# Patient Record
Sex: Male | Born: 1945 | Race: White | Hispanic: No | Marital: Married | Smoking: Former smoker
Health system: Southern US, Community
[De-identification: ages and names within clinical notes are randomized; demographics above are authoritative.]

## PROBLEM LIST (undated history)

## (undated) DIAGNOSIS — I2699 Other pulmonary embolism without acute cor pulmonale: Secondary | ICD-10-CM

## (undated) DIAGNOSIS — E119 Type 2 diabetes mellitus without complications: Secondary | ICD-10-CM

## (undated) DIAGNOSIS — C159 Malignant neoplasm of esophagus, unspecified: Secondary | ICD-10-CM

## (undated) HISTORY — PX: KNEE ARTHROPLASTY: SHX992

## (undated) HISTORY — DX: Type 2 diabetes mellitus without complications: E11.9

---

## 2011-09-18 ENCOUNTER — Encounter (HOSPITAL_COMMUNITY): Payer: Self-pay | Admitting: *Deleted

## 2011-09-18 ENCOUNTER — Emergency Department (HOSPITAL_COMMUNITY)
Admission: EM | Admit: 2011-09-18 | Discharge: 2011-09-18 | Disposition: A | Discharge status: Home / Self Care | Payer: Medicaid - Out of State | Attending: Emergency Medicine | Admitting: Emergency Medicine

## 2011-09-18 DIAGNOSIS — K047 Periapical abscess without sinus: Secondary | ICD-10-CM

## 2011-09-18 DIAGNOSIS — K044 Acute apical periodontitis of pulpal origin: Secondary | ICD-10-CM | POA: Insufficient documentation

## 2011-09-18 MED ORDER — CLINDAMYCIN HCL 150 MG PO CAPS: 300.0000 mg | ORAL_CAPSULE | Freq: Four times a day (QID) | ORAL | Status: AC

## 2011-09-18 MED ORDER — HYDROCODONE-ACETAMINOPHEN 5-325 MG PO TABS: ORAL_TABLET | ORAL | Status: AC

## 2011-09-18 MED ORDER — HYDROCODONE-ACETAMINOPHEN 5-325 MG PO TABS
1.0000 | ORAL_TABLET | Freq: Once | ORAL | Status: AC
  Administered 2011-09-18: 1 via ORAL
  Filled 2011-09-18: qty 1

## 2011-09-18 NOTE — ED Notes (Signed)
Patient has a missing upper left tooth which he has been told by a dentist in [place] (where he lives) that it is abscess. Pain is up in the gum. Noting some sinus drainage.

## 2011-09-18 NOTE — Discharge Instructions (Signed)
Please read and follow all provided instructions.  Your diagnoses today include:  1. Dental infection     Tests performed today include:  Vital signs. See below for your results today.   Medications prescribed:   Vicodin (hydrocodone/acetaminophen) - narcotic pain medication  You have been prescribed narcotic pain medication such as Vicodin, Percocet, or Ultram: DO NOT drive or perform any activities that require you to be awake and alert because this medicine can make you drowsy. BE VERY CAREFUL not to take multiple medicines containing Tylenol (also called acetaminophen). Doing so can lead to an overdose which can damage your liver and cause liver failure and possibly death.    Naproxen - anti-inflammatory pain medication  Do not exceed 500mg  naproxen every 12 hours  You have been prescribed an anti-inflammatory medication or NSAID. Take with food. Take smallest effective dose for the shortest duration needed for your pain. Stop taking if you experience stomach pain or vomiting.    Clindamycin - antibiotic for dental infection  You have been prescribed an antibiotic medicine: take the entire course of medicine even if you are feeling better. Stopping early can cause the antibiotic not to work.  Take any prescribed medications only as directed.  Home care instructions:  Follow any educational materials contained in this packet.  Follow-up instructions: Please follow-up with your dentist for further evaluation of your symptoms. If you do not have a dentist or primary care doctor -- see below for referral information.   The exam and treatment you received today has been provided on an emergency basis only. This is not a substitute for complete medical or dental care. If your problem worsens or new symptoms (problems) appear, and you are unable to arrange prompt follow-up care with your dentist, return to this location.  Return instructions:   Please return to the Emergency  Department if you experience worsening symptoms.  Please return if you develop a fever, you develop more swelling in your face or neck, you have trouble breathing or swallowing food.  Please return if you have any other emergent concerns.  Additional Information:  Your vital signs today were: BP 125/87  Pulse 98  Temp(Src) 98.8 F (37.1 C) (Oral)  Resp 20  SpO2 97% If your blood pressure (BP) was elevated above 135/85 this visit, please have this repeated by your doctor within one month. -------------- No Primary Care Doctor Call [facility]  [phone] Other agencies that provide inexpensive medical care    [facility]  [phone]    [facility]  [phone]    [facility]  [phone]    [facility]  [phone]    [facility]  [phone]    [facility]  [phone] -------------- RESOURCE GUIDE:  Dental Problems  Patients with Medicaid: [facility] [phone] [address]                                            [phone] [address] Phone:  [phone]  Phone:  [phone]  If unable to pay or uninsured, contact:  [facility] or [facility]. to become qualified for the adult dental clinic.  Chronic Pain Problems Contact [facility]  [phone] Patients need to be referred by their primary care doctor.  Insufficient Money for Medicine Contact [employer]:  call "211" or [facility] [phone].  Psychological Services [facility]  [phone] [facility]  [phone] [facility]   [phone] (emergency services [phone])  Substance Abuse Resources [facility]  [phone] [facility] [phone] [facility] [phone] Floydene Flock [phone] Residential & Outpatient Substance Abuse Program   [phone]  Abuse/Neglect [facility] Child Abuse Hotline 772-706-3854 [facility] Child Abuse Hotline 210-307-6245 (After Hours)  Emergency Shelter [facility] [phone]  Maternity Homes [facility] at the [place] [facility] [phone] [place] [facility] [phone]  [facility] Resources  [facility]     [employer]                          [facility] Dept. [address] [place]                       [address]      [address]  Blondell Reveal Phone:  [phone]                                   Phone:  [phone]                 Phone:  [phone]  [facility] Phone:  [phone]  [facility] Child Abuse Hotline [phone] (After Hours)

## 2011-09-18 NOTE — ED Provider Notes (Signed)
History     CSN: [id number]  Arrival date & time 09/18/11  1007   First MD Initiated Contact with Patient 09/18/11 1041      Chief Complaint  Patient presents with  . Dental Pain    left upper tooth    (Consider location/radiation/quality/duration/timing/severity/associated sxs/prior treatment) HPI Comments: L 2nd incisor pain x 2 days. Pt reports mild facial swelling. Taking ibuprofen without relief.   Patient is a 66 y.o. male presenting with tooth pain. The history is provided by the patient.  Dental PainPrimary symptoms do not include mouth pain, headaches, fever, shortness of breath or sore throat. The symptoms began 2 days ago. The symptoms are unchanged. The symptoms are new.  Additional symptoms include: facial swelling. Additional symptoms do not include: trouble swallowing and ear pain.    History reviewed. No pertinent past medical history.  History reviewed. No pertinent past surgical history.  No family history on file.  History  Substance Use Topics  . Smoking status: Not on file  . Smokeless tobacco: Not on file  . Alcohol Use: Not on file      Review of Systems  Constitutional: Negative for fever.  HENT: Positive for facial swelling and dental problem. Negative for ear pain, sore throat, trouble swallowing and neck pain.   Respiratory: Negative for shortness of breath and stridor.   Skin: Negative for color change.  Neurological: Negative for headaches.    Allergies  Penicillins and Sulfa antibiotics  Home Medications   Current Outpatient Rx  Name Route Sig Dispense Refill  . IBUPROFEN 800 MG PO TABS Oral Take 800 mg by mouth every 8 (eight) hours as needed.      BP 125/87  Pulse 98  Temp(Src) 98.8 F (37.1 C) (Oral)  Resp 20  SpO2 97%  Physical Exam  Nursing note and vitals reviewed. Constitutional: He is oriented to person, place, and time. He appears well-developed and well-nourished.  HENT:  Head: Normocephalic and atraumatic. No  trismus in the jaw.  Right Ear: Tympanic membrane, external ear and ear canal normal.  Left Ear: Tympanic membrane, external ear and ear canal normal.  Nose: Nose normal.  Mouth/Throat: Uvula is midline, oropharynx is clear and moist and mucous membranes are normal. Abnormal dentition. Dental caries present. No dental abscesses or uvula swelling. No tonsillar abscesses.       Patient with L maxillary tooth pain and tenderness to palpation in area of 2nd incisior. Tooth is broken. Mild  swelling or erythema noted of gums on exam. No gross facial swelling.  Eyes: Pupils are equal, round, and reactive to light.  Neck: Normal range of motion. Neck supple.       No neck swelling or Lugwig's angina  Neurological: He is alert and oriented to person, place, and time.  Skin: Skin is warm and dry.  Psychiatric: He has a normal mood and affect.    ED Course  Procedures (including critical care time)  Labs Reviewed - No data to display No results found.   1. Dental infection     11:11 AM Patient seen and examined. Medications ordered.   Vital signs reviewed and are as follows: Filed Vitals:   09/18/11 1012  BP: 125/87  Pulse: 98  Temp: 98.8 F (37.1 C)  Resp: 20   11:26 AM Patient counseled to take prescribed medications as directed, return with worsening facial or neck swelling, and to follow-up with his dentist as soon as possible.   11:26 AM Patient counseled on  use of narcotic pain medications. Counseled not to combine these medications with others containing tylenol. Urged not to drink alcohol, drive, or perform any other activities that requires focus while taking these medications. The patient verbalizes understanding and agrees with the plan.    MDM  Patient with toothache. Likley small abscess.  Exam unconcerning for Ludwig's angina or other deep tissue infection in neck.  Will treat with clinda (pen allergy) and pain medicine.  Urged patient to follow-up with dentist.            Renne Crigler, PA 09/18/11 1126

## 2011-09-18 NOTE — ED Provider Notes (Signed)
Medical screening examination/treatment/procedure(s) were performed by non-physician practitioner and as supervising physician I was immediately available for consultation/collaboration.  Vasilis Luhman, MD 09/18/11 1521 

## 2014-04-10 DIAGNOSIS — Z23 Encounter for immunization: Secondary | ICD-10-CM | POA: Diagnosis not present

## 2015-04-21 DIAGNOSIS — Z23 Encounter for immunization: Secondary | ICD-10-CM | POA: Diagnosis not present

## 2015-06-15 ENCOUNTER — Encounter: Payer: Self-pay | Admitting: Internal Medicine

## 2015-06-15 ENCOUNTER — Ambulatory Visit (INDEPENDENT_AMBULATORY_CARE_PROVIDER_SITE_OTHER): Payer: Medicare Other | Admitting: Internal Medicine

## 2015-06-15 VITALS — BP 137/74 | HR 64 | Temp 98.2°F | Ht 67.0 in | Wt 214.3 lb

## 2015-06-15 DIAGNOSIS — Z87891 Personal history of nicotine dependence: Secondary | ICD-10-CM | POA: Diagnosis not present

## 2015-06-15 DIAGNOSIS — Z7189 Other specified counseling: Secondary | ICD-10-CM

## 2015-06-15 DIAGNOSIS — Z0000 Encounter for general adult medical examination without abnormal findings: Secondary | ICD-10-CM

## 2015-06-15 DIAGNOSIS — L409 Psoriasis, unspecified: Secondary | ICD-10-CM | POA: Diagnosis not present

## 2015-06-15 DIAGNOSIS — Z1212 Encounter for screening for malignant neoplasm of rectum: Secondary | ICD-10-CM

## 2015-06-15 DIAGNOSIS — Z7689 Persons encountering health services in other specified circumstances: Secondary | ICD-10-CM

## 2015-06-15 DIAGNOSIS — Z6833 Body mass index (BMI) 33.0-33.9, adult: Secondary | ICD-10-CM

## 2015-06-15 DIAGNOSIS — Z794 Long term (current) use of insulin: Secondary | ICD-10-CM | POA: Diagnosis not present

## 2015-06-15 DIAGNOSIS — E119 Type 2 diabetes mellitus without complications: Secondary | ICD-10-CM

## 2015-06-15 DIAGNOSIS — E663 Overweight: Secondary | ICD-10-CM

## 2015-06-15 DIAGNOSIS — R7303 Prediabetes: Secondary | ICD-10-CM | POA: Insufficient documentation

## 2015-06-15 DIAGNOSIS — Z1211 Encounter for screening for malignant neoplasm of colon: Secondary | ICD-10-CM

## 2015-06-15 LAB — POCT GLYCOSYLATED HEMOGLOBIN (HGB A1C): HEMOGLOBIN A1C: 6

## 2015-06-15 LAB — GLUCOSE, CAPILLARY: Glucose-Capillary: 122 mg/dL — ABNORMAL HIGH (ref 65–99)

## 2015-06-15 NOTE — Progress Notes (Signed)
Subjective:   Patient ID: Samuel [name] male   DOB: [birth year] 69 y.o.   MRN: [id number]  HPI: Mr.Samuel [name] is a 69 y.o. man presenting to the clinic today to establish care with a new provider. He is currently feeling well today. His past medical history significant for type 2 diabetes mellitus previously on metformin, bilateral total knee arthroplasty, and intermittent plaque psoriasis. As of now he is taking only over-the-counter supplements and occasional ibuprofen as needed. He was previously followed at treating healthcare system in [place], [place], but has moved out here due to family.  See problem based assessment and plan below for additional details.  Past Medical History  Diagnosis Date  . Diabetes mellitus without complication (HCC)    No current outpatient prescriptions on file.   No current facility-administered medications for this visit.   Family History  Problem Relation Age of Onset  . Hypertension Father   . COPD Father   . COPD Sister   . Diabetes Maternal Grandfather    Social History   Social History  . Marital Status: Divorced    Spouse Name: N/A  . Number of Children: N/A  . Years of Education: N/A   Social History Main Topics  . Smoking status: Former Smoker -- 1.00 packs/day for 15 years    Types: Cigarettes    Quit date: 06/14/1985  . Smokeless tobacco: None  . Alcohol Use: 0.6 - 1.2 oz/week    1-2 Cans of beer per week  . Drug Use: No  . Sexual Activity: Not Currently   Other Topics Concern  . None   Social History Narrative   Review of Systems: Review of Systems  Constitutional: Negative for fever, chills and weight loss.  HENT: Negative for sore throat.   Eyes: Negative for blurred vision and double vision.  Respiratory: Negative for cough and shortness of breath.   Cardiovascular: Negative for chest pain and leg swelling.  Gastrointestinal: Negative for diarrhea and blood in stool.  Genitourinary: Negative for dysuria and frequency.   Musculoskeletal: Negative for falls.  Skin: Positive for itching and rash.  Neurological: Negative for dizziness and headaches.  Endo/Heme/Allergies: Negative for environmental allergies.  Psychiatric/Behavioral: Negative for substance abuse.    Objective:  Physical Exam: Filed Vitals:   06/15/15 0838  BP: 137/74  Pulse: 64  Temp: 98.2 F (36.8 C)  TempSrc: Oral  Height: 5\' 7"  (1.702 m)  Weight: 214 lb 4.8 oz (97.206 kg)  SpO2: 97%   GENERAL- alert, co-operative, NAD HEENT- Atraumatic, PERRL, EOMI, oral mucosa appears moist, few teeth missing with multiple crowns, no carotid bruit, no cervical LN enlargement. CARDIAC- RRR, no murmurs, rubs or gallops. RESP- CTAB, no wheezes or crackles. ABDOMEN- Soft, nontender, no guarding or rebound, normoactive bowel sounds present BACK- Normal curvature, no paraspinal tenderness, no CVA tenderness. NEURO- No obvious Cr N abnormality, strength upper and lower extremities- 5/5, Sensation intact globally EXTREMITIES- pulse 2+, symmetric, no pedal edema, no callus or lesion on feet SKIN- Small patch of approximately 4cm scaly plaque on lateral left ankle PSYCH- Normal mood and affect, appropriate thought content and speech.  Assessment & Plan:

## 2015-06-15 NOTE — Assessment & Plan Note (Addendum)
Patient newly establishing care we will check a baseline set of labs today. On review with his personally reported records he is currently up-to-date on all inoculations. He is not a candidate for lung cancer screening due to remoteness of his smoking history. He has not undergone any form of colorectal cancer screening but was interested in this and had previously been referred for colonoscopy before moving from [place]. He is interested in Hemoccult cards at this visit and these are provided.  He also reports a history of mild complaints about short term memory and trouble multitasking, but his symptoms are not severe enough to prevent work and regular activities. We can assess for quantifiable deficit on mental exam at next visit.   He is also overweight and this will be important to address with a history of diabetes, although to date he has not had any known significant complications.

## 2015-06-15 NOTE — Assessment & Plan Note (Signed)
Assessment: Patient has a history of type 2 diabetes mellitus and was previously taking metformin daily. However he has not been taking metformin for greater than 1 year. Does not check blood glucoses at home. Risk factors include family history and overweight. He is not have any characteristic symptoms of polydipsia, urinary frequency, headache, fatigue, vision changes, or peripheral neuropathy. Foot exam was unremarkable with no callus or skin changes and intact sensation throughout.  Plan: Hemoglobin A1c today

## 2015-06-15 NOTE — Assessment & Plan Note (Signed)
He has intermittent plaques psoriasis with current symptoms on his left shin and a small area that is currently improving. He does not have a known history of associated inflammatory arthritis. He does not currently take any treatment for this. He does think his psoriasis has been less symptomatic since starting on all his dietary supplements.

## 2015-06-15 NOTE — Patient Instructions (Signed)
Today we are testing some blood work for your hemoglobin level, your kidney function, and your hemoglobin A1c, which is a surrogate marker for your average blood sugar over the past 3 months. If any of these results are concerning or abnormal we will call you with this information. Otherwise we will plan to follow up in about 1-2 months to make sure you are doing well.  Your hemoccult cards are a sensitive test for blood in your stool. There are many causes of blood in stool but colorectal cancer is the most concerning of these. Follow the enclosed instructions and you can provide these cards back to us at your next visit or sooner if you wish to.  If you have any questions or need to be seen for a new symptoms please call our clinic at [phone].

## 2015-06-16 LAB — BMP8+ANION GAP
ANION GAP: 16 mmol/L (ref 10.0–18.0)
BUN/Creatinine Ratio: 13 (ref 10–22)
BUN: 14 mg/dL (ref 8–27)
CALCIUM: 9.5 mg/dL (ref 8.6–10.2)
CHLORIDE: 102 mmol/L (ref 96–106)
CO2: 23 mmol/L (ref 18–29)
Creatinine, Ser: 1.05 mg/dL (ref 0.76–1.27)
GFR calc non Af Amer: 72 mL/min/{1.73_m2} (ref >59)
GFR, EST AFRICAN AMERICAN: 83 mL/min/{1.73_m2} (ref >59)
GLUCOSE: 117 mg/dL — AB (ref 65–99)
POTASSIUM: 4.6 mmol/L (ref 3.5–5.2)
Sodium: 141 mmol/L (ref 134–144)

## 2015-06-16 LAB — CBC
HEMATOCRIT: 49.3 % (ref 37.5–51.0)
HEMOGLOBIN: 16.5 g/dL (ref 12.6–17.7)
MCH: 30 pg (ref 26.6–33.0)
MCHC: 33.5 g/dL (ref 31.5–35.7)
MCV: 90 fL (ref 79–97)
Platelets: 263 10*3/uL (ref 150–379)
RBC: 5.5 x10E6/uL (ref 4.14–5.80)
RDW: 13.5 % (ref 12.3–15.4)
WBC: 9.2 10*3/uL (ref 3.4–10.8)

## 2015-06-18 NOTE — Progress Notes (Signed)
Internal Medicine Clinic Attending  I saw and evaluated the patient.  I personally confirmed the key portions of the history and exam documented by Dr. Rice and I reviewed pertinent patient test results.  The assessment, diagnosis, and plan were formulated together and I agree with the documentation in the resident's note.  

## 2016-04-06 DIAGNOSIS — Z23 Encounter for immunization: Secondary | ICD-10-CM | POA: Diagnosis not present

## 2017-03-13 DIAGNOSIS — Z23 Encounter for immunization: Secondary | ICD-10-CM | POA: Diagnosis not present

## 2017-03-28 DIAGNOSIS — Z23 Encounter for immunization: Secondary | ICD-10-CM | POA: Diagnosis not present

## 2021-02-12 ENCOUNTER — Encounter (HOSPITAL_COMMUNITY): Payer: Self-pay | Admitting: Emergency Medicine

## 2021-02-12 ENCOUNTER — Other Ambulatory Visit: Payer: Self-pay

## 2021-02-12 ENCOUNTER — Ambulatory Visit (INDEPENDENT_AMBULATORY_CARE_PROVIDER_SITE_OTHER): Payer: Medicare Other

## 2021-02-12 ENCOUNTER — Telehealth (HOSPITAL_COMMUNITY): Payer: Self-pay | Admitting: Emergency Medicine

## 2021-02-12 ENCOUNTER — Ambulatory Visit (HOSPITAL_COMMUNITY)
Admission: EM | Admit: 2021-02-12 | Discharge: 2021-02-12 | Disposition: A | Discharge status: Home / Self Care | Payer: Medicare Other | Attending: Student | Admitting: Student

## 2021-02-12 DIAGNOSIS — S42295A Other nondisplaced fracture of upper end of left humerus, initial encounter for closed fracture: Secondary | ICD-10-CM

## 2021-02-12 DIAGNOSIS — M25512 Pain in left shoulder: Secondary | ICD-10-CM

## 2021-02-12 DIAGNOSIS — W19XXXA Unspecified fall, initial encounter: Secondary | ICD-10-CM

## 2021-02-12 MED ORDER — HYDROCODONE-IBUPROFEN 7.5-200 MG PO TABS: 1.0000 | ORAL_TABLET | Freq: Four times a day (QID) | ORAL | 0 refills | Status: DC | PRN

## 2021-02-12 MED ORDER — TRAMADOL HCL 50 MG PO TABS: 50.0000 mg | ORAL_TABLET | Freq: Four times a day (QID) | ORAL | 0 refills | Status: DC | PRN

## 2021-02-12 NOTE — Discharge Instructions (Addendum)
-  You have a : Acute impacted appearing left humeral neck fracture without humeral head dislocation -Hydrocodone-ibuprofen for pain, up to every 6 hours as needed. This can cause drowsiness so do not drive after taking. Take with food. Avoid other products containing ibuprofen or NSAIDs.  -Leave your sling on at all times, but you can remove this 2-3 times a day to move your elbow and wrist.  Absolutely no heavy lifting or raising the arm. -Please call Dr. William Hamburger office ([facility]) on Monday 8/15 to schedule follow-up with them. They've already reviewed your case and expect your call.

## 2021-02-12 NOTE — ED Triage Notes (Signed)
Pt presents with left shoulder pain after a fall yesterday.

## 2021-02-12 NOTE — ED Provider Notes (Addendum)
[facility]    CSN: [id number] Arrival date & time: 02/12/21  1258      History   Chief Complaint Chief Complaint  Patient presents with   Fall   Shoulder Injury    HPI Samuel [name] is a 75 y.o. male presenting with L shoulder pain following fall. Medical history diabetes. States Samuel [name] tripped and fell down few stairs and landed directly on the L shoulder 1 day ago. Now with pain and unable to move arm due to this. Denies sensation changes. Denies pain or injury elsewhere. Did not hit head, denies LOC, headaches, dizziness. Samuel [name] is not on a bloodthinner. Denies hip pain, change in bowel or bladder function, hematuria. Ibuprofen providing some relief.  HPI  Past Medical History:  Diagnosis Date   Diabetes mellitus without complication [facility])     Patient Active Problem List   Diagnosis Date Noted   Type 2 diabetes mellitus without complication, without long-term current use of insulin (HCC) 06/15/2015   Preventative health care 06/15/2015   Psoriasis 06/15/2015    Past Surgical History:  Procedure Laterality Date   KNEE ARTHROPLASTY Bilateral        Home Medications    Prior to Admission medications   Medication Sig Start Date End Date Taking? Authorizing Provider  HYDROcodone-ibuprofen (VICOPROFEN) 7.5-200 MG tablet Take 1 tablet by mouth every 6 (six) hours as needed for moderate pain. 02/12/21  Yes Rhys Martini, PA-C    Family History Family History  Problem Relation Age of Onset   Hypertension Father    COPD Father    COPD Sister    Diabetes Maternal Grandfather     Social History Social History   Tobacco Use   Smoking status: Former    Packs/day: 1.00    Years: 15.00    Pack years: 15.00    Types: Cigarettes    Quit date: 06/14/1985    Years since quitting: 35.6   Smokeless tobacco: Never  Substance Use Topics   Alcohol use: Yes    Alcohol/week: 1.0 - 2.0 standard drink    Types: 1 - 2 Cans of beer per week   Drug use: No      Allergies   Penicillins, Sulfa antibiotics, and Procaine   Review of Systems Review of Systems  Musculoskeletal:        L shoulder pain  All other systems reviewed and are negative.   Physical Exam Triage Vital Signs ED Triage Vitals  Enc Vitals Group     BP 02/12/21 1515 (!) 145/85     Pulse Rate 02/12/21 1515 98     Resp 02/12/21 1515 17     Temp 02/12/21 1515 98.9 F (37.2 C)     Temp Source 02/12/21 1515 Oral     SpO2 02/12/21 1515 96 %     Weight --      Height --      Head Circumference --      Peak Flow --      Pain Score 02/12/21 1511 10     Pain Loc --      Pain Edu? --      Excl. in GC? --    No data found.  Updated Vital Signs BP (!) 145/85 (BP Location: Right Arm)   Pulse 98   Temp 98.9 F (37.2 C) (Oral)   Resp 17   SpO2 96%   Visual Acuity Right Eye Distance:   Left Eye Distance:   Bilateral Distance:  Right Eye Near:   Left Eye Near:    Bilateral Near:     Physical Exam Vitals reviewed.  Constitutional:      General: Samuel [name] is not in acute distress.    Appearance: Normal appearance. Samuel [name] is not ill-appearing or diaphoretic.  HENT:     Head: Normocephalic and atraumatic.  Cardiovascular:     Rate and Rhythm: Normal rate and regular rhythm.     Heart sounds: Normal heart sounds.  Pulmonary:     Effort: Pulmonary effort is normal.     Breath sounds: Normal breath sounds.  Musculoskeletal:     Comments: L shoulder- exam limited due to pain. Significantly TTP over proximal humerus. Unable to abduct arm. No clavicular or AC joint abnormality. Sensation intact. No shoulder jointline tenderness. No elbow tenderrness, ROM intact and without pain. Minimal distal radius tendernes without deformity or ecchymosis. Grip strength 5/5, sensation intact, radial pulse 2+, cap refill <2 seconds, no snuffbox tenderness.   Absolutely no other injury, deformity, tenderness. No hip or pelvic instabilty.  Skin:    General: Skin is warm.  Neurological:      General: No focal deficit present.     Mental Status: Samuel [name] is alert and oriented to person, place, and time.  Psychiatric:        Mood and Affect: Mood normal.        Behavior: Behavior normal.        Thought Content: Thought content normal.        Judgment: Judgment normal.     UC Treatments / Results  Labs (all labs ordered are listed, but only abnormal results are displayed) Labs Reviewed - No data to display  EKG   Radiology DG Shoulder Left  Result Date: 02/12/2021 CLINICAL DATA:  Pain after fall EXAM: LEFT SHOULDER - 2+ VIEW COMPARISON:  None. FINDINGS: Moderate AC joint degenerative change. Acute impacted appearing humeral neck fracture. No humeral head dislocation. Advanced degenerative change of left shoulder with pedunculated osteophyte. IMPRESSION: Acute impacted appearing left humeral neck fracture without humeral head dislocation Electronically Signed   By: Jasmine Pang M.D.   On: 02/12/2021 15:50    Procedures Procedures (including critical care time)  Medications Ordered in UC Medications - No data to display  Initial Impression / Assessment and Plan / UC Course  I have reviewed the triage vital signs and the nursing notes.  Pertinent labs & imaging results that were available during my care of the patient were reviewed by me and considered in my medical decision making (see chart for details).     This patient is a very pleasant 75 y.o. year old male presenting with L humeral fracture following fall. Neurovascularly intact.   Xray L arm - Acute impacted appearing left humeral neck fracture without humeral head dislocation.  Placed in sling. Pt is allergic to tylenol. Tramadol for pain.  Spoke with PA Dion Saucier at [facility], who is in agreement to treat with sling and f/u with ortho in one week.   ED return precautions discussed. Patient verbalizes understanding and agreement.   Level 4 for acute complicated injury with prescription drug  management.  Final Clinical Impressions(s) / UC Diagnoses   Final diagnoses:  Other closed nondisplaced fracture of proximal end of left humerus, initial encounter     Discharge Instructions      -You have a : Acute impacted appearing left humeral neck fracture without humeral head dislocation -Hydrocodone-ibuprofen for pain, up to every 6 hours as needed.  This can cause drowsiness so do not drive after taking. Take with food. Avoid other products containing ibuprofen or NSAIDs.  -Leave your sling on at all times, but you can remove this 2-3 times a day to move your elbow and wrist.  Absolutely no heavy lifting or raising the arm. -Please call Dr. William Hamburger office ([facility]) on Monday 8/15 to schedule follow-up with them. They've already reviewed your case and expect your call.     ED Prescriptions     Medication Sig Dispense Auth. Provider   HYDROcodone-ibuprofen (VICOPROFEN) 7.5-200 MG tablet Take 1 tablet by mouth every 6 (six) hours as needed for moderate pain. 20 tablet Rhys Martini, PA-C      I have reviewed the PDMP during this encounter.   Rhys Martini, PA-C 02/12/21 1705    Rhys Martini, PA-C 02/12/21 1736

## 2021-02-12 NOTE — Telephone Encounter (Signed)
RX for Hydrocodone has been cancelled with [facility], pt notified Tramadol has been sent to pharmacy.

## 2021-07-13 DIAGNOSIS — S42202S Unspecified fracture of upper end of left humerus, sequela: Secondary | ICD-10-CM | POA: Diagnosis not present

## 2021-08-13 DIAGNOSIS — S42202S Unspecified fracture of upper end of left humerus, sequela: Secondary | ICD-10-CM | POA: Diagnosis not present

## 2021-09-10 DIAGNOSIS — S42202S Unspecified fracture of upper end of left humerus, sequela: Secondary | ICD-10-CM | POA: Diagnosis not present

## 2021-10-02 ENCOUNTER — Encounter (HOSPITAL_COMMUNITY): Payer: Self-pay

## 2021-10-02 ENCOUNTER — Ambulatory Visit (HOSPITAL_COMMUNITY)
Admission: EM | Admit: 2021-10-02 | Discharge: 2021-10-02 | Disposition: A | Discharge status: Home / Self Care | Payer: Medicare Other | Attending: Urgent Care | Admitting: Urgent Care

## 2021-10-02 ENCOUNTER — Ambulatory Visit (INDEPENDENT_AMBULATORY_CARE_PROVIDER_SITE_OTHER): Payer: Medicare Other

## 2021-10-02 DIAGNOSIS — S62655A Nondisplaced fracture of medial phalanx of left ring finger, initial encounter for closed fracture: Secondary | ICD-10-CM

## 2021-10-02 DIAGNOSIS — S62663A Nondisplaced fracture of distal phalanx of left middle finger, initial encounter for closed fracture: Secondary | ICD-10-CM | POA: Diagnosis not present

## 2021-10-02 DIAGNOSIS — S8001XA Contusion of right knee, initial encounter: Secondary | ICD-10-CM

## 2021-10-02 DIAGNOSIS — S62633A Displaced fracture of distal phalanx of left middle finger, initial encounter for closed fracture: Secondary | ICD-10-CM

## 2021-10-02 DIAGNOSIS — M7989 Other specified soft tissue disorders: Secondary | ICD-10-CM | POA: Diagnosis not present

## 2021-10-02 MED ORDER — NAPROXEN 375 MG PO TABS: 375.0000 mg | ORAL_TABLET | Freq: Two times a day (BID) | ORAL | 0 refills | Status: AC

## 2021-10-02 MED ORDER — TRAMADOL HCL 50 MG PO TABS: 50.0000 mg | ORAL_TABLET | Freq: Four times a day (QID) | ORAL | 0 refills | Status: AC | PRN

## 2021-10-02 NOTE — Discharge Instructions (Addendum)
You have 2 fractures, 1 to the tip of your middle finger, and 1 to the middle of your ring finger. ?Please wear the finger splints until you see orthopedics. ?Call them on Monday to schedule follow-up. ?Use naproxen twice daily with food for mild pain. ?You may take tramadol as needed for more severe pain.  Side effects include constipation and drowsiness. ?Ice your knee and monitor any new symptoms ?

## 2021-10-02 NOTE — ED Triage Notes (Addendum)
Pt c/o having a fall today and has pain to right knee (able to ambulate and move extremity), and swelling to left 3rd & 4th fingers (pt unable to move fingers, there is bruising and swelling to the area). Pt denies hitting his head.  ?Pt states he had motrin (400mg ) about 2 hours ago.  ?

## 2021-10-02 NOTE — ED Provider Notes (Signed)
?[facility] ? ? ? ?CSN: [id number] ?Arrival date & time: 10/02/21  1640 ? ? ?  ? ?History   ?Chief Complaint ?Chief Complaint  ?Patient presents with  ? Fall  ? Finger Injury  ? Knee Pain  ? ? ?HPI ?Samuel [name] is a 76 y.o. male.  ? ?Pleasant 76 year old male presents today due to concerns of left hand pain status post a fall.  He states he was "trying to be superman", and was carrying too many things up the stairs.  He states he tripped on the last step and fell forward, landing on the top landing.  He bumped his right knee on the lip of the stair.  He states it hurts mildly, but denies any swelling, bruising.  He has full range of motion of his right knee and is able to fully ambulate.  His main concern is his left hand.  He states there is swelling and bruising to his third and fourth digit.  He reports a 6-8 out of 10 pain, following 2 ibuprofens. ? ? ?Fall ? ?Knee Pain ? ?Past Medical History:  ?Diagnosis Date  ? Diabetes mellitus without complication (HCC)   ? ? ?Patient Active Problem List  ? Diagnosis Date Noted  ? Type 2 diabetes mellitus without complication, without long-term current use of insulin (HCC) 06/15/2015  ? Preventative health care 06/15/2015  ? Psoriasis 06/15/2015  ? ? ?Past Surgical History:  ?Procedure Laterality Date  ? KNEE ARTHROPLASTY Bilateral   ? ? ? ? ? ?Home Medications   ? ?Prior to Admission medications   ?Medication Sig Start Date End Date Taking? Authorizing Provider  ?naproxen (NAPROSYN) 375 MG tablet Take 1 tablet (375 mg total) by mouth 2 (two) times daily with a meal for 7 days. 10/02/21 10/09/21 Yes Ariadne Rissmiller L, PA  ?traMADol (ULTRAM) 50 MG tablet Take 1 tablet (50 mg total) by mouth every 6 (six) hours as needed for up to 3 days for severe pain. 10/02/21 10/05/21  Maretta Beesrain, Anamaria Dusenbury L, PA  ? ? ?Family History ?Family History  ?Problem Relation Age of Onset  ? Hypertension Father   ? COPD Father   ? COPD Sister   ? Diabetes Maternal Grandfather   ? ? ?Social  History ?Social History  ? ?Tobacco Use  ? Smoking status: Former  ?  Packs/day: 1.00  ?  Years: 15.00  ?  Pack years: 15.00  ?  Types: Cigarettes  ?  Quit date: 06/14/1985  ?  Years since quitting: 36.3  ? Smokeless tobacco: Never  ?Substance Use Topics  ? Alcohol use: Yes  ?  Alcohol/week: 1.0 - 2.0 standard drink  ?  Types: 1 - 2 Cans of beer per week  ? Drug use: No  ? ? ? ?Allergies   ?Penicillins, Sulfa antibiotics, and Procaine ? ? ?Review of Systems ?Review of Systems  ?Musculoskeletal:  Positive for arthralgias (3rd and 4th digit on L hand).  ?As per hpi ? ?Physical Exam ?Triage Vital Signs ?ED Triage Vitals  ?Enc Vitals Group  ?   BP 10/02/21 1812 133/75  ?   Pulse Rate 10/02/21 1812 80  ?   Resp 10/02/21 1812 20  ?   Temp 10/02/21 1812 99.1 ?F (37.3 ?C)  ?   Temp Source 10/02/21 1812 Oral  ?   SpO2 10/02/21 1812 95 %  ?   Weight --   ?   Height --   ?   Head Circumference --   ?  Peak Flow --   ?   Pain Score 10/02/21 1810 6  ?   Pain Loc --   ?   Pain Edu? --   ?   Excl. in GC? --   ? ?No data found. ? ?Updated Vital Signs ?BP 133/75 (BP Location: Right Arm)   Pulse 80   Temp 99.1 ?F (37.3 ?C) (Oral)   Resp 20   SpO2 95%  ? ?Visual Acuity ?Right Eye Distance:   ?Left Eye Distance:   ?Bilateral Distance:   ? ?Right Eye Near:   ?Left Eye Near:    ?Bilateral Near:    ? ?Physical Exam ?Vitals and nursing note reviewed.  ?Constitutional:   ?   General: He is not in acute distress. ?   Appearance: Normal appearance. He is normal weight. He is not toxic-appearing.  ?HENT:  ?   Head: Normocephalic and atraumatic.  ?Musculoskeletal:     ?   General: Swelling (to 4th middle phalanx and 3rd distal phalanx L hand), tenderness (to 4th middle phalanx and 3rd distal phalanx L hand) and signs of injury present.  ?   Comments: 3rd distal phalanx on L hand and 4th middle phalanx on L hand ecchymotic and swollen. FROM to all digits. ?Tenderness to 3rd distal phalanx on L hand and 4th middle phalanx on L hand. ? ?FROM  of knee. ?No ecchymosis, abrasion or swelling. ?No pain to palpation of patella. ?Normal gait  ?Skin: ?   General: Skin is warm.  ?   Capillary Refill: Capillary refill takes less than 2 seconds.  ?   Findings: No erythema or rash.  ?Neurological:  ?   General: No focal deficit present.  ?   Mental Status: He is alert and oriented to person, place, and time.  ? ? ? ?UC Treatments / Results  ?Labs ?(all labs ordered are listed, but only abnormal results are displayed) ?Labs Reviewed - No data to display ? ?EKG ? ? ?Radiology ?DG Hand Complete Left ? ?Result Date: 10/02/2021 ?CLINICAL DATA:  Fall. EXAM: LEFT HAND - COMPLETE 3+ VIEW COMPARISON:  None. 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 There is an acute transverse fracture through the proximal aspect of the fourth middle phalanx. This is mildly impacted. There is no dislocation. There is surrounding soft tissue swelling. There is an acute comminuted nondisplaced fracture through the base of the third distal phalanx, nondisplaced. There is no significant surrounding soft tissue abnormality. IMPRESSION: 1. Acute fracture proximal aspect of the fourth middle phalanx. 2. Acute fracture base of the third distal phalanx. Electronically Signed   By: Darliss Cheney M.D.   On: 10/02/2021 18:46   ? ?Procedures ?Procedures (including critical care time) ? ?Medications Ordered in UC ?Medications - No data to display ? ?Initial Impression / Assessment and Plan / UC Course  ?I have reviewed the triage vital signs and the nursing notes. ? ?Pertinent labs & imaging results that were available during my care of the patient were reviewed by me and considered in my medical decision making (see chart for details). ? ?  ? ?3rd distal phalanx fx on L hand - comminuted, non displaced. Pt placed in splint and recommended to f/u with ortho. Nsaids for mild pain, tramadol for severe pain ?4th middle phalanx fx of L hand - as above ?Contusion R knee - no concerning findings for patellar fracture.  Stable knee exam, pt ambulating well. F/U with ortho as discussed ? ?Final Clinical Impressions(s) / UC Diagnoses  ? ?  Final diagnoses:  ?Closed nondisplaced fracture of distal phalanx of left middle finger, initial encounter  ?Nondisplaced fracture of middle phalanx of left ring finger, initial encounter for closed fracture  ?Contusion of right knee, initial encounter  ? ? ? ?Discharge Instructions   ? ?  ?You have 2 fractures, 1 to the tip of your middle finger, and 1 to the middle of your ring finger. ?Please wear the finger splints until you see orthopedics. ?Call them on Monday to schedule follow-up. ?Use naproxen twice daily with food for mild pain. ?You may take tramadol as needed for more severe pain.  Side effects include constipation and drowsiness. ?Ice your knee and monitor any new symptoms ? ? ?ED Prescriptions   ? ? Medication Sig Dispense Auth. Provider  ? traMADol (ULTRAM) 50 MG tablet Take 1 tablet (50 mg total) by mouth every 6 (six) hours as needed for up to 3 days for severe pain. 12 tablet Berdia Lachman L, PA  ? naproxen (NAPROSYN) 375 MG tablet Take 1 tablet (375 mg total) by mouth 2 (two) times daily with a meal for 7 days. 14 tablet Hurricane, Daniela Hernan L, PA  ? ?  ? ?I have reviewed the PDMP during this encounter. ?  Maretta Bees, [place] ?10/02/21 1943 ? ?

## 2021-10-11 DIAGNOSIS — S62625A Displaced fracture of medial phalanx of left ring finger, initial encounter for closed fracture: Secondary | ICD-10-CM | POA: Diagnosis not present

## 2021-10-11 DIAGNOSIS — S62663A Nondisplaced fracture of distal phalanx of left middle finger, initial encounter for closed fracture: Secondary | ICD-10-CM | POA: Diagnosis not present

## 2021-10-11 DIAGNOSIS — M79642 Pain in left hand: Secondary | ICD-10-CM | POA: Diagnosis not present

## 2021-10-11 DIAGNOSIS — S42202S Unspecified fracture of upper end of left humerus, sequela: Secondary | ICD-10-CM | POA: Diagnosis not present

## 2021-10-23 LAB — FECAL OCCULT BLOOD, GUAIAC: Fecal Occult Blood: NEGATIVE

## 2021-11-10 ENCOUNTER — Other Ambulatory Visit: Payer: Self-pay

## 2021-11-10 DIAGNOSIS — S42202S Unspecified fracture of upper end of left humerus, sequela: Secondary | ICD-10-CM | POA: Diagnosis not present

## 2021-12-11 DIAGNOSIS — S42202S Unspecified fracture of upper end of left humerus, sequela: Secondary | ICD-10-CM | POA: Diagnosis not present

## 2022-01-10 DIAGNOSIS — S42202S Unspecified fracture of upper end of left humerus, sequela: Secondary | ICD-10-CM | POA: Diagnosis not present

## 2022-02-01 ENCOUNTER — Encounter: Payer: Self-pay | Admitting: Family Medicine

## 2022-02-01 ENCOUNTER — Ambulatory Visit: Payer: Medicare Other | Attending: Family Medicine | Admitting: Family Medicine

## 2022-02-01 VITALS — BP 131/71 | HR 61 | Temp 98.0°F | Ht 66.0 in | Wt 206.2 lb

## 2022-02-01 DIAGNOSIS — I10 Essential (primary) hypertension: Secondary | ICD-10-CM | POA: Diagnosis not present

## 2022-02-01 DIAGNOSIS — R7303 Prediabetes: Secondary | ICD-10-CM | POA: Diagnosis not present

## 2022-02-01 DIAGNOSIS — Z1159 Encounter for screening for other viral diseases: Secondary | ICD-10-CM | POA: Diagnosis not present

## 2022-02-01 DIAGNOSIS — Z130 Encounter for screening for diseases of the blood and blood-forming organs and certain disorders involving the immune mechanism: Secondary | ICD-10-CM

## 2022-02-01 LAB — POCT GLYCOSYLATED HEMOGLOBIN (HGB A1C): HbA1c, POC (controlled diabetic range): 6.3 % (ref 0.0–7.0)

## 2022-02-01 NOTE — Progress Notes (Signed)
Subjective:  Patient ID: Samuel [name], male    DOB: 08-01-1945  Age: 76 y.o. MRN: [id number]  CC: New Patient (Initial Visit)   HPI Samuel [name] is a 76 y.o. year old male with a history of Prediabetes, bilateral knee arthroscopic surgery, fracture of distal phalanx of the middle finger of the left hand in 10/2021 who presents today to establish care. He has not been to see a Physician in  a while.  Interval History: BP at home was 108/60 and is 131/71 here. His chart reveals a history of Type 2 DM which he declines as he states he has never been diabetic and I do not see an A1c of 6.5 or above in his chart He currently does not take any chronic medications. Endorses exercising regularly. Denies additional concerns.  Past Medical History:  Diagnosis Date   Diabetes mellitus without complication (HCC)     Past Surgical History:  Procedure Laterality Date   KNEE ARTHROPLASTY Bilateral     Family History  Problem Relation Age of Onset   Hypertension Father    COPD Father    COPD Sister    Diabetes Maternal Grandfather     Social History   Socioeconomic History   Marital status: Divorced    Spouse name: Not on file   Number of children: Not on file   Years of education: Not on file   Highest education level: Not on file  Occupational History   Not on file  Tobacco Use   Smoking status: Former    Packs/day: 1.00    Years: 15.00    Total pack years: 15.00    Types: Cigarettes    Quit date: 06/14/1985    Years since quitting: 36.6   Smokeless tobacco: Never  Substance and Sexual Activity   Alcohol use: Yes    Alcohol/week: 1.0 - 2.0 standard drink of alcohol    Types: 1 - 2 Cans of beer per week   Drug use: No   Sexual activity: Not Currently  Other Topics Concern   Not on file  Social History Narrative   Not on file   Social Determinants of Health   Financial Resource Strain: Not on file  Food Insecurity: Not on file  Transportation Needs: Not on file   Physical Activity: Not on file  Stress: Not on file  Social Connections: Not on file    Allergies  Allergen Reactions   Penicillins     Shortness of breath   Sulfa Antibiotics     Blisters    Procaine     No outpatient medications prior to visit.   No facility-administered medications prior to visit.     ROS Review of Systems  Constitutional:  Negative for activity change and appetite change.  HENT:  Negative for sinus pressure and sore throat.   Respiratory:  Negative for chest tightness, shortness of breath and wheezing.   Cardiovascular:  Negative for chest pain and palpitations.  Gastrointestinal:  Negative for abdominal distention, abdominal pain and constipation.  Genitourinary: Negative.   Musculoskeletal: Negative.   Psychiatric/Behavioral:  Negative for behavioral problems and dysphoric mood.     Objective:  BP 131/71   Pulse 61   Temp 98 F (36.7 C)   Ht 5\' 6"  (1.676 m)   Wt 206 lb 3.2 oz (93.5 kg)   SpO2 95%   BMI 33.28 kg/m      02/01/2022    2:36 PM 02/01/2022    1:48 PM 10/02/2021  6:12 PM  BP/Weight  Systolic BP 131 148 133  Diastolic BP 71 64 75  Wt. (Lbs)  206.2   BMI  33.28 kg/m2       Physical Exam Constitutional:      Appearance: He is well-developed.  Cardiovascular:     Rate and Rhythm: Normal rate.     Heart sounds: Normal heart sounds. No murmur heard. Pulmonary:     Effort: Pulmonary effort is normal.     Breath sounds: Normal breath sounds. No wheezing or rales.  Chest:     Chest wall: No tenderness.  Abdominal:     General: Bowel sounds are normal. There is no distension.     Palpations: Abdomen is soft. There is no mass.     Tenderness: There is no abdominal tenderness.  Musculoskeletal:        General: Normal range of motion.     Right lower leg: No edema.     Left lower leg: No edema.  Neurological:     Mental Status: He is alert and oriented to person, place, and time.  Psychiatric:        Mood and Affect:  Mood normal.        Latest Ref Rng & Units 06/15/2015    9:41 AM  CMP  Glucose 65 - 99 mg/dL 161   BUN 8 - 27 mg/dL 14   Creatinine 0.96 - 1.27 mg/dL 0.45   Sodium 409 - 811 mmol/L 141   Potassium 3.5 - 5.2 mmol/L 4.6   Chloride 96 - 106 mmol/L 102   CO2 18 - 29 mmol/L 23   Calcium 8.6 - 10.2 mg/dL 9.5     Lipid Panel  No results found for: "CHOL", "TRIG", "HDL", "CHOLHDL", "VLDL", "LDLCALC", "LDLDIRECT"  CBC    Component Value Date/Time   WBC 9.2 06/15/2015 0941   RBC 5.50 06/15/2015 0941   HGB 16.5 06/15/2015 0941   HCT 49.3 06/15/2015 0941   PLT 263 06/15/2015 0941   MCV 90 06/15/2015 0941   MCH 30.0 06/15/2015 0941   MCHC 33.5 06/15/2015 0941   RDW 13.5 06/15/2015 0941    Lab Results  Component Value Date   HGBA1C 6.3 02/01/2022    Assessment & Plan:  1. Prediabetes Labs reveal prediabetes with an A1c of 6.3.  Working on a low carbohydrate diet, exercise, weight loss is recommended in order to prevent progression to type 2 diabetes mellitus.  - POCT glycosylated hemoglobin (Hb A1C) - CMP14+EGFR  2. Primary hypertension Diet controlled Counseled on blood pressure goal of less than 130/80, low-sodium, DASH diet, medication compliance, 150 minutes of moderate intensity exercise per week. Discussed medication compliance, adverse effects.   3. Need for hepatitis C screening test  - HCV Ab w Reflex to Quant PCR  4. Screening for deficiency anemia  - CBC with Differential/Platelet   Health Care Maintenance: he states he is up to date on Pneumonia, Tdap, Shingrix which he received at [facility].  No orders of the defined types were placed in this encounter.   Follow-up: Return in about 6 months (around 08/04/2022) for Chronic medical conditions.       [name], MD, FAAFP. [facility] [place], [place] [phone]   02/01/2022, 7:51 PM

## 2022-02-01 NOTE — Progress Notes (Signed)
No concerns. 

## 2022-02-01 NOTE — Patient Instructions (Addendum)

## 2022-02-02 LAB — CMP14+EGFR
ALT: 41 IU/L (ref 0–44)
AST: 43 IU/L — ABNORMAL HIGH (ref 0–40)
Albumin/Globulin Ratio: 2 (ref 1.2–2.2)
Albumin: 4.2 g/dL (ref 3.8–4.8)
Alkaline Phosphatase: 58 IU/L (ref 44–121)
BUN/Creatinine Ratio: 17 (ref 10–24)
BUN: 15 mg/dL (ref 8–27)
Bilirubin Total: 0.7 mg/dL (ref 0.0–1.2)
CO2: 24 mmol/L (ref 20–29)
Calcium: 10 mg/dL (ref 8.6–10.2)
Chloride: 102 mmol/L (ref 96–106)
Creatinine, Ser: 0.89 mg/dL (ref 0.76–1.27)
Globulin, Total: 2.1 g/dL (ref 1.5–4.5)
Glucose: 113 mg/dL — ABNORMAL HIGH (ref 70–99)
Potassium: 4.1 mmol/L (ref 3.5–5.2)
Sodium: 141 mmol/L (ref 134–144)
Total Protein: 6.3 g/dL (ref 6.0–8.5)
eGFR: 89 mL/min/{1.73_m2} (ref >59)

## 2022-02-02 LAB — CBC WITH DIFFERENTIAL/PLATELET
Basophils Absolute: 0.1 10*3/uL (ref 0.0–0.2)
Basos: 1 %
EOS (ABSOLUTE): 0.3 10*3/uL (ref 0.0–0.4)
Eos: 3 %
Hematocrit: 46 % (ref 37.5–51.0)
Hemoglobin: 15.5 g/dL (ref 13.0–17.7)
Immature Grans (Abs): 0 10*3/uL (ref 0.0–0.1)
Immature Granulocytes: 0 %
Lymphocytes Absolute: 2.8 10*3/uL (ref 0.7–3.1)
Lymphs: 31 %
MCH: 29.8 pg (ref 26.6–33.0)
MCHC: 33.7 g/dL (ref 31.5–35.7)
MCV: 89 fL (ref 79–97)
Monocytes Absolute: 1 10*3/uL — ABNORMAL HIGH (ref 0.1–0.9)
Monocytes: 11 %
Neutrophils Absolute: 4.8 10*3/uL (ref 1.4–7.0)
Neutrophils: 54 %
Platelets: 250 10*3/uL (ref 150–450)
RBC: 5.2 x10E6/uL (ref 4.14–5.80)
RDW: 13 % (ref 11.6–15.4)
WBC: 8.9 10*3/uL (ref 3.4–10.8)

## 2022-02-02 LAB — HCV AB W REFLEX TO QUANT PCR: HCV Ab: NONREACTIVE

## 2022-02-02 LAB — HCV INTERPRETATION

## 2022-02-10 DIAGNOSIS — S42202S Unspecified fracture of upper end of left humerus, sequela: Secondary | ICD-10-CM | POA: Diagnosis not present

## 2022-03-13 DIAGNOSIS — S42202S Unspecified fracture of upper end of left humerus, sequela: Secondary | ICD-10-CM | POA: Diagnosis not present

## 2022-04-12 DIAGNOSIS — S42202S Unspecified fracture of upper end of left humerus, sequela: Secondary | ICD-10-CM | POA: Diagnosis not present

## 2022-04-23 DIAGNOSIS — S42202S Unspecified fracture of upper end of left humerus, sequela: Secondary | ICD-10-CM | POA: Diagnosis not present

## 2022-05-10 DIAGNOSIS — H6121 Impacted cerumen, right ear: Secondary | ICD-10-CM | POA: Diagnosis not present

## 2022-05-10 DIAGNOSIS — B349 Viral infection, unspecified: Secondary | ICD-10-CM | POA: Diagnosis not present

## 2022-05-10 DIAGNOSIS — Z03818 Encounter for observation for suspected exposure to other biological agents ruled out: Secondary | ICD-10-CM | POA: Diagnosis not present

## 2022-05-13 DIAGNOSIS — S42202S Unspecified fracture of upper end of left humerus, sequela: Secondary | ICD-10-CM | POA: Diagnosis not present

## 2022-08-09 ENCOUNTER — Encounter: Payer: Self-pay | Admitting: Family Medicine

## 2022-08-09 ENCOUNTER — Ambulatory Visit: Payer: 59 | Attending: Family Medicine | Admitting: Family Medicine

## 2022-08-09 VITALS — BP 136/80 | HR 52 | Temp 98.0°F | Ht 66.0 in | Wt 203.4 lb

## 2022-08-09 DIAGNOSIS — K08109 Complete loss of teeth, unspecified cause, unspecified class: Secondary | ICD-10-CM

## 2022-08-09 DIAGNOSIS — R7303 Prediabetes: Secondary | ICD-10-CM

## 2022-08-09 LAB — POCT GLYCOSYLATED HEMOGLOBIN (HGB A1C): HbA1c, POC (prediabetic range): 6.2 % (ref 5.7–6.4)

## 2022-08-09 NOTE — Patient Instructions (Signed)
Preventive Dental Care, Adult Preventive dental care is any dental-related procedure or treatment that can prevent dental or other health problems in the future. Preventive dental care begins at birth and continues for a lifetime. This care includes seeing a dental care provider regularly and practicing good dental care (oral hygiene) at home. These actions can help to prevent cavities, root canal problems, gum disease (gingivitis), tooth loss, and other tooth problems. Regular dental exams may also help your health care provider diagnose other medical problems. Many diseases, including mouth cancers, have early signs that can be found during a preventive dental care visit. Schedule an appointment to see a dental care provider at least one time each year for preventive dental care. What can I expect for my preventive dental care visit? Counseling At your visit, your dental care provider may ask you about: Your brushing and flossing habits. Your overall health and diet. Any new symptoms, such as: Bleeding gums. Mouth, tooth, or jaw pain. Dull headache. Using a mouthguard for sports or because of teeth clenching or grinding. The need or desire to get braces to straighten teeth (orthodontic care). Physical exam Your dental care provider will do an oral (mouth) exam to check for: Jaw or other tooth problems. Gum disease or tooth decay. Signs of teeth grinding. Discolored teeth or enamel erosion. Abnormal jaw movement or pain in the jaw joint. Neck swelling or lumps. Signs of cancer. Other services You may also have: Dental X-rays. Cavities filled. Your teeth cleaned. Follow these instructions at home: Oral health     Make sure you brush your teeth with an appropriate-sized, soft-bristled toothbrush with an approved fluoride toothpaste every morning and night. Toothbrushes should be replaced every 3-4 months and if the bristles become frayed. Ask your dental care provider for toothpaste  recommendations. Floss at least once every day. Check your teeth for white or brown spots after brushing. These may be signs of cavities. Check your gums for swelling or bleeding. These may be signs of gum disease. Take over-the-counter and prescription medicines only as told by your dental care provider. Eating and drinking Eat a diet that includes plenty of fruits, vegetables, milk and dairy products, whole grains, and proteins. Do not eat a lot of starchy foods or foods with added sugar. Talk with your health care provider if you have questions about following a healthy diet. Avoid sodas, sugary snacks, and sticky candies. Choose water or milk instead of fruit juice, sodas, or sports drinks. General instructions Do not use any products that contain nicotine or tobacco. These products include cigarettes, chewing tobacco, and vaping devices, such as e-cigarettes. If you need help quitting, ask your health care provider. Do not get mouth piercings. Always wear a mouthguard when playing contact or collision sports. For more information: American Dental Association: www.mouthhealthy.org Contact your dental care provider if you have: Gum, tooth, or jaw pain. Red, swollen, or bleeding gums. A tooth or teeth that are very sensitive to hot or cold. Very bad breath or a dry mouth. A problem with a filling, crown, implant, or denture. A broken or loose tooth. A growth or sore in your mouth that is not going away. What's next? Preventive dental care can help to prevent cavities, root canal problems, gum disease (gingivitis), tooth loss, and other tooth problems. Schedule an appointment to see a dental care provider at least one time each year for preventive dental care. This information is not intended to replace advice given to you by your health care   provider. Make sure you discuss any questions you have with your health care provider. Document Revised: 09/01/2021 Document Reviewed:  02/21/2021 Elsevier Patient Education  2023 Elsevier Inc.  

## 2022-08-09 NOTE — Progress Notes (Signed)
Dental referral

## 2022-08-09 NOTE — Progress Notes (Signed)
Subjective:  Patient ID: Samuel [name], male    DOB: Apr 17, 1946  Age: 77 y.o. MRN: [id number]  CC: Prediabetes   HPI Samuel [name] is a 77 y.o. year old male with a history of Prediabetes, bilateral knee arthroscopic surgery, fracture of distal phalanx of the middle finger of the left hand in 10/2021, PreDM (A1c 6.2).   Interval History:  He uses CBD oil and this helps his bursitis, hand pain.  He is having diffculty eating as he is losing his front teeth and would like to be referred to a dentist.  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  Endorses exercising regularly and for his prediabetes he is on diet control with improvement in A1c down to 6.2 from 6.3 previously.  Past Medical History:  Diagnosis Date   Diabetes mellitus without complication (HCC)     Past Surgical History:  Procedure Laterality Date   KNEE ARTHROPLASTY Bilateral     Family History  Problem Relation Age of Onset   Hypertension Father    COPD Father    COPD Sister    Diabetes Maternal Grandfather     Social History   Socioeconomic History   Marital status: Divorced    Spouse name: Not on file   Number of children: Not on file   Years of education: Not on file   Highest education level: Not on file  Occupational History   Not on file  Tobacco Use   Smoking status: Former    Packs/day: 1.00    Years: 15.00    Total pack years: 15.00    Types: Cigarettes    Quit date: 06/14/1985    Years since quitting: 37.1   Smokeless tobacco: Never  Substance and Sexual Activity   Alcohol use: Yes    Alcohol/week: 1.0 - 2.0 standard drink of alcohol    Types: 1 - 2 Cans of beer per week   Drug use: No   Sexual activity: Not Currently  Other Topics Concern   Not on file  Social History Narrative   Not on file   Social Determinants of Health   Financial Resource Strain: Not on file  Food Insecurity: Not on file  Transportation Needs: Not on file  Physical Activity: Not on  file  Stress: Not on file  Social Connections: Not on file    Allergies  Allergen Reactions   Penicillins     Shortness of breath   Sulfa Antibiotics     Blisters    Procaine     No outpatient medications prior to visit.   No facility-administered medications prior to visit.     ROS Review of Systems  Constitutional:  Negative for activity change and appetite change.  HENT:  Positive for dental problem. Negative for sinus pressure and sore throat.   Respiratory:  Negative for chest tightness, shortness of breath and wheezing.   Cardiovascular:  Negative for chest pain and palpitations.  Gastrointestinal:  Negative for abdominal distention, abdominal pain and constipation.  Genitourinary: Negative.   Musculoskeletal: Negative.   Psychiatric/Behavioral:  Negative for behavioral problems and dysphoric mood.     Objective:  BP 136/80   Pulse (!) 52   Temp 98 F (36.7 C) (Oral)   Ht 5\' 6"  (1.676 m)   Wt 203 lb 6.4 oz (92.3 kg)   SpO2 96%   BMI 32.83 kg/m      08/09/2022    1:47 PM 02/01/2022    2:36 PM  02/01/2022    1:48 PM  BP/Weight  Systolic BP 136 131 148  Diastolic BP 80 71 64  Wt. (Lbs) 203.4  206.2  BMI 32.83 kg/m2  33.28 kg/m2      Physical Exam Constitutional:      Appearance: He is well-developed.  HENT:     Mouth/Throat:     Comments: Loss of upper front incisor Cardiovascular:     Rate and Rhythm: Normal rate.     Heart sounds: Normal heart sounds. No murmur heard. Pulmonary:     Effort: Pulmonary effort is normal.     Breath sounds: Normal breath sounds. No wheezing or rales.  Chest:     Chest wall: No tenderness.  Abdominal:     General: Bowel sounds are normal. There is no distension.     Palpations: Abdomen is soft. There is no mass.     Tenderness: There is no abdominal tenderness.  Musculoskeletal:        General: Normal range of motion.     Right lower leg: No edema.     Left lower leg: No edema.  Neurological:     Mental  Status: He is alert and oriented to person, place, and time.  Psychiatric:        Mood and Affect: Mood normal.        Latest Ref Rng & Units 02/01/2022    2:42 PM 06/15/2015    9:41 AM  CMP  Glucose 70 - 99 mg/dL 811  914   BUN 8 - 27 mg/dL 15  14   Creatinine 7.82 - 1.27 mg/dL 9.56  2.13   Sodium 086 - 144 mmol/L 141  141   Potassium 3.5 - 5.2 mmol/L 4.1  4.6   Chloride 96 - 106 mmol/L 102  102   CO2 20 - 29 mmol/L 24  23   Calcium 8.6 - 10.2 mg/dL 57.8  9.5   Total Protein 6.0 - 8.5 g/dL 6.3    Total Bilirubin 0.0 - 1.2 mg/dL 0.7    Alkaline Phos 44 - 121 IU/L 58    AST 0 - 40 IU/L 43    ALT 0 - 44 IU/L 41      Lipid Panel  No results found for: "CHOL", "TRIG", "HDL", "CHOLHDL", "VLDL", "LDLCALC", "LDLDIRECT"  CBC    Component Value Date/Time   WBC 8.9 02/01/2022 1442   RBC 5.20 02/01/2022 1442   HGB 15.5 02/01/2022 1442   HCT 46.0 02/01/2022 1442   PLT 250 02/01/2022 1442   MCV 89 02/01/2022 1442   MCH 29.8 02/01/2022 1442   MCHC 33.7 02/01/2022 1442   RDW 13.0 02/01/2022 1442   LYMPHSABS 2.8 02/01/2022 1442   EOSABS 0.3 02/01/2022 1442   BASOSABS 0.1 02/01/2022 1442    Lab Results  Component Value Date   HGBA1C 6.2 08/09/2022    Assessment & Plan:  1. Prediabetes Labs reveal prediabetes with an A1c of 6.2.  Working on a low carbohydrate diet, exercise, weight loss is recommended in order to prevent progression to type 2 diabetes mellitus.  - POCT glycosylated hemoglobin (Hb A1C) - LP+Non-HDL Cholesterol; Future - CMP14+EGFR; Future - CBC with Differential/Platelet; Future - CBC with Differential/Platelet - CMP14+EGFR  2. Loss of teeth - Ambulatory referral to Dentistry     No orders of the defined types were placed in this encounter.   Follow-up: Return in about 1 year (around 08/10/2023) for CPE/ Preventive Health Exam.       [name]  Alvis Lemmings, MD, FAAFP. [facility] [place], [place] [phone]    08/09/2022, 5:17 PM

## 2022-08-10 LAB — CMP14+EGFR
ALT: 33 IU/L (ref 0–44)
AST: 36 IU/L (ref 0–40)
Albumin/Globulin Ratio: 1.8 (ref 1.2–2.2)
Albumin: 4.2 g/dL (ref 3.8–4.8)
Alkaline Phosphatase: 55 IU/L (ref 44–121)
BUN/Creatinine Ratio: 15 (ref 10–24)
BUN: 15 mg/dL (ref 8–27)
Bilirubin Total: 0.7 mg/dL (ref 0.0–1.2)
CO2: 24 mmol/L (ref 20–29)
Calcium: 10.1 mg/dL (ref 8.6–10.2)
Chloride: 106 mmol/L (ref 96–106)
Creatinine, Ser: 1.02 mg/dL (ref 0.76–1.27)
Globulin, Total: 2.3 g/dL (ref 1.5–4.5)
Glucose: 120 mg/dL — ABNORMAL HIGH (ref 70–99)
Potassium: 4.6 mmol/L (ref 3.5–5.2)
Sodium: 144 mmol/L (ref 134–144)
Total Protein: 6.5 g/dL (ref 6.0–8.5)
eGFR: 76 mL/min/{1.73_m2} (ref >59)

## 2022-08-10 LAB — CBC WITH DIFFERENTIAL/PLATELET
Basophils Absolute: 0.1 10*3/uL (ref 0.0–0.2)
Basos: 1 %
EOS (ABSOLUTE): 0.4 10*3/uL (ref 0.0–0.4)
Eos: 4 %
Hematocrit: 46 % (ref 37.5–51.0)
Hemoglobin: 15.4 g/dL (ref 13.0–17.7)
Immature Grans (Abs): 0.1 10*3/uL (ref 0.0–0.1)
Immature Granulocytes: 1 %
Lymphocytes Absolute: 2.8 10*3/uL (ref 0.7–3.1)
Lymphs: 28 %
MCH: 29.2 pg (ref 26.6–33.0)
MCHC: 33.5 g/dL (ref 31.5–35.7)
MCV: 87 fL (ref 79–97)
Monocytes Absolute: 1.1 10*3/uL — ABNORMAL HIGH (ref 0.1–0.9)
Monocytes: 11 %
Neutrophils Absolute: 5.5 10*3/uL (ref 1.4–7.0)
Neutrophils: 55 %
Platelets: 281 10*3/uL (ref 150–450)
RBC: 5.27 x10E6/uL (ref 4.14–5.80)
RDW: 12.8 % (ref 11.6–15.4)
WBC: 9.9 10*3/uL (ref 3.4–10.8)

## 2022-08-19 ENCOUNTER — Ambulatory Visit: Payer: 59 | Attending: Family Medicine

## 2022-08-19 DIAGNOSIS — Z0000 Encounter for general adult medical examination without abnormal findings: Secondary | ICD-10-CM

## 2022-08-19 NOTE — Progress Notes (Signed)
Subjective:   Samuel [name] is a 77 y.o. male who presents for Medicare Annual/Subsequent preventive examination.  Review of Systems    connected with  on  at  by telephone and verified that I am speaking with the correct person using two identifiers. I discussed the limitations, risks, security and privacy concerns of performing an evaluation and management service by telephone and the availability of in person appointments. I also discussed with the patient that there may be a patient responsible charge related to this service. The patient expressed understanding and agreed to proceed.  Patient location:   My Location:  Persons on the telephone call:          Objective:    There were no vitals filed for this visit. There is no height or weight on file to calculate BMI.     08/19/2022   11:36 AM 06/15/2015    9:53 AM  Advanced Directives  Does Patient Have a Medical Advance Directive? No No  Would patient like information on creating a medical advance directive?  No - patient declined information    Current Medications (verified) No outpatient encounter medications on file as of 08/19/2022.   No facility-administered encounter medications on file as of 08/19/2022.    Allergies (verified) Penicillins, Sulfa antibiotics, and Procaine   History: Past Medical History:  Diagnosis Date   Diabetes mellitus without complication (HCC)    Past Surgical History:  Procedure Laterality Date   KNEE ARTHROPLASTY Bilateral    Family History  Problem Relation Age of Onset   Hypertension Father    COPD Father    COPD Sister    Diabetes Maternal Grandfather    Social History   Socioeconomic History   Marital status: Divorced    Spouse name: Not on file   Number of children: Not on file   Years of education: Not on file   Highest education level: Not on file  Occupational History   Not on file  Tobacco Use   Smoking status: Former    Packs/day: 1.00    Years: 15.00     Total pack years: 15.00    Types: Cigarettes    Quit date: 06/14/1985    Years since quitting: 37.2   Smokeless tobacco: Never  Substance and Sexual Activity   Alcohol use: Yes    Alcohol/week: 1.0 - 2.0 standard drink of alcohol    Types: 1 - 2 Cans of beer per week   Drug use: No   Sexual activity: Not Currently  Other Topics Concern   Not on file  Social History Narrative   Not on file   Social Determinants of Health   Financial Resource Strain: Low Risk  (08/19/2022)   Overall Financial Resource Strain (CARDIA)    Difficulty of Paying Living Expenses: Not hard at all  Food Insecurity: No Food Insecurity (08/19/2022)   Hunger Vital Sign    Worried About Running Out of Food in the Last Year: Never true    Ran Out of Food in the Last Year: Never true  Transportation Needs: No Transportation Needs (08/19/2022)   PRAPARE - Hydrologist (Medical): No    Lack of Transportation (Non-Medical): No  Physical Activity: Sufficiently Active (08/19/2022)   Exercise Vital Sign    Days of Exercise per Week: 3 days    Minutes of Exercise per Session: 60 min  Stress: No Stress Concern Present (08/19/2022)   [place]  Stress Questionnaire    Feeling of Stress : Not at all  Social Connections: Moderately Integrated (08/19/2022)   Social Connection and Isolation Panel [NHANES]    Frequency of Communication with Friends and Family: More than three times a week    Frequency of Social Gatherings with Friends and Family: More than three times a week    Attends Religious Services: Never    Marine scientist or Organizations: Yes    Attends Music therapist: More than 4 times per year    Marital Status: Married    Tobacco Counseling Counseling given: Not Answered   Clinical Intake:     Pain : No/denies pain     Diabetes: No     Diabetic?no          Activities of Daily Living     08/19/2022   11:38 AM  In your present state of health, do you have any difficulty performing the following activities:  Hearing? 0  Vision? 0  Difficulty concentrating or making decisions? 0  Walking or climbing stairs? 0  Dressing or bathing? 0  Doing errands, shopping? 0  Preparing Food and eating ? N  Using the Toilet? N  In the past six months, have you accidently leaked urine? Y  Do you have problems with loss of bowel control? Y  Managing your Medications? N  Managing your Finances? N  Housekeeping or managing your Housekeeping? N    Patient Care Team: Charlott Rakes, MD as PCP - General (Family Medicine)  Indicate any recent Medical Services you may have received from other than [facility] providers in the past year (date may be approximate).     Assessment:   This is a routine wellness examination for [name].  Hearing/Vision screen No results found.  Dietary issues and exercise activities discussed:     Goals Addressed   None   Depression Screen    08/09/2022    1:49 PM 02/01/2022    2:01 PM 06/15/2015    8:43 AM  PHQ 2/9 Scores  PHQ - 2 Score 0 0 0  PHQ- 9 Score 0 0     Fall Risk    08/19/2022   11:37 AM 08/09/2022    1:48 PM 02/01/2022    1:52 PM 06/15/2015    8:43 AM  [place] in the past year? 1 0 0 No  Number falls in past yr: 1 0 0   Injury with Fall? 1 0 0   Risk for fall due to : Other (Comment)       FALL RISK PREVENTION PERTAINING TO THE HOME:  Any stairs in or around the home? Yes  If so, are there any without handrails? Yes  Home free of loose throw rugs in walkways, pet beds, electrical cords, etc? No  Adequate lighting in your home to reduce risk of falls? Yes   ASSISTIVE DEVICES UTILIZED TO PREVENT FALLS:  Life alert? No  Use of a cane, walker or w/c? Yes  Grab bars in the bathroom? Yes  Shower chair or bench in shower? Yes  Elevated toilet seat or a handicapped toilet? No   TIMED UP AND GO:  Was the test performed? No .   Length of time to ambulate 10 feet:  sec.   Gait slow and steady with assistive device  Cognitive Function:    08/19/2022   11:40 AM  MMSE - Mini Mental State Exam  Orientation to time 5  Orientation to  Place 5  Registration 3  Attention/ Calculation 5  Recall 3  Language- name 2 objects 2  Language- repeat 1  Language- follow 3 step command 3  Language- read & follow direction 1  Write a sentence 1  Copy design 1  Total score 30        08/19/2022   11:44 AM  6CIT Screen  What Year? 0 points  What month? 0 points  What time? 0 points  Count back from 20 0 points  Months in reverse 0 points  Repeat phrase 0 points  Total Score 0 points    Immunizations Immunization History  Administered Date(s) Administered   Influenza-Unspecified 03/07/2020, 04/06/2021, 02/10/2022   MODERNA COVID-19 SARS-COV-2 PEDS BIVALENT BOOSTER 6Y-11Y 08/14/2019, 09/11/2019, 02/24/2022   Moderna Covid-19 Vaccine Bivalent Booster 15yr & up 01/19/2021   PFIZER(Purple Top)SARS-COV-2 Vaccination 04/19/2021   Pneumococcal Polysaccharide-23 04/06/2016   Td 11/05/2004   Tdap 02/29/2012    TDAP status: Due, Education has been provided regarding the importance of this vaccine. Advised may receive this vaccine at local pharmacy or Health Dept. Aware to provide a copy of the vaccination record if obtained from local pharmacy or Health Dept. Verbalized acceptance and understanding.  Flu Vaccine status: Up to date  Pneumococcal vaccine status: Up to date  Covid-19 vaccine status: Information provided on how to obtain vaccines.   Qualifies for Shingles Vaccine? Yes   Zostavax completed Yes   Shingrix Completed?: Yes  Screening Tests Health Maintenance  Topic Date Due   Zoster Vaccines- Shingrix (1 of 2) Never done   Pneumonia Vaccine 77 Years old (2 of 2 - PCV) 04/06/2017   DTaP/Tdap/Td (3 - Td or Tdap) 02/28/2022   COVID-19 Vaccine (6 - 2023-24 season) 04/21/2022   Diabetic kidney  evaluation - Urine ACR  08/10/2027 (Originally 02/19/1964)   HEMOGLOBIN A1C  02/07/2023   Diabetic kidney evaluation - eGFR measurement  08/10/2023   Medicare Annual Wellness (AWV)  08/20/2023   INFLUENZA VACCINE  Completed   Hepatitis C Screening  Completed   HPV VACCINES  Aged Out   FOOT EXAM  Discontinued   OPHTHALMOLOGY EXAM  Discontinued    Health Maintenance  Health Maintenance Due  Topic Date Due   Zoster Vaccines- Shingrix (1 of 2) Never done   Pneumonia Vaccine 77 Years old (2 of 2 - PCV) 04/06/2017   DTaP/Tdap/Td (3 - Td or Tdap) 02/28/2022   COVID-19 Vaccine (6 - 2023-24 season) 04/21/2022    Colorectal cancer screening: No longer required.   Lung Cancer Screening: (Low Dose CT Chest recommended if Age 77-80years, 30 pack-year currently smoking OR have quit w/in 15years.) does not qualify.   Lung Cancer Screening Referral: n/a   Additional Screening:  Hepatitis C Screening: does qualify; Completed 02/01/22  Vision Screening: Recommended annual ophthalmology exams for early detection of glaucoma and other disorders of the eye. Is the patient up to date with their annual eye exam?  No  Who is the provider or what is the name of the office in which the patient attends annual eye exams? [facility] [place] If pt is not established with a provider, would they like to be referred to a provider to establish care?  N/a  .   Dental Screening: Recommended annual dental exams for proper oral hygiene  Community Resource Referral / Chronic Care Management: CRR required this visit?  No   CCM required this visit?  No      Plan:  I have personally reviewed and noted the following in the patient's chart:   Medical and social history Use of alcohol, tobacco or illicit drugs  Current medications and supplements including opioid prescriptions. Patient is not currently taking opioid prescriptions. Functional ability and status Nutritional status Physical  activity Advanced directives List of other physicians Hospitalizations, surgeries, and ER visits in previous 12 months Vitals Screenings to include cognitive, depression, and falls Referrals and appointments  In addition, I have reviewed and discussed with patient certain preventive protocols, quality metrics, and best practice recommendations. A written personalized care plan for preventive services as well as general preventive health recommendations were provided to patient.     Lillie Columbia, CMA   08/19/2022   Nurse Notes:

## 2022-09-06 ENCOUNTER — Other Ambulatory Visit: Payer: 59

## 2022-12-15 ENCOUNTER — Encounter: Payer: Self-pay | Admitting: Family Medicine

## 2022-12-15 ENCOUNTER — Ambulatory Visit: Payer: 59 | Attending: Family Medicine | Admitting: Family Medicine

## 2022-12-15 VITALS — BP 142/81 | HR 64 | Temp 98.0°F | Ht 66.0 in | Wt 204.0 lb

## 2022-12-15 DIAGNOSIS — R030 Elevated blood-pressure reading, without diagnosis of hypertension: Secondary | ICD-10-CM

## 2022-12-15 DIAGNOSIS — R7303 Prediabetes: Secondary | ICD-10-CM | POA: Diagnosis not present

## 2022-12-15 DIAGNOSIS — Z13228 Encounter for screening for other metabolic disorders: Secondary | ICD-10-CM | POA: Diagnosis not present

## 2022-12-15 NOTE — Progress Notes (Signed)
Subjective:  Patient ID: Samuel [name], male    DOB: [birth year]  Age: 77 y.o. MRN: [id number]  CC: Annual Exam   HPI Samuel [name] is a 77 y.o. year old male with a history of Prediabetes, bilateral knee arthroscopic surgery, fracture of distal phalanx of the middle finger of the left hand in 10/2021, PreDM (A1c 6.2).   Interval History:  He had a visit 4 months ago  but was unable to obtain a lipid panel due to the fact that he was not fasting.  He would like his lipid panel done today. He is mindful of the fact that he has prediabetes and is working on a lifestyle to prevent progression to type 2 diabetes mellitus. I had referred him to a dentist at his last visit but he has not had a visit due to the fact that he has been busy.  He states his teeth are not bothering him now so he has decided to hold off. Denies presence of additional concerns today.  Past Medical History:  Diagnosis Date   Diabetes mellitus without complication (HCC)     Past Surgical History:  Procedure Laterality Date   KNEE ARTHROPLASTY Bilateral     Family History  Problem Relation Age of Onset   Hypertension Father    COPD Father    COPD Sister    Diabetes Maternal Grandfather     Social History   Socioeconomic History   Marital status: Divorced    Spouse name: Not on file   Number of children: Not on file   Years of education: Not on file   Highest education level: Not on file  Occupational History   Not on file  Tobacco Use   Smoking status: Former    Packs/day: 1.00    Years: 15.00    Additional pack years: 0.00    Total pack years: 15.00    Types: Cigarettes    Quit date: 06/14/1985    Years since quitting: 37.5   Smokeless tobacco: Never  Substance and Sexual Activity   Alcohol use: Yes    Alcohol/week: 1.0 - 2.0 standard drink of alcohol    Types: 1 - 2 Cans of beer per week   Drug use: No   Sexual activity: Not Currently  Other Topics Concern   Not on file  Social History  Narrative   Not on file   Social Determinants of Health   Financial Resource Strain: Low Risk  (08/19/2022)   Overall Financial Resource Strain (CARDIA)    Difficulty of Paying Living Expenses: Not hard at all  Food Insecurity: No Food Insecurity (08/19/2022)   Hunger Vital Sign    Worried About Running Out of Food in the Last Year: Never true    Ran Out of Food in the Last Year: Never true  Transportation Needs: No Transportation Needs (08/19/2022)   PRAPARE - Administrator, Civil Service (Medical): No    Lack of Transportation (Non-Medical): No  Physical Activity: Sufficiently Active (08/19/2022)   Exercise Vital Sign    Days of Exercise per Week: 3 days    Minutes of Exercise per Session: 60 min  Stress: No Stress Concern Present (08/19/2022)   [employer] of Occupational Health - Occupational Stress Questionnaire    Feeling of Stress : Not at all  Social Connections: Moderately Integrated (08/19/2022)   Social Connection and Isolation Panel [NHANES]    Frequency of Communication with Friends and Family: More than three times a week  Frequency of Social Gatherings with Friends and Family: More than three times a week    Attends Religious Services: Never    Database administrator or Organizations: Yes    Attends Engineer, structural: More than 4 times per year    Marital Status: Married    Allergies  Allergen Reactions   Penicillins     Shortness of breath   Sulfa Antibiotics     Blisters    Procaine     No outpatient medications prior to visit.   No facility-administered medications prior to visit.     ROS Review of Systems  Constitutional:  Negative for activity change and appetite change.  HENT:  Negative for sinus pressure and sore throat.   Respiratory:  Negative for chest tightness, shortness of breath and wheezing.   Cardiovascular:  Negative for chest pain and palpitations.  Gastrointestinal:  Negative for abdominal distention,  abdominal pain and constipation.  Genitourinary: Negative.   Musculoskeletal: Negative.   Psychiatric/Behavioral:  Negative for behavioral problems and dysphoric mood.     Objective:  BP (!) 142/81   Pulse 64   Temp 98 F (36.7 C) (Oral)   Ht 5\' 6"  (1.676 m)   Wt 204 lb (92.5 kg)   SpO2 96%   BMI 32.93 kg/m      12/15/2022   10:47 AM 12/15/2022   10:13 AM 08/09/2022    1:47 PM  BP/Weight  Systolic BP 142 148 136  Diastolic BP 81 79 80  Wt. (Lbs)  204 203.4  BMI  32.93 kg/m2 32.83 kg/m2      Physical Exam Constitutional:      Appearance: He is well-developed.  Cardiovascular:     Rate and Rhythm: Normal rate.     Heart sounds: Normal heart sounds. No murmur heard. Pulmonary:     Effort: Pulmonary effort is normal.     Breath sounds: Normal breath sounds. No wheezing or rales.  Chest:     Chest wall: No tenderness.  Abdominal:     General: Bowel sounds are normal. There is no distension.     Palpations: Abdomen is soft. There is no mass.     Tenderness: There is no abdominal tenderness.  Musculoskeletal:        General: Normal range of motion.     Right lower leg: No edema.     Left lower leg: No edema.  Neurological:     Mental Status: He is alert and oriented to person, place, and time.  Psychiatric:        Mood and Affect: Mood normal.        Latest Ref Rng & Units 08/09/2022    2:25 PM 02/01/2022    2:42 PM 06/15/2015    9:41 AM  CMP  Glucose 70 - 99 mg/dL 258  527  782   BUN 8 - 27 mg/dL 15  15  14    Creatinine 0.76 - 1.27 mg/dL 4.23  5.36  1.44   Sodium 134 - 144 mmol/L 144  141  141   Potassium 3.5 - 5.2 mmol/L 4.6  4.1  4.6   Chloride 96 - 106 mmol/L 106  102  102   CO2 20 - 29 mmol/L 24  24  23    Calcium 8.6 - 10.2 mg/dL 31.5  40.0  9.5   Total Protein 6.0 - 8.5 g/dL 6.5  6.3    Total Bilirubin 0.0 - 1.2 mg/dL 0.7  0.7    Alkaline  Phos 44 - 121 IU/L 55  58    AST 0 - 40 IU/L 36  43    ALT 0 - 44 IU/L 33  41      Lipid Panel  No results  found for: "CHOL", "TRIG", "HDL", "CHOLHDL", "VLDL", "LDLCALC", "LDLDIRECT"  CBC    Component Value Date/Time   WBC 9.9 08/09/2022 1425   RBC 5.27 08/09/2022 1425   HGB 15.4 08/09/2022 1425   HCT 46.0 08/09/2022 1425   PLT 281 08/09/2022 1425   MCV 87 08/09/2022 1425   MCH 29.2 08/09/2022 1425   MCHC 33.5 08/09/2022 1425   RDW 12.8 08/09/2022 1425   LYMPHSABS 2.8 08/09/2022 1425   EOSABS 0.4 08/09/2022 1425   BASOSABS 0.1 08/09/2022 1425    Lab Results  Component Value Date   HGBA1C 6.2 08/09/2022    Assessment & Plan:  1. Prediabetes Labs reveal prediabetes with an A1c of 6.2.  Working on a low carbohydrate diet, exercise, weight loss is recommended in order to prevent progression to type 2 diabetes mellitus.  - Hemoglobin A1c  2. Screening for metabolic disorder - LP+Non-HDL Cholesterol  3. Elevated blood pressure reading in office without diagnosis of hypertension No previous diagnosis of hypertension He will work on lifestyle modification and I will reassess blood pressure at next visit Counseled on blood pressure goal of less than 130/80, low-sodium, DASH diet, medication compliance, 150 minutes of moderate intensity exercise per week. Discussed medication compliance, adverse effects.    No orders of the defined types were placed in this encounter.   Follow-up: Return in about 6 months (around 06/16/2023) for Blood Pressure follow-up.       [name], MD, FAAFP. [facility] [place], [place] [phone]   12/15/2022, 11:07 AM

## 2022-12-15 NOTE — Patient Instructions (Addendum)
Managing Your Hypertension Hypertension, also called high blood pressure, is when the force of the blood pressing against the walls of the arteries is too strong. Arteries are blood vessels that carry blood from your heart throughout your body. Hypertension forces the heart to work harder to pump blood and may cause the arteries to become narrow or stiff. Understanding blood pressure readings A blood pressure reading includes a higher number over a lower number: The first, or top, number is called the systolic pressure. It is a measure of the pressure in your arteries as your heart beats. The second, or bottom number, is called the diastolic pressure. It is a measure of the pressure in your arteries as the heart relaxes. For most people, a normal blood pressure is below 120/80. Your personal target blood pressure may vary depending on your medical conditions, your age, and other factors. Blood pressure is classified into four stages. Based on your blood pressure reading, your health care provider may use the following stages to determine what type of treatment you need, if any. Systolic pressure and diastolic pressure are measured in a unit called millimeters of mercury (mmHg). Normal Systolic pressure: below 120. Diastolic pressure: below 80. Elevated Systolic pressure: 120-129. Diastolic pressure: below 80. Hypertension stage 1 Systolic pressure: 130-139. Diastolic pressure: 80-89. Hypertension stage 2 Systolic pressure: 140 or above. Diastolic pressure: 90 or above. How can this condition affect me? Managing your hypertension is very important. Over time, hypertension can damage the arteries and decrease blood flow to parts of the body, including the brain, heart, and kidneys. Having untreated or uncontrolled hypertension can lead to: A heart attack. A stroke. A weakened blood vessel (aneurysm). Heart failure. Kidney damage. Eye damage. Memory and concentration problems. Vascular  dementia. What actions can I take to manage this condition? Hypertension can be managed by making lifestyle changes and possibly by taking medicines. Your health care provider will help you make a plan to bring your blood pressure within a normal range. You may be referred for counseling on a healthy diet and physical activity. Nutrition  Eat a diet that is high in fiber and potassium, and low in salt (sodium), added sugar, and fat. An example eating plan is called the DASH diet. DASH stands for Dietary Approaches to Stop Hypertension. To eat this way: Eat plenty of fresh fruits and vegetables. Try to fill one-half of your plate at each meal with fruits and vegetables. Eat whole grains, such as whole-wheat pasta, brown rice, or whole-grain bread. Fill about one-fourth of your plate with whole grains. Eat low-fat dairy products. Avoid fatty cuts of meat, processed or cured meats, and poultry with skin. Fill about one-fourth of your plate with lean proteins such as fish, chicken without skin, beans, eggs, and tofu. Avoid pre-made and processed foods. These tend to be higher in sodium, added sugar, and fat. Reduce your daily sodium intake. Many people with hypertension should eat less than 1,500 mg of sodium a day. Lifestyle  Work with your health care provider to maintain a healthy body weight or to lose weight. Ask what an ideal weight is for you. Get at least 30 minutes of exercise that causes your heart to beat faster (aerobic exercise) most days of the week. Activities may include walking, swimming, or biking. Include exercise to strengthen your muscles (resistance exercise), such as weight lifting, as part of your weekly exercise routine. Try to do these types of exercises for 30 minutes at least 3 days a week. Do   not use any products that contain nicotine or tobacco. These products include cigarettes, chewing tobacco, and vaping devices, such as e-cigarettes. If you need help quitting, ask your  health care provider. Control any long-term (chronic) conditions you have, such as high cholesterol or diabetes. Identify your sources of stress and find ways to manage stress. This may include meditation, deep breathing, or making time for fun activities. Alcohol use Do not drink alcohol if: Your health care provider tells you not to drink. You are pregnant, may be pregnant, or are planning to become pregnant. If you drink alcohol: Limit how much you have to: 0-1 drink a day for women. 0-2 drinks a day for men. Know how much alcohol is in your drink. In the [place] one drink equals one 12 oz bottle of beer (355 mL), one 5 oz glass of wine (148 mL), or one 1 oz glass of hard liquor (44 mL). Medicines Your health care provider may prescribe medicine if lifestyle changes are not enough to get your blood pressure under control and if: Your systolic blood pressure is 130 or higher. Your diastolic blood pressure is 80 or higher. Take medicines only as told by your health care provider. Follow the directions carefully. Blood pressure medicines must be taken as told by your health care provider. The medicine does not work as well when you skip doses. Skipping doses also puts you at risk for problems. Monitoring Before you monitor your blood pressure: Do not smoke, drink caffeinated beverages, or exercise within 30 minutes before taking a measurement. Use the bathroom and empty your bladder (urinate). Sit quietly for at least 5 minutes before taking measurements. Monitor your blood pressure at home as told by your health care provider. To do this: Sit with your back straight and supported. Place your feet flat on the floor. Do not cross your legs. Support your arm on a flat surface, such as a table. Make sure your upper arm is at heart level. Each time you measure, take two or three readings one minute apart and record the results. You may also need to have your blood pressure checked regularly by  your health care provider. General information Talk with your health care provider about your diet, exercise habits, and other lifestyle factors that may be contributing to hypertension. Review all the medicines you take with your health care provider because there may be side effects or interactions. Keep all follow-up visits. Your health care provider can help you create and adjust your plan for managing your high blood pressure. Where to find more information National Heart, Lung, and Blood Institute: www.nhlbi.nih.gov American Heart Association: www.heart.org Contact a health care provider if: You think you are having a reaction to medicines you have taken. You have repeated (recurrent) headaches. You feel dizzy. You have swelling in your ankles. You have trouble with your vision. Get help right away if: You develop a severe headache or confusion. You have unusual weakness or numbness, or you feel faint. You have severe pain in your chest or abdomen. You vomit repeatedly. You have trouble breathing. These symptoms may be an emergency. Get help right away. Call 911. Do not wait to see if the symptoms will go away. Do not drive yourself to the hospital. Summary Hypertension is when the force of blood pumping through your arteries is too strong. If this condition is not controlled, it may put you at risk for serious complications. Your personal target blood pressure may vary depending on your medical conditions,   your age, and other factors. For most people, a normal blood pressure is less than 120/80. Hypertension is managed by lifestyle changes, medicines, or both. Lifestyle changes to help manage hypertension include losing weight, eating a healthy, low-sodium diet, exercising more, stopping smoking, and limiting alcohol. This information is not intended to replace advice given to you by your health care provider. Make sure you discuss any questions you have with your health care  provider. Document Revised: 03/04/2021 Document Reviewed: 03/04/2021 Elsevier Patient Education  2024 [employer]  

## 2022-12-16 LAB — LP+NON-HDL CHOLESTEROL
Cholesterol, Total: 190 mg/dL (ref 100–199)
HDL: 59 mg/dL (ref >39)
LDL Chol Calc (NIH): 111 mg/dL — ABNORMAL HIGH (ref 0–99)
Total Non-HDL-Chol (LDL+VLDL): 131 mg/dL — ABNORMAL HIGH (ref 0–129)
Triglycerides: 113 mg/dL (ref 0–149)
VLDL Cholesterol Cal: 20 mg/dL (ref 5–40)

## 2022-12-16 LAB — HEMOGLOBIN A1C
Est. average glucose Bld gHb Est-mCnc: 154 mg/dL
Hgb A1c MFr Bld: 7 % — ABNORMAL HIGH (ref 4.8–5.6)

## 2022-12-19 ENCOUNTER — Other Ambulatory Visit: Payer: Self-pay | Admitting: Family Medicine

## 2022-12-19 ENCOUNTER — Telehealth: Payer: Self-pay

## 2022-12-19 DIAGNOSIS — E119 Type 2 diabetes mellitus without complications: Secondary | ICD-10-CM | POA: Insufficient documentation

## 2022-12-19 DIAGNOSIS — E1169 Type 2 diabetes mellitus with other specified complication: Secondary | ICD-10-CM

## 2022-12-19 MED ORDER — METFORMIN HCL 500 MG PO TABS: 500.0000 mg | ORAL_TABLET | Freq: Every day | ORAL | 1 refills | Status: DC

## 2022-12-19 MED ORDER — ACCU-CHEK GUIDE W/DEVICE KIT: PACK | 0 refills | Status: DC

## 2022-12-19 MED ORDER — ACCU-CHEK GUIDE VI STRP: ORAL_STRIP | 12 refills | Status: DC

## 2022-12-19 NOTE — Telephone Encounter (Signed)
Pt given lab results per notes of Dr. Alvis Lemmings on 12/19/22. Pt verbalized understanding. Pt has scheduled FU OV for 06/22/23. Will go to pharmacy to pu rxs.   [name], MD 12/19/2022  6:47 AM EDT Back to Top    Cholesterol is normal but A1c is 7 with  a new diagnosis of Diabetes. I have sent a Prescription for Metformin to the Pharmacy along  with  testing  supplies. Please have him follow up with me in 6  months. Thanks

## 2022-12-28 IMAGING — DX DG SHOULDER 2+V*L*
3 series · 3 of 3 positions shown · non-contrast
Comparison: None.

CLINICAL DATA: Pain after fall

EXAM:
LEFT SHOULDER - 2+ VIEW

[shoulder ap]
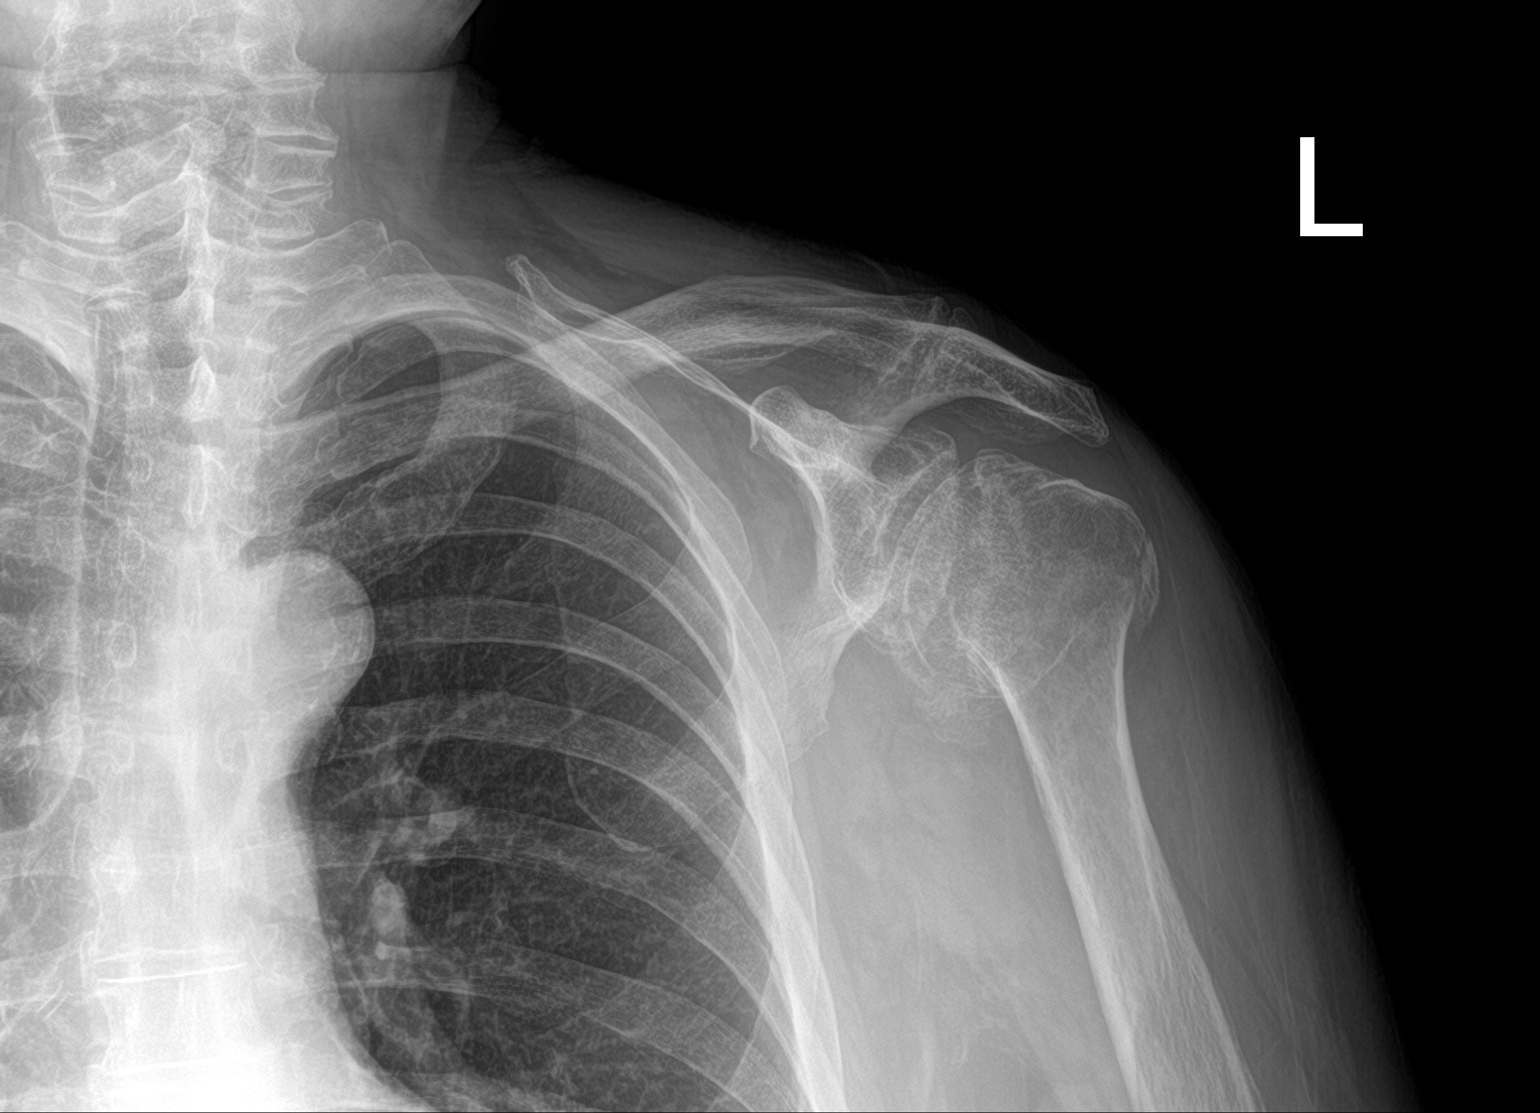

[shoulder grashey]
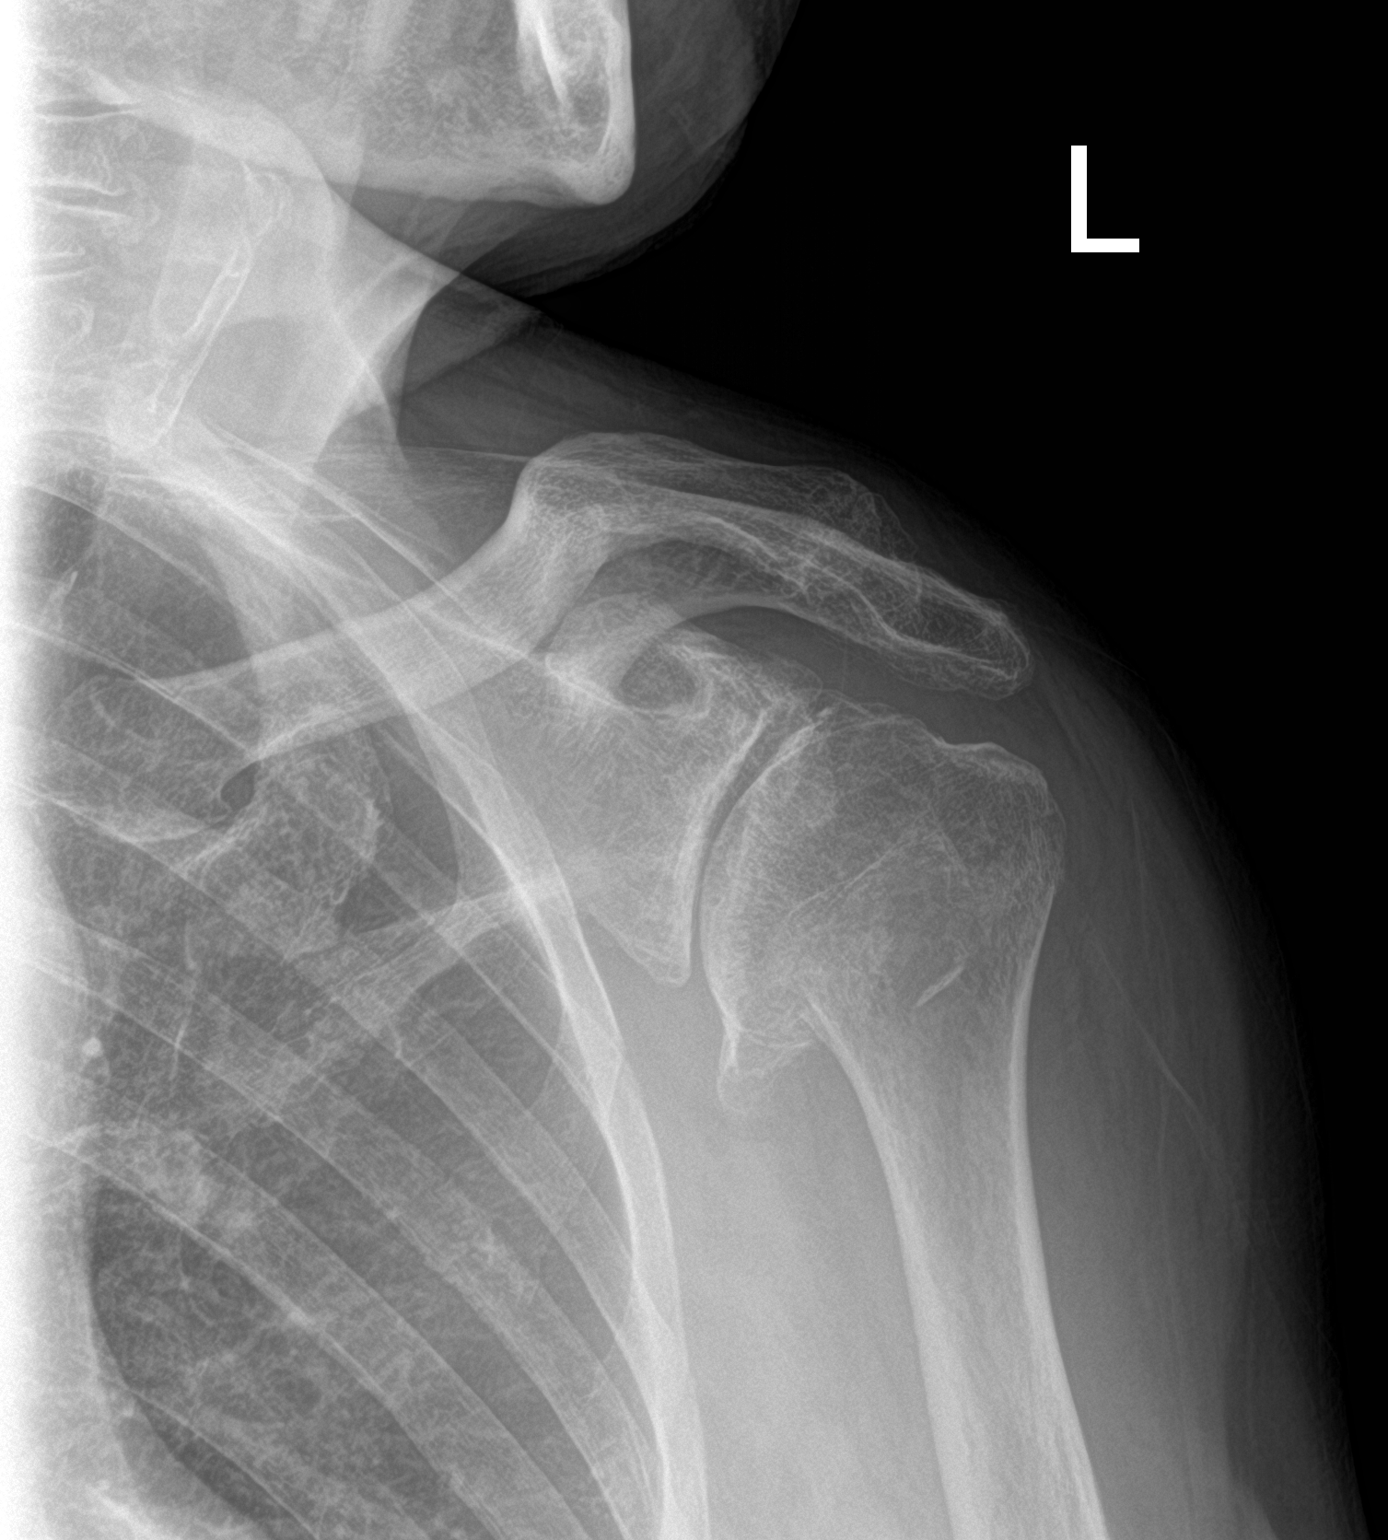

[shoulder y-view]
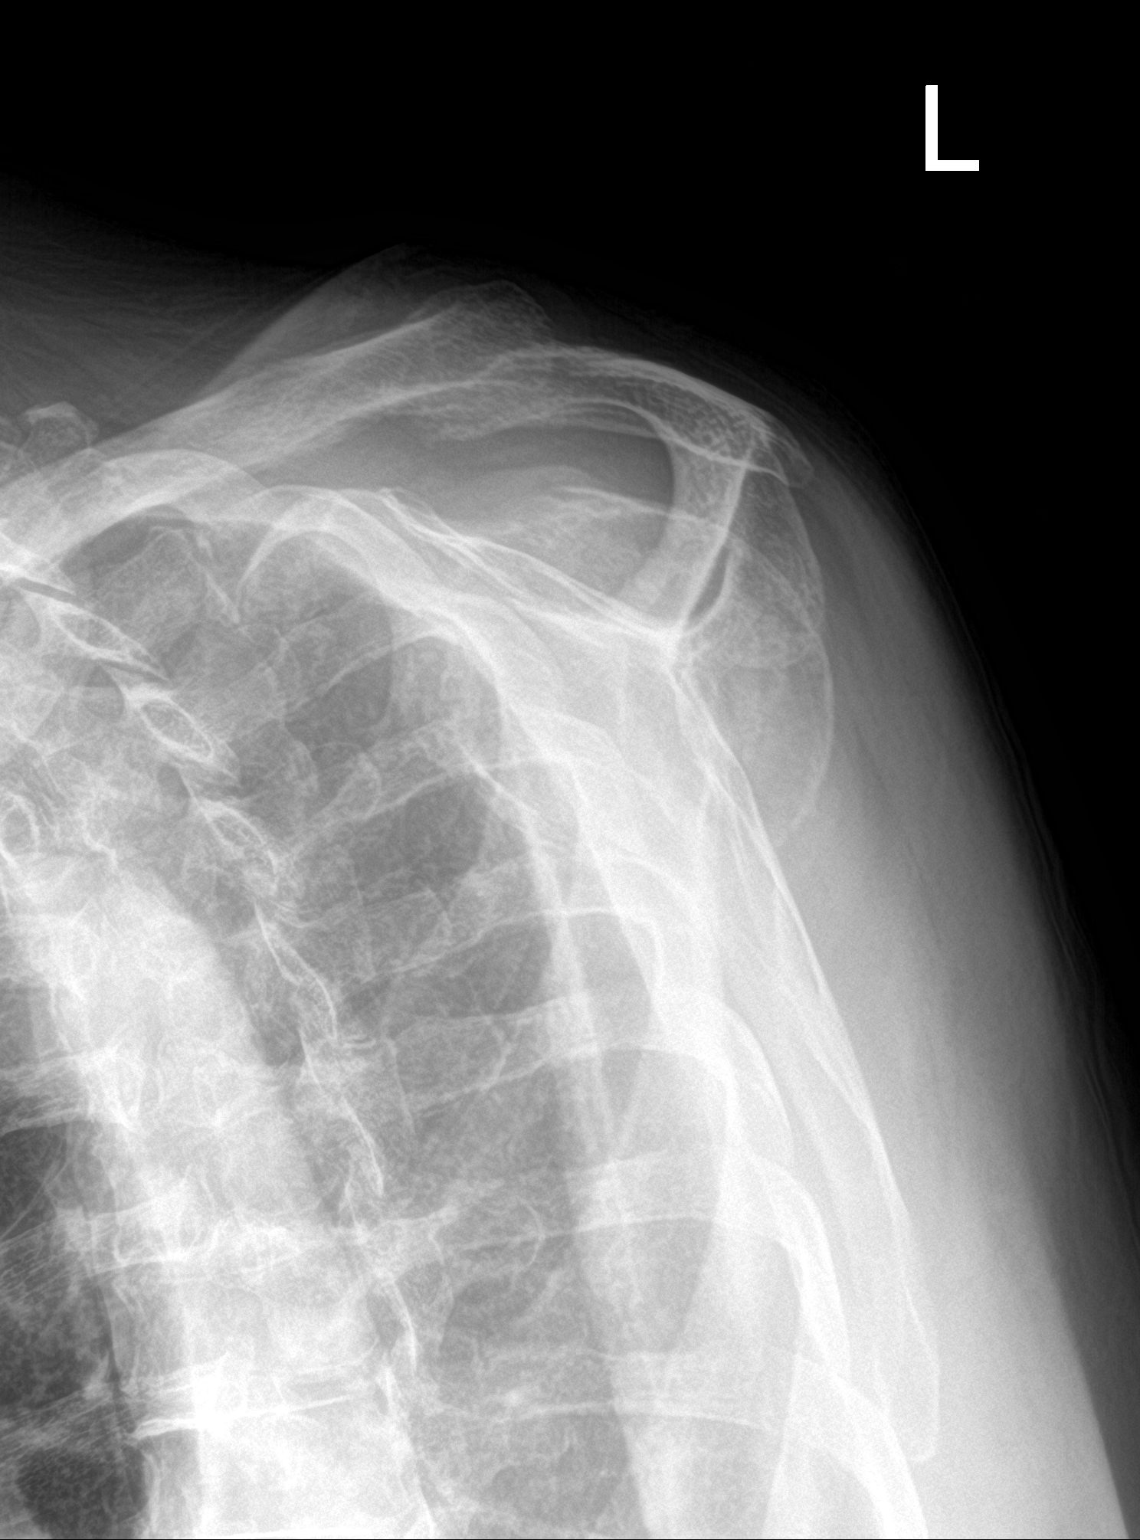

[3 of 3 positions shown; findings below may reference images not displayed]

FINDINGS: Moderate AC joint degenerative change. Acute impacted appearing
humeral neck fracture. No humeral head dislocation. Advanced
degenerative change of left shoulder with pedunculated osteophyte.
IMPRESSION: Acute impacted appearing left humeral neck fracture without humeral
head dislocation

## 2023-01-15 ENCOUNTER — Other Ambulatory Visit: Payer: Self-pay | Admitting: Family Medicine

## 2023-01-16 NOTE — Telephone Encounter (Signed)
Requested medications are due for refill today.  unsure  Requested medications are on the active medications list.  yes  Last refill. 12/19/2022  Future visit scheduled.   yes  Notes to clinic.  Pt was prescribed a new meter 12/19/2022    Requested Prescriptions  Pending Prescriptions Disp Refills   Blood Glucose Monitoring Suppl (ACCU-CHEK GUIDE ME) w/Device KIT [Pharmacy Med Name: ACCU-CHEK GUIDE ME GLUCOSE MTR]      Sig: USE AS DIRECTED 3 TIMES A DAY     Endocrinology: Diabetes - Testing Supplies Passed - 01/15/2023  9:34 AM      Passed - Valid encounter within last 12 months    Recent Outpatient Visits           1 month ago Prediabetes   [facility] [place], [name], MD   5 months ago Prediabetes   [facility] [name], MD   11 months ago Prediabetes   [place] [facility] [name], MD       Future Appointments             In 5 months [name], MD [facility]

## 2023-04-14 ENCOUNTER — Emergency Department (HOSPITAL_COMMUNITY): Payer: 59

## 2023-04-14 ENCOUNTER — Ambulatory Visit: Payer: Self-pay

## 2023-04-14 ENCOUNTER — Other Ambulatory Visit: Payer: Self-pay

## 2023-04-14 ENCOUNTER — Inpatient Hospital Stay (HOSPITAL_COMMUNITY)
Admission: EM | Admit: 2023-04-14 | Discharge: 2023-04-25 | DRG: 175 | Disposition: A | Discharge status: Home / Self Care | Payer: 59 | Attending: Internal Medicine | Admitting: Internal Medicine

## 2023-04-14 ENCOUNTER — Inpatient Hospital Stay (HOSPITAL_COMMUNITY): Payer: 59

## 2023-04-14 DIAGNOSIS — Z7984 Long term (current) use of oral hypoglycemic drugs: Secondary | ICD-10-CM | POA: Diagnosis not present

## 2023-04-14 DIAGNOSIS — N2889 Other specified disorders of kidney and ureter: Secondary | ICD-10-CM | POA: Diagnosis not present

## 2023-04-14 DIAGNOSIS — D751 Secondary polycythemia: Secondary | ICD-10-CM | POA: Diagnosis present

## 2023-04-14 DIAGNOSIS — E0590 Thyrotoxicosis, unspecified without thyrotoxic crisis or storm: Secondary | ICD-10-CM | POA: Diagnosis not present

## 2023-04-14 DIAGNOSIS — R609 Edema, unspecified: Secondary | ICD-10-CM | POA: Diagnosis present

## 2023-04-14 DIAGNOSIS — I672 Cerebral atherosclerosis: Secondary | ICD-10-CM | POA: Diagnosis not present

## 2023-04-14 DIAGNOSIS — E119 Type 2 diabetes mellitus without complications: Secondary | ICD-10-CM | POA: Diagnosis present

## 2023-04-14 DIAGNOSIS — C155 Malignant neoplasm of lower third of esophagus: Secondary | ICD-10-CM | POA: Diagnosis not present

## 2023-04-14 DIAGNOSIS — I2489 Other forms of acute ischemic heart disease: Secondary | ICD-10-CM | POA: Diagnosis present

## 2023-04-14 DIAGNOSIS — R918 Other nonspecific abnormal finding of lung field: Secondary | ICD-10-CM | POA: Diagnosis not present

## 2023-04-14 DIAGNOSIS — R933 Abnormal findings on diagnostic imaging of other parts of digestive tract: Secondary | ICD-10-CM | POA: Diagnosis not present

## 2023-04-14 DIAGNOSIS — D002 Carcinoma in situ of stomach: Secondary | ICD-10-CM | POA: Diagnosis not present

## 2023-04-14 DIAGNOSIS — Z931 Gastrostomy status: Secondary | ICD-10-CM | POA: Diagnosis not present

## 2023-04-14 DIAGNOSIS — E860 Dehydration: Secondary | ICD-10-CM

## 2023-04-14 DIAGNOSIS — K760 Fatty (change of) liver, not elsewhere classified: Secondary | ICD-10-CM | POA: Diagnosis not present

## 2023-04-14 DIAGNOSIS — K5730 Diverticulosis of large intestine without perforation or abscess without bleeding: Secondary | ICD-10-CM | POA: Diagnosis not present

## 2023-04-14 DIAGNOSIS — Z23 Encounter for immunization: Secondary | ICD-10-CM

## 2023-04-14 DIAGNOSIS — Z96653 Presence of artificial knee joint, bilateral: Secondary | ICD-10-CM | POA: Diagnosis present

## 2023-04-14 DIAGNOSIS — C159 Malignant neoplasm of esophagus, unspecified: Secondary | ICD-10-CM

## 2023-04-14 DIAGNOSIS — Z882 Allergy status to sulfonamides status: Secondary | ICD-10-CM

## 2023-04-14 DIAGNOSIS — E876 Hypokalemia: Secondary | ICD-10-CM | POA: Diagnosis present

## 2023-04-14 DIAGNOSIS — Z833 Family history of diabetes mellitus: Secondary | ICD-10-CM

## 2023-04-14 DIAGNOSIS — I10 Essential (primary) hypertension: Secondary | ICD-10-CM | POA: Diagnosis not present

## 2023-04-14 DIAGNOSIS — I4510 Unspecified right bundle-branch block: Secondary | ICD-10-CM | POA: Diagnosis present

## 2023-04-14 DIAGNOSIS — M899 Disorder of bone, unspecified: Secondary | ICD-10-CM | POA: Diagnosis present

## 2023-04-14 DIAGNOSIS — K449 Diaphragmatic hernia without obstruction or gangrene: Secondary | ICD-10-CM | POA: Diagnosis present

## 2023-04-14 DIAGNOSIS — R7989 Other specified abnormal findings of blood chemistry: Secondary | ICD-10-CM

## 2023-04-14 DIAGNOSIS — K2980 Duodenitis without bleeding: Secondary | ICD-10-CM | POA: Diagnosis present

## 2023-04-14 DIAGNOSIS — R001 Bradycardia, unspecified: Secondary | ICD-10-CM

## 2023-04-14 DIAGNOSIS — Z87891 Personal history of nicotine dependence: Secondary | ICD-10-CM

## 2023-04-14 DIAGNOSIS — Z880 Allergy status to penicillin: Secondary | ICD-10-CM

## 2023-04-14 DIAGNOSIS — M7989 Other specified soft tissue disorders: Secondary | ICD-10-CM

## 2023-04-14 DIAGNOSIS — I2699 Other pulmonary embolism without acute cor pulmonale: Principal | ICD-10-CM | POA: Diagnosis present

## 2023-04-14 DIAGNOSIS — D72829 Elevated white blood cell count, unspecified: Secondary | ICD-10-CM | POA: Diagnosis present

## 2023-04-14 DIAGNOSIS — R1319 Other dysphagia: Secondary | ICD-10-CM | POA: Diagnosis not present

## 2023-04-14 DIAGNOSIS — J9811 Atelectasis: Secondary | ICD-10-CM | POA: Diagnosis not present

## 2023-04-14 DIAGNOSIS — I441 Atrioventricular block, second degree: Secondary | ICD-10-CM | POA: Insufficient documentation

## 2023-04-14 DIAGNOSIS — R944 Abnormal results of kidney function studies: Secondary | ICD-10-CM | POA: Diagnosis present

## 2023-04-14 DIAGNOSIS — Z6828 Body mass index (BMI) 28.0-28.9, adult: Secondary | ICD-10-CM

## 2023-04-14 DIAGNOSIS — K8020 Calculus of gallbladder without cholecystitis without obstruction: Secondary | ICD-10-CM | POA: Diagnosis not present

## 2023-04-14 DIAGNOSIS — K829 Disease of gallbladder, unspecified: Secondary | ICD-10-CM | POA: Diagnosis not present

## 2023-04-14 DIAGNOSIS — I82443 Acute embolism and thrombosis of tibial vein, bilateral: Secondary | ICD-10-CM | POA: Diagnosis present

## 2023-04-14 DIAGNOSIS — M1990 Unspecified osteoarthritis, unspecified site: Secondary | ICD-10-CM | POA: Diagnosis present

## 2023-04-14 DIAGNOSIS — Z884 Allergy status to anesthetic agent status: Secondary | ICD-10-CM

## 2023-04-14 DIAGNOSIS — Z8249 Family history of ischemic heart disease and other diseases of the circulatory system: Secondary | ICD-10-CM

## 2023-04-14 DIAGNOSIS — E43 Unspecified severe protein-calorie malnutrition: Secondary | ICD-10-CM | POA: Insufficient documentation

## 2023-04-14 DIAGNOSIS — I700 Atherosclerosis of aorta: Secondary | ICD-10-CM | POA: Diagnosis not present

## 2023-04-14 DIAGNOSIS — N281 Cyst of kidney, acquired: Secondary | ICD-10-CM | POA: Diagnosis present

## 2023-04-14 DIAGNOSIS — K5900 Constipation, unspecified: Secondary | ICD-10-CM | POA: Diagnosis present

## 2023-04-14 DIAGNOSIS — R633 Feeding difficulties, unspecified: Secondary | ICD-10-CM | POA: Diagnosis present

## 2023-04-14 DIAGNOSIS — K838 Other specified diseases of biliary tract: Secondary | ICD-10-CM | POA: Diagnosis not present

## 2023-04-14 DIAGNOSIS — R7401 Elevation of levels of liver transaminase levels: Secondary | ICD-10-CM | POA: Diagnosis not present

## 2023-04-14 DIAGNOSIS — K3189 Other diseases of stomach and duodenum: Secondary | ICD-10-CM | POA: Diagnosis not present

## 2023-04-14 DIAGNOSIS — R531 Weakness: Secondary | ICD-10-CM | POA: Diagnosis not present

## 2023-04-14 DIAGNOSIS — C189 Malignant neoplasm of colon, unspecified: Secondary | ICD-10-CM | POA: Diagnosis not present

## 2023-04-14 DIAGNOSIS — R1310 Dysphagia, unspecified: Secondary | ICD-10-CM | POA: Diagnosis present

## 2023-04-14 DIAGNOSIS — K222 Esophageal obstruction: Secondary | ICD-10-CM | POA: Diagnosis not present

## 2023-04-14 DIAGNOSIS — R7303 Prediabetes: Secondary | ICD-10-CM | POA: Diagnosis not present

## 2023-04-14 DIAGNOSIS — R0789 Other chest pain: Secondary | ICD-10-CM | POA: Diagnosis not present

## 2023-04-14 DIAGNOSIS — R195 Other fecal abnormalities: Secondary | ICD-10-CM | POA: Diagnosis present

## 2023-04-14 LAB — CBG MONITORING, ED: Glucose-Capillary: 88 mg/dL (ref 70–99)

## 2023-04-14 LAB — CBC WITH DIFFERENTIAL/PLATELET
Abs Immature Granulocytes: 0.09 10*3/uL — ABNORMAL HIGH (ref 0.00–0.07)
Basophils Absolute: 0 10*3/uL (ref 0.0–0.1)
Basophils Relative: 0 %
Eosinophils Absolute: 0 10*3/uL (ref 0.0–0.5)
Eosinophils Relative: 0 %
HCT: 53.8 % — ABNORMAL HIGH (ref 39.0–52.0)
Hemoglobin: 18.2 g/dL — ABNORMAL HIGH (ref 13.0–17.0)
Immature Granulocytes: 1 %
Lymphocytes Relative: 18 %
Lymphs Abs: 2.3 10*3/uL (ref 0.7–4.0)
MCH: 29.4 pg (ref 26.0–34.0)
MCHC: 33.8 g/dL (ref 30.0–36.0)
MCV: 86.9 fL (ref 80.0–100.0)
Monocytes Absolute: 1.3 10*3/uL — ABNORMAL HIGH (ref 0.1–1.0)
Monocytes Relative: 10 %
Neutro Abs: 9.4 10*3/uL — ABNORMAL HIGH (ref 1.7–7.7)
Neutrophils Relative %: 71 %
Platelets: 211 10*3/uL (ref 150–400)
RBC: 6.19 MIL/uL — ABNORMAL HIGH (ref 4.22–5.81)
RDW: 13.9 % (ref 11.5–15.5)
WBC: 13.2 10*3/uL — ABNORMAL HIGH (ref 4.0–10.5)
nRBC: 0 % (ref 0.0–0.2)

## 2023-04-14 LAB — COMPREHENSIVE METABOLIC PANEL
ALT: 91 U/L — ABNORMAL HIGH (ref 0–44)
AST: 82 U/L — ABNORMAL HIGH (ref 15–41)
Albumin: 3.7 g/dL (ref 3.5–5.0)
Alkaline Phosphatase: 57 U/L (ref 38–126)
Anion gap: 15 (ref 5–15)
BUN: 26 mg/dL — ABNORMAL HIGH (ref 8–23)
CO2: 21 mmol/L — ABNORMAL LOW (ref 22–32)
Calcium: 9.4 mg/dL (ref 8.9–10.3)
Chloride: 101 mmol/L (ref 98–111)
Creatinine, Ser: 0.93 mg/dL (ref 0.61–1.24)
GFR, Estimated: >60 mL/min (ref >60)
Glucose, Bld: 108 mg/dL — ABNORMAL HIGH (ref 70–99)
Potassium: 3.5 mmol/L (ref 3.5–5.1)
Sodium: 137 mmol/L (ref 135–145)
Total Bilirubin: 3 mg/dL — ABNORMAL HIGH (ref 0.3–1.2)
Total Protein: 6.8 g/dL (ref 6.5–8.1)

## 2023-04-14 LAB — TROPONIN I (HIGH SENSITIVITY)
Troponin I (High Sensitivity): 56 ng/L — ABNORMAL HIGH (ref <18)
Troponin I (High Sensitivity): 67 ng/L — ABNORMAL HIGH (ref <18)

## 2023-04-14 LAB — LIPASE, BLOOD: Lipase: 41 U/L (ref 11–51)

## 2023-04-14 MED ORDER — ACETAMINOPHEN 650 MG RE SUPP: 650.0000 mg | Freq: Four times a day (QID) | RECTAL | Status: DC | PRN

## 2023-04-14 MED ORDER — HEPARIN BOLUS VIA INFUSION
2500.0000 [IU] | Freq: Once | INTRAVENOUS | Status: AC
  Administered 2023-04-14: 2500 [IU] via INTRAVENOUS
  Filled 2023-04-14: qty 2500

## 2023-04-14 MED ORDER — ONDANSETRON HCL 4 MG/2ML IJ SOLN
4.0000 mg | Freq: Once | INTRAMUSCULAR | Status: AC
  Administered 2023-04-14: 4 mg via INTRAVENOUS
  Filled 2023-04-14: qty 2

## 2023-04-14 MED ORDER — HEPARIN (PORCINE) 25000 UT/250ML-% IV SOLN
1350.0000 [IU]/h | INTRAVENOUS | Status: DC
  Administered 2023-04-14: 1350 [IU]/h via INTRAVENOUS
  Filled 2023-04-14: qty 250

## 2023-04-14 MED ORDER — INSULIN ASPART 100 UNIT/ML IJ SOLN
0.0000 [IU] | Freq: Three times a day (TID) | INTRAMUSCULAR | Status: DC
  Administered 2023-04-21: 2 [IU] via SUBCUTANEOUS
  Administered 2023-04-21 (×2): 3 [IU] via SUBCUTANEOUS
  Administered 2023-04-22: 2 [IU] via SUBCUTANEOUS
  Administered 2023-04-22: 3 [IU] via SUBCUTANEOUS
  Administered 2023-04-22: 5 [IU] via SUBCUTANEOUS
  Administered 2023-04-23: 2 [IU] via SUBCUTANEOUS
  Administered 2023-04-23: 3 [IU] via SUBCUTANEOUS
  Administered 2023-04-23 – 2023-04-24 (×3): 2 [IU] via SUBCUTANEOUS
  Administered 2023-04-25: 5 [IU] via SUBCUTANEOUS
  Filled 2023-04-14: qty 0.15

## 2023-04-14 MED ORDER — SODIUM CHLORIDE 0.9 % IV BOLUS
1000.0000 mL | Freq: Once | INTRAVENOUS | Status: AC
  Administered 2023-04-14: 1000 mL via INTRAVENOUS

## 2023-04-14 MED ORDER — POLYETHYLENE GLYCOL 3350 17 G PO PACK
17.0000 g | PACK | Freq: Every day | ORAL | Status: DC | PRN
  Filled 2023-04-14: qty 1

## 2023-04-14 MED ORDER — INSULIN ASPART 100 UNIT/ML IJ SOLN
0.0000 [IU] | Freq: Every day | INTRAMUSCULAR | Status: DC
  Filled 2023-04-14: qty 0.05

## 2023-04-14 MED ORDER — PANTOPRAZOLE SODIUM 40 MG IV SOLR
40.0000 mg | Freq: Every day | INTRAVENOUS | Status: DC
  Administered 2023-04-14 – 2023-04-24 (×11): 40 mg via INTRAVENOUS
  Filled 2023-04-14 (×11): qty 10

## 2023-04-14 MED ORDER — IOHEXOL 300 MG/ML  SOLN
100.0000 mL | Freq: Once | INTRAMUSCULAR | Status: AC | PRN
  Administered 2023-04-14: 100 mL via INTRAVENOUS

## 2023-04-14 MED ORDER — LACTATED RINGERS IV SOLN: INTRAVENOUS | Status: AC

## 2023-04-14 MED ORDER — ACETAMINOPHEN 325 MG PO TABS
650.0000 mg | ORAL_TABLET | Freq: Four times a day (QID) | ORAL | Status: DC | PRN
  Filled 2023-04-14: qty 2

## 2023-04-14 MED ORDER — PANTOPRAZOLE SODIUM 20 MG PO TBEC
20.0000 mg | DELAYED_RELEASE_TABLET | Freq: Every day | ORAL | Status: DC
  Filled 2023-04-14: qty 1

## 2023-04-14 NOTE — ED Provider Notes (Signed)
[place] EMERGENCY DEPARTMENT AT [facility] Provider Note   CSN: [id number] Arrival date & time: 04/14/23  1230     History  No chief complaint on file.   Samuel [name] is a 77 y.o. male with past medical history of diabetes (non-insulin-dependent) presents to emergency department for evaluation of difficulty swallowing food and liquids over the past 2 weeks.  He reports he initially had difficulty swallowing food and is now progressed to difficulty with liquids and having to spit out his spit onto a towel due to difficulty swallowing. He is able to initiate swallowing mechanism. He states that when he tries to force his back down he occasionally vomits or dry heaves it back up.  He reports that he also has anterior right sided chest pain that radiates into the center and right back.  He denies recent radiation, fever, SOB, hemoptysis.  HPI     Home Medications Prior to Admission medications   Medication Sig Start Date End Date Taking? Authorizing Provider  metFORMIN (GLUCOPHAGE) 500 MG tablet Take 1 tablet (500 mg total) by mouth daily with breakfast. 12/19/22  Yes [name], MD  Multiple Vitamins-Minerals (MULTIVITAMIN MEN) TABS Take 1 tablet by mouth daily with breakfast.   Yes [provider]  Blood Glucose Monitoring Suppl (ACCU-CHEK GUIDE ME) w/Device KIT USE AS DIRECTED 3 TIMES A DAY 01/16/23   [name], MD  glucose blood (ACCU-CHEK GUIDE) test strip Use as instructed daily 12/19/22   [name], MD      Allergies    Penicillins, Sulfa antibiotics, and Procaine    Review of Systems   Review of Systems  Constitutional:  Negative for chills, fatigue and fever.  Respiratory:  Negative for cough, chest tightness, shortness of breath and wheezing.   Cardiovascular:  Negative for chest pain and palpitations.  Gastrointestinal:  Negative for abdominal pain, constipation, diarrhea, nausea and vomiting.  Neurological:  Negative for dizziness,  seizures, weakness, light-headedness, numbness and headaches.    Physical Exam Updated Vital Signs BP 113/64   Pulse (!) 55   Temp 98.4 F (36.9 C) (Oral)   Resp 14   Ht 5\' 6"  (1.676 m)   Wt 92.5 kg   SpO2 94%   BMI 32.91 kg/m  Physical Exam Vitals and nursing note reviewed.  Constitutional:      General: He is not in acute distress.    Appearance: Normal appearance.  HENT:     Head: Normocephalic and atraumatic.     Jaw: No trismus, tenderness, swelling or pain on movement.     Mouth/Throat:     Mouth: Mucous membranes are moist.     Pharynx: No oropharyngeal exudate or posterior oropharyngeal erythema.     Comments: No uvula deviation nor swelling noted Hard nodule noted to inferior region of anterior right neck at clavicle No trismus Eyes:     Conjunctiva/sclera: Conjunctivae normal.  Neck:     Thyroid: No thyroid mass.     Comments: No submandibular swelling or tenderness  Cardiovascular:     Rate and Rhythm: Normal rate.  Pulmonary:     Effort: Pulmonary effort is normal. No respiratory distress.     Breath sounds: Normal breath sounds.  Chest:     Chest wall: Tenderness present.  Abdominal:     General: There is no distension.     Palpations: Abdomen is soft.     Tenderness: There is no abdominal tenderness. There is no right CVA tenderness, left CVA tenderness or  guarding.  Musculoskeletal:     Cervical back: Normal range of motion and neck supple. No rigidity.  Lymphadenopathy:     Cervical: No cervical adenopathy.  Skin:    Coloration: Skin is not jaundiced or pale.  Neurological:     Mental Status: He is alert. Mental status is at baseline.     ED Results / Procedures / Treatments   Labs (all labs ordered are listed, but only abnormal results are displayed) Labs Reviewed  CBC WITH DIFFERENTIAL/PLATELET - Abnormal; Notable for the following components:      Result Value   WBC 13.2 (*)    RBC 6.19 (*)    Hemoglobin 18.2 (*)    HCT 53.8 (*)     Neutro Abs 9.4 (*)    Monocytes Absolute 1.3 (*)    Abs Immature Granulocytes 0.09 (*)    All other components within normal limits  COMPREHENSIVE METABOLIC PANEL - Abnormal; Notable for the following components:   CO2 21 (*)    Glucose, Bld 108 (*)    BUN 26 (*)    AST 82 (*)    ALT 91 (*)    Total Bilirubin 3.0 (*)    All other components within normal limits  TROPONIN I (HIGH SENSITIVITY) - Abnormal; Notable for the following components:   Troponin I (High Sensitivity) 56 (*)    All other components within normal limits  TROPONIN I (HIGH SENSITIVITY) - Abnormal; Notable for the following components:   Troponin I (High Sensitivity) 67 (*)    All other components within normal limits  LIPASE, BLOOD  HEPARIN LEVEL (UNFRACTIONATED)  CBC  HEPATIC FUNCTION PANEL  HEPATITIS PANEL, ACUTE  APTT  PROTIME-INR  BASIC METABOLIC PANEL  CBG MONITORING, ED    EKG EKG Interpretation Date/Time:  Friday April 14 2023 12:44:03 EDT Ventricular Rate:  99 PR Interval:  168 QRS Duration:  134 QT Interval:  368 QTC Calculation: 473 R Axis:   -48  Text Interpretation: Sinus tachycardia Ventricular bigeminy IVCD, consider atypical RBBB LVH with IVCD and secondary repol abnrm Confirmed by Vivi Barrack [phone]) on 04/14/2023 3:45:46 PM  Radiology US Abdomen Limited RUQ (LIVER/GB)  Result Date: 04/15/2023 CLINICAL DATA:  Elevated LFTs EXAM: ULTRASOUND ABDOMEN LIMITED RIGHT UPPER QUADRANT COMPARISON:  Chest CT today FINDINGS: Gallbladder: Stones and sludge seen within the gallbladder. No wall thickening. The patient was tender over the gallbladder during the study. Common bile duct: Diameter: Normal caliber, 3 mm Liver: Increased echotexture compatible with fatty infiltration. No focal abnormality or biliary ductal dilatation. Portal vein is patent on color Doppler imaging with normal direction of blood flow towards the liver. Other: None. IMPRESSION: Gallstones and sludge within the gallbladder.  The patient was tender over the gallbladder during the study. Recommend clinical correlation for possible cholecystitis. Fatty liver. Electronically Signed   By: Charlett Nose M.D.   On: 04/15/2023 00:20   CT Soft Tissue Neck W Contrast  Result Date: 04/14/2023 CLINICAL DATA:  Initial evaluation for acute difficulty swallowing over past few days. EXAM: CT NECK WITH CONTRAST TECHNIQUE: Multidetector CT imaging of the neck was performed using the standard protocol following the bolus administration of intravenous contrast. RADIATION DOSE REDUCTION: This exam was performed according to the departmental dose-optimization program which includes automated exposure control, adjustment of the mA and/or kV according to patient size and/or use of iterative reconstruction technique. CONTRAST:  OMNIPAQUE IOHEXOL 300 MG/ML  SOLN COMPARISON:  None Available. FINDINGS: Pharynx and larynx: Oral cavity within normal  limits. Oropharynx and nasopharynx within normal limits. No retropharyngeal collection or swelling. Epiglottis within normal limits. Hypopharynx and supraglottic larynx normal. Glottis symmetric and normal. Subglottic airway patent clear. Salivary glands: Salivary glands including the parotid and submandibular glands are within normal limits. Thyroid: Normal. Lymph nodes: No enlarged or pathologic adenopathy. Vascular: Normal intravascular enhancement seen within the neck. Mild atheromatous change about the carotid bifurcations. Limited intracranial: Unremarkable. Visualized orbits: Unremarkable. Mastoids and visualized paranasal sinuses: Clear/normal. Skeleton: 6 mm sclerotic lesion within the T2 vertebral body noted, nonspecific. No other worrisome osseous lesions. 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 Upper chest: No acute finding. Other: None. IMPRESSION: 1. Negative CT of the neck. No acute inflammatory changes or other abnormality identified.  2. 6 mm sclerotic lesion within the T2 vertebral body, favored benign, but incompletely assessed on this exam. Further evaluation with dedicated MRI, with and without contrast, recommended for complete evaluation. Electronically Signed   By: Rise Mu M.D.   On: 04/14/2023 17:50   CT Chest W Contrast  Result Date: 04/14/2023 CLINICAL DATA:  Dysphagia, difficulty swallowing even liquids over the last few days, nausea, vomiting EXAM: CT CHEST WITH CONTRAST TECHNIQUE: Multidetector CT imaging of the chest was performed during intravenous contrast administration. RADIATION DOSE REDUCTION: This exam was performed according to the departmental dose-optimization program which includes automated exposure control, adjustment of the mA and/or kV according to patient size and/or use of iterative reconstruction technique. CONTRAST:  OMNIPAQUE IOHEXOL 300 MG/ML  SOLN COMPARISON:  Chest radiograph earlier today FINDINGS: Cardiovascular: Acute pulmonary emboli at the bifurcation of the right mainstem bronchus extending into the right upper lobe and right lower lobe pulmonary arteries. No evidence of right heart strain. No pericardial effusion. Aortic atherosclerotic calcification. Mediastinum/Nodes: Wall thickening of the lower esophagus with layering debris in the esophagus upstream from the area of thickening. Trace fluid about the lower esophagus. Trachea is unremarkable. No lymphadenopathy. Lungs/Pleura: No focal consolidation, pleural effusion, or pneumothorax. 4 mm ground-glass nodule in the left lower lobe, likely infectious/inflammatory. Per Fleischner Society guidelines, no routine follow-up imaging is recommended. These guidelines do not apply to immunocompromised patients and patients with cancer. Follow up in patients with significant comorbidities as clinically warranted. For lung cancer screening, adhere to Lung-RADS guidelines. Reference: Radiology. 2017; 284(1):228-43. Upper Abdomen: No acute  abnormality. Musculoskeletal: No acute fracture. IMPRESSION: 1. Acute pulmonary embolism in the right main pulmonary artery extending into the upper and lower lobes. No evidence of right heart strain. 2. Wall thickening of the lower esophagus with layering debris upstream from the area of thickening. Trace fluid about the lower esophagus. This may be due to esophagitis however correlation with endoscopy is recommended to exclude malignancy. Aortic Atherosclerosis (ICD10-I70.0). Critical Value/emergent results were called by telephone at the time of interpretation on 04/14/2023 at 5:32 pm to provider Sabra Heck , who verbally acknowledged these results. Electronically Signed   By: Minerva Fester M.D.   On: 04/14/2023 17:32   DG Chest Port 1 View  Result Date: 04/14/2023 CLINICAL DATA:  Chest discomfort EXAM: PORTABLE CHEST 1 VIEW COMPARISON:  None Available. FINDINGS: The heart size and mediastinal contours are within normal limits. Bibasilar scarring/atelectasis. The lungs are otherwise clear. Advanced arthritis left shoulder. Aortic atherosclerotic calcification. IMPRESSION: No active disease. Electronically Signed   By: Minerva Fester M.D.   On: 04/14/2023 15:57    Procedures Procedures    Medications Ordered in ED Medications  heparin ADULT  infusion 100 units/mL (25000 units/298mL) (1,350 Units/hr Intravenous New Bag/Given 04/14/23 1848)  lactated ringers infusion ( Intravenous New Bag/Given 04/14/23 2238)  insulin aspart (novoLOG) injection 0-15 Units (has no administration in time range)  insulin aspart (novoLOG) injection 0-5 Units ( Subcutaneous Not Given 04/14/23 2234)  acetaminophen (TYLENOL) tablet 650 mg (has no administration in time range)    Or  acetaminophen (TYLENOL) suppository 650 mg (has no administration in time range)  polyethylene glycol (MIRALAX / GLYCOLAX) packet 17 g (has no administration in time range)  pantoprazole (PROTONIX) injection 40 mg (40 mg Intravenous  Given 04/14/23 2251)  sodium chloride 0.9 % bolus 1,000 mL (0 mLs Intravenous Stopped 04/14/23 1853)  ondansetron (ZOFRAN) injection 4 mg (4 mg Intravenous Given 04/14/23 1850)  iohexol (OMNIPAQUE) 300 MG/ML solution 100 mL (100 mLs Intravenous Contrast Given 04/14/23 1612)  heparin bolus via infusion 2,500 Units (2,500 Units Intravenous Bolus from Bag 04/14/23 1852)    ED Course/ Medical Decision Making/ A&P                                 Medical Decision Making Amount and/or Complexity of Data Reviewed Radiology: ordered.  Risk Prescription drug management. Decision regarding hospitalization.    Patient presents to the ED for concern of worsening difficulty swallowing and right sided chest pain that radiates into right back for the past two weeks, this involves an extensive number of treatment options, and is a complaint that carries with it a high risk of complications and morbidity.  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  The pertinent results include: Elevated troponin 56 with no prior to compare nor known history of heart conditions. Leukocytosis 13.2. Mild transaminitis elevated from baseline 08/09/2022 that was within normal limits   Imaging Studies ordered:  I ordered imaging studies including CT neck and chest with contrast due to difficulty swallowing and chest pain I independently visualized and interpreted imaging which showed acute PE in right main pulmonary artery extending to the upper and lower lobes with no evidence of right heart strain Wall thickening of lower esophagus which may be related to esophagitis vs  malignancy I agree with the radiologist interpretation   Cardiac Monitoring:  The patient was maintained on a cardiac monitor.  I personally viewed and interpreted the cardiac monitored which showed an underlying rhythm of: wide irregular rhythm with occasional premature beats. No previous EKG to compare   Medicines ordered and prescription drug management:  I ordered medication including zofran for nausea  Reevaluation of the patient after these medicines showed that the patient improved I have reviewed the patients home medicines and have made adjustments as needed    Consultations Obtained:  I requested consultation with the hospitalist,  and discussed lab and imaging findings as well as pertinent plan - they recommend: admission for IV heparin and PE management   Problem List / ED Course:  Pulmonary embolism Difficulty swallowing   Reevaluation:  After the interventions noted above, I reevaluated the patient and found that they have :stayed the same   Dispostion:  Patient is resting comfortably in bed with no  physical signs of acute distress.  He is speaking full and complete sentences.  Vital signs WNL.  See HPI.  He complains of right-sided chest pain that radiates into the right back.  He has mild tenderness to right chest wall.  He also complains of difficulty with swallowing food that is progressed to difficulty swallowing liquid/saliva.  Upon evaluation, no uvula deviation or swelling.  No posterior erythema or exudates.  Patient reports that his voice has been weak for but is not hoarse upon exam.  He is able to hold conversation while maintaining secretions.  He is not drooling. Airway patent. He occasionally will wipe spit with towel.  Lung sounds CTAB.  No abdominal tenderness.  Rest of PE exam benign (see PE)  Lab work is significant for leukocytosis (13.2) and mild elevation in troponin of 56 with second troponin 67.  EKG significant for wide irregular rhythm  with occasional premature beats ranging from 60 to 80 bpm.  No ST or ischemic changes noted.  12:41 AM Radiology called to inform patient has a PE in R main PA. Will initiate heparin in ED, obtain heparin level.  After consideration of the diagnostic results and the patients response to treatment, I feel that the patent would benefit from admission continued heparin administration. Consulted Nolberto Hanlon MD who accepts patient for admission.  Dr. Jearld Fenton independently assessed patient and agrees with treatment plan.         Final Clinical Impression(s) / ED Diagnoses Final diagnoses:  Acute pulmonary embolism, unspecified pulmonary embolism type, unspecified whether acute cor pulmonale present (HCC)  Difficulty swallowing liquids    Rx / DC Orders ED Discharge Orders     None         Judithann Sheen, PA 04/15/23 0041    Loetta Rough, MD 04/19/23 [phone]

## 2023-04-14 NOTE — ED Triage Notes (Signed)
Pt arrived reporting he has been unable to swallow for almost two weeks. Endorses "chest soreness" due to it. States no vomiting. Denies shob , able to speak in full sentences. NAD in triage. No other symptoms reported

## 2023-04-14 NOTE — Assessment & Plan Note (Signed)
I think the slightly elevated BUN of this patient as well as his erythrocytosis represent dehydration in the setting of patient having difficulty eating and drinking for the last several days due to his dysphagia, patient received a liter of normal saline bolus I will give him 100 cc/h of lactated Ringer's overnight.

## 2023-04-14 NOTE — H&P (Signed)
History and Physical    Patient: Samuel [name] PIR:[id number] DOB: 1946-04-16 DOA: 04/14/2023 DOS: the patient was seen and examined on 04/14/2023 PCP: [name], MD  Patient coming from: Home  Chief Complaint: No chief complaint on file.  HPI: Samuel [name] is a 77 y.o. male with medical history significant of diabetes mellitus.  Patient otherwise reports that he is pretty active.  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  And he actually lives with his clients.  Patient was in his usual state of health till about 2 weeks ago, since that time patient describes a number of slow insidious onset symptoms.  One of the first things he noticed was that food was getting stuck when he would swallow solids.  Since then it has progressed to liquids and patient claims that he has not been able to eat or drink pretty much anything for the last 24 hours and his urine has gotten darker.  Further patient reports that he was having a sensation of food getting stuck in the mid part of his anterior sternum.  With pain even after he would stop eating that would last throughout the day.  Further patient reports "diaphragm area "pain that was radiating laterally on the right side towards the back, worse with deep inspiration, prompting the patient to take shallow breaths to maintain "oxygenation ".  Patient does not personally report any sensation of shortness of breath fever or presyncope or palpitation or loss of consciousness.  Patient does not report any leg swelling.  Patient finally came to the ER because of his symptoms as above.  Workup in the ER is positive for CAT scan showing pulmonary embolism, medical evaluation is sought Review of Systems: As mentioned in the history of present illness. All other systems reviewed and are negative. Past Medical History:  Diagnosis Date   Diabetes mellitus without complication (HCC)    Past  Surgical History:  Procedure Laterality Date   KNEE ARTHROPLASTY Bilateral    Social History:  reports that he quit smoking about 37 years ago. His smoking use included cigarettes. He started smoking about 52 years ago. He has a 15 pack-year smoking history. He has never used smokeless tobacco. He reports current alcohol use of about 1.0 - 2.0 standard drink of alcohol per week. He reports that he does not use drugs.  Allergies  Allergen Reactions   Penicillins     Shortness of breath   Sulfa Antibiotics     Blisters    Procaine     Family History  Problem Relation Age of Onset   Hypertension Father    COPD Father    COPD Sister    Diabetes Maternal Grandfather     Prior to Admission medications   Medication Sig Start Date End Date Taking? Authorizing Provider  Blood Glucose Monitoring Suppl (ACCU-CHEK GUIDE ME) w/Device KIT USE AS DIRECTED 3 TIMES A DAY 01/16/23   [name], MD  glucose blood (ACCU-CHEK GUIDE) test strip Use as instructed daily 12/19/22   [name], MD  metFORMIN (GLUCOPHAGE) 500 MG tablet Take 1 tablet (500 mg total) by mouth daily with breakfast. 12/19/22   [name], MD    Physical Exam: Vitals:   04/14/23 1241 04/14/23 1247 04/14/23 1557 04/14/23 1930  BP: (!) 130/93  137/70 123/70  Pulse: 76  75 72  Resp: 20  19 15   Temp: 98.2 F (36.8 C)  98.1 F (36.7  C)   TempSrc: Oral  Oral   SpO2: 97%  96% 94%  Weight:  92.5 kg    Height:  5\' 6"  (1.676 m)     General: Patient is alert awake, giving a coherent account of her symptoms does not appear to be distressed.  Has already been started on a heparin infusion. Respiratory exam: Bilateral intravesicular Cardiovascular exam S1-S2 normal Abdomen all quadrants soft nontender Extremities warm without edema no focal motor deficit. Data Reviewed:  Labs on Admission:  Results for orders placed or performed during the hospital encounter of 04/14/23 (from the past 24 hour(s))  CBC with  Differential     Status: Abnormal   Collection Time: 04/14/23  2:16 PM  Result Value Ref Range   WBC 13.2 (H) 4.0 - 10.5 K/uL   RBC 6.19 (H) 4.22 - 5.81 MIL/uL   Hemoglobin 18.2 (H) 13.0 - 17.0 g/dL   HCT 95.6 (H) 21.3 - 08.6 %   MCV 86.9 80.0 - 100.0 fL   MCH 29.4 26.0 - 34.0 pg   MCHC 33.8 30.0 - 36.0 g/dL   RDW 57.8 46.9 - 62.9 %   Platelets 211 150 - 400 K/uL   nRBC 0.0 0.0 - 0.2 %   Neutrophils Relative % 71 %   Neutro Abs 9.4 (H) 1.7 - 7.7 K/uL   Lymphocytes Relative 18 %   Lymphs Abs 2.3 0.7 - 4.0 K/uL   Monocytes Relative 10 %   Monocytes Absolute 1.3 (H) 0.1 - 1.0 K/uL   Eosinophils Relative 0 %   Eosinophils Absolute 0.0 0.0 - 0.5 K/uL   Basophils Relative 0 %   Basophils Absolute 0.0 0.0 - 0.1 K/uL   Immature Granulocytes 1 %   Abs Immature Granulocytes 0.09 (H) 0.00 - 0.07 K/uL  Comprehensive metabolic panel     Status: Abnormal   Collection Time: 04/14/23  2:16 PM  Result Value Ref Range   Sodium 137 135 - 145 mmol/L   Potassium 3.5 3.5 - 5.1 mmol/L   Chloride 101 98 - 111 mmol/L   CO2 21 (L) 22 - 32 mmol/L   Glucose, Bld 108 (H) 70 - 99 mg/dL   BUN 26 (H) 8 - 23 mg/dL   Creatinine, Ser 5.28 0.61 - 1.24 mg/dL   Calcium 9.4 8.9 - 41.3 mg/dL   Total Protein 6.8 6.5 - 8.1 g/dL   Albumin 3.7 3.5 - 5.0 g/dL   AST 82 (H) 15 - 41 U/L   ALT 91 (H) 0 - 44 U/L   Alkaline Phosphatase 57 38 - 126 U/L   Total Bilirubin 3.0 (H) 0.3 - 1.2 mg/dL   GFR, Estimated >24 >40 mL/min   Anion gap 15 5 - 15  Troponin I (High Sensitivity)     Status: Abnormal   Collection Time: 04/14/23  2:16 PM  Result Value Ref Range   Troponin I (High Sensitivity) 56 (H) <18 ng/L  Lipase, blood     Status: None   Collection Time: 04/14/23  2:16 PM  Result Value Ref Range   Lipase 41 11 - 51 U/L  Troponin I (High Sensitivity)     Status: Abnormal   Collection Time: 04/14/23  5:52 PM  Result Value Ref Range   Troponin I (High Sensitivity) 67 (H) <18 ng/L   Basic Metabolic Panel: Recent  Labs  Lab 04/14/23 1416  NA 137  K 3.5  CL 101  CO2 21*  GLUCOSE 108*  BUN 26*  CREATININE 0.93  CALCIUM 9.4   Liver Function Tests: Recent Labs  Lab 04/14/23 1416  AST 82*  ALT 91*  ALKPHOS 57  BILITOT 3.0*  PROT 6.8  ALBUMIN 3.7   Recent Labs  Lab 04/14/23 1416  LIPASE 41   No results for input(s): "AMMONIA" in the last 168 hours. CBC: Recent Labs  Lab 04/14/23 1416  WBC 13.2*  NEUTROABS 9.4*  HGB 18.2*  HCT 53.8*  MCV 86.9  PLT 211   Cardiac Enzymes: Recent Labs  Lab 04/14/23 1416 04/14/23 1752  TROPONINIHS 56* 67*    BNP (last 3 results) No results for input(s): "PROBNP" in the last 8760 hours. CBG: No results for input(s): "GLUCAP" in the last 168 hours.  Radiological Exams on Admission:  CT Soft Tissue Neck W Contrast  Result Date: 04/14/2023 CLINICAL DATA:  Initial evaluation for acute difficulty swallowing over past few days. EXAM: CT NECK WITH CONTRAST TECHNIQUE: Multidetector CT imaging of the neck was performed using the standard protocol following the bolus administration of intravenous contrast. RADIATION DOSE REDUCTION: This exam was performed according to the departmental dose-optimization program which includes automated exposure control, adjustment of the mA and/or kV according to patient size and/or use of iterative reconstruction technique. CONTRAST:  OMNIPAQUE IOHEXOL 300 MG/ML  SOLN COMPARISON:  None Available. FINDINGS: Pharynx and larynx: Oral cavity within normal limits. Oropharynx and nasopharynx within normal limits. No retropharyngeal collection or swelling. Epiglottis within normal limits. Hypopharynx and supraglottic larynx normal. Glottis symmetric and normal. Subglottic airway patent clear. Salivary glands: Salivary glands including the parotid and submandibular glands are within normal limits. Thyroid: Normal. Lymph nodes: No enlarged or pathologic adenopathy. Vascular: Normal intravascular enhancement seen within the  neck. Mild atheromatous change about the carotid bifurcations. Limited intracranial: Unremarkable. Visualized orbits: Unremarkable. Mastoids and visualized paranasal sinuses: Clear/normal. Skeleton: 6 mm sclerotic lesion within the T2 vertebral body noted, nonspecific. No other worrisome osseous lesions. 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 Upper chest: No acute finding. Other: None. IMPRESSION: 1. Negative CT of the neck. No acute inflammatory changes or other abnormality identified. 2. 6 mm sclerotic lesion within the T2 vertebral body, favored benign, but incompletely assessed on this exam. Further evaluation with dedicated MRI, with and without contrast, recommended for complete evaluation. Electronically Signed   By: Rise Mu M.D.   On: 04/14/2023 17:50   CT Chest W Contrast  Result Date: 04/14/2023 CLINICAL DATA:  Dysphagia, difficulty swallowing even liquids over the last few days, nausea, vomiting EXAM: CT CHEST WITH CONTRAST TECHNIQUE: Multidetector CT imaging of the chest was performed during intravenous contrast administration. RADIATION DOSE REDUCTION: This exam was performed according to the departmental dose-optimization program which includes automated exposure control, adjustment of the mA and/or kV according to patient size and/or use of iterative reconstruction technique. CONTRAST:  OMNIPAQUE IOHEXOL 300 MG/ML  SOLN COMPARISON:  Chest radiograph earlier today FINDINGS: Cardiovascular: Acute pulmonary emboli at the bifurcation of the right mainstem bronchus extending into the right upper lobe and right lower lobe pulmonary arteries. No evidence of right heart strain. No pericardial effusion. Aortic atherosclerotic calcification. Mediastinum/Nodes: Wall thickening of the lower esophagus with layering debris in the esophagus upstream from the area of thickening. Trace fluid about the lower esophagus. Trachea is  unremarkable. No lymphadenopathy. Lungs/Pleura: No focal consolidation, pleural effusion, or pneumothorax. 4 mm ground-glass nodule in the left lower lobe, likely infectious/inflammatory. Per Fleischner Society guidelines, no routine follow-up imaging is recommended.  These guidelines do not apply to immunocompromised patients and patients with cancer. Follow up in patients with significant comorbidities as clinically warranted. For lung cancer screening, adhere to Lung-RADS guidelines. Reference: Radiology. 2017; 284(1):228-43. Upper Abdomen: No acute abnormality. Musculoskeletal: No acute fracture. IMPRESSION: 1. Acute pulmonary embolism in the right main pulmonary artery extending into the upper and lower lobes. No evidence of right heart strain. 2. Wall thickening of the lower esophagus with layering debris upstream from the area of thickening. Trace fluid about the lower esophagus. This may be due to esophagitis however correlation with endoscopy is recommended to exclude malignancy. Aortic Atherosclerosis (ICD10-I70.0). Critical Value/emergent results were called by telephone at the time of interpretation on 04/14/2023 at 5:32 pm to provider Sabra Heck , who verbally acknowledged these results. Electronically Signed   By: Minerva Fester M.D.   On: 04/14/2023 17:32   DG Chest Port 1 View  Result Date: 04/14/2023 CLINICAL DATA:  Chest discomfort EXAM: PORTABLE CHEST 1 VIEW COMPARISON:  None Available. FINDINGS: The heart size and mediastinal contours are within normal limits. Bibasilar scarring/atelectasis. The lungs are otherwise clear. Advanced arthritis left shoulder. Aortic atherosclerotic calcification. IMPRESSION: No active disease. Electronically Signed   By: Minerva Fester M.D.   On: 04/14/2023 15:57    EKG: Independently reviewed. NSR with frequent PVC   Assessment and Plan: * Acute pulmonary embolism (HCC) Affecting the right main pulmonary artery extending into the upper and lower  lobes, associated with minimal troponin elevation, fortunately no vital instability at this time.  Patient has already been started on heparin at this time, which shall be continued.  Check lower extremity Dopplers to establish baseline  Dysphagia This has been ongoing for the last 2 weeks, progressive, patient currently reports that he is having trouble swallowing even any amount of liquids, therefore at this time I will send a referral to our gastroenterologist to see if they can perform and endoscopy for this patient.  Certainly the workup would be complicated by the fact that the patient is on heparin infusion for an acute PE. I have sent a message to Dr. Ewing Schlein. Please follow up with him in AM  Troponin I above reference range Flat. Due to PE. But frequent PVC and IVCD in EKG. Wil get echo.  Swelling of clavicular region Patient was complaining of some swelling and tenderness of the right sternoclavicular joint area that he just noticed today., I did appreciate a smooth swelling out 1/2 cm x 1/2 cm overlying the joint, which seemed somewhat related if not exactly fixed to the underlying bone/clavicle.  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  Again as a mention it is smooth, minimally tender to the patient.  I do not think this necessarily reflects any malignant process.  no lymphadenopathy was seen.  I think this should be continued to be clinically monitored and in case changes in size or characteristic, can be biopsied at that time.  LFT elevation Minimal in the setting of VTE. Will hceck acute hepatitis panel, US liver.  Dehydration I think the slightly elevated BUN of this patient as well as his erythrocytosis represent dehydration in the setting of patient having difficulty eating and drinking for the last several days due to his dysphagia, patient received a liter of normal saline bolus I will give him 100 cc/h  of lactated Ringer's overnight.      Advance Care Planning:   Code Status:  Not on file full code.  Consults: I have sent a message dr. Ewing Schlein for GI eval.   Family Communication: per patient.  Severity of Illness: The appropriate patient status for this patient is INPATIENT. Inpatient status is judged to be reasonable and necessary in order to provide the required intensity of service to ensure the patient's safety. The patient's presenting symptoms, physical exam findings, and initial radiographic and laboratory data in the context of their chronic comorbidities is felt to place them at high risk for further clinical deterioration. Furthermore, it is not anticipated that the patient will be medically stable for discharge from the hospital within 2 midnights of admission.   * I certify that at the point of admission it is my clinical judgment that the patient will require inpatient hospital care spanning beyond 2 midnights from the point of admission due to high intensity of service, high risk for further deterioration and high frequency of surveillance required.*  Author: Nolberto Hanlon, MD 04/14/2023 7:50 PM  For on call review [email or web address].

## 2023-04-14 NOTE — Assessment & Plan Note (Signed)
Minimal in the setting of VTE. Will hceck acute hepatitis panel, US liver.

## 2023-04-14 NOTE — Assessment & Plan Note (Addendum)
This has been ongoing for the last 2 weeks, progressive, patient currently reports that he is having trouble swallowing even any amount of liquids, therefore at this time I will send a referral to our gastroenterologist to see if they can perform and endoscopy for this patient.  Certainly the workup would be complicated by the fact that the patient is on heparin infusion for an acute PE. I have sent a message to Dr. Ewing Schlein. Please follow up with him in AM

## 2023-04-14 NOTE — Telephone Encounter (Signed)
Chief Complaint: Chest pain upper chest  Symptoms: 7/10 pain, feels like something stuck in throat, unable to swallow liquids, vomiting, nausea, lightheaded Frequency: Onset last night Pertinent Negatives: Patient denies other symptoms Disposition: [x] ED /[] Urgent Care (no appt availability in office) / [] Appointment(In office/virtual)/ []  [facility]/ [] Home Care/ [] Refused Recommended Disposition /[] [facility]/ []  Follow-up with PCP Additional Notes: Patient's neighbor Lanora Manis called and 3-way called patient to get permission to speak to her. He says he is not able to swallow and it feels like something is stuck in the throat, unable to swallow liquids, unable to take medications. Lanora Manis says this swallowing issue has been ongoing for a while and she feels he may be dehydrated due to not able to swallow liquids. I advised patient to go to the ED now. 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 Lanora Manis says his friend Maralyn Sago is going to come take him to the ED as soon as she rearranges her appointment. I advised if CP worsens to call 911, don't wait, he verbalized understanding.   Reason for Disposition  SEVERE chest pain  Answer Assessment - Initial Assessment Questions 1. LOCATION: "Where does it hurt?"       Up high in the chest 2. ONSET: "When did the chest pain begin?" (Minutes, hours or days)      Last night 3. PATTERN: "Does the pain come and go, or has it been constant since it started?"  "Does it get worse with exertion?"      Constant  4. SEVERITY: "How bad is the pain?"  (e.g., Scale 1-10; mild, moderate, or severe)    - MILD (1-3): doesn't interfere with normal activities     - MODERATE (4-7): interferes with normal activities or awakens from sleep    - SEVERE (8-10): excruciating pain, unable to do any normal activities       7 5. OTHER SYMPTOMS: "Do you have any other  symptoms?" (e.g., dizziness, nausea, vomiting, sweating, fever, difficulty breathing, cough)       Vomiting, nausea, lightheaded  Protocols used: Chest Pain-A-AH

## 2023-04-14 NOTE — Assessment & Plan Note (Signed)
Flat. Due to PE. But frequent PVC and IVCD in EKG. Wil get echo.

## 2023-04-14 NOTE — Assessment & Plan Note (Addendum)
Affecting the right main pulmonary artery extending into the upper and lower lobes, associated with minimal troponin elevation, fortunately no vital instability at this time.  Patient has already been started on heparin at this time, which shall be continued.  Check lower extremity Dopplers to establish baseline

## 2023-04-14 NOTE — Assessment & Plan Note (Addendum)
Patient was complaining of some swelling and tenderness of the right sternoclavicular joint area that he just noticed today., I did appreciate a smooth swelling out 1/2 cm x 1/2 cm overlying the joint, which seemed somewhat related if not exactly fixed to the underlying bone/clavicle.  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  Again as a mention it is smooth, minimally tender to the patient.  I do not think this necessarily reflects any malignant process.  no lymphadenopathy was seen.  I think this should be continued to be clinically monitored and in case changes in size or characteristic, can be biopsied at that time.

## 2023-04-14 NOTE — ED Provider Triage Note (Signed)
Emergency Medicine Provider Triage Evaluation Note  Ott Zimmerle , a 77 y.o. male  was evaluated in triage.  Pt complains of chest discomfort and not being able to swallow food, water or medicine   Review of Systems  Positive: Chest and abdominal discomfort  Negative: fever  Physical Exam  BP (!) 130/93 (BP Location: Right Arm)   Pulse 76   Temp 98.2 F (36.8 C) (Oral)   Resp 20   Ht 5\' 6"  (1.676 m)   Wt 92.5 kg   SpO2 97%   BMI 32.91 kg/m  Gen:   Awake, no distress   Resp:  Normal effort  MSK:   Moves extremities without difficulty  Other:    Medical Decision Making  Medically screening exam initiated at 1:06 PM.  Appropriate orders placed.  Rusell Meneely was informed that the remainder of the evaluation will be completed by another provider, this initial triage assessment does not replace that evaluation, and the importance of remaining in the ED until their evaluation is complete.     Elson Areas, [place] 04/14/23 1308

## 2023-04-14 NOTE — Telephone Encounter (Signed)
Noted. Agreeable with disposition

## 2023-04-14 NOTE — Progress Notes (Signed)
PHARMACY - ANTICOAGULATION CONSULT NOTE  Pharmacy Consult for heparin Indication: acute pulmonary embolus  Allergies  Allergen Reactions   Penicillins     Shortness of breath   Sulfa Antibiotics     Blisters    Procaine     Patient Measurements: Height: 5\' 6"  (167.6 cm) Weight: 92.5 kg (203 lb 14.8 oz) IBW/kg (Calculated) : 63.8 Heparin Dosing Weight: 84 kg  Vital Signs: Temp: 98.1 F (36.7 C) (10/11 1557) Temp Source: Oral (10/11 1557) BP: 137/70 (10/11 1557) Pulse Rate: 75 (10/11 1557)  Labs: Recent Labs    04/14/23 1416  HGB 18.2*  HCT 53.8*  PLT 211  CREATININE 0.93  TROPONINIHS 56*    Estimated Creatinine Clearance: 70.8 mL/min (by C-G formula based on SCr of 0.93 mg/dL).   Medical History: Past Medical History:  Diagnosis Date   Diabetes mellitus without complication [facility])      Assessment: Patient is a 77 y.o M who presented to the ED on 04/14/23 with c/o CP and difficulty swallowing. Chest CT showed acute PE in the right main pulmonary artery extending into the upper and lower lobes with no evidence of RHS. Pharmacy has been consulted to dose heparin for VTE treatment.  Today, 04/14/2023: - cbc ok - scr <1  Goal of Therapy:  Heparin level 0.3-0.7 units/ml Monitor platelets by anticoagulation protocol: Yes   Plan:  - heparin 2500 units IV x1 bolus, then drip at 1350 units/hr - check 8 hr heparin level - monitor for s/sx bleeding  Dorna Leitz P 04/14/2023,6:08 PM

## 2023-04-15 ENCOUNTER — Inpatient Hospital Stay (HOSPITAL_COMMUNITY): Payer: 59

## 2023-04-15 ENCOUNTER — Encounter (HOSPITAL_COMMUNITY): Payer: Self-pay | Admitting: Internal Medicine

## 2023-04-15 DIAGNOSIS — R001 Bradycardia, unspecified: Secondary | ICD-10-CM | POA: Diagnosis not present

## 2023-04-15 DIAGNOSIS — I2699 Other pulmonary embolism without acute cor pulmonale: Secondary | ICD-10-CM | POA: Diagnosis not present

## 2023-04-15 DIAGNOSIS — I441 Atrioventricular block, second degree: Secondary | ICD-10-CM | POA: Diagnosis not present

## 2023-04-15 LAB — BASIC METABOLIC PANEL
Anion gap: 9 (ref 5–15)
BUN: 20 mg/dL (ref 8–23)
CO2: 23 mmol/L (ref 22–32)
Calcium: 8.1 mg/dL — ABNORMAL LOW (ref 8.9–10.3)
Chloride: 106 mmol/L (ref 98–111)
Creatinine, Ser: 0.77 mg/dL (ref 0.61–1.24)
GFR, Estimated: >60 mL/min (ref >60)
Glucose, Bld: 82 mg/dL (ref 70–99)
Potassium: 3.5 mmol/L (ref 3.5–5.1)
Sodium: 138 mmol/L (ref 135–145)

## 2023-04-15 LAB — GLUCOSE, CAPILLARY
Glucose-Capillary: 80 mg/dL (ref 70–99)
Glucose-Capillary: 84 mg/dL (ref 70–99)

## 2023-04-15 LAB — CBC
HCT: 45.5 % (ref 39.0–52.0)
Hemoglobin: 15.2 g/dL (ref 13.0–17.0)
MCH: 29.7 pg (ref 26.0–34.0)
MCHC: 33.4 g/dL (ref 30.0–36.0)
MCV: 88.9 fL (ref 80.0–100.0)
Platelets: 164 10*3/uL (ref 150–400)
RBC: 5.12 MIL/uL (ref 4.22–5.81)
RDW: 13.8 % (ref 11.5–15.5)
WBC: 11 10*3/uL — ABNORMAL HIGH (ref 4.0–10.5)
nRBC: 0 % (ref 0.0–0.2)

## 2023-04-15 LAB — HEPARIN LEVEL (UNFRACTIONATED)
Heparin Unfractionated: 1.04 [IU]/mL — ABNORMAL HIGH (ref 0.30–0.70)
Heparin Unfractionated: 0.34 [IU]/mL (ref 0.30–0.70)
Heparin Unfractionated: 0.82 [IU]/mL — ABNORMAL HIGH (ref 0.30–0.70)

## 2023-04-15 LAB — T4, FREE: Free T4: 1.28 ng/dL — ABNORMAL HIGH (ref 0.61–1.12)

## 2023-04-15 LAB — ECHOCARDIOGRAM COMPLETE
Area-P 1/2: 1.93 cm2
Calc EF: 53.9 %
Est EF: 55
Height: 66 in
P 1/2 time: 842 ms
S' Lateral: 4.1 cm
Single Plane A2C EF: 49.9 %
Single Plane A4C EF: 56.4 %
Weight: 3262.81 [oz_av]

## 2023-04-15 LAB — PROTIME-INR
INR: 1.5 — ABNORMAL HIGH (ref 0.8–1.2)
Prothrombin Time: 18.3 s — ABNORMAL HIGH (ref 11.4–15.2)

## 2023-04-15 LAB — HEPATIC FUNCTION PANEL
ALT: 66 U/L — ABNORMAL HIGH (ref 0–44)
AST: 55 U/L — ABNORMAL HIGH (ref 15–41)
Albumin: 2.9 g/dL — ABNORMAL LOW (ref 3.5–5.0)
Alkaline Phosphatase: 48 U/L (ref 38–126)
Bilirubin, Direct: 0.6 mg/dL — ABNORMAL HIGH (ref 0.0–0.2)
Indirect Bilirubin: 1.3 mg/dL — ABNORMAL HIGH (ref 0.3–0.9)
Total Bilirubin: 1.9 mg/dL — ABNORMAL HIGH (ref 0.3–1.2)
Total Protein: 5.2 g/dL — ABNORMAL LOW (ref 6.5–8.1)

## 2023-04-15 LAB — HEPATITIS PANEL, ACUTE
HCV Ab: NONREACTIVE
Hep A IgM: NONREACTIVE
Hep B C IgM: NONREACTIVE
Hepatitis B Surface Ag: NONREACTIVE

## 2023-04-15 LAB — TSH: TSH: 0.242 u[IU]/mL — ABNORMAL LOW (ref 0.350–4.500)

## 2023-04-15 LAB — CBG MONITORING, ED
Glucose-Capillary: 81 mg/dL (ref 70–99)
Glucose-Capillary: 71 mg/dL (ref 70–99)

## 2023-04-15 LAB — MAGNESIUM: Magnesium: 2.4 mg/dL (ref 1.7–2.4)

## 2023-04-15 LAB — APTT: aPTT: 200 s (ref 24–36)

## 2023-04-15 MED ORDER — ATROPINE SULFATE 1 MG/ML IV SOLN
1.0000 mg | Freq: Once | INTRAVENOUS | Status: DC
  Filled 2023-04-15: qty 1

## 2023-04-15 MED ORDER — HEPARIN (PORCINE) 25000 UT/250ML-% IV SOLN
750.0000 [IU]/h | INTRAVENOUS | Status: DC
  Administered 2023-04-15 (×2): 1050 [IU]/h via INTRAVENOUS
  Administered 2023-04-16: 750 [IU]/h via INTRAVENOUS
  Filled 2023-04-15 (×2): qty 250

## 2023-04-15 MED ORDER — INFLUENZA VAC A&B SURF ANT ADJ 0.5 ML IM SUSY
0.5000 mL | PREFILLED_SYRINGE | INTRAMUSCULAR | Status: AC
  Administered 2023-04-21: 0.5 mL via INTRAMUSCULAR
  Filled 2023-04-15: qty 0.5

## 2023-04-15 MED ORDER — SODIUM CHLORIDE 0.45 % IV SOLN: INTRAVENOUS | Status: AC

## 2023-04-15 MED ORDER — ATROPINE SULFATE 1 MG/10ML IJ SOSY
1.0000 mg | PREFILLED_SYRINGE | Freq: Once | INTRAMUSCULAR | Status: AC
  Administered 2023-04-15: 1 mg via INTRAVENOUS
  Filled 2023-04-15: qty 10

## 2023-04-15 NOTE — ED Notes (Signed)
This nurse called pharmacist regarding critical PTT/ was told to hold infusion until 0600  Dr. Kirke Corin paged and made aware of low HR and critical PTT/ MD recommended to continue to monitor and follow pharmacy instructions/ MD assessed EKG and is aware that pt remains asymptomatic at this time.

## 2023-04-15 NOTE — Consult Note (Addendum)
0.70 cm      LV E/e' medial:  7.1 LV IVS:        0.70 cm      LV e' lateral:   7.40 cm/s LVOT diam:     2.50 cm      LV E/e' lateral: 6.8 LV SV:         127 LV SV Index:   63 LVOT Area:     4.91 cm  LV Volumes (MOD) LV vol d, MOD A2C: 98.7 ml LV vol d, MOD A4C: 100.0 ml LV vol s, MOD A2C: 49.4 ml LV vol s, MOD A4C: 43.6 ml LV SV MOD A2C:     49.3 ml LV SV MOD A4C:     100.0 ml LV SV MOD BP:      54.7 ml RIGHT VENTRICLE             IVC RV Basal diam:  2.70 cm     IVC diam: 1.40 cm RV S prime:     20.00 cm/s TAPSE (M-mode): 2.2 cm LEFT ATRIUM             Index        RIGHT ATRIUM          Index LA diam:        2.90 cm 1.44 cm/m   RA Area:     7.43 cm LA Vol (A2C):   34.7 ml 17.21 ml/m  RA Volume:   9.46 ml  4.69 ml/m LA Vol (A4C):   31.4 ml 15.57 ml/m LA Biplane Vol: 30.9 ml 15.32 ml/m   AORTIC VALVE LVOT Vmax:   130.00 cm/s LVOT Vmean:  87.200 cm/s LVOT VTI:    0.259 m AI PHT:      842 msec  AORTA Ao Root diam: 3.60 cm Ao Asc diam:  3.80 cm MITRAL VALVE               TRICUSPID VALVE MV Area (PHT): 1.93 cm    TR Peak grad:   7.2 mmHg MV Decel Time: 394 msec    TR Vmax:        134.00 cm/s MV E velocity: 50.10 cm/s MV A velocity: 93.80 cm/s  SHUNTS MV E/A ratio:  0.53        Systemic VTI:  0.26 m                            Systemic Diam: 2.50 cm [name] MD Electronically signed by [name] MD Signature Date/Time: 04/15/2023/3:27:38 PM    Final    VAS [place] LOWER EXTREMITY VENOUS (DVT)  Result Date: 04/15/2023  Lower Venous DVT Study Patient Name:  Samuel [name]  Date of Exam:   04/15/2023 Medical Rec #: [id number]    Accession #:    [id number] Date of Birth: 27-May-1946    Patient Gender: M Patient Age:   77 years Exam Location:  [facility] Procedure:      VAS [place] LOWER EXTREMITY VENOUS (DVT) Referring Phys: [facility] GOEL --------------------------------------------------------------------------------  Indications: Pulmonary embolism.  Comparison Study: No previous exams Performing Technologist: Jody Hill RVT, RDMS  Examination Guidelines: A complete evaluation includes B-mode imaging, spectral Doppler, color Doppler, and power Doppler as needed of all accessible portions of each vessel. Bilateral testing is considered an integral part of a complete examination. Limited examinations for reoccurring indications may be performed as noted. The reflux portion of the exam is performed with  0.70 cm      LV E/e' medial:  7.1 LV IVS:        0.70 cm      LV e' lateral:   7.40 cm/s LVOT diam:     2.50 cm      LV E/e' lateral: 6.8 LV SV:         127 LV SV Index:   63 LVOT Area:     4.91 cm  LV Volumes (MOD) LV vol d, MOD A2C: 98.7 ml LV vol d, MOD A4C: 100.0 ml LV vol s, MOD A2C: 49.4 ml LV vol s, MOD A4C: 43.6 ml LV SV MOD A2C:     49.3 ml LV SV MOD A4C:     100.0 ml LV SV MOD BP:      54.7 ml RIGHT VENTRICLE             IVC RV Basal diam:  2.70 cm     IVC diam: 1.40 cm RV S prime:     20.00 cm/s TAPSE (M-mode): 2.2 cm LEFT ATRIUM             Index        RIGHT ATRIUM          Index LA diam:        2.90 cm 1.44 cm/m   RA Area:     7.43 cm LA Vol (A2C):   34.7 ml 17.21 ml/m  RA Volume:   9.46 ml  4.69 ml/m LA Vol (A4C):   31.4 ml 15.57 ml/m LA Biplane Vol: 30.9 ml 15.32 ml/m   AORTIC VALVE LVOT Vmax:   130.00 cm/s LVOT Vmean:  87.200 cm/s LVOT VTI:    0.259 m AI PHT:      842 msec  AORTA Ao Root diam: 3.60 cm Ao Asc diam:  3.80 cm MITRAL VALVE               TRICUSPID VALVE MV Area (PHT): 1.93 cm    TR Peak grad:   7.2 mmHg MV Decel Time: 394 msec    TR Vmax:        134.00 cm/s MV E velocity: 50.10 cm/s MV A velocity: 93.80 cm/s  SHUNTS MV E/A ratio:  0.53        Systemic VTI:  0.26 m                            Systemic Diam: 2.50 cm [name] MD Electronically signed by [name] MD Signature Date/Time: 04/15/2023/3:27:38 PM    Final    VAS [place] LOWER EXTREMITY VENOUS (DVT)  Result Date: 04/15/2023  Lower Venous DVT Study Patient Name:  Samuel [name]  Date of Exam:   04/15/2023 Medical Rec #: [id number]    Accession #:    [id number] Date of Birth: 27-May-1946    Patient Gender: M Patient Age:   77 years Exam Location:  [facility] Procedure:      VAS [place] LOWER EXTREMITY VENOUS (DVT) Referring Phys: [facility] GOEL --------------------------------------------------------------------------------  Indications: Pulmonary embolism.  Comparison Study: No previous exams Performing Technologist: Jody Hill RVT, RDMS  Examination Guidelines: A complete evaluation includes B-mode imaging, spectral Doppler, color Doppler, and power Doppler as needed of all accessible portions of each vessel. Bilateral testing is considered an integral part of a complete examination. Limited examinations for reoccurring indications may be performed as noted. The reflux portion of the exam is performed with  kg     Body mass index is 32.91 kg/m.  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 Left ventricular ejection fraction, by estimation, is 55%. The left  ventricle has normal function. The left ventricle has no regional wall  motion abnormalities. Left ventricular diastolic parameters are consistent  with Grade I diastolic dysfunction  (impaired relaxation).   2. Right ventricular systolic function is normal. The right ventricular  size is normal. Tricuspid regurgitation signal is inadequate for assessing  PA pressure.   3. The mitral valve is normal in structure. Trivial mitral valve  regurgitation. No evidence of mitral stenosis.   4. The aortic valve is grossly normal. Aortic valve regurgitation is mild  to moderate. Aortic valve sclerosis is present, with no evidence of aortic  valve  stenosis.   5. The inferior vena cava is normal in size with greater than 50%  respiratory variability, suggesting right atrial pressure of 3 mmHg.   Laboratory Data:  High Sensitivity Troponin:   Recent Labs  Lab 04/14/23 1416 04/14/23 1752  TROPONINIHS 56* 67*     Chemistry Recent Labs  Lab 04/14/23 1416 04/15/23 0302 04/15/23 1028  NA 137 138  --   K 3.5 3.5  --   CL 101 106  --   CO2 21* 23  --   GLUCOSE 108* 82  --   BUN 26* 20  --   CREATININE 0.93 0.77  --   CALCIUM 9.4 8.1*  --   MG  --   --  2.4  GFRNONAA >60 >60  --   ANIONGAP 15 9  --     Recent Labs  Lab 04/14/23 1416 04/15/23 0302  PROT 6.8 5.2*  ALBUMIN 3.7 2.9*  AST 82* 55*  ALT 91* 66*  ALKPHOS 57 48  BILITOT 3.0* 1.9*   Lipids No results for input(s): "CHOL", "TRIG", "HDL", "LABVLDL", "LDLCALC", "CHOLHDL" in the last 168 hours.  Hematology Recent Labs  Lab 04/14/23 1416 04/15/23 0302  WBC 13.2* 11.0*  RBC 6.19* 5.12  HGB 18.2* 15.2  HCT 53.8* 45.5  MCV 86.9 88.9  MCH 29.4 29.7  MCHC 33.8 33.4  RDW 13.9 13.8  PLT 211 164   Thyroid No results for input(s): "TSH", "FREET4" in the last 168 hours.  BNPNo results for input(s): "BNP", "PROBNP" in the last 168 hours.  DDimer No results for input(s): "DDIMER" in the last 168 hours.   Radiology/Studies:  ECHOCARDIOGRAM COMPLETE  Result Date: 04/15/2023    ECHOCARDIOGRAM REPORT   Patient Name:   Samuel [name] Date of Exam: 04/15/2023 Medical Rec #:  [id number]   Height:       66.0 in Accession #:    [id number]  Weight:       203.9 lb Date of Birth:  1946/03/09   BSA:  kg     Body mass index is 32.91 kg/m.  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 Left ventricular ejection fraction, by estimation, is 55%. The left  ventricle has normal function. The left ventricle has no regional wall  motion abnormalities. Left ventricular diastolic parameters are consistent  with Grade I diastolic dysfunction  (impaired relaxation).   2. Right ventricular systolic function is normal. The right ventricular  size is normal. Tricuspid regurgitation signal is inadequate for assessing  PA pressure.   3. The mitral valve is normal in structure. Trivial mitral valve  regurgitation. No evidence of mitral stenosis.   4. The aortic valve is grossly normal. Aortic valve regurgitation is mild  to moderate. Aortic valve sclerosis is present, with no evidence of aortic  valve  stenosis.   5. The inferior vena cava is normal in size with greater than 50%  respiratory variability, suggesting right atrial pressure of 3 mmHg.   Laboratory Data:  High Sensitivity Troponin:   Recent Labs  Lab 04/14/23 1416 04/14/23 1752  TROPONINIHS 56* 67*     Chemistry Recent Labs  Lab 04/14/23 1416 04/15/23 0302 04/15/23 1028  NA 137 138  --   K 3.5 3.5  --   CL 101 106  --   CO2 21* 23  --   GLUCOSE 108* 82  --   BUN 26* 20  --   CREATININE 0.93 0.77  --   CALCIUM 9.4 8.1*  --   MG  --   --  2.4  GFRNONAA >60 >60  --   ANIONGAP 15 9  --     Recent Labs  Lab 04/14/23 1416 04/15/23 0302  PROT 6.8 5.2*  ALBUMIN 3.7 2.9*  AST 82* 55*  ALT 91* 66*  ALKPHOS 57 48  BILITOT 3.0* 1.9*   Lipids No results for input(s): "CHOL", "TRIG", "HDL", "LABVLDL", "LDLCALC", "CHOLHDL" in the last 168 hours.  Hematology Recent Labs  Lab 04/14/23 1416 04/15/23 0302  WBC 13.2* 11.0*  RBC 6.19* 5.12  HGB 18.2* 15.2  HCT 53.8* 45.5  MCV 86.9 88.9  MCH 29.4 29.7  MCHC 33.8 33.4  RDW 13.9 13.8  PLT 211 164   Thyroid No results for input(s): "TSH", "FREET4" in the last 168 hours.  BNPNo results for input(s): "BNP", "PROBNP" in the last 168 hours.  DDimer No results for input(s): "DDIMER" in the last 168 hours.   Radiology/Studies:  ECHOCARDIOGRAM COMPLETE  Result Date: 04/15/2023    ECHOCARDIOGRAM REPORT   Patient Name:   Samuel [name] Date of Exam: 04/15/2023 Medical Rec #:  [id number]   Height:       66.0 in Accession #:    [id number]  Weight:       203.9 lb Date of Birth:  1946/03/09   BSA:  Cardiology Consultation   Patient ID: Samuel [name] MRN: [id number]; DOB: 06-28-1946  Admit date: 04/14/2023 Date of Consult: 04/15/2023  PCP:  [name], MD   [facility] Providers Cardiologist:  Tessa Lerner, DO   Patient Profile:   Samuel [name] is a 77 y.o. male with a hx of DM and no prior cardiac history who is being seen 04/15/2023 for the evaluation of bradycardia at the request of Dr. Caleb Popp.  History of Present Illness:   Mr. 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  He has no prior cardiac history.  He presented to the ER 04/14/2023 for a sensation of food getting stuck in his throat causing pain in his diaphragm area radiating to his right chest that was worse with deep inspiration.  Workup was significant for acute pulmonary embolism in the right main pulmonary artery extending into the upper and lower lobes.  He was started on heparin and was noted to have mild troponin elevation felt related to PE.  Echo performed and showed LVEF 55% with grade 1 DD, no significant valvular disease.  There was no right heart strain on echo.  Initial EKG showed sinus rhythm with nonconducted p wave, IVCD difficult to determine.  He was admitted for IV heparin for PE and workup for dysphagia.  Overnight telemetry showed asymptomatic bradycardia with evidence of second-degree heart block type II.  Cardiology was consulted. EKG today appears second degree type 2 AB block HR 30, RBBB. No AV nodal agents at baseline.   He states he has been having dysphagia for 3 weeks, unable to swallow medication yesterday. He reports dizziness x 3 days, no syncope. He also reports shallow breathing and CP with deep inspiration x 3 days.    Past Medical History:  Diagnosis Date   Diabetes mellitus without complication (HCC)     Past Surgical History:  Procedure Laterality Date   KNEE  ARTHROPLASTY Bilateral      Home Medications:  Prior to Admission medications   Medication Sig Start Date End Date Taking? Authorizing Provider  metFORMIN (GLUCOPHAGE) 500 MG tablet Take 1 tablet (500 mg total) by mouth daily with breakfast. 12/19/22  Yes [name], MD  Multiple Vitamins-Minerals (MULTIVITAMIN MEN) TABS Take 1 tablet by mouth daily with breakfast.   Yes [provider]  Blood Glucose Monitoring Suppl (ACCU-CHEK GUIDE ME) w/Device KIT USE AS DIRECTED 3 TIMES A DAY 01/16/23   [name], MD  glucose blood (ACCU-CHEK GUIDE) test strip Use as instructed daily 12/19/22   [name], MD    Inpatient Medications: Scheduled Meds:  [START ON 04/16/2023] influenza vaccine adjuvanted  0.5 mL Intramuscular Tomorrow-1000   insulin aspart  0-15 Units Subcutaneous TID WC   pantoprazole (PROTONIX) IV  40 mg Intravenous QHS   Continuous Infusions:  sodium chloride 40 mL/hr at 04/15/23 1034   heparin 1,050 Units/hr (04/15/23 1527)   PRN Meds: acetaminophen **OR** acetaminophen, polyethylene glycol  Allergies:    Allergies  Allergen Reactions   Penicillins     Shortness of breath   Sulfa Antibiotics     Blisters    Procaine Other (See Comments)    Caused fatigue    Social History:   Social History   Socioeconomic History   Marital status: Married    Spouse name: Not on file   Number of children: Not on file   Years of  kg     Body mass index is 32.91 kg/m.  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 Left ventricular ejection fraction, by estimation, is 55%. The left  ventricle has normal function. The left ventricle has no regional wall  motion abnormalities. Left ventricular diastolic parameters are consistent  with Grade I diastolic dysfunction  (impaired relaxation).   2. Right ventricular systolic function is normal. The right ventricular  size is normal. Tricuspid regurgitation signal is inadequate for assessing  PA pressure.   3. The mitral valve is normal in structure. Trivial mitral valve  regurgitation. No evidence of mitral stenosis.   4. The aortic valve is grossly normal. Aortic valve regurgitation is mild  to moderate. Aortic valve sclerosis is present, with no evidence of aortic  valve  stenosis.   5. The inferior vena cava is normal in size with greater than 50%  respiratory variability, suggesting right atrial pressure of 3 mmHg.   Laboratory Data:  High Sensitivity Troponin:   Recent Labs  Lab 04/14/23 1416 04/14/23 1752  TROPONINIHS 56* 67*     Chemistry Recent Labs  Lab 04/14/23 1416 04/15/23 0302 04/15/23 1028  NA 137 138  --   K 3.5 3.5  --   CL 101 106  --   CO2 21* 23  --   GLUCOSE 108* 82  --   BUN 26* 20  --   CREATININE 0.93 0.77  --   CALCIUM 9.4 8.1*  --   MG  --   --  2.4  GFRNONAA >60 >60  --   ANIONGAP 15 9  --     Recent Labs  Lab 04/14/23 1416 04/15/23 0302  PROT 6.8 5.2*  ALBUMIN 3.7 2.9*  AST 82* 55*  ALT 91* 66*  ALKPHOS 57 48  BILITOT 3.0* 1.9*   Lipids No results for input(s): "CHOL", "TRIG", "HDL", "LABVLDL", "LDLCALC", "CHOLHDL" in the last 168 hours.  Hematology Recent Labs  Lab 04/14/23 1416 04/15/23 0302  WBC 13.2* 11.0*  RBC 6.19* 5.12  HGB 18.2* 15.2  HCT 53.8* 45.5  MCV 86.9 88.9  MCH 29.4 29.7  MCHC 33.8 33.4  RDW 13.9 13.8  PLT 211 164   Thyroid No results for input(s): "TSH", "FREET4" in the last 168 hours.  BNPNo results for input(s): "BNP", "PROBNP" in the last 168 hours.  DDimer No results for input(s): "DDIMER" in the last 168 hours.   Radiology/Studies:  ECHOCARDIOGRAM COMPLETE  Result Date: 04/15/2023    ECHOCARDIOGRAM REPORT   Patient Name:   Samuel [name] Date of Exam: 04/15/2023 Medical Rec #:  [id number]   Height:       66.0 in Accession #:    [id number]  Weight:       203.9 lb Date of Birth:  1946/03/09   BSA:  kg     Body mass index is 32.91 kg/m.  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 Left ventricular ejection fraction, by estimation, is 55%. The left  ventricle has normal function. The left ventricle has no regional wall  motion abnormalities. Left ventricular diastolic parameters are consistent  with Grade I diastolic dysfunction  (impaired relaxation).   2. Right ventricular systolic function is normal. The right ventricular  size is normal. Tricuspid regurgitation signal is inadequate for assessing  PA pressure.   3. The mitral valve is normal in structure. Trivial mitral valve  regurgitation. No evidence of mitral stenosis.   4. The aortic valve is grossly normal. Aortic valve regurgitation is mild  to moderate. Aortic valve sclerosis is present, with no evidence of aortic  valve  stenosis.   5. The inferior vena cava is normal in size with greater than 50%  respiratory variability, suggesting right atrial pressure of 3 mmHg.   Laboratory Data:  High Sensitivity Troponin:   Recent Labs  Lab 04/14/23 1416 04/14/23 1752  TROPONINIHS 56* 67*     Chemistry Recent Labs  Lab 04/14/23 1416 04/15/23 0302 04/15/23 1028  NA 137 138  --   K 3.5 3.5  --   CL 101 106  --   CO2 21* 23  --   GLUCOSE 108* 82  --   BUN 26* 20  --   CREATININE 0.93 0.77  --   CALCIUM 9.4 8.1*  --   MG  --   --  2.4  GFRNONAA >60 >60  --   ANIONGAP 15 9  --     Recent Labs  Lab 04/14/23 1416 04/15/23 0302  PROT 6.8 5.2*  ALBUMIN 3.7 2.9*  AST 82* 55*  ALT 91* 66*  ALKPHOS 57 48  BILITOT 3.0* 1.9*   Lipids No results for input(s): "CHOL", "TRIG", "HDL", "LABVLDL", "LDLCALC", "CHOLHDL" in the last 168 hours.  Hematology Recent Labs  Lab 04/14/23 1416 04/15/23 0302  WBC 13.2* 11.0*  RBC 6.19* 5.12  HGB 18.2* 15.2  HCT 53.8* 45.5  MCV 86.9 88.9  MCH 29.4 29.7  MCHC 33.8 33.4  RDW 13.9 13.8  PLT 211 164   Thyroid No results for input(s): "TSH", "FREET4" in the last 168 hours.  BNPNo results for input(s): "BNP", "PROBNP" in the last 168 hours.  DDimer No results for input(s): "DDIMER" in the last 168 hours.   Radiology/Studies:  ECHOCARDIOGRAM COMPLETE  Result Date: 04/15/2023    ECHOCARDIOGRAM REPORT   Patient Name:   Samuel [name] Date of Exam: 04/15/2023 Medical Rec #:  [id number]   Height:       66.0 in Accession #:    [id number]  Weight:       203.9 lb Date of Birth:  1946/03/09   BSA:  0.70 cm      LV E/e' medial:  7.1 LV IVS:        0.70 cm      LV e' lateral:   7.40 cm/s LVOT diam:     2.50 cm      LV E/e' lateral: 6.8 LV SV:         127 LV SV Index:   63 LVOT Area:     4.91 cm  LV Volumes (MOD) LV vol d, MOD A2C: 98.7 ml LV vol d, MOD A4C: 100.0 ml LV vol s, MOD A2C: 49.4 ml LV vol s, MOD A4C: 43.6 ml LV SV MOD A2C:     49.3 ml LV SV MOD A4C:     100.0 ml LV SV MOD BP:      54.7 ml RIGHT VENTRICLE             IVC RV Basal diam:  2.70 cm     IVC diam: 1.40 cm RV S prime:     20.00 cm/s TAPSE (M-mode): 2.2 cm LEFT ATRIUM             Index        RIGHT ATRIUM          Index LA diam:        2.90 cm 1.44 cm/m   RA Area:     7.43 cm LA Vol (A2C):   34.7 ml 17.21 ml/m  RA Volume:   9.46 ml  4.69 ml/m LA Vol (A4C):   31.4 ml 15.57 ml/m LA Biplane Vol: 30.9 ml 15.32 ml/m   AORTIC VALVE LVOT Vmax:   130.00 cm/s LVOT Vmean:  87.200 cm/s LVOT VTI:    0.259 m AI PHT:      842 msec  AORTA Ao Root diam: 3.60 cm Ao Asc diam:  3.80 cm MITRAL VALVE               TRICUSPID VALVE MV Area (PHT): 1.93 cm    TR Peak grad:   7.2 mmHg MV Decel Time: 394 msec    TR Vmax:        134.00 cm/s MV E velocity: 50.10 cm/s MV A velocity: 93.80 cm/s  SHUNTS MV E/A ratio:  0.53        Systemic VTI:  0.26 m                            Systemic Diam: 2.50 cm [name] MD Electronically signed by [name] MD Signature Date/Time: 04/15/2023/3:27:38 PM    Final    VAS [place] LOWER EXTREMITY VENOUS (DVT)  Result Date: 04/15/2023  Lower Venous DVT Study Patient Name:  Samuel [name]  Date of Exam:   04/15/2023 Medical Rec #: [id number]    Accession #:    [id number] Date of Birth: 27-May-1946    Patient Gender: M Patient Age:   77 years Exam Location:  [facility] Procedure:      VAS [place] LOWER EXTREMITY VENOUS (DVT) Referring Phys: [facility] GOEL --------------------------------------------------------------------------------  Indications: Pulmonary embolism.  Comparison Study: No previous exams Performing Technologist: Jody Hill RVT, RDMS  Examination Guidelines: A complete evaluation includes B-mode imaging, spectral Doppler, color Doppler, and power Doppler as needed of all accessible portions of each vessel. Bilateral testing is considered an integral part of a complete examination. Limited examinations for reoccurring indications may be performed as noted. The reflux portion of the exam is performed with  kg     Body mass index is 32.91 kg/m.  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 Left ventricular ejection fraction, by estimation, is 55%. The left  ventricle has normal function. The left ventricle has no regional wall  motion abnormalities. Left ventricular diastolic parameters are consistent  with Grade I diastolic dysfunction  (impaired relaxation).   2. Right ventricular systolic function is normal. The right ventricular  size is normal. Tricuspid regurgitation signal is inadequate for assessing  PA pressure.   3. The mitral valve is normal in structure. Trivial mitral valve  regurgitation. No evidence of mitral stenosis.   4. The aortic valve is grossly normal. Aortic valve regurgitation is mild  to moderate. Aortic valve sclerosis is present, with no evidence of aortic  valve  stenosis.   5. The inferior vena cava is normal in size with greater than 50%  respiratory variability, suggesting right atrial pressure of 3 mmHg.   Laboratory Data:  High Sensitivity Troponin:   Recent Labs  Lab 04/14/23 1416 04/14/23 1752  TROPONINIHS 56* 67*     Chemistry Recent Labs  Lab 04/14/23 1416 04/15/23 0302 04/15/23 1028  NA 137 138  --   K 3.5 3.5  --   CL 101 106  --   CO2 21* 23  --   GLUCOSE 108* 82  --   BUN 26* 20  --   CREATININE 0.93 0.77  --   CALCIUM 9.4 8.1*  --   MG  --   --  2.4  GFRNONAA >60 >60  --   ANIONGAP 15 9  --     Recent Labs  Lab 04/14/23 1416 04/15/23 0302  PROT 6.8 5.2*  ALBUMIN 3.7 2.9*  AST 82* 55*  ALT 91* 66*  ALKPHOS 57 48  BILITOT 3.0* 1.9*   Lipids No results for input(s): "CHOL", "TRIG", "HDL", "LABVLDL", "LDLCALC", "CHOLHDL" in the last 168 hours.  Hematology Recent Labs  Lab 04/14/23 1416 04/15/23 0302  WBC 13.2* 11.0*  RBC 6.19* 5.12  HGB 18.2* 15.2  HCT 53.8* 45.5  MCV 86.9 88.9  MCH 29.4 29.7  MCHC 33.8 33.4  RDW 13.9 13.8  PLT 211 164   Thyroid No results for input(s): "TSH", "FREET4" in the last 168 hours.  BNPNo results for input(s): "BNP", "PROBNP" in the last 168 hours.  DDimer No results for input(s): "DDIMER" in the last 168 hours.   Radiology/Studies:  ECHOCARDIOGRAM COMPLETE  Result Date: 04/15/2023    ECHOCARDIOGRAM REPORT   Patient Name:   Samuel [name] Date of Exam: 04/15/2023 Medical Rec #:  [id number]   Height:       66.0 in Accession #:    [id number]  Weight:       203.9 lb Date of Birth:  1946/03/09   BSA:

## 2023-04-15 NOTE — Progress Notes (Addendum)
PROGRESS NOTE    Samuel [name]  BMW:[id number] DOB: 12-24-1945 DOA: 04/14/2023 PCP: [name], MD   Brief Narrative: Samuel [name] is a 77 y.o. male with a history of diabetes mellitus.  Patient presented secondary to dysphagia and pleuritic chest/upper abdominal pain. On presentation, patient was found to have a right sided PE extending to upper and lower lobes. Heparin IV started for treatment. GI was consulted for dysphagia. During admission, patient was found to have recurrent severe asymptomatic bradycardia with EKG evidence of second degree heart block type 2. Cardiology consulted and patient will transfer to [facility].   Assessment/Plan:  Acute pulmonary embolism Involving the right main artery with extension into the upper and lower lobes. With CT findings of the esophagus, concern is this may be related to underlying malignancy. No CT evidence of RV strain. Patient started on Heparin IV. Transthoracic Echocardiogram ordered and pending -Continue Heparin IV -Follow-up Transthoracic Echocardiogram -Follow-up LE venous duplex study  Second degree heart block type 2 Noted on EKG from this morning. Bradycardia with heart rates down to 30 bpm. Patient at this time is asymptomatic with no associated hypotension. Discussed with cardiology with recommendations for atropine 1 mg, pacer pads and transfer to [facility]. -Pacer pads -Continue telemetry -Transfer to progressive unit -Check magnesium  Dysphagia CT imaging was significant for wall thickening of the lower esophagus concerning for esophagitis vs malignancy. [facility] was consulted and are following. -GI recommendations: barium swallow prior to endoscopic evaluation  Demand ischemia Mildly elevated troponin in setting of acute PE. No chest pain. -Follow-up Transthoracic Echocardiogram  Swelling of  clavicular region Not noted on CT imaging. Uncertain etiology. Will need outpatient follow-up  Elevated  AST/ALT Hyperbilirubinemia Concern for possible cholecystitis. Abdominal imaging significant for cholelithiasis and sludge. Patient without RUQ tenderness. -CMP in AM  Dehydration -Continue IV fluids -Continue clear liquid diet  Diabetes mellitus type 2 Controlled with hemoglobin A1C of 7.0 from June 2024. Patient is on metformin as an outpatient. -Continue SSI; remove bedtime coverage  Fatty liver Noted on abdominal ultrasound imaging.   DVT prophylaxis: Heparin IV Code Status:   Code Status: Full Code Family Communication: None at bedside Disposition Plan: Transfer to [facility].   Consultants:  [facility] Gastroenterology Cardiology  Procedures:  None  Antimicrobials: None    Subjective: Patient reports no dyspnea or chest pain currently at rest. Worried about his inability to swallow. He is concerned about his esophagus and gallbladder. No RUQ abdominal tenderness at this time.  Objective: BP (!) 153/96   Pulse 72   Temp 98.6 F (37 C) (Oral)   Resp 18   Ht 5\' 6"  (1.676 m)   Wt 92.5 kg   SpO2 96%   BMI 32.91 kg/m   Examination:  General exam: Appears calm and comfortable Respiratory system: Clear to auscultation. Respiratory effort normal. Cardiovascular system: S1 & S2 heard, slow heart rate with regular rhythm. Gastrointestinal system: Abdomen is nondistended, soft and nontender.  Normal bowel sounds heard. Central nervous system: Alert and oriented. No focal neurological deficits. Musculoskeletal: No edema. No calf tenderness Psychiatry: Judgement and insight appear normal. Mood & affect appropriate.    Data Reviewed: I have personally reviewed following labs and imaging studies   Last CBC Lab Results  Component Value Date   WBC 11.0 (H) 04/15/2023   HGB 15.2 04/15/2023   HCT 45.5 04/15/2023   MCV 88.9 04/15/2023   MCH 29.7 04/15/2023   RDW 13.8 04/15/2023  PLT 164 04/15/2023     Last metabolic panel Lab Results  Component Value  Date   GLUCOSE 82 04/15/2023   NA 138 04/15/2023   K 3.5 04/15/2023   CL 106 04/15/2023   CO2 23 04/15/2023   BUN 20 04/15/2023   CREATININE 0.77 04/15/2023   GFRNONAA >60 04/15/2023   CALCIUM 8.1 (L) 04/15/2023   PROT 5.2 (L) 04/15/2023   ALBUMIN 2.9 (L) 04/15/2023   LABGLOB 2.3 08/09/2022   AGRATIO 1.8 08/09/2022   BILITOT 1.9 (H) 04/15/2023   ALKPHOS 48 04/15/2023   AST 55 (H) 04/15/2023   ALT 66 (H) 04/15/2023   ANIONGAP 9 04/15/2023     Creatinine Clearance: Estimated Creatinine Clearance: 82.4 mL/min (by C-G formula based on SCr of 0.77 mg/dL).  No results found for this or any previous visit (from the past 240 hour(s)).    Radiology Studies: US Abdomen Limited RUQ (LIVER/GB)  Result Date: 04/15/2023 CLINICAL DATA:  Elevated LFTs EXAM: ULTRASOUND ABDOMEN LIMITED RIGHT UPPER QUADRANT COMPARISON:  Chest CT today FINDINGS: Gallbladder: Stones and sludge seen within the gallbladder. No wall thickening. The patient was tender over the gallbladder during the study. Common bile duct: Diameter: Normal caliber, 3 mm Liver: Increased echotexture compatible with fatty infiltration. No focal abnormality or biliary ductal dilatation. Portal vein is patent on color Doppler imaging with normal direction of blood flow towards the liver. Other: None. IMPRESSION: Gallstones and sludge within the gallbladder. The patient was tender over the gallbladder during the study. Recommend clinical correlation for possible cholecystitis. Fatty liver. Electronically Signed   By: Charlett Nose M.D.   On: 04/15/2023 00:20   CT Soft Tissue Neck W Contrast  Result Date: 04/14/2023 CLINICAL DATA:  Initial evaluation for acute difficulty swallowing over past few days. EXAM: CT NECK WITH CONTRAST TECHNIQUE: Multidetector CT imaging of the neck was performed using the standard protocol following the bolus administration of intravenous contrast. RADIATION DOSE REDUCTION: This exam was performed according to the  departmental dose-optimization program which includes automated exposure control, adjustment of the mA and/or kV according to patient size and/or use of iterative reconstruction technique. CONTRAST:  OMNIPAQUE IOHEXOL 300 MG/ML  SOLN COMPARISON:  None Available. FINDINGS: Pharynx and larynx: Oral cavity within normal limits. Oropharynx and nasopharynx within normal limits. No retropharyngeal collection or swelling. Epiglottis within normal limits. Hypopharynx and supraglottic larynx normal. Glottis symmetric and normal. Subglottic airway patent clear. Salivary glands: Salivary glands including the parotid and submandibular glands are within normal limits. Thyroid: Normal. Lymph nodes: No enlarged or pathologic adenopathy. Vascular: Normal intravascular enhancement seen within the neck. Mild atheromatous change about the carotid bifurcations. Limited intracranial: Unremarkable. Visualized orbits: Unremarkable. Mastoids and visualized paranasal sinuses: Clear/normal. Skeleton: 6 mm sclerotic lesion within the T2 vertebral body noted, nonspecific. No other worrisome osseous lesions. 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 Upper chest: No acute finding. Other: None. IMPRESSION: 1. Negative CT of the neck. No acute inflammatory changes or other abnormality identified. 2. 6 mm sclerotic lesion within the T2 vertebral body, favored benign, but incompletely assessed on this exam. Further evaluation with dedicated MRI, with and without contrast, recommended for complete evaluation. Electronically Signed   By: Rise Mu M.D.   On: 04/14/2023 17:50   CT Chest W Contrast  Result Date: 04/14/2023 CLINICAL DATA:  Dysphagia, difficulty swallowing even liquids over the last few days, nausea, vomiting EXAM: CT CHEST WITH CONTRAST TECHNIQUE: Multidetector CT imaging of  the chest was performed during intravenous contrast administration. RADIATION DOSE  REDUCTION: This exam was performed according to the departmental dose-optimization program which includes automated exposure control, adjustment of the mA and/or kV according to patient size and/or use of iterative reconstruction technique. CONTRAST:  OMNIPAQUE IOHEXOL 300 MG/ML  SOLN COMPARISON:  Chest radiograph earlier today FINDINGS: Cardiovascular: Acute pulmonary emboli at the bifurcation of the right mainstem bronchus extending into the right upper lobe and right lower lobe pulmonary arteries. No evidence of right heart strain. No pericardial effusion. Aortic atherosclerotic calcification. Mediastinum/Nodes: Wall thickening of the lower esophagus with layering debris in the esophagus upstream from the area of thickening. Trace fluid about the lower esophagus. Trachea is unremarkable. No lymphadenopathy. Lungs/Pleura: No focal consolidation, pleural effusion, or pneumothorax. 4 mm ground-glass nodule in the left lower lobe, likely infectious/inflammatory. Per Fleischner Society guidelines, no routine follow-up imaging is recommended. These guidelines do not apply to immunocompromised patients and patients with cancer. Follow up in patients with significant comorbidities as clinically warranted. For lung cancer screening, adhere to Lung-RADS guidelines. Reference: Radiology. 2017; 284(1):228-43. Upper Abdomen: No acute abnormality. Musculoskeletal: No acute fracture. IMPRESSION: 1. Acute pulmonary embolism in the right main pulmonary artery extending into the upper and lower lobes. No evidence of right heart strain. 2. Wall thickening of the lower esophagus with layering debris upstream from the area of thickening. Trace fluid about the lower esophagus. This may be due to esophagitis however correlation with endoscopy is recommended to exclude malignancy. Aortic Atherosclerosis (ICD10-I70.0). Critical Value/emergent results were called by telephone at the time of interpretation on 04/14/2023 at 5:32 pm to  provider Sabra Heck , who verbally acknowledged these results. Electronically Signed   By: Minerva Fester M.D.   On: 04/14/2023 17:32   DG Chest Port 1 View  Result Date: 04/14/2023 CLINICAL DATA:  Chest discomfort EXAM: PORTABLE CHEST 1 VIEW COMPARISON:  None Available. FINDINGS: The heart size and mediastinal contours are within normal limits. Bibasilar scarring/atelectasis. The lungs are otherwise clear. Advanced arthritis left shoulder. Aortic atherosclerotic calcification. IMPRESSION: No active disease. Electronically Signed   By: Minerva Fester M.D.   On: 04/14/2023 15:57      LOS: 1 day    Jacquelin Hawking, MD [facility] 04/15/2023, 9:47 AM   If 7PM-7AM, please contact night-coverage [email or web address]

## 2023-04-15 NOTE — ED Notes (Signed)
Paged correct on-call provider for this pt. Waiting for orders.

## 2023-04-15 NOTE — Progress Notes (Signed)
PHARMACY - ANTICOAGULATION CONSULT NOTE  Pharmacy Consult for heparin Indication: acute pulmonary embolus  Allergies  Allergen Reactions   Penicillins     Shortness of breath   Sulfa Antibiotics     Blisters    Procaine Other (See Comments)    Caused fatigue    Patient Measurements: Height: 5\' 6"  (167.6 cm) Weight: 92.5 kg (203 lb 14.8 oz) IBW/kg (Calculated) : 63.8 Heparin Dosing Weight: 84 kg  Vital Signs: Temp: 98.3 F (36.8 C) (10/12 0315) Temp Source: Oral (10/12 0315) BP: 117/54 (10/12 0245) Pulse Rate: 57 (10/12 0245)  Labs: Recent Labs    04/14/23 1416 04/14/23 1752 04/15/23 0300 04/15/23 0302  HGB 18.2*  --   --  15.2  HCT 53.8*  --   --  45.5  PLT 211  --   --  164  APTT  --   --   --  200*  LABPROT  --   --   --  18.3*  INR  --   --   --  1.5*  HEPARINUNFRC  --   --  1.04*  --   CREATININE 0.93  --   --  0.77  TROPONINIHS 56* 67*  --   --     Estimated Creatinine Clearance: 82.4 mL/min (by C-G formula based on SCr of 0.77 mg/dL).   Medical History: Past Medical History:  Diagnosis Date   Diabetes mellitus without complication [facility])      Assessment: Patient is a 77 y.o M who presented to the ED on 04/14/23 with c/o CP and difficulty swallowing. Chest CT showed acute PE in the right main pulmonary artery extending into the upper and lower lobes with no evidence of RHS. Pharmacy has been consulted to dose heparin for VTE treatment. He is not on anticoagulation PTA.  Baseline labs WNL.  Today, 04/15/2023: Heparin level 1.04- elevated on IV heparin 13.5 ml/hr.  Verified with RN that lab was drawn correctly from opposite arm as heparin infusing.  CBC WNL No bleeding or infusion related concerns reported by RN  Goal of Therapy:  Heparin level 0.3-0.7 units/ml Monitor platelets by anticoagulation protocol: Yes   Plan:  Hold heparin x1 hour then resume heparin infusion at 1050 units/hr Check heparin level 8h after infusion resumed Monitor for  s/sx bleeding Daily heparin level & CBC while on heparin F/U plans for long-term anticoagulation  Junita Push PharmD 04/15/2023,4:43 AM

## 2023-04-15 NOTE — ED Notes (Signed)
CPR Pads were placed as attending doctor has asked

## 2023-04-15 NOTE — Progress Notes (Signed)
PHARMACY - ANTICOAGULATION CONSULT NOTE  Pharmacy Consult for heparin Indication: pulmonary embolus  Labs: Recent Labs    04/14/23 1416 04/14/23 1752 04/15/23 0300 04/15/23 0302 04/15/23 1247 04/15/23 2224  HGB 18.2*  --   --  15.2  --   --   HCT 53.8*  --   --  45.5  --   --   PLT 211  --   --  164  --   --   APTT  --   --   --  200*  --   --   LABPROT  --   --   --  18.3*  --   --   INR  --   --   --  1.5*  --   --   HEPARINUNFRC  --   --  1.04*  --  0.34 0.82*  CREATININE 0.93  --   --  0.77  --   --   TROPONINIHS 56* 67*  --   --   --   --     Assessment: 77yo male supratherapeutic on heparin after one level at lower end of goal; no infusion issues or signs of bleeding per RN.  Goal of Therapy:  Heparin level 0.3-0.7 units/ml   Plan:  Decrease heparin infusion by 1 unit/kg/hr to 950 units/hr. Check level in 6-8 hours.   Vernard Gambles, PharmD, BCPS 04/15/2023 10:57 PM

## 2023-04-15 NOTE — ED Notes (Signed)
This nurse Sharmon Leyden, MD regarding pt HR x2 over the shift/ no new orders placed/ pt is asymptomatic/ no complaints at this time

## 2023-04-15 NOTE — Progress Notes (Signed)
  Echocardiogram 2D Echocardiogram has been performed.  Milda Smart 04/15/2023, 2:38 PM

## 2023-04-15 NOTE — ED Notes (Signed)
Report given to [facility]   they stated room isnt clean yet   I will call [facility] once room is ready for transport

## 2023-04-15 NOTE — Consult Note (Signed)
Reason for Consult: Dysphagia abnormal CT Referring Physician: Hospital team  Samuel [name] is an 77 y.o. male.  HPI: Patient seen and examined and his hospital computer chart reviewed and his case discussed with the hospital team and he would only have reflux if he ate spicy food too late at night but over the last month he has had increasing dysphagia first to solids and lately to liquids and he has never seen a gastroenterologist or had any GI tests although he did do what sounds like a Cologuard test a year ago which was okay and he did say metformin caused some diarrhea but it resolved when he stopped it and he has lost some weight but he is not sure and he presents to the hospital with a pulmonary embolus and he has no other specific complaints and his family history is negative from a GI standpoint  Past Medical History:  Diagnosis Date   Diabetes mellitus without complication (HCC)     Past Surgical History:  Procedure Laterality Date   KNEE ARTHROPLASTY Bilateral     Family History  Problem Relation Age of Onset   Hypertension Father    COPD Father    COPD Sister    Diabetes Maternal Grandfather     Social History:  reports that he quit smoking about 37 years ago. His smoking use included cigarettes. He started smoking about 52 years ago. He has a 15 pack-year smoking history. He has never used smokeless tobacco. He reports current alcohol use of about 1.0 - 2.0 standard drink of alcohol per week. He reports that he does not use drugs.  Allergies:  Allergies  Allergen Reactions   Penicillins     Shortness of breath   Sulfa Antibiotics     Blisters    Procaine Other (See Comments)    Caused fatigue    Medications: I have reviewed the patient's current medications.  Results for orders placed or performed during the hospital encounter of 04/14/23 (from the past 48 hour(s))  CBC with Differential     Status: Abnormal   Collection Time: 04/14/23  2:16 PM  Result Value Ref  Range   WBC 13.2 (H) 4.0 - 10.5 K/uL   RBC 6.19 (H) 4.22 - 5.81 MIL/uL   Hemoglobin 18.2 (H) 13.0 - 17.0 g/dL   HCT 16.1 (H) 09.6 - 04.5 %   MCV 86.9 80.0 - 100.0 fL   MCH 29.4 26.0 - 34.0 pg   MCHC 33.8 30.0 - 36.0 g/dL   RDW 40.9 81.1 - 91.4 %   Platelets 211 150 - 400 K/uL   nRBC 0.0 0.0 - 0.2 %   Neutrophils Relative % 71 %   Neutro Abs 9.4 (H) 1.7 - 7.7 K/uL   Lymphocytes Relative 18 %   Lymphs Abs 2.3 0.7 - 4.0 K/uL   Monocytes Relative 10 %   Monocytes Absolute 1.3 (H) 0.1 - 1.0 K/uL   Eosinophils Relative 0 %   Eosinophils Absolute 0.0 0.0 - 0.5 K/uL   Basophils Relative 0 %   Basophils Absolute 0.0 0.0 - 0.1 K/uL   Immature Granulocytes 1 %   Abs Immature Granulocytes 0.09 (H) 0.00 - 0.07 K/uL    Comment: Performed at [facility], [address], [place], [place] [address]  Comprehensive metabolic panel     Status: Abnormal   Collection Time: 04/14/23  2:16 PM  Result Value Ref Range   Sodium 137 135 - 145 mmol/L   Potassium 3.5  3.5 - 5.1 mmol/L   Chloride 101 98 - 111 mmol/L   CO2 21 (L) 22 - 32 mmol/L   Glucose, Bld 108 (H) 70 - 99 mg/dL    Comment: Glucose reference range applies only to samples taken after fasting for at least 8 hours.   BUN 26 (H) 8 - 23 mg/dL   Creatinine, Ser 4.09 0.61 - 1.24 mg/dL   Calcium 9.4 8.9 - 81.1 mg/dL   Total Protein 6.8 6.5 - 8.1 g/dL   Albumin 3.7 3.5 - 5.0 g/dL   AST 82 (H) 15 - 41 U/L   ALT 91 (H) 0 - 44 U/L   Alkaline Phosphatase 57 38 - 126 U/L   Total Bilirubin 3.0 (H) 0.3 - 1.2 mg/dL   GFR, Estimated >91 >47 mL/min    Comment: (NOTE) Calculated using the CKD-EPI Creatinine Equation (2021)    Anion gap 15 5 - 15    Comment: Performed at [facility], [address] [place], [place] [address]  Troponin I (High Sensitivity)     Status: Abnormal   Collection Time: 04/14/23  2:16 PM  Result Value Ref Range   Troponin I (High Sensitivity) 56 (H) <18 ng/L    Comment:  (NOTE) Elevated high sensitivity troponin I (hsTnI) values and significant  changes across serial measurements may suggest ACS but many other  chronic and acute conditions are known to elevate hsTnI results.  Refer to the "Links" section for chest pain algorithms and additional  guidance. Performed at [facility], [address] [place], [place] [address]   Lipase, blood     Status: None   Collection Time: 04/14/23  2:16 PM  Result Value Ref Range   Lipase 41 11 - 51 U/L    Comment: Performed at [facility], [address] [place], [place] [address]  Troponin I (High Sensitivity)     Status: Abnormal   Collection Time: 04/14/23  5:52 PM  Result Value Ref Range   Troponin I (High Sensitivity) 67 (H) <18 ng/L    Comment: (NOTE) Elevated high sensitivity troponin I (hsTnI) values and significant  changes across serial measurements may suggest ACS but many other  chronic and acute conditions are known to elevate hsTnI results.  Refer to the "Links" section for chest pain algorithms and additional  guidance. Performed at [facility], [address], [place], [place] [address]   CBG monitoring, ED     Status: None   Collection Time: 04/14/23 10:27 PM  Result Value Ref Range   Glucose-Capillary 88 70 - 99 mg/dL    Comment: Glucose reference range applies only to samples taken after fasting for at least 8 hours.  Heparin level (unfractionated)     Status: Abnormal   Collection Time: 04/15/23  3:00 AM  Result Value Ref Range   Heparin Unfractionated 1.04 (H) 0.30 - 0.70 IU/mL    Comment: (NOTE) The clinical reportable range upper limit is being lowered to >1.10 to align with the FDA approved guidance for the current laboratory assay.  If heparin results are below expected values, and patient dosage has  been confirmed, suggest follow up testing of antithrombin III levels. Performed at [facility], [address] [place], [place] [address]   CBC     Status: Abnormal   Collection Time: 04/15/23  3:02 AM  Result Value Ref Range   WBC 11.0 (H) 4.0 - 10.5 K/uL   RBC  5.12 4.22 - 5.81 MIL/uL   Hemoglobin 15.2 13.0 - 17.0 g/dL   HCT 14.7 82.9 - 56.2 %   MCV 88.9 80.0 - 100.0 fL   MCH 29.7 26.0 - 34.0 pg   MCHC 33.4 30.0 - 36.0 g/dL   RDW 13.0 86.5 - 78.4 %   Platelets 164 150 - 400 K/uL   nRBC 0.0 0.0 - 0.2 %    Comment: Performed at [facility], [address] [place], [place] [address]  Hepatic function panel     Status: Abnormal   Collection Time: 04/15/23  3:02 AM  Result Value Ref Range   Total Protein 5.2 (L) 6.5 - 8.1 g/dL   Albumin 2.9 (L) 3.5 - 5.0 g/dL   AST 55 (H) 15 - 41 U/L   ALT 66 (H) 0 - 44 U/L   Alkaline Phosphatase 48 38 - 126 U/L   Total Bilirubin 1.9 (H) 0.3 - 1.2 mg/dL   Bilirubin, Direct 0.6 (H) 0.0 - 0.2 mg/dL   Indirect Bilirubin 1.3 (H) 0.3 - 0.9 mg/dL    Comment: Performed at [facility], [address] [place], [place] [address]  APTT     Status: Abnormal   Collection Time: 04/15/23  3:02 AM  Result Value Ref Range   aPTT 200 (HH) 24 - 36 seconds    Comment:        IF BASELINE aPTT IS ELEVATED, SUGGEST PATIENT RISK ASSESSMENT BE USED TO DETERMINE APPROPRIATE ANTICOAGULANT THERAPY. CRITICAL RESULT CALLED TO, READ BACK BY AND VERIFIED WITH: Winferd Humphrey RN @ 0430 04/15/23. GILBERTL Performed at [facility], [address] [place], [place] [address]   Protime-INR     Status: Abnormal   Collection Time: 04/15/23  3:02 AM  Result Value Ref Range   Prothrombin Time 18.3 (H) 11.4 - 15.2 seconds   INR 1.5 (H) 0.8 - 1.2    Comment: (NOTE) INR goal varies based on device and disease states. Performed at [facility], [address] [place], [place] [address]   Basic metabolic panel     Status: Abnormal   Collection Time: 04/15/23  3:02 AM  Result Value Ref Range   Sodium  138 135 - 145 mmol/L   Potassium 3.5 3.5 - 5.1 mmol/L   Chloride 106 98 - 111 mmol/L   CO2 23 22 - 32 mmol/L   Glucose, Bld 82 70 - 99 mg/dL    Comment: Glucose reference range applies only to samples taken after fasting for at least 8 hours.   BUN 20 8 - 23 mg/dL   Creatinine, Ser 5.36 0.61 - 1.24 mg/dL   Calcium 8.1 (L) 8.9 - 10.3 mg/dL   GFR, Estimated >64 >40 mL/min    Comment: (NOTE) Calculated using the CKD-EPI Creatinine Equation (2021)    Anion gap 9 5 - 15    Comment: Performed at [facility], [address], [place], [place] [address]  CBG monitoring, ED     Status: None   Collection Time: 04/15/23  7:47 AM  Result Value Ref Range   Glucose-Capillary 81 70 - 99 mg/dL    Comment: Glucose reference range applies only to samples taken after fasting for at least 8 hours.    US Abdomen Limited RUQ (LIVER/GB)  Result Date: 04/15/2023 CLINICAL DATA:  Elevated LFTs EXAM: ULTRASOUND ABDOMEN LIMITED RIGHT UPPER QUADRANT COMPARISON:  Chest CT today FINDINGS: Gallbladder: Stones and sludge seen within the gallbladder.  No wall thickening. The patient was tender over the gallbladder during the study. Common bile duct: Diameter: Normal caliber, 3 mm Liver: Increased echotexture compatible with fatty infiltration. No focal abnormality or biliary ductal dilatation. Portal vein is patent on color Doppler imaging with normal direction of blood flow towards the liver. Other: None. IMPRESSION: Gallstones and sludge within the gallbladder. The patient was tender over the gallbladder during the study. Recommend clinical correlation for possible cholecystitis. Fatty liver. Electronically Signed   By: Charlett Nose M.D.   On: 04/15/2023 00:20   CT Soft Tissue Neck W Contrast  Result Date: 04/14/2023 CLINICAL DATA:  Initial evaluation for acute difficulty swallowing over past few days. EXAM: CT NECK WITH CONTRAST TECHNIQUE: Multidetector CT imaging of the neck was performed using the  standard protocol following the bolus administration of intravenous contrast. RADIATION DOSE REDUCTION: This exam was performed according to the departmental dose-optimization program which includes automated exposure control, adjustment of the mA and/or kV according to patient size and/or use of iterative reconstruction technique. CONTRAST:  OMNIPAQUE IOHEXOL 300 MG/ML  SOLN COMPARISON:  None Available. FINDINGS: Pharynx and larynx: Oral cavity within normal limits. Oropharynx and nasopharynx within normal limits. No retropharyngeal collection or swelling. Epiglottis within normal limits. Hypopharynx and supraglottic larynx normal. Glottis symmetric and normal. Subglottic airway patent clear. Salivary glands: Salivary glands including the parotid and submandibular glands are within normal limits. Thyroid: Normal. Lymph nodes: No enlarged or pathologic adenopathy. Vascular: Normal intravascular enhancement seen within the neck. Mild atheromatous change about the carotid bifurcations. Limited intracranial: Unremarkable. Visualized orbits: Unremarkable. Mastoids and visualized paranasal sinuses: Clear/normal. Skeleton: 6 mm sclerotic lesion within the T2 vertebral body noted, nonspecific. No other worrisome osseous lesions. 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 Upper chest: No acute finding. Other: None. IMPRESSION: 1. Negative CT of the neck. No acute inflammatory changes or other abnormality identified. 2. 6 mm sclerotic lesion within the T2 vertebral body, favored benign, but incompletely assessed on this exam. Further evaluation with dedicated MRI, with and without contrast, recommended for complete evaluation. Electronically Signed   By: Rise Mu M.D.   On: 04/14/2023 17:50   CT Chest W Contrast  Result Date: 04/14/2023 CLINICAL DATA:  Dysphagia, difficulty swallowing even liquids over the last few days, nausea, vomiting EXAM:  CT CHEST WITH CONTRAST TECHNIQUE: Multidetector CT imaging of the chest was performed during intravenous contrast administration. RADIATION DOSE REDUCTION: This exam was performed according to the departmental dose-optimization program which includes automated exposure control, adjustment of the mA and/or kV according to patient size and/or use of iterative reconstruction technique. CONTRAST:  OMNIPAQUE IOHEXOL 300 MG/ML  SOLN COMPARISON:  Chest radiograph earlier today FINDINGS: Cardiovascular: Acute pulmonary emboli at the bifurcation of the right mainstem bronchus extending into the right upper lobe and right lower lobe pulmonary arteries. No evidence of right heart strain. No pericardial effusion. Aortic atherosclerotic calcification. Mediastinum/Nodes: Wall thickening of the lower esophagus with layering debris in the esophagus upstream from the area of thickening. Trace fluid about the lower esophagus. Trachea is unremarkable. No lymphadenopathy. Lungs/Pleura: No focal consolidation, pleural effusion, or pneumothorax. 4 mm ground-glass nodule in the left lower lobe, likely infectious/inflammatory. Per Fleischner Society guidelines, no routine follow-up imaging is recommended. These guidelines do not apply to immunocompromised patients and patients with cancer. Follow up in patients with significant comorbidities as clinically warranted. For lung cancer screening, adhere to Lung-RADS guidelines. Reference: Radiology. 2017; 284(1):228-43.  Upper Abdomen: No acute abnormality. Musculoskeletal: No acute fracture. IMPRESSION: 1. Acute pulmonary embolism in the right main pulmonary artery extending into the upper and lower lobes. No evidence of right heart strain. 2. Wall thickening of the lower esophagus with layering debris upstream from the area of thickening. Trace fluid about the lower esophagus. This may be due to esophagitis however correlation with endoscopy is recommended to exclude malignancy. Aortic  Atherosclerosis (ICD10-I70.0). Critical Value/emergent results were called by telephone at the time of interpretation on 04/14/2023 at 5:32 pm to provider Sabra Heck , who verbally acknowledged these results. Electronically Signed   By: Minerva Fester M.D.   On: 04/14/2023 17:32   DG Chest Port 1 View  Result Date: 04/14/2023 CLINICAL DATA:  Chest discomfort EXAM: PORTABLE CHEST 1 VIEW COMPARISON:  None Available. FINDINGS: The heart size and mediastinal contours are within normal limits. Bibasilar scarring/atelectasis. The lungs are otherwise clear. Advanced arthritis left shoulder. Aortic atherosclerotic calcification. IMPRESSION: No active disease. Electronically Signed   By: Minerva Fester M.D.   On: 04/14/2023 15:57    Review of Systems negative except above Blood pressure (!) 153/96, pulse 72, temperature 98.6 F (37 C), temperature source Oral, resp. rate 18, height 5\' 6"  (1.676 m), weight 92.5 kg, SpO2 96%. 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

## 2023-04-15 NOTE — ED Notes (Signed)
Patient was given orange juice    for BGS 71

## 2023-04-15 NOTE — Progress Notes (Signed)
PHARMACY - ANTICOAGULATION CONSULT NOTE  Pharmacy Consult for heparin Indication: acute pulmonary embolus  Allergies  Allergen Reactions   Penicillins     Shortness of breath   Sulfa Antibiotics     Blisters    Procaine Other (See Comments)    Caused fatigue    Patient Measurements: Height: 5\' 6"  (167.6 cm) Weight: 92.5 kg (203 lb 14.8 oz) IBW/kg (Calculated) : 63.8 Heparin Dosing Weight: 84 kg  Vital Signs: Temp: 98.6 F (37 C) (10/12 1116) Temp Source: Oral (10/12 1116) BP: 128/89 (10/12 1200) Pulse Rate: 84 (10/12 1200)  Labs: Recent Labs    04/14/23 1416 04/14/23 1752 04/15/23 0300 04/15/23 0302 04/15/23 1247  HGB 18.2*  --   --  15.2  --   HCT 53.8*  --   --  45.5  --   PLT 211  --   --  164  --   APTT  --   --   --  200*  --   LABPROT  --   --   --  18.3*  --   INR  --   --   --  1.5*  --   HEPARINUNFRC  --   --  1.04*  --  0.34  CREATININE 0.93  --   --  0.77  --   TROPONINIHS 56* 67*  --   --   --     Estimated Creatinine Clearance: 82.4 mL/min (by C-G formula based on SCr of 0.77 mg/dL).   Medical History: Past Medical History:  Diagnosis Date   Diabetes mellitus without complication [facility])     Assessment: Patient is a 77 y.o M who presented to the ED on 04/14/23 with c/o CP and difficulty swallowing. Chest CT showed acute PE in the right main pulmonary artery extending into the upper and lower lobes with no evidence of RHS. Pharmacy has been consulted to dose heparin for VTE treatment. He is not on anticoagulation PTA.  Baseline labs WNL.  Today, 04/15/2023: Heparin level is now therapeutic at 0.34 CBC WNL No bleeding or infusion related concerns reported by RN  Goal of Therapy:  Heparin level 0.3-0.7 units/ml Monitor platelets by anticoagulation protocol: Yes   Plan:  Continue heparin at 1050 units/hr Check confirmatory heparin level in 8 hrs Monitor for s/sx bleeding Daily heparin level & CBC while on heparin F/U plans for long-term  anticoagulation   Cherylin Mylar, PharmD Clinical Pharmacist  10/12/20241:54 PM

## 2023-04-15 NOTE — Progress Notes (Signed)
BLE venous duplex has been completed.   Results can be found under chart review under CV PROC. 04/15/2023 10:24 AM Braylei Totino RVT, RDMS

## 2023-04-15 NOTE — ED Notes (Signed)
Pt placed on 1L O2 for comfort/ per pt request

## 2023-04-16 DIAGNOSIS — I2699 Other pulmonary embolism without acute cor pulmonale: Secondary | ICD-10-CM | POA: Diagnosis not present

## 2023-04-16 DIAGNOSIS — R001 Bradycardia, unspecified: Secondary | ICD-10-CM | POA: Diagnosis not present

## 2023-04-16 DIAGNOSIS — I441 Atrioventricular block, second degree: Secondary | ICD-10-CM | POA: Diagnosis not present

## 2023-04-16 LAB — BASIC METABOLIC PANEL
Anion gap: 10 (ref 5–15)
BUN: 13 mg/dL (ref 8–23)
CO2: 23 mmol/L (ref 22–32)
Calcium: 8.5 mg/dL — ABNORMAL LOW (ref 8.9–10.3)
Chloride: 107 mmol/L (ref 98–111)
Creatinine, Ser: 0.83 mg/dL (ref 0.61–1.24)
GFR, Estimated: >60 mL/min (ref >60)
Glucose, Bld: 82 mg/dL (ref 70–99)
Potassium: 3.6 mmol/L (ref 3.5–5.1)
Sodium: 140 mmol/L (ref 135–145)

## 2023-04-16 LAB — COMPREHENSIVE METABOLIC PANEL
ALT: 58 U/L — ABNORMAL HIGH (ref 0–44)
AST: 50 U/L — ABNORMAL HIGH (ref 15–41)
Albumin: 2.5 g/dL — ABNORMAL LOW (ref 3.5–5.0)
Alkaline Phosphatase: 42 U/L (ref 38–126)
Anion gap: 10 (ref 5–15)
BUN: 13 mg/dL (ref 8–23)
CO2: 23 mmol/L (ref 22–32)
Calcium: 8.3 mg/dL — ABNORMAL LOW (ref 8.9–10.3)
Chloride: 106 mmol/L (ref 98–111)
Creatinine, Ser: 0.89 mg/dL (ref 0.61–1.24)
GFR, Estimated: >60 mL/min (ref >60)
Glucose, Bld: 78 mg/dL (ref 70–99)
Potassium: 3.6 mmol/L (ref 3.5–5.1)
Sodium: 139 mmol/L (ref 135–145)
Total Bilirubin: 1.5 mg/dL — ABNORMAL HIGH (ref 0.3–1.2)
Total Protein: 4.7 g/dL — ABNORMAL LOW (ref 6.5–8.1)

## 2023-04-16 LAB — CBC
HCT: 45.6 % (ref 39.0–52.0)
Hemoglobin: 15.3 g/dL (ref 13.0–17.0)
MCH: 29.4 pg (ref 26.0–34.0)
MCHC: 33.6 g/dL (ref 30.0–36.0)
MCV: 87.5 fL (ref 80.0–100.0)
Platelets: 153 10*3/uL (ref 150–400)
RBC: 5.21 MIL/uL (ref 4.22–5.81)
RDW: 13.8 % (ref 11.5–15.5)
WBC: 9.9 10*3/uL (ref 4.0–10.5)
nRBC: 0 % (ref 0.0–0.2)

## 2023-04-16 LAB — HEPARIN LEVEL (UNFRACTIONATED)
Heparin Unfractionated: 0.87 [IU]/mL — ABNORMAL HIGH (ref 0.30–0.70)
Heparin Unfractionated: 0.71 [IU]/mL — ABNORMAL HIGH (ref 0.30–0.70)
Heparin Unfractionated: 0.65 [IU]/mL (ref 0.30–0.70)

## 2023-04-16 LAB — MAGNESIUM: Magnesium: 2.2 mg/dL (ref 1.7–2.4)

## 2023-04-16 LAB — GLUCOSE, CAPILLARY
Glucose-Capillary: 79 mg/dL (ref 70–99)
Glucose-Capillary: 72 mg/dL (ref 70–99)
Glucose-Capillary: 75 mg/dL (ref 70–99)
Glucose-Capillary: 79 mg/dL (ref 70–99)

## 2023-04-16 LAB — TROPONIN I (HIGH SENSITIVITY): Troponin I (High Sensitivity): 67 ng/L — ABNORMAL HIGH (ref <18)

## 2023-04-16 MED ORDER — PIPERACILLIN-TAZOBACTAM 3.375 G IVPB: 3.3750 g | Freq: Three times a day (TID) | INTRAVENOUS | Status: DC

## 2023-04-16 MED ORDER — ATROPINE SULFATE 1 MG/10ML IJ SOSY: 1.0000 mg | PREFILLED_SYRINGE | INTRAMUSCULAR | Status: DC | PRN

## 2023-04-16 MED ORDER — ATROPINE SULFATE 1 MG/10ML IJ SOSY
1.0000 mg | PREFILLED_SYRINGE | INTRAMUSCULAR | Status: AC | PRN
  Administered 2023-04-16: 1 mg via INTRAVENOUS
  Administered 2023-04-18 (×2): .2 mg via INTRAVENOUS
  Filled 2023-04-16 (×2): qty 10

## 2023-04-16 MED ORDER — ATROPINE SULFATE 1 MG/10ML IJ SOSY: 1.0000 mg | PREFILLED_SYRINGE | Freq: Once | INTRAMUSCULAR | Status: DC

## 2023-04-16 MED ORDER — SODIUM CHLORIDE 0.9 % IV SOLN: 2.0000 g | INTRAVENOUS | Status: DC

## 2023-04-16 MED ORDER — ATROPINE SULFATE 1 MG/10ML IJ SOSY
1.0000 mg | PREFILLED_SYRINGE | INTRAMUSCULAR | Status: AC
  Administered 2023-04-16: 1 mg via INTRAVENOUS
  Filled 2023-04-16: qty 10

## 2023-04-16 MED ORDER — SODIUM CHLORIDE 0.45 % IV SOLN: INTRAVENOUS | Status: AC

## 2023-04-16 MED ORDER — PIPERACILLIN-TAZOBACTAM 3.375 G IVPB
3.3750 g | Freq: Three times a day (TID) | INTRAVENOUS | Status: DC
  Administered 2023-04-16 – 2023-04-18 (×6): 3.375 g via INTRAVENOUS
  Filled 2023-04-16 (×6): qty 50

## 2023-04-16 NOTE — Progress Notes (Addendum)
HOSPITALIST ROUNDING NOTE Horton Finer XBM:[id number]  DOB: Nov 30, 1945  DOA: 04/14/2023  PCP: [name], MD  04/16/2023,7:50 AM   LOS: 2 days      Code Status: Full From: Home  current Dispo: Unclear    77 year old white male Prediabetes 6.2 Bilateral knee arthroscopies  Insidious onset dysphagia solids >liquids X 2 weeks  Associated with chest pain worse with deep inspiration at the time with anorexia CT scan right-sided PE started on heparin gtt. 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  Positive Murphy sign--CT neck = 6 mm sclerotic T2 vertebral body lesion recommending MRI 10/12 GI was consulted-We are planning for barium swallow  Echocardiogram LVEF 55% grade 1 DD right heart strain on echo   EKG = second-degree AV block heart rate 30s RBBB-patient transferred to [facility] for EP evaluation?  Pacemaker placement  10/13 early a.m. patient found to be bradycardic into the 30s given atropine X1 troponins flat in the 60 range Continues to await further cardiology plan    Plan  Symptomatic second-degree AV block given atropine several times overnight 10/13 for heart rates in the 30s At bedside heart rate is about 50 but dips into the 30s with pauses about 1 and half to 2 seconds He has pacer pads on does not need atropine at this time but does feel occasionally dizzy has not passed out has no chest pain that seems cardiogenic (see below) Keep on saline 0.4 540 cc/H for now Hopeful to avoid emergent pacing-appreciate cardiology input in advance-they are aware of patient  Acute PE with right heart strain First onset-heparin GTT for now-do not convert until all procedures are considered/completed  Dysphagia additionally possible esophageal CA? Barium swallow has been ordered by GI There is some  concern of malignancy-scope dependent on barium swallow-appreciate GI input  Hyperthyroidism suspected TSH 0.2 T41.2 thyroid does not appear enlarged-CT argues against any mass would not further workup hyperthyroidism at this time instead once he is out of physiological disequilibrium will get TSH and T4 again in the outpatient setting and can follow-up with PCP for this  Prediabetes A1c 6.2 Metformin allegedly causes diarrhea-outpatient follow-up with PCP-sugars are well below 100 so I am not even sure if he needs [place] going forward  \Transaminitis slight hyperbilirubinemia- ?  Gallbladder stones with positive Murphy sign-leukocytosis on admission He is nontender in the abdomen although he has positive Eulah Pont sign He may need lap chole and if he is symptomatic with pain will probably need cholecystotomy under IR-this is not emergent I will start empiric Rocephin at this time which can be narrowed in the next several days    DVT prophylaxis: Heparin GTT  Status is: Inpatient Remains inpatient appropriate because:   Requires further workup   Subjective: Awake coherent some discomfort in his throat No pain running from his neck into his arm or cardiac leg pain Not swallowing well-feels dizzy occasionally still-at bedside he looks comfortable and coherent  Objective + exam Vitals:   04/15/23 1130 04/15/23 1200 04/15/23 1935 04/15/23 2334  BP: 131/72 128/89 (!) 153/62 (!) 111/49  Pulse: 72 84 69 (!) 51  Resp: 20 14 15 20   Temp:   98.2 F (36.8 C) 98.2 F (36.8 C)  TempSrc:   Oral Oral  SpO2: 96% 98% 97% 98%  Weight:      Height:  Filed Weights   04/14/23 1247  Weight: 92.5 kg    Examination:  Well-appearing white male younger than stated age no icterus no pallor S1-S2 sinus bradycardia-pauses at times-pacer pads are on Abdomen is soft right upper quadrant nontender No lower extremity edema Neuro is intact moving 4 limbs equally   Data Reviewed: reviewed    CBC    Component Value Date/Time   WBC 9.9 04/16/2023 0605   RBC 5.21 04/16/2023 0605   HGB 15.3 04/16/2023 0605   HGB 15.4 08/09/2022 1425   HCT 45.6 04/16/2023 0605   HCT 46.0 08/09/2022 1425   PLT 153 04/16/2023 0605   PLT 281 08/09/2022 1425   MCV 87.5 04/16/2023 0605   MCV 87 08/09/2022 1425   MCH 29.4 04/16/2023 0605   MCHC 33.6 04/16/2023 0605   RDW 13.8 04/16/2023 0605   RDW 12.8 08/09/2022 1425   LYMPHSABS 2.3 04/14/2023 1416   LYMPHSABS 2.8 08/09/2022 1425   MONOABS 1.3 (H) 04/14/2023 1416   EOSABS 0.0 04/14/2023 1416   EOSABS 0.4 08/09/2022 1425   BASOSABS 0.0 04/14/2023 1416   BASOSABS 0.1 08/09/2022 1425      Latest Ref Rng & Units 04/16/2023    6:05 AM 04/16/2023    1:01 AM 04/15/2023    3:02 AM  CMP  Glucose 70 - 99 mg/dL 78  82  82   BUN 8 - 23 mg/dL 13  13  20    Creatinine 0.61 - 1.24 mg/dL 7.56  4.33  2.95   Sodium 135 - 145 mmol/L 139  140  138   Potassium 3.5 - 5.1 mmol/L 3.6  3.6  3.5   Chloride 98 - 111 mmol/L 106  107  106   CO2 22 - 32 mmol/L 23  23  23    Calcium 8.9 - 10.3 mg/dL 8.3  8.5  8.1   Total Protein 6.5 - 8.1 g/dL 4.7   5.2   Total Bilirubin 0.3 - 1.2 mg/dL 1.5   1.9   Alkaline Phos 38 - 126 U/L 42   48   AST 15 - 41 U/L 50   55   ALT 0 - 44 U/L 58   66      Scheduled Meds:  influenza vaccine adjuvanted  0.5 mL Intramuscular Tomorrow-1000   insulin aspart  0-15 Units Subcutaneous TID WC   pantoprazole (PROTONIX) IV  40 mg Intravenous QHS   Continuous Infusions:  sodium chloride 40 mL/hr at 04/15/23 1034   heparin 800 Units/hr (04/16/23 0658)    Time 70 minutes  Rhetta Mura, MD  [facility]

## 2023-04-16 NOTE — Progress Notes (Signed)
PHARMACY - ANTICOAGULATION CONSULT NOTE  Pharmacy Consult for heparin Indication: pulmonary embolus  Labs: Recent Labs    04/14/23 1416 04/14/23 1752 04/15/23 0300 04/15/23 0302 04/15/23 1247 04/15/23 2224 04/16/23 0101 04/16/23 0605  HGB 18.2*  --   --  15.2  --   --   --  15.3  HCT 53.8*  --   --  45.5  --   --   --  45.6  PLT 211  --   --  164  --   --   --  153  APTT  --   --   --  200*  --   --   --   --   LABPROT  --   --   --  18.3*  --   --   --   --   INR  --   --   --  1.5*  --   --   --   --   HEPARINUNFRC  --   --    < >  --  0.34 0.82*  --  0.87*  CREATININE 0.93  --   --  0.77  --   --  0.83 0.89  TROPONINIHS 56* 67*  --   --   --   --  67*  --    < > = values in this interval not displayed.    Assessment: 77yo male remains supratherapeutic on heparin after rate change; no infusion issues or signs of bleeding per RN.  Goal of Therapy:  Heparin level 0.3-0.7 units/ml   Plan:  Decrease heparin infusion by 1-2 unit/kg/hr to 800 units/hr. Check level in 6-8 hours.   Vernard Gambles, PharmD, BCPS 04/16/2023 6:57 AM

## 2023-04-16 NOTE — Progress Notes (Signed)
PHARMACY - ANTICOAGULATION CONSULT NOTE  Pharmacy Consult for heparin Indication: pulmonary embolus  Labs: Recent Labs    04/14/23 1416 04/14/23 1752 04/15/23 0300 04/15/23 0302 04/15/23 1247 04/16/23 0101 04/16/23 0605 04/16/23 1314 04/16/23 2306  HGB 18.2*  --   --  15.2  --   --  15.3  --   --   HCT 53.8*  --   --  45.5  --   --  45.6  --   --   PLT 211  --   --  164  --   --  153  --   --   APTT  --   --   --  200*  --   --   --   --   --   LABPROT  --   --   --  18.3*  --   --   --   --   --   INR  --   --   --  1.5*  --   --   --   --   --   HEPARINUNFRC  --   --    < >  --    < >  --  0.87* 0.71* 0.65  CREATININE 0.93  --   --  0.77  --  0.83 0.89  --   --   TROPONINIHS 56* 67*  --   --   --  67*  --   --   --    < > = values in this interval not displayed.    Assessment/Plan:  77yo male therapeutic on heparin after rate change. Will continue infusion at current rate of 750 units/hr and confirm stable with am labs.  Vernard Gambles, PharmD, BCPS 04/16/2023 11:50 PM

## 2023-04-16 NOTE — Progress Notes (Signed)
Patient's HR drop to 29 while sleeping, when awake patient's HR was in the 30s.MD notified and new orders received.. On Call MD is at the bedside.Marland Kitchen

## 2023-04-16 NOTE — Progress Notes (Signed)
PHARMACY - ANTICOAGULATION CONSULT NOTE  Pharmacy Consult for heparin Indication: acute pulmonary embolus  Allergies  Allergen Reactions   Penicillins     Shortness of breath   Sulfa Antibiotics     Blisters    Procaine Other (See Comments)    Caused fatigue    Patient Measurements: Height: 5\' 6"  (167.6 cm) Weight: 92.5 kg (203 lb 14.8 oz) IBW/kg (Calculated) : 63.8 Heparin Dosing Weight: 84 kg  Vital Signs: Temp: 98 F (36.7 C) (10/13 1119) Temp Source: Oral (10/13 1119)  Labs: Recent Labs    04/14/23 1416 04/14/23 1752 04/15/23 0300 04/15/23 0302 04/15/23 1247 04/15/23 2224 04/16/23 0101 04/16/23 0605 04/16/23 1314  HGB 18.2*  --   --  15.2  --   --   --  15.3  --   HCT 53.8*  --   --  45.5  --   --   --  45.6  --   PLT 211  --   --  164  --   --   --  153  --   APTT  --   --   --  200*  --   --   --   --   --   LABPROT  --   --   --  18.3*  --   --   --   --   --   INR  --   --   --  1.5*  --   --   --   --   --   HEPARINUNFRC  --   --    < >  --    < > 0.82*  --  0.87* 0.71*  CREATININE 0.93  --   --  0.77  --   --  0.83 0.89  --   TROPONINIHS 56* 67*  --   --   --   --  67*  --   --    < > = values in this interval not displayed.    Estimated Creatinine Clearance: 74 mL/min (by C-G formula based on SCr of 0.89 mg/dL).   Medical History: Past Medical History:  Diagnosis Date   Diabetes mellitus without complication [facility])     Assessment: Patient is a 77 y.o M who presented to the ED on 04/14/23 with c/o CP and difficulty swallowing. Chest CT showed acute PE in the right main pulmonary artery extending into the upper and lower lobes with no evidence of RHS. Pharmacy has been consulted to dose heparin for VTE treatment. He is not on anticoagulation PTA.  Baseline labs WNL.  Today, 04/16/2023: Heparin level is 0.71, still slightly supratherapeutic with heparin infusion at 800 units/hour CBC WNL No bleeding or infusion related concerns reported by  RN  Goal of Therapy:  Heparin level 0.3-0.7 units/ml Monitor platelets by anticoagulation protocol: Yes   Plan:  Decrease heparin infusion to 750 units/hr Check heparin level in 8 hours Monitor for signs/symptoms of bleeding Daily heparin level & CBC while on heparin F/U plans for long-term anticoagulation   Stephenie Acres, PharmD PGY1 Pharmacy Resident 04/16/2023 1:58 PM

## 2023-04-16 NOTE — Progress Notes (Signed)
60  ANIONGAP 15 9  --  10 10    Lipids No results for input(s): "CHOL", "TRIG", "HDL", "LABVLDL", "LDLCALC", "CHOLHDL" in the last 168 hours.  Hematology Recent Labs  Lab 04/14/23 1416 04/15/23 0302 04/16/23 0605  WBC 13.2* 11.0* 9.9  RBC 6.19* 5.12 5.21  HGB 18.2* 15.2 15.3  HCT 53.8* 45.5 45.6  MCV 86.9 88.9 87.5  MCH 29.4 29.7 29.4  MCHC 33.8 33.4 33.6  RDW 13.9 13.8 13.8  PLT 211 164 153   Thyroid  Recent Labs  Lab 04/15/23 1750  TSH 0.242*  FREET4 1.28*    BNPNo results for input(s): "BNP", "PROBNP" in the last 168 hours.  DDimer No results for input(s): "DDIMER" in the last 168 hours.   Radiology    ECHOCARDIOGRAM COMPLETE  Result Date: 04/15/2023    ECHOCARDIOGRAM REPORT   Patient Name:   Samuel [name] Date of Exam: 04/15/2023 Medical Rec #:  [id number]   Height:       66.0 in Accession #:    [id number]  Weight:       203.9 lb Date of Birth:  [birth year]   BSA:          2.016 m Patient Age:    77 years    BP:           127/67 mmHg Patient Gender: M           HR:           61 bpm. Exam Location:  Inpatient Procedure: 2D Echo, Cardiac Doppler and Color Doppler Indications:    Pulmonary embolus  History:        Patient has no prior history of Echocardiogram examinations.                  Arrythmias:Bradycardia; Risk Factors:Diabetes and Former Smoker.  Sonographer:    Milda Smart Referring Phys: [id number] [facility] GOEL  Sonographer Comments: Image acquisition challenging due to respiratory motion. IMPRESSIONS  1. Left ventricular ejection fraction, by estimation, is 55%. The left ventricle has normal function. The left ventricle has no regional wall motion abnormalities. Left ventricular diastolic parameters are consistent with Grade I diastolic dysfunction (impaired relaxation).  2. Right ventricular systolic function is normal. The right ventricular size is normal. Tricuspid regurgitation signal is inadequate for assessing PA pressure.  3. The mitral valve is normal in structure. Trivial mitral valve regurgitation. No evidence of mitral stenosis.  4. The aortic valve is grossly normal. Aortic valve regurgitation is mild to moderate. Aortic valve sclerosis is present, with no evidence of aortic valve stenosis.  5. The inferior vena cava is normal in size with greater than 50% respiratory variability, suggesting right atrial pressure of 3 mmHg. FINDINGS  Left Ventricle: Left ventricular ejection fraction, by estimation, is 55%. The left ventricle has normal function. The left ventricle has no regional wall motion abnormalities. The left ventricular internal cavity size was normal in size. There is no left ventricular hypertrophy. Left ventricular diastolic parameters are consistent with Grade I diastolic dysfunction (impaired relaxation). Right Ventricle: The right ventricular size is normal. No increase in right ventricular wall thickness. Right ventricular systolic function is normal. Tricuspid regurgitation signal is inadequate for assessing PA pressure. The tricuspid regurgitant velocity is 1.34 m/s, and with an assumed right atrial pressure of 3 mmHg, the estimated right ventricular systolic pressure is 10.2 mmHg. Left Atrium: Left atrial size was normal in size. Right Atrium: Right atrial  size was normal in size. Pericardium: There  60  ANIONGAP 15 9  --  10 10    Lipids No results for input(s): "CHOL", "TRIG", "HDL", "LABVLDL", "LDLCALC", "CHOLHDL" in the last 168 hours.  Hematology Recent Labs  Lab 04/14/23 1416 04/15/23 0302 04/16/23 0605  WBC 13.2* 11.0* 9.9  RBC 6.19* 5.12 5.21  HGB 18.2* 15.2 15.3  HCT 53.8* 45.5 45.6  MCV 86.9 88.9 87.5  MCH 29.4 29.7 29.4  MCHC 33.8 33.4 33.6  RDW 13.9 13.8 13.8  PLT 211 164 153   Thyroid  Recent Labs  Lab 04/15/23 1750  TSH 0.242*  FREET4 1.28*    BNPNo results for input(s): "BNP", "PROBNP" in the last 168 hours.  DDimer No results for input(s): "DDIMER" in the last 168 hours.   Radiology    ECHOCARDIOGRAM COMPLETE  Result Date: 04/15/2023    ECHOCARDIOGRAM REPORT   Patient Name:   Samuel [name] Date of Exam: 04/15/2023 Medical Rec #:  [id number]   Height:       66.0 in Accession #:    [id number]  Weight:       203.9 lb Date of Birth:  [birth year]   BSA:          2.016 m Patient Age:    77 years    BP:           127/67 mmHg Patient Gender: M           HR:           61 bpm. Exam Location:  Inpatient Procedure: 2D Echo, Cardiac Doppler and Color Doppler Indications:    Pulmonary embolus  History:        Patient has no prior history of Echocardiogram examinations.                  Arrythmias:Bradycardia; Risk Factors:Diabetes and Former Smoker.  Sonographer:    Milda Smart Referring Phys: [id number] [facility] GOEL  Sonographer Comments: Image acquisition challenging due to respiratory motion. IMPRESSIONS  1. Left ventricular ejection fraction, by estimation, is 55%. The left ventricle has normal function. The left ventricle has no regional wall motion abnormalities. Left ventricular diastolic parameters are consistent with Grade I diastolic dysfunction (impaired relaxation).  2. Right ventricular systolic function is normal. The right ventricular size is normal. Tricuspid regurgitation signal is inadequate for assessing PA pressure.  3. The mitral valve is normal in structure. Trivial mitral valve regurgitation. No evidence of mitral stenosis.  4. The aortic valve is grossly normal. Aortic valve regurgitation is mild to moderate. Aortic valve sclerosis is present, with no evidence of aortic valve stenosis.  5. The inferior vena cava is normal in size with greater than 50% respiratory variability, suggesting right atrial pressure of 3 mmHg. FINDINGS  Left Ventricle: Left ventricular ejection fraction, by estimation, is 55%. The left ventricle has normal function. The left ventricle has no regional wall motion abnormalities. The left ventricular internal cavity size was normal in size. There is no left ventricular hypertrophy. Left ventricular diastolic parameters are consistent with Grade I diastolic dysfunction (impaired relaxation). Right Ventricle: The right ventricular size is normal. No increase in right ventricular wall thickness. Right ventricular systolic function is normal. Tricuspid regurgitation signal is inadequate for assessing PA pressure. The tricuspid regurgitant velocity is 1.34 m/s, and with an assumed right atrial pressure of 3 mmHg, the estimated right ventricular systolic pressure is 10.2 mmHg. Left Atrium: Left atrial size was normal in size. Right Atrium: Right atrial  size was normal in size. Pericardium: There  60  ANIONGAP 15 9  --  10 10    Lipids No results for input(s): "CHOL", "TRIG", "HDL", "LABVLDL", "LDLCALC", "CHOLHDL" in the last 168 hours.  Hematology Recent Labs  Lab 04/14/23 1416 04/15/23 0302 04/16/23 0605  WBC 13.2* 11.0* 9.9  RBC 6.19* 5.12 5.21  HGB 18.2* 15.2 15.3  HCT 53.8* 45.5 45.6  MCV 86.9 88.9 87.5  MCH 29.4 29.7 29.4  MCHC 33.8 33.4 33.6  RDW 13.9 13.8 13.8  PLT 211 164 153   Thyroid  Recent Labs  Lab 04/15/23 1750  TSH 0.242*  FREET4 1.28*    BNPNo results for input(s): "BNP", "PROBNP" in the last 168 hours.  DDimer No results for input(s): "DDIMER" in the last 168 hours.   Radiology    ECHOCARDIOGRAM COMPLETE  Result Date: 04/15/2023    ECHOCARDIOGRAM REPORT   Patient Name:   Samuel [name] Date of Exam: 04/15/2023 Medical Rec #:  [id number]   Height:       66.0 in Accession #:    [id number]  Weight:       203.9 lb Date of Birth:  [birth year]   BSA:          2.016 m Patient Age:    77 years    BP:           127/67 mmHg Patient Gender: M           HR:           61 bpm. Exam Location:  Inpatient Procedure: 2D Echo, Cardiac Doppler and Color Doppler Indications:    Pulmonary embolus  History:        Patient has no prior history of Echocardiogram examinations.                  Arrythmias:Bradycardia; Risk Factors:Diabetes and Former Smoker.  Sonographer:    Milda Smart Referring Phys: [id number] [facility] GOEL  Sonographer Comments: Image acquisition challenging due to respiratory motion. IMPRESSIONS  1. Left ventricular ejection fraction, by estimation, is 55%. The left ventricle has normal function. The left ventricle has no regional wall motion abnormalities. Left ventricular diastolic parameters are consistent with Grade I diastolic dysfunction (impaired relaxation).  2. Right ventricular systolic function is normal. The right ventricular size is normal. Tricuspid regurgitation signal is inadequate for assessing PA pressure.  3. The mitral valve is normal in structure. Trivial mitral valve regurgitation. No evidence of mitral stenosis.  4. The aortic valve is grossly normal. Aortic valve regurgitation is mild to moderate. Aortic valve sclerosis is present, with no evidence of aortic valve stenosis.  5. The inferior vena cava is normal in size with greater than 50% respiratory variability, suggesting right atrial pressure of 3 mmHg. FINDINGS  Left Ventricle: Left ventricular ejection fraction, by estimation, is 55%. The left ventricle has normal function. The left ventricle has no regional wall motion abnormalities. The left ventricular internal cavity size was normal in size. There is no left ventricular hypertrophy. Left ventricular diastolic parameters are consistent with Grade I diastolic dysfunction (impaired relaxation). Right Ventricle: The right ventricular size is normal. No increase in right ventricular wall thickness. Right ventricular systolic function is normal. Tricuspid regurgitation signal is inadequate for assessing PA pressure. The tricuspid regurgitant velocity is 1.34 m/s, and with an assumed right atrial pressure of 3 mmHg, the estimated right ventricular systolic pressure is 10.2 mmHg. Left Atrium: Left atrial size was normal in size. Right Atrium: Right atrial  size was normal in size. Pericardium: There  60  ANIONGAP 15 9  --  10 10    Lipids No results for input(s): "CHOL", "TRIG", "HDL", "LABVLDL", "LDLCALC", "CHOLHDL" in the last 168 hours.  Hematology Recent Labs  Lab 04/14/23 1416 04/15/23 0302 04/16/23 0605  WBC 13.2* 11.0* 9.9  RBC 6.19* 5.12 5.21  HGB 18.2* 15.2 15.3  HCT 53.8* 45.5 45.6  MCV 86.9 88.9 87.5  MCH 29.4 29.7 29.4  MCHC 33.8 33.4 33.6  RDW 13.9 13.8 13.8  PLT 211 164 153   Thyroid  Recent Labs  Lab 04/15/23 1750  TSH 0.242*  FREET4 1.28*    BNPNo results for input(s): "BNP", "PROBNP" in the last 168 hours.  DDimer No results for input(s): "DDIMER" in the last 168 hours.   Radiology    ECHOCARDIOGRAM COMPLETE  Result Date: 04/15/2023    ECHOCARDIOGRAM REPORT   Patient Name:   Samuel [name] Date of Exam: 04/15/2023 Medical Rec #:  [id number]   Height:       66.0 in Accession #:    [id number]  Weight:       203.9 lb Date of Birth:  [birth year]   BSA:          2.016 m Patient Age:    77 years    BP:           127/67 mmHg Patient Gender: M           HR:           61 bpm. Exam Location:  Inpatient Procedure: 2D Echo, Cardiac Doppler and Color Doppler Indications:    Pulmonary embolus  History:        Patient has no prior history of Echocardiogram examinations.                  Arrythmias:Bradycardia; Risk Factors:Diabetes and Former Smoker.  Sonographer:    Milda Smart Referring Phys: [id number] [facility] GOEL  Sonographer Comments: Image acquisition challenging due to respiratory motion. IMPRESSIONS  1. Left ventricular ejection fraction, by estimation, is 55%. The left ventricle has normal function. The left ventricle has no regional wall motion abnormalities. Left ventricular diastolic parameters are consistent with Grade I diastolic dysfunction (impaired relaxation).  2. Right ventricular systolic function is normal. The right ventricular size is normal. Tricuspid regurgitation signal is inadequate for assessing PA pressure.  3. The mitral valve is normal in structure. Trivial mitral valve regurgitation. No evidence of mitral stenosis.  4. The aortic valve is grossly normal. Aortic valve regurgitation is mild to moderate. Aortic valve sclerosis is present, with no evidence of aortic valve stenosis.  5. The inferior vena cava is normal in size with greater than 50% respiratory variability, suggesting right atrial pressure of 3 mmHg. FINDINGS  Left Ventricle: Left ventricular ejection fraction, by estimation, is 55%. The left ventricle has normal function. The left ventricle has no regional wall motion abnormalities. The left ventricular internal cavity size was normal in size. There is no left ventricular hypertrophy. Left ventricular diastolic parameters are consistent with Grade I diastolic dysfunction (impaired relaxation). Right Ventricle: The right ventricular size is normal. No increase in right ventricular wall thickness. Right ventricular systolic function is normal. Tricuspid regurgitation signal is inadequate for assessing PA pressure. The tricuspid regurgitant velocity is 1.34 m/s, and with an assumed right atrial pressure of 3 mmHg, the estimated right ventricular systolic pressure is 10.2 mmHg. Left Atrium: Left atrial size was normal in size. Right Atrium: Right atrial  size was normal in size. Pericardium: There  Systemic VTI:  0.26 m                            Systemic Diam: 2.50 cm [name] MD Electronically signed by [name] MD Signature Date/Time: 04/15/2023/3:27:38 PM    Final    VAS [place] LOWER EXTREMITY VENOUS (DVT)  Result Date: 04/15/2023  Lower Venous DVT Study Patient Name:  ADARIUS TIGGES  Date of Exam:   04/15/2023 Medical Rec #: [id number]    Accession #:    [id number] Date of Birth: 06-25-1946    Patient Gender: M Patient Age:   66 years Exam Location:  [facility] Procedure:      VAS [place] LOWER EXTREMITY VENOUS (DVT) Referring Phys: [facility] GOEL --------------------------------------------------------------------------------  Indications: Pulmonary embolism.  Comparison Study: No previous exams Performing Technologist: Jody Hill RVT, RDMS  Examination Guidelines: A complete evaluation includes B-mode imaging, spectral Doppler, color Doppler, and power Doppler as needed of all accessible portions of each vessel. Bilateral testing is considered an integral part of a complete examination. Limited examinations for reoccurring indications may be performed as noted. The reflux portion of the exam is performed with the patient in reverse Trendelenburg.  +---------+---------------+---------+-----------+----------+--------------+ RIGHT    CompressibilityPhasicitySpontaneityPropertiesThrombus Aging +---------+---------------+---------+-----------+----------+--------------+ CFV      Full           Yes      Yes                                  +---------+---------------+---------+-----------+----------+--------------+ SFJ      Full                                                        +---------+---------------+---------+-----------+----------+--------------+ FV Prox  Full           Yes      Yes                                 +---------+---------------+---------+-----------+----------+--------------+ FV Mid   Full           Yes      Yes                                 +---------+---------------+---------+-----------+----------+--------------+ FV DistalFull           Yes      Yes                                 +---------+---------------+---------+-----------+----------+--------------+ PFV      Full                                                        +---------+---------------+---------+-----------+----------+--------------+ POP      Full           Yes      Yes                                 +---------+---------------+---------+-----------+----------+--------------+  60  ANIONGAP 15 9  --  10 10    Lipids No results for input(s): "CHOL", "TRIG", "HDL", "LABVLDL", "LDLCALC", "CHOLHDL" in the last 168 hours.  Hematology Recent Labs  Lab 04/14/23 1416 04/15/23 0302 04/16/23 0605  WBC 13.2* 11.0* 9.9  RBC 6.19* 5.12 5.21  HGB 18.2* 15.2 15.3  HCT 53.8* 45.5 45.6  MCV 86.9 88.9 87.5  MCH 29.4 29.7 29.4  MCHC 33.8 33.4 33.6  RDW 13.9 13.8 13.8  PLT 211 164 153   Thyroid  Recent Labs  Lab 04/15/23 1750  TSH 0.242*  FREET4 1.28*    BNPNo results for input(s): "BNP", "PROBNP" in the last 168 hours.  DDimer No results for input(s): "DDIMER" in the last 168 hours.   Radiology    ECHOCARDIOGRAM COMPLETE  Result Date: 04/15/2023    ECHOCARDIOGRAM REPORT   Patient Name:   Samuel [name] Date of Exam: 04/15/2023 Medical Rec #:  [id number]   Height:       66.0 in Accession #:    [id number]  Weight:       203.9 lb Date of Birth:  [birth year]   BSA:          2.016 m Patient Age:    77 years    BP:           127/67 mmHg Patient Gender: M           HR:           61 bpm. Exam Location:  Inpatient Procedure: 2D Echo, Cardiac Doppler and Color Doppler Indications:    Pulmonary embolus  History:        Patient has no prior history of Echocardiogram examinations.                  Arrythmias:Bradycardia; Risk Factors:Diabetes and Former Smoker.  Sonographer:    Milda Smart Referring Phys: [id number] [facility] GOEL  Sonographer Comments: Image acquisition challenging due to respiratory motion. IMPRESSIONS  1. Left ventricular ejection fraction, by estimation, is 55%. The left ventricle has normal function. The left ventricle has no regional wall motion abnormalities. Left ventricular diastolic parameters are consistent with Grade I diastolic dysfunction (impaired relaxation).  2. Right ventricular systolic function is normal. The right ventricular size is normal. Tricuspid regurgitation signal is inadequate for assessing PA pressure.  3. The mitral valve is normal in structure. Trivial mitral valve regurgitation. No evidence of mitral stenosis.  4. The aortic valve is grossly normal. Aortic valve regurgitation is mild to moderate. Aortic valve sclerosis is present, with no evidence of aortic valve stenosis.  5. The inferior vena cava is normal in size with greater than 50% respiratory variability, suggesting right atrial pressure of 3 mmHg. FINDINGS  Left Ventricle: Left ventricular ejection fraction, by estimation, is 55%. The left ventricle has normal function. The left ventricle has no regional wall motion abnormalities. The left ventricular internal cavity size was normal in size. There is no left ventricular hypertrophy. Left ventricular diastolic parameters are consistent with Grade I diastolic dysfunction (impaired relaxation). Right Ventricle: The right ventricular size is normal. No increase in right ventricular wall thickness. Right ventricular systolic function is normal. Tricuspid regurgitation signal is inadequate for assessing PA pressure. The tricuspid regurgitant velocity is 1.34 m/s, and with an assumed right atrial pressure of 3 mmHg, the estimated right ventricular systolic pressure is 10.2 mmHg. Left Atrium: Left atrial size was normal in size. Right Atrium: Right atrial  size was normal in size. Pericardium: There  Systemic VTI:  0.26 m                            Systemic Diam: 2.50 cm [name] MD Electronically signed by [name] MD Signature Date/Time: 04/15/2023/3:27:38 PM    Final    VAS [place] LOWER EXTREMITY VENOUS (DVT)  Result Date: 04/15/2023  Lower Venous DVT Study Patient Name:  ADARIUS TIGGES  Date of Exam:   04/15/2023 Medical Rec #: [id number]    Accession #:    [id number] Date of Birth: 06-25-1946    Patient Gender: M Patient Age:   66 years Exam Location:  [facility] Procedure:      VAS [place] LOWER EXTREMITY VENOUS (DVT) Referring Phys: [facility] GOEL --------------------------------------------------------------------------------  Indications: Pulmonary embolism.  Comparison Study: No previous exams Performing Technologist: Jody Hill RVT, RDMS  Examination Guidelines: A complete evaluation includes B-mode imaging, spectral Doppler, color Doppler, and power Doppler as needed of all accessible portions of each vessel. Bilateral testing is considered an integral part of a complete examination. Limited examinations for reoccurring indications may be performed as noted. The reflux portion of the exam is performed with the patient in reverse Trendelenburg.  +---------+---------------+---------+-----------+----------+--------------+ RIGHT    CompressibilityPhasicitySpontaneityPropertiesThrombus Aging +---------+---------------+---------+-----------+----------+--------------+ CFV      Full           Yes      Yes                                  +---------+---------------+---------+-----------+----------+--------------+ SFJ      Full                                                        +---------+---------------+---------+-----------+----------+--------------+ FV Prox  Full           Yes      Yes                                 +---------+---------------+---------+-----------+----------+--------------+ FV Mid   Full           Yes      Yes                                 +---------+---------------+---------+-----------+----------+--------------+ FV DistalFull           Yes      Yes                                 +---------+---------------+---------+-----------+----------+--------------+ PFV      Full                                                        +---------+---------------+---------+-----------+----------+--------------+ POP      Full           Yes      Yes                                 +---------+---------------+---------+-----------+----------+--------------+  Systemic VTI:  0.26 m                            Systemic Diam: 2.50 cm [name] MD Electronically signed by [name] MD Signature Date/Time: 04/15/2023/3:27:38 PM    Final    VAS [place] LOWER EXTREMITY VENOUS (DVT)  Result Date: 04/15/2023  Lower Venous DVT Study Patient Name:  ADARIUS TIGGES  Date of Exam:   04/15/2023 Medical Rec #: [id number]    Accession #:    [id number] Date of Birth: 06-25-1946    Patient Gender: M Patient Age:   66 years Exam Location:  [facility] Procedure:      VAS [place] LOWER EXTREMITY VENOUS (DVT) Referring Phys: [facility] GOEL --------------------------------------------------------------------------------  Indications: Pulmonary embolism.  Comparison Study: No previous exams Performing Technologist: Jody Hill RVT, RDMS  Examination Guidelines: A complete evaluation includes B-mode imaging, spectral Doppler, color Doppler, and power Doppler as needed of all accessible portions of each vessel. Bilateral testing is considered an integral part of a complete examination. Limited examinations for reoccurring indications may be performed as noted. The reflux portion of the exam is performed with the patient in reverse Trendelenburg.  +---------+---------------+---------+-----------+----------+--------------+ RIGHT    CompressibilityPhasicitySpontaneityPropertiesThrombus Aging +---------+---------------+---------+-----------+----------+--------------+ CFV      Full           Yes      Yes                                  +---------+---------------+---------+-----------+----------+--------------+ SFJ      Full                                                        +---------+---------------+---------+-----------+----------+--------------+ FV Prox  Full           Yes      Yes                                 +---------+---------------+---------+-----------+----------+--------------+ FV Mid   Full           Yes      Yes                                 +---------+---------------+---------+-----------+----------+--------------+ FV DistalFull           Yes      Yes                                 +---------+---------------+---------+-----------+----------+--------------+ PFV      Full                                                        +---------+---------------+---------+-----------+----------+--------------+ POP      Full           Yes      Yes                                 +---------+---------------+---------+-----------+----------+--------------+

## 2023-04-16 NOTE — Progress Notes (Signed)
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

## 2023-04-16 NOTE — Significant Event (Addendum)
  Bradycardia Second-degree type II AV block - Patient's bedside nurse reporting that while patient was sleeping heart rate dropping to 29 to 30.  When wake patient up heart rate improved to 58 again heart rate dropped to 28 immediately when patient went back to sleep. - Patient reporting chest pain for last 2 weeks.  Denies any dizziness, chest pressure and shortness of breath. -Patient received atropine 1 mg around 11 AM 10/12. - Per chart review patient has been seen by cardiology plan for evaluation by EP tomorrow and possible PPM placement. - Obtaining stat EKG -Checking stat troponin level. - Checking BMP and mag level - Giving atropine 1 mg 1 dose tonight.  Ordered atropine 1 mg 2 more doses as needed for symptomatic bradycardia (bradycardia with dizziness, chest pain, chest pressure, shortness of breath, confusion and altered mentation). -Continue cardiac monitoring  Addendum: -Obtain EKG after giving 1 mg of atropine.  EKG showing normal sinus rhythm heart rate 85.  There is no ST anterior abnormality.  Right bundle branch block and left axis deviation. -Electrolytes within normal range. - High sensitive troponin 67 which is about the same as compared to 2 days ago.  EKG unremarkable for any ST and T wave abnormality.  Chest chest pressure patient is complaining about likely noncardiac origin from underlying PE.  ACS ruled out.  CRITICAL CARE Performed by: Tereasa Coop, MD     Total critical care time: 45 minutes   Critical care time was exclusive of separately billable procedures and treating other patients.   Critical care was necessary to treat or prevent imminent or life-threatening deterioration.   Critical care was time spent personally by me on the following activities: development of treatment plan with patient and/or surrogate as well as nursing, discussions with consultants, evaluation of patient's response to treatment, examination of patient, obtaining history from  patient or surrogate, ordering and performing treatments and interventions, ordering and review of laboratory studies, ordering and review of radiographic studies, pulse oximetry and re-evaluation of patient's condition.    Tereasa Coop, MD [facility] 04/16/2023, 12:40 AM

## 2023-04-16 NOTE — Progress Notes (Signed)
discuss the case this evening with Cardiology as a patient continues to have low sustained heart rates at times into the high 30s and is a little diaphoretic-they feel that this may be related to excessive vagal tone. I have discussed with on-call coverage tonight for hospitalist service after seeing the patient. The blood pressure seems stable. He is not an extra miss and his heart rate has rebounded after several doses of atropine. Patient will probably need IV Domine GTT if heart rate sustain low for prolonged period of time if atropine does not work and may need more emergent cardiology work up/pacing if becomes unresponsive patient has pace of pads on and on-call cardiology coverage has also been alerted by me late this evening.

## 2023-04-17 ENCOUNTER — Inpatient Hospital Stay (HOSPITAL_COMMUNITY): Payer: 59

## 2023-04-17 DIAGNOSIS — R001 Bradycardia, unspecified: Secondary | ICD-10-CM | POA: Diagnosis not present

## 2023-04-17 DIAGNOSIS — I2699 Other pulmonary embolism without acute cor pulmonale: Secondary | ICD-10-CM | POA: Diagnosis not present

## 2023-04-17 LAB — HEPARIN LEVEL (UNFRACTIONATED): Heparin Unfractionated: 0.48 [IU]/mL (ref 0.30–0.70)

## 2023-04-17 LAB — CBC
HCT: 46 % (ref 39.0–52.0)
Hemoglobin: 15.4 g/dL (ref 13.0–17.0)
MCH: 28.9 pg (ref 26.0–34.0)
MCHC: 33.5 g/dL (ref 30.0–36.0)
MCV: 86.3 fL (ref 80.0–100.0)
Platelets: 149 10*3/uL — ABNORMAL LOW (ref 150–400)
RBC: 5.33 MIL/uL (ref 4.22–5.81)
RDW: 13.6 % (ref 11.5–15.5)
WBC: 10.4 10*3/uL (ref 4.0–10.5)
nRBC: 0 % (ref 0.0–0.2)

## 2023-04-17 LAB — GLUCOSE, CAPILLARY
Glucose-Capillary: 79 mg/dL (ref 70–99)
Glucose-Capillary: 74 mg/dL (ref 70–99)
Glucose-Capillary: 88 mg/dL (ref 70–99)
Glucose-Capillary: 69 mg/dL — ABNORMAL LOW (ref 70–99)

## 2023-04-17 LAB — T3, FREE: T3, Free: 1.9 pg/mL — ABNORMAL LOW (ref 2.0–4.4)

## 2023-04-17 MED ORDER — DEXTROSE 50 % IV SOLN
INTRAVENOUS | Status: AC
  Administered 2023-04-17: 25 mL
  Filled 2023-04-17: qty 50

## 2023-04-17 MED ORDER — GUAIFENESIN ER 600 MG PO TB12
600.0000 mg | ORAL_TABLET | Freq: Two times a day (BID) | ORAL | Status: DC
  Filled 2023-04-17: qty 1

## 2023-04-17 NOTE — Consult Note (Signed)
FREET4 1.28*    BNPNo results for input(s): "BNP", "PROBNP" in the last 168 hours.  DDimer No results for input(s): "DDIMER" in the last 168 hours.   Radiology/Studies:  VAS [place] LOWER EXTREMITY VENOUS (DVT) Result Date: 04/15/2023  Lower Venous DVT Study Patient Name:  Samuel [name]  Date of Exam:   04/15/2023 Medical Rec #: [id number]    Accession #:    [id number] Date of Birth:  01-12-1946    Patient Gender: M Patient Age:   77 years Exam Location:  [facility] Procedure:      VAS [place] LOWER EXTREMITY VENOUS (DVT) Referring Phys: [facility] GOEL --------------------------------------------------------------------------------  Indications: Pulmonary embolism.  Comparison Study: No previous exams Performing Technologist: Jody Hill RVT, RDMS  Examination Guidelines: A complete evaluation includes B-mode imaging, spectral Doppler, color Doppler, and power Doppler as needed of all accessible portions of each vessel. Bilateral testing is considered an integral part of a complete examination. Limited examinations for reoccurring indications may be performed as noted. The reflux portion of the exam is performed with the patient in reverse Trendelenburg.            Summary: BILATERAL: -No evidence of popliteal cyst, bilaterally. RIGHT: - Findings consistent with acute deep vein thrombosis involving the right posterior tibial veins.   LEFT: - Findings consistent with acute deep vein thrombosis involving the left posterior tibial veins.   *See table(s) above for measurements and observations. Electronically signed by Carolynn Sayers on 04/15/2023 at 1:25:45 PM.    Final    US Abdomen Limited RUQ (LIVER/GB) Result Date: 04/15/2023 CLINICAL DATA:  Elevated LFTs EXAM: ULTRASOUND ABDOMEN LIMITED RIGHT UPPER QUADRANT COMPARISON:  Chest CT today FINDINGS: Gallbladder: Stones and sludge seen within the gallbladder. No wall thickening. The patient was tender over the gallbladder during the study. Common bile duct: Diameter: Normal caliber, 3 mm Liver: Increased echotexture compatible with fatty infiltration. No focal abnormality or biliary ductal dilatation. Portal vein is patent on color Doppler imaging with normal direction of blood flow towards the liver. Other: None. IMPRESSION: Gallstones and sludge within the gallbladder. The patient was tender over the gallbladder during the study. Recommend  clinical correlation for possible cholecystitis. Fatty liver. Electronically Signed   By: Charlett Nose M.D.   On: 04/15/2023 00:20   CT Soft Tissue Neck W Contrast Result Date: 04/14/2023 CLINICAL DATA:  Initial evaluation for acute difficulty swallowing over past few days. EXAM: CT NECK WITH CONTRAST TECHNIQUE: Multidetector CT imaging of the neck was performed using the standard protocol following the bolus administration of intravenous contrast. RADIATION DOSE REDUCTION: This exam was performed according to the departmental dose-optimization program which includes automated exposure control, adjustment of the mA and/or kV according to patient size and/or use of iterative reconstruction technique. CONTRAST:  OMNIPAQUE IOHEXOL 300 MG/ML  SOLN COMPARISON:  None Available. FINDINGS: Pharynx and larynx: Oral cavity within normal limits. Oropharynx and nasopharynx within normal limits. No retropharyngeal collection or swelling. Epiglottis within normal limits. Hypopharynx and supraglottic larynx normal. Glottis symmetric and normal. Subglottic airway patent clear. Salivary glands: Salivary glands including the parotid and submandibular glands are within normal limits. Thyroid: Normal. Lymph nodes: No enlarged or pathologic adenopathy. Vascular: Normal intravascular enhancement seen within the neck. Mild atheromatous change about the carotid bifurcations. Limited intracranial: Unremarkable. Visualized orbits: Unremarkable. Mastoids and visualized paranasal sinuses: Clear/normal. Skeleton: 6 mm sclerotic lesion within the T2 vertebral body noted, nonspecific. No other worrisome osseous lesions. 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  FREET4 1.28*    BNPNo results for input(s): "BNP", "PROBNP" in the last 168 hours.  DDimer No results for input(s): "DDIMER" in the last 168 hours.   Radiology/Studies:  VAS [place] LOWER EXTREMITY VENOUS (DVT) Result Date: 04/15/2023  Lower Venous DVT Study Patient Name:  Samuel [name]  Date of Exam:   04/15/2023 Medical Rec #: [id number]    Accession #:    [id number] Date of Birth:  01-12-1946    Patient Gender: M Patient Age:   77 years Exam Location:  [facility] Procedure:      VAS [place] LOWER EXTREMITY VENOUS (DVT) Referring Phys: [facility] GOEL --------------------------------------------------------------------------------  Indications: Pulmonary embolism.  Comparison Study: No previous exams Performing Technologist: Jody Hill RVT, RDMS  Examination Guidelines: A complete evaluation includes B-mode imaging, spectral Doppler, color Doppler, and power Doppler as needed of all accessible portions of each vessel. Bilateral testing is considered an integral part of a complete examination. Limited examinations for reoccurring indications may be performed as noted. The reflux portion of the exam is performed with the patient in reverse Trendelenburg.            Summary: BILATERAL: -No evidence of popliteal cyst, bilaterally. RIGHT: - Findings consistent with acute deep vein thrombosis involving the right posterior tibial veins.   LEFT: - Findings consistent with acute deep vein thrombosis involving the left posterior tibial veins.   *See table(s) above for measurements and observations. Electronically signed by Carolynn Sayers on 04/15/2023 at 1:25:45 PM.    Final    US Abdomen Limited RUQ (LIVER/GB) Result Date: 04/15/2023 CLINICAL DATA:  Elevated LFTs EXAM: ULTRASOUND ABDOMEN LIMITED RIGHT UPPER QUADRANT COMPARISON:  Chest CT today FINDINGS: Gallbladder: Stones and sludge seen within the gallbladder. No wall thickening. The patient was tender over the gallbladder during the study. Common bile duct: Diameter: Normal caliber, 3 mm Liver: Increased echotexture compatible with fatty infiltration. No focal abnormality or biliary ductal dilatation. Portal vein is patent on color Doppler imaging with normal direction of blood flow towards the liver. Other: None. IMPRESSION: Gallstones and sludge within the gallbladder. The patient was tender over the gallbladder during the study. Recommend  clinical correlation for possible cholecystitis. Fatty liver. Electronically Signed   By: Charlett Nose M.D.   On: 04/15/2023 00:20   CT Soft Tissue Neck W Contrast Result Date: 04/14/2023 CLINICAL DATA:  Initial evaluation for acute difficulty swallowing over past few days. EXAM: CT NECK WITH CONTRAST TECHNIQUE: Multidetector CT imaging of the neck was performed using the standard protocol following the bolus administration of intravenous contrast. RADIATION DOSE REDUCTION: This exam was performed according to the departmental dose-optimization program which includes automated exposure control, adjustment of the mA and/or kV according to patient size and/or use of iterative reconstruction technique. CONTRAST:  OMNIPAQUE IOHEXOL 300 MG/ML  SOLN COMPARISON:  None Available. FINDINGS: Pharynx and larynx: Oral cavity within normal limits. Oropharynx and nasopharynx within normal limits. No retropharyngeal collection or swelling. Epiglottis within normal limits. Hypopharynx and supraglottic larynx normal. Glottis symmetric and normal. Subglottic airway patent clear. Salivary glands: Salivary glands including the parotid and submandibular glands are within normal limits. Thyroid: Normal. Lymph nodes: No enlarged or pathologic adenopathy. Vascular: Normal intravascular enhancement seen within the neck. Mild atheromatous change about the carotid bifurcations. Limited intracranial: Unremarkable. Visualized orbits: Unremarkable. Mastoids and visualized paranasal sinuses: Clear/normal. Skeleton: 6 mm sclerotic lesion within the T2 vertebral body noted, nonspecific. No other worrisome osseous lesions. 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  FREET4 1.28*    BNPNo results for input(s): "BNP", "PROBNP" in the last 168 hours.  DDimer No results for input(s): "DDIMER" in the last 168 hours.   Radiology/Studies:  VAS [place] LOWER EXTREMITY VENOUS (DVT) Result Date: 04/15/2023  Lower Venous DVT Study Patient Name:  Samuel [name]  Date of Exam:   04/15/2023 Medical Rec #: [id number]    Accession #:    [id number] Date of Birth:  01-12-1946    Patient Gender: M Patient Age:   77 years Exam Location:  [facility] Procedure:      VAS [place] LOWER EXTREMITY VENOUS (DVT) Referring Phys: [facility] GOEL --------------------------------------------------------------------------------  Indications: Pulmonary embolism.  Comparison Study: No previous exams Performing Technologist: Jody Hill RVT, RDMS  Examination Guidelines: A complete evaluation includes B-mode imaging, spectral Doppler, color Doppler, and power Doppler as needed of all accessible portions of each vessel. Bilateral testing is considered an integral part of a complete examination. Limited examinations for reoccurring indications may be performed as noted. The reflux portion of the exam is performed with the patient in reverse Trendelenburg.            Summary: BILATERAL: -No evidence of popliteal cyst, bilaterally. RIGHT: - Findings consistent with acute deep vein thrombosis involving the right posterior tibial veins.   LEFT: - Findings consistent with acute deep vein thrombosis involving the left posterior tibial veins.   *See table(s) above for measurements and observations. Electronically signed by Carolynn Sayers on 04/15/2023 at 1:25:45 PM.    Final    US Abdomen Limited RUQ (LIVER/GB) Result Date: 04/15/2023 CLINICAL DATA:  Elevated LFTs EXAM: ULTRASOUND ABDOMEN LIMITED RIGHT UPPER QUADRANT COMPARISON:  Chest CT today FINDINGS: Gallbladder: Stones and sludge seen within the gallbladder. No wall thickening. The patient was tender over the gallbladder during the study. Common bile duct: Diameter: Normal caliber, 3 mm Liver: Increased echotexture compatible with fatty infiltration. No focal abnormality or biliary ductal dilatation. Portal vein is patent on color Doppler imaging with normal direction of blood flow towards the liver. Other: None. IMPRESSION: Gallstones and sludge within the gallbladder. The patient was tender over the gallbladder during the study. Recommend  clinical correlation for possible cholecystitis. Fatty liver. Electronically Signed   By: Charlett Nose M.D.   On: 04/15/2023 00:20   CT Soft Tissue Neck W Contrast Result Date: 04/14/2023 CLINICAL DATA:  Initial evaluation for acute difficulty swallowing over past few days. EXAM: CT NECK WITH CONTRAST TECHNIQUE: Multidetector CT imaging of the neck was performed using the standard protocol following the bolus administration of intravenous contrast. RADIATION DOSE REDUCTION: This exam was performed according to the departmental dose-optimization program which includes automated exposure control, adjustment of the mA and/or kV according to patient size and/or use of iterative reconstruction technique. CONTRAST:  OMNIPAQUE IOHEXOL 300 MG/ML  SOLN COMPARISON:  None Available. FINDINGS: Pharynx and larynx: Oral cavity within normal limits. Oropharynx and nasopharynx within normal limits. No retropharyngeal collection or swelling. Epiglottis within normal limits. Hypopharynx and supraglottic larynx normal. Glottis symmetric and normal. Subglottic airway patent clear. Salivary glands: Salivary glands including the parotid and submandibular glands are within normal limits. Thyroid: Normal. Lymph nodes: No enlarged or pathologic adenopathy. Vascular: Normal intravascular enhancement seen within the neck. Mild atheromatous change about the carotid bifurcations. Limited intracranial: Unremarkable. Visualized orbits: Unremarkable. Mastoids and visualized paranasal sinuses: Clear/normal. Skeleton: 6 mm sclerotic lesion within the T2 vertebral body noted, nonspecific. No other worrisome osseous lesions. 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  Cardiology Consultation   Patient ID: Samuel [name] MRN: [id number]; DOB: 01-Oct-1945  Admit date: 04/14/2023 Date of Consult: 04/17/2023  PCP:  [name], MD   [facility] Providers Cardiologist:  [name], MD        Patient Profile:   Samuel [name] is a 77 y.o. male with a hx of DM who is being seen 04/17/2023 for the evaluation of heart block/bradycardia at the request of Dr. Wyline Mood.  History of Present Illness:   Mr. Woodrick with weeks of progressive dysphagia, to the point of no oral intake in the 24 hours preceding his coming in, some associated abdominal pain as well, he also mentioned pain with deep inspiration, no SOB  He has been found with acute PE: right main pulmonary artery extending into the upper and lower lobes no R hear strain Acute b/l LE DVTs: b/l posterior tibial veins GI w/u in progress  Intermittent 2:1 AVBLock  Cardiology called for observation of intermittent heart blocc EP asked to the case today  LABS K+ 3.5 >> 3.6 Mag 2.4 BUN/Creat 26/0.93 >> 0.89 Abn LFTs >>>last  AST 50 and ALT 58 T.bili: 1.5 HS Trop 56, 67 WBC 13.2 >> 11.0 > 10.4 H/H 18/53 >>> 15/46 Plts 211 >> 149 TSH 0.242 Free T3 1.9 T4 1/28  Heparin gtt  Home meds reviewed with no nodal blocking agents or here  The patient denies syncope, he reports for some time feeling unsteady on his feet, uses a cane, some days wishes he had a walker. No syncope or near syncope. Perhaps lightheaded No near syncope or syncope here He has made observations of his heart rate slow here, though not clearly with any symptoms.  AFter we left, interestingly, his RN spoke to [place] that previously when asked about any home symptoms, he denied any, reported he felt well, strong, cares for a set of twins who are in some way disabled. Apparently a much different story then today's    Past Medical History:  Diagnosis Date   Diabetes mellitus without complication (HCC)     Past  Surgical History:  Procedure Laterality Date   KNEE ARTHROPLASTY Bilateral      Home Medications:  Prior to Admission medications   Medication Sig Start Date End Date Taking? Authorizing Provider  metFORMIN (GLUCOPHAGE) 500 MG tablet Take 1 tablet (500 mg total) by mouth daily with breakfast. 12/19/22  Yes [name], MD  Multiple Vitamins-Minerals (MULTIVITAMIN MEN) TABS Take 1 tablet by mouth daily with breakfast.   Yes [provider]  Blood Glucose Monitoring Suppl (ACCU-CHEK GUIDE ME) w/Device KIT USE AS DIRECTED 3 TIMES A DAY 01/16/23   [name], MD  glucose blood (ACCU-CHEK GUIDE) test strip Use as instructed daily 12/19/22   [name], MD    Inpatient Medications: Scheduled Meds:  influenza vaccine adjuvanted  0.5 mL Intramuscular Tomorrow-1000   insulin aspart  0-15 Units Subcutaneous TID WC   pantoprazole (PROTONIX) IV  40 mg Intravenous QHS   Continuous Infusions:  sodium chloride 40 mL/hr at 04/16/23 1817   heparin 750 Units/hr (04/17/23 0700)   piperacillin-tazobactam (ZOSYN)  IV 12.5 mL/hr at 04/17/23 0700   PRN Meds: acetaminophen **OR** acetaminophen, atropine, polyethylene glycol  Allergies:    Allergies  Allergen Reactions   Penicillins     Shortness of breath   Sulfa Antibiotics     Blisters    Procaine Other (See Comments)    Caused fatigue    Social History:  Cardiology Consultation   Patient ID: Samuel [name] MRN: [id number]; DOB: 01-Oct-1945  Admit date: 04/14/2023 Date of Consult: 04/17/2023  PCP:  [name], MD   [facility] Providers Cardiologist:  [name], MD        Patient Profile:   Samuel [name] is a 77 y.o. male with a hx of DM who is being seen 04/17/2023 for the evaluation of heart block/bradycardia at the request of Dr. Wyline Mood.  History of Present Illness:   Mr. Woodrick with weeks of progressive dysphagia, to the point of no oral intake in the 24 hours preceding his coming in, some associated abdominal pain as well, he also mentioned pain with deep inspiration, no SOB  He has been found with acute PE: right main pulmonary artery extending into the upper and lower lobes no R hear strain Acute b/l LE DVTs: b/l posterior tibial veins GI w/u in progress  Intermittent 2:1 AVBLock  Cardiology called for observation of intermittent heart blocc EP asked to the case today  LABS K+ 3.5 >> 3.6 Mag 2.4 BUN/Creat 26/0.93 >> 0.89 Abn LFTs >>>last  AST 50 and ALT 58 T.bili: 1.5 HS Trop 56, 67 WBC 13.2 >> 11.0 > 10.4 H/H 18/53 >>> 15/46 Plts 211 >> 149 TSH 0.242 Free T3 1.9 T4 1/28  Heparin gtt  Home meds reviewed with no nodal blocking agents or here  The patient denies syncope, he reports for some time feeling unsteady on his feet, uses a cane, some days wishes he had a walker. No syncope or near syncope. Perhaps lightheaded No near syncope or syncope here He has made observations of his heart rate slow here, though not clearly with any symptoms.  AFter we left, interestingly, his RN spoke to [place] that previously when asked about any home symptoms, he denied any, reported he felt well, strong, cares for a set of twins who are in some way disabled. Apparently a much different story then today's    Past Medical History:  Diagnosis Date   Diabetes mellitus without complication (HCC)     Past  Surgical History:  Procedure Laterality Date   KNEE ARTHROPLASTY Bilateral      Home Medications:  Prior to Admission medications   Medication Sig Start Date End Date Taking? Authorizing Provider  metFORMIN (GLUCOPHAGE) 500 MG tablet Take 1 tablet (500 mg total) by mouth daily with breakfast. 12/19/22  Yes [name], MD  Multiple Vitamins-Minerals (MULTIVITAMIN MEN) TABS Take 1 tablet by mouth daily with breakfast.   Yes [provider]  Blood Glucose Monitoring Suppl (ACCU-CHEK GUIDE ME) w/Device KIT USE AS DIRECTED 3 TIMES A DAY 01/16/23   [name], MD  glucose blood (ACCU-CHEK GUIDE) test strip Use as instructed daily 12/19/22   [name], MD    Inpatient Medications: Scheduled Meds:  influenza vaccine adjuvanted  0.5 mL Intramuscular Tomorrow-1000   insulin aspart  0-15 Units Subcutaneous TID WC   pantoprazole (PROTONIX) IV  40 mg Intravenous QHS   Continuous Infusions:  sodium chloride 40 mL/hr at 04/16/23 1817   heparin 750 Units/hr (04/17/23 0700)   piperacillin-tazobactam (ZOSYN)  IV 12.5 mL/hr at 04/17/23 0700   PRN Meds: acetaminophen **OR** acetaminophen, atropine, polyethylene glycol  Allergies:    Allergies  Allergen Reactions   Penicillins     Shortness of breath   Sulfa Antibiotics     Blisters    Procaine Other (See Comments)    Caused fatigue    Social History:  Cardiology Consultation   Patient ID: Samuel [name] MRN: [id number]; DOB: 01-Oct-1945  Admit date: 04/14/2023 Date of Consult: 04/17/2023  PCP:  [name], MD   [facility] Providers Cardiologist:  [name], MD        Patient Profile:   Samuel [name] is a 77 y.o. male with a hx of DM who is being seen 04/17/2023 for the evaluation of heart block/bradycardia at the request of Dr. Wyline Mood.  History of Present Illness:   Mr. Woodrick with weeks of progressive dysphagia, to the point of no oral intake in the 24 hours preceding his coming in, some associated abdominal pain as well, he also mentioned pain with deep inspiration, no SOB  He has been found with acute PE: right main pulmonary artery extending into the upper and lower lobes no R hear strain Acute b/l LE DVTs: b/l posterior tibial veins GI w/u in progress  Intermittent 2:1 AVBLock  Cardiology called for observation of intermittent heart blocc EP asked to the case today  LABS K+ 3.5 >> 3.6 Mag 2.4 BUN/Creat 26/0.93 >> 0.89 Abn LFTs >>>last  AST 50 and ALT 58 T.bili: 1.5 HS Trop 56, 67 WBC 13.2 >> 11.0 > 10.4 H/H 18/53 >>> 15/46 Plts 211 >> 149 TSH 0.242 Free T3 1.9 T4 1/28  Heparin gtt  Home meds reviewed with no nodal blocking agents or here  The patient denies syncope, he reports for some time feeling unsteady on his feet, uses a cane, some days wishes he had a walker. No syncope or near syncope. Perhaps lightheaded No near syncope or syncope here He has made observations of his heart rate slow here, though not clearly with any symptoms.  AFter we left, interestingly, his RN spoke to [place] that previously when asked about any home symptoms, he denied any, reported he felt well, strong, cares for a set of twins who are in some way disabled. Apparently a much different story then today's    Past Medical History:  Diagnosis Date   Diabetes mellitus without complication (HCC)     Past  Surgical History:  Procedure Laterality Date   KNEE ARTHROPLASTY Bilateral      Home Medications:  Prior to Admission medications   Medication Sig Start Date End Date Taking? Authorizing Provider  metFORMIN (GLUCOPHAGE) 500 MG tablet Take 1 tablet (500 mg total) by mouth daily with breakfast. 12/19/22  Yes [name], MD  Multiple Vitamins-Minerals (MULTIVITAMIN MEN) TABS Take 1 tablet by mouth daily with breakfast.   Yes [provider]  Blood Glucose Monitoring Suppl (ACCU-CHEK GUIDE ME) w/Device KIT USE AS DIRECTED 3 TIMES A DAY 01/16/23   [name], MD  glucose blood (ACCU-CHEK GUIDE) test strip Use as instructed daily 12/19/22   [name], MD    Inpatient Medications: Scheduled Meds:  influenza vaccine adjuvanted  0.5 mL Intramuscular Tomorrow-1000   insulin aspart  0-15 Units Subcutaneous TID WC   pantoprazole (PROTONIX) IV  40 mg Intravenous QHS   Continuous Infusions:  sodium chloride 40 mL/hr at 04/16/23 1817   heparin 750 Units/hr (04/17/23 0700)   piperacillin-tazobactam (ZOSYN)  IV 12.5 mL/hr at 04/17/23 0700   PRN Meds: acetaminophen **OR** acetaminophen, atropine, polyethylene glycol  Allergies:    Allergies  Allergen Reactions   Penicillins     Shortness of breath   Sulfa Antibiotics     Blisters    Procaine Other (See Comments)    Caused fatigue    Social History:

## 2023-04-17 NOTE — Progress Notes (Addendum)
HOSPITALIST ROUNDING NOTE Samuel [name] FAO:[id number]  DOB: 28-Aug-1945  DOA: 04/14/2023  PCP: [name], MD  04/17/2023,10:30 AM   LOS: 3 days      Code Status: Full From: Home  current Dispo: Unclear    77 year old white male Prediabetes 6.2 Bilateral knee arthroscopies  Insidious onset dysphagia solids >liquids X 2 weeks  Associated with chest pain worse with deep inspiration at the time with anorexia CT scan right-sided PE started on heparin gtt. Found to have second-degree AVB with heart rates into the 30s and was transferred to [facility] after being given atropine and pacer PLEDs were placed--- patient initiated on heparin US abdomen = gallstones/gallbladder?  Positive Murphy sign--CT neck = 6 mm sclerotic T2 vertebral body lesion recommending MRI 10/12 GI was consulted-We are planning for barium swallow  Echocardiogram LVEF 55% grade 1 DD right heart strain on echo   EKG = second-degree AV block heart rate 30s RBBB-patient transferred to [facility] for EP evaluation?  Pacemaker placement  10/13 early a.m. 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 Barium swallow as above-needs stability from cardiac arrhythmia prior to endoscopy-GI to come by and determine timing  Prediabetes A1c 6.2 Metformin allegedly causes diarrhea-outpatient  follow-up with PCP-sugars are well below 100 so I am not even sure if he needs [place] going forward  Hyperthyroidism suspected TSH 0.2 T41.2 thyroid does not appear enlarged-CT argues against thyroid swelling-outpatient TSH  Transaminitis slight hyperbilirubinemia- ?  Gallbladder stones with positive Murphy sign-leukocytosis on admission Nonemergent follow-up with general surgery with regards to stones-LFTs are minimally elevated alk phos is low --not cholecystitis can order HIDA scan not emergently in the next 24 hours once we know a little bit more about pacemaker placement    DVT prophylaxis: Heparin GTT  Status is: Inpatient Remains inpatient appropriate because:   Requires further workup   Subjective:  Vomiting several times so we are keeping n.p.o. Just back from barium swallow which shows long segment stenosis-he is quite uncomfortable with the coughing and nausea No overt abdominal pain No diarrhea  Objective + exam Vitals:   04/16/23 1957 04/16/23 2343 04/17/23 0313 04/17/23 0747  BP: (!) 127/54 118/69 137/67 (!) 145/60  Pulse: (!) 55 74 (!) 50 (!) 36  Resp: 18 20 14 12   Temp: 98.4 F (36.9 C) 98 F (36.7 C) 98.1 F (36.7 C) 98.1 F (36.7 C)  TempSrc: Oral Oral Oral Oral  SpO2: 97% 97% 94% 98%  Weight:      Height:       Filed Weights   04/14/23 1247  Weight: 92.5 kg    Examination:  Well-appearing looks younger than stated age no icterus no pallor Chest clear no wheeze Sinus bradycardia noted heart rates in the 30s at times but improved No lower extremity edema Abdomen is nontender no rebound no guarding   Data Reviewed: reviewed   CBC  Component Value Date/Time   WBC 10.4 04/17/2023 0404   RBC 5.33 04/17/2023 0404   HGB 15.4 04/17/2023 0404   HGB 15.4 08/09/2022 1425   HCT 46.0 04/17/2023 0404   HCT 46.0 08/09/2022 1425   PLT 149 (L) 04/17/2023 0404   PLT 281 08/09/2022 1425   MCV 86.3 04/17/2023 0404   MCV 87 08/09/2022 1425   MCH 28.9  04/17/2023 0404   MCHC 33.5 04/17/2023 0404   RDW 13.6 04/17/2023 0404   RDW 12.8 08/09/2022 1425   LYMPHSABS 2.3 04/14/2023 1416   LYMPHSABS 2.8 08/09/2022 1425   MONOABS 1.3 (H) 04/14/2023 1416   EOSABS 0.0 04/14/2023 1416   EOSABS 0.4 08/09/2022 1425   BASOSABS 0.0 04/14/2023 1416   BASOSABS 0.1 08/09/2022 1425      Latest Ref Rng & Units 04/16/2023    6:05 AM 04/16/2023    1:01 AM 04/15/2023    3:02 AM  CMP  Glucose 70 - 99 mg/dL 78  82  82   BUN 8 - 23 mg/dL 13  13  20    Creatinine 0.61 - 1.24 mg/dL 1.60  1.09  3.23   Sodium 135 - 145 mmol/L 139  140  138   Potassium 3.5 - 5.1 mmol/L 3.6  3.6  3.5   Chloride 98 - 111 mmol/L 106  107  106   CO2 22 - 32 mmol/L 23  23  23    Calcium 8.9 - 10.3 mg/dL 8.3  8.5  8.1   Total Protein 6.5 - 8.1 g/dL 4.7   5.2   Total Bilirubin 0.3 - 1.2 mg/dL 1.5   1.9   Alkaline Phos 38 - 126 U/L 42   48   AST 15 - 41 U/L 50   55   ALT 0 - 44 U/L 58   66      Scheduled Meds:  influenza vaccine adjuvanted  0.5 mL Intramuscular Tomorrow-1000   insulin aspart  0-15 Units Subcutaneous TID WC   pantoprazole (PROTONIX) IV  40 mg Intravenous QHS   Continuous Infusions:  sodium chloride 40 mL/hr at 04/16/23 1817   heparin 750 Units/hr (04/17/23 0700)   piperacillin-tazobactam (ZOSYN)  IV 12.5 mL/hr at 04/17/23 0700    Time 70 minutes  Rhetta Mura, MD  [facility]

## 2023-04-17 NOTE — Progress Notes (Addendum)
4540 patient alert x4 able to make all needs known on 2L [place] and heparin gtt taken to have esophagus by bed 0950 patient back on unit from test

## 2023-04-17 NOTE — Progress Notes (Addendum)
Rounding Note    Patient Name: Samuel [name] Date of Encounter: 04/17/2023  [facility] Cardiologist: [name], MD   Subjective   Admitted with acute PE and noted to have asymptomatic 2:1 AV block and also having dysphagia  Denies any CP or SOB  Inpatient Medications    Scheduled Meds:  influenza vaccine adjuvanted  0.5 mL Intramuscular Tomorrow-1000   insulin aspart  0-15 Units Subcutaneous TID WC   pantoprazole (PROTONIX) IV  40 mg Intravenous QHS   Continuous Infusions:  sodium chloride 40 mL/hr at 04/16/23 1817   heparin 750 Units/hr (04/17/23 0700)   piperacillin-tazobactam (ZOSYN)  IV 12.5 mL/hr at 04/17/23 0700   PRN Meds: acetaminophen **OR** acetaminophen, atropine, polyethylene glycol   Vital Signs    Vitals:   04/16/23 1957 04/16/23 2343 04/17/23 0313 04/17/23 0747  BP: (!) 127/54 118/69 137/67 (!) 145/60  Pulse: (!) 55 74 (!) 50 (!) 36  Resp: 18 20 14 12   Temp: 98.4 F (36.9 C) 98 F (36.7 C) 98.1 F (36.7 C) 98.1 F (36.7 C)  TempSrc: Oral Oral Oral Oral  SpO2: 97% 97% 94% 98%  Weight:      Height:        Intake/Output Summary (Last 24 hours) at 04/17/2023 1031 Last data filed at 04/17/2023 0700 Gross per 24 hour  Intake 510.64 ml  Output 450 ml  Net 60.64 ml      04/14/2023   12:47 PM 12/15/2022   10:13 AM 08/09/2022    1:47 PM  Last 3 Weights  Weight (lbs) 203 lb 14.8 oz 204 lb 203 lb 6.4 oz  Weight (kg) 92.5 kg 92.534 kg 92.262 kg      Telemetry    Sinus bradycardia with 2:1 second degree AVB with HR in the 30's- Personally Reviewed  ECG    No new EKG to review - Personally Reviewed  Physical Exam   Vitals:   04/17/23 0313 04/17/23 0747  BP: 137/67 (!) 145/60  Pulse: (!) 50 (!) 36  Resp: 14 12  Temp: 98.1 F (36.7 C) 98.1 F (36.7 C)  SpO2: 94% 98%    GEN: Well nourished, well developed in no acute distress HEENT: Normal NECK: No JVD; No carotid bruits LYMPHATICS: No lymphadenopathy CARDIAC:RRR,  no murmurs, rubs, gallops RESPIRATORY:  Clear to auscultation without rales, wheezing or rhonchi  ABDOMEN: Soft, non-tender, non-distended MUSCULOSKELETAL:  No edema; No deformity  SKIN: Warm and dry NEUROLOGIC:  Alert and oriented x 3 PSYCHIATRIC:  Normal affect  Labs    High Sensitivity Troponin:   Recent Labs  Lab 04/14/23 1416 04/14/23 1752 04/16/23 0101  TROPONINIHS 56* 67* 67*     Chemistry Recent Labs  Lab 04/14/23 1416 04/15/23 0302 04/15/23 1028 04/16/23 0101 04/16/23 0605  NA 137 138  --  140 139  K 3.5 3.5  --  3.6 3.6  CL 101 106  --  107 106  CO2 21* 23  --  23 23  GLUCOSE 108* 82  --  82 78  BUN 26* 20  --  13 13  CREATININE 0.93 0.77  --  0.83 0.89  CALCIUM 9.4 8.1*  --  8.5* 8.3*  MG  --   --  2.4 2.2  --   PROT 6.8 5.2*  --   --  4.7*  ALBUMIN 3.7 2.9*  --   --  2.5*  AST 82* 55*  --   --  50*  ALT 91* 66*  --   --  58*  ALKPHOS 57 48  --   --  42  BILITOT 3.0* 1.9*  --   --  1.5*  GFRNONAA >60 >60  --  >60 >60  ANIONGAP 15 9  --  10 10    Lipids No results for input(s): "CHOL", "TRIG", "HDL", "LABVLDL", "LDLCALC", "CHOLHDL" in the last 168 hours.  Hematology Recent Labs  Lab 04/15/23 0302 04/16/23 0605 04/17/23 0404  WBC 11.0* 9.9 10.4  RBC 5.12 5.21 5.33  HGB 15.2 15.3 15.4  HCT 45.5 45.6 46.0  MCV 88.9 87.5 86.3  MCH 29.7 29.4 28.9  MCHC 33.4 33.6 33.5  RDW 13.8 13.8 13.6  PLT 164 153 149*   Thyroid  Recent Labs  Lab 04/15/23 1750  TSH 0.242*  FREET4 1.28*    BNPNo results for input(s): "BNP", "PROBNP" in the last 168 hours.  DDimer No results for input(s): "DDIMER" in the last 168 hours.   Radiology    DG ESOPHAGUS W SINGLE CM (SOL OR THIN BA)  Result Date: 04/17/2023 CLINICAL DATA:  77 year old male with trouble swallowing both liquid and solid food. EXAM: ESOPHAGUS/BARIUM SWALLOW/TABLET STUDY TECHNIQUE: Single contrast examination was performed using thin liquid barium. This exam was performed by Loyce Dys PA-C,  and was supervised and interpreted by Roanna Banning, MD. FLUOROSCOPY: Radiation Exposure Index (as provided by the fluoroscopic device): 13 mGy Kerma COMPARISON:  CT CHEST 04/14/2023. FINDINGS: Swallowing: Appears normal. No vestibular penetration or aspiration seen. Pharynx:  No masses, lesions or ulcers observed. Esophagus: Abrupt tapering in the thoracic esophagus with only a trickle of barium observed to intermittently pass into the stomach. Unable to assess for motility, hiatal hernia, and gastroesophageal reflux. 13mm barium tablet was not given. 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 Correlate with direct endoscopic visualization. Electronically Signed   By: Roanna Banning M.D.   On: 04/17/2023 10:24   ECHOCARDIOGRAM COMPLETE  Result Date: 04/15/2023    ECHOCARDIOGRAM REPORT   Patient Name:   Samuel [name] Date of Exam: 04/15/2023 Medical Rec #:  [id number]   Height:       66.0 in Accession #:    [id number]  Weight:       203.9 lb Date of Birth:  [birth year]   BSA:          2.016 m Patient Age:    77 years    BP:           127/67 mmHg Patient Gender: M           HR:           61 bpm. Exam Location:  Inpatient Procedure: 2D Echo, Cardiac Doppler and Color Doppler Indications:    Pulmonary embolus  History:        Patient has no prior history of Echocardiogram examinations.                 Arrythmias:Bradycardia; Risk Factors:Diabetes and Former Smoker.  Sonographer:    Milda Smart Referring Phys: [id number] [facility] GOEL  Sonographer Comments: Image acquisition challenging due to respiratory motion. IMPRESSIONS  1. Left ventricular ejection fraction, by estimation, is 55%. The left ventricle has normal function. The left ventricle has no regional wall motion abnormalities. Left ventricular diastolic parameters are consistent with Grade I diastolic dysfunction (impaired relaxation).  2. Right ventricular systolic function is  normal. The right ventricular size is normal. Tricuspid regurgitation signal is inadequate for assessing PA pressure.  3. The mitral valve is normal in structure. Trivial mitral valve regurgitation. No evidence of mitral stenosis.  4. The aortic valve is grossly normal. Aortic valve regurgitation is mild to moderate. Aortic valve sclerosis is present, with no evidence of aortic valve stenosis.  5. The inferior vena cava is normal in size with greater than 50% respiratory variability, suggesting right atrial pressure of 3 mmHg. FINDINGS  Left Ventricle: Left ventricular ejection fraction, by estimation, is 55%. The left ventricle has normal function. The left ventricle has no regional wall motion abnormalities. The left ventricular internal cavity size was normal in size. There is no left ventricular hypertrophy. Left ventricular diastolic parameters are consistent with Grade I diastolic dysfunction (impaired relaxation). Right Ventricle: The right ventricular size is normal. No increase in right ventricular wall thickness. Right ventricular systolic function is normal. Tricuspid regurgitation signal is inadequate for assessing PA pressure. The tricuspid regurgitant velocity is 1.34 m/s, and with an assumed right atrial pressure of 3 mmHg, the estimated right ventricular systolic pressure is 10.2 mmHg. Left Atrium: Left atrial size was normal in size. Right Atrium: Right atrial size was normal in size. Pericardium: There is no evidence of pericardial effusion. Mitral Valve: The mitral valve is normal in structure. Trivial mitral valve regurgitation. No evidence of mitral valve stenosis. Tricuspid Valve: The tricuspid valve is normal in structure. Tricuspid valve regurgitation is trivial. No evidence of tricuspid stenosis. Aortic Valve: The aortic valve is grossly normal. Aortic valve regurgitation is mild to moderate. Aortic regurgitation PHT measures 842 msec. Aortic valve sclerosis is present, with no evidence of  aortic valve stenosis. Pulmonic Valve: The pulmonic valve was not well visualized. Pulmonic valve regurgitation is not visualized. No evidence of pulmonic stenosis. Aorta: The aortic root is normal in size and structure. Ascending aorta measurements are within normal limits for age when indexed to body surface area. Venous: The inferior vena cava is normal in size with greater than 50% respiratory variability, suggesting right atrial pressure of 3 mmHg. IAS/Shunts: No atrial level shunt detected by color flow Doppler.  LEFT VENTRICLE PLAX 2D LVIDd:         5.40 cm      Diastology LVIDs:         4.10 cm      LV e' medial:    7.07 cm/s LV PW:         0.70 cm      LV E/e' medial:  7.1 LV IVS:        0.70 cm      LV e' lateral:   7.40 cm/s LVOT diam:     2.50 cm      LV E/e' lateral: 6.8 LV SV:         127 LV SV Index:   63 LVOT Area:     4.91 cm  LV Volumes (MOD) LV vol d, MOD A2C: 98.7 ml LV vol d, MOD A4C: 100.0 ml LV vol s, MOD A2C: 49.4 ml LV vol s, MOD A4C: 43.6 ml LV SV MOD A2C:     49.3 ml LV SV MOD A4C:     100.0 ml LV SV MOD BP:      54.7 ml RIGHT VENTRICLE             IVC RV Basal diam:  2.70 cm     IVC diam: 1.40 cm RV S prime:     20.00 cm/s TAPSE (M-mode): 2.2 cm LEFT ATRIUM  Index        RIGHT ATRIUM          Index LA diam:        2.90 cm 1.44 cm/m   RA Area:     7.43 cm LA Vol (A2C):   34.7 ml 17.21 ml/m  RA Volume:   9.46 ml  4.69 ml/m LA Vol (A4C):   31.4 ml 15.57 ml/m LA Biplane Vol: 30.9 ml 15.32 ml/m  AORTIC VALVE LVOT Vmax:   130.00 cm/s LVOT Vmean:  87.200 cm/s LVOT VTI:    0.259 m AI PHT:      842 msec  AORTA Ao Root diam: 3.60 cm Ao Asc diam:  3.80 cm MITRAL VALVE               TRICUSPID VALVE MV Area (PHT): 1.93 cm    TR Peak grad:   7.2 mmHg MV Decel Time: 394 msec    TR Vmax:        134.00 cm/s MV E velocity: 50.10 cm/s MV A velocity: 93.80 cm/s  SHUNTS MV E/A ratio:  0.53        Systemic VTI:  0.26 m                            Systemic Diam: 2.50 cm [name] MD  Electronically signed by [name] MD Signature Date/Time: 04/15/2023/3:27:38 PM    Final     Cardiac Studies   TTE 04/15/2023  1. Left ventricular ejection fraction, by estimation, is 55%. The left  ventricle has normal function. The left ventricle has no regional wall  motion abnormalities. Left ventricular diastolic parameters are consistent  with Grade I diastolic dysfunction  (impaired relaxation).   2. Right ventricular systolic function is normal. The right ventricular  size is normal. Tricuspid regurgitation signal is inadequate for assessing  PA pressure.   3. The mitral valve is normal in structure. Trivial mitral valve  regurgitation. No evidence of mitral stenosis.   4. The aortic valve is grossly normal. Aortic valve regurgitation is mild  to moderate. Aortic valve sclerosis is present, with no evidence of aortic  valve stenosis.   5. The inferior vena cava is normal in size with greater than 50%  respiratory variability, suggesting right atrial pressure of 3 mmHg.    Patient Profile     77 y.o. male DM and no prior cardiac history who is being seen 04/15/2023 for the evaluation of bradycardia at the request of Dr. Caleb Popp.  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  Has mild to moderate AI.  Otherwise is unremarkable -No significant electrolyte abnormalities -This rhythm is stable, there is no indication for temp pacer at this time -he was not on any AVN blocking agents at home -Mag 2.2, K+ 3.6, TSH low at .242 with mildly elevated T4 -EP consult pending  Acute PE -on heparin gtt/ AC per primary team -He has no signs of RV strain on echo -transition to DOAC once all procedures are completed  Dysphagia -Workup per GI -Discussed with [facility] and has an  obstruction from a Barium swallow and needs endoscopy -His rhythm is stable, however understand the concerns of anesthesia with an EGD with intermittent 2  1 AV block and sinus bradycardia     For questions or updates, please contact [facility] Please consult [email or web address] for contact info under        Signed, Armanda Magic, MD  04/17/2023, 10:31 AM

## 2023-04-17 NOTE — H&P (View-Only) (Signed)
[facility] Progress Note  SUBJECTIVE:   Interval history: Samuel [name] was seen and evaluated today at bedside. Friend at bedside. Noted that he has been experiencing new onset, progressive dysphagia to solids then liquids x 6 weeks. He denied abdominal pain. Has some shortness of breath and chest discomfort. Has constipation and dark stools.   Past Medical History:  Diagnosis Date   Diabetes mellitus without complication (HCC)    Past Surgical History:  Procedure Laterality Date   KNEE ARTHROPLASTY Bilateral    Current Facility-Administered Medications  Medication Dose Route Frequency Provider Last Rate Last Admin   0.45 % sodium chloride infusion   Intravenous Continuous Rhetta Mura, MD 40 mL/hr at 04/16/23 1817 New Bag at 04/16/23 1817   acetaminophen (TYLENOL) tablet 650 mg  650 mg Oral Q6H PRN Nolberto Hanlon, MD       Or   acetaminophen (TYLENOL) suppository 650 mg  650 mg Rectal Q6H PRN Nolberto Hanlon, MD       atropine 1 MG/10ML injection 1 mg  1 mg Intravenous PRN Rhetta Mura, MD   1 mg at 04/16/23 0929   heparin ADULT infusion 100 units/mL (25000 units/245mL)  750 Units/hr Intravenous Continuous Jacklynn Barnacle, RPH 7.5 mL/hr at 04/17/23 0700 750 Units/hr at 04/17/23 0700   influenza vaccine adjuvanted (FLUAD) injection 0.5 mL  0.5 mL Intramuscular Tomorrow-1000 Narda Bonds, MD       insulin aspart (novoLOG) injection 0-15 Units  0-15 Units Subcutaneous TID WC Nolberto Hanlon, MD       pantoprazole (PROTONIX) injection 40 mg  40 mg Intravenous QHS Nolberto Hanlon, MD   40 mg at 04/16/23 2216   piperacillin-tazobactam (ZOSYN) IVPB 3.375 g  3.375 g Intravenous Q8H Samtani, Jai-Gurmukh, MD 12.5 mL/hr at 04/17/23 0700 Infusion Verify at 04/17/23 0700   polyethylene glycol (MIRALAX / GLYCOLAX) packet 17 g  17 g Oral Daily PRN Nolberto Hanlon, MD       Allergies as of 04/14/2023 - Review Complete 04/14/2023  Allergen Reaction Noted   Penicillins  09/18/2011   Sulfa  antibiotics  09/18/2011   Procaine Other (See Comments) 06/15/2015   Review of Systems:  Review of Systems  Respiratory:  Positive for shortness of breath.   Cardiovascular:  Positive for chest pain.  Gastrointestinal:  Positive for constipation. Negative for abdominal pain, nausea and vomiting.       Dysphagia. Regurgitation.    OBJECTIVE:   Temp:  [98 F (36.7 C)-98.4 F (36.9 C)] 98.2 F (36.8 C) (10/14 1131) Pulse Rate:  [36-74] 54 (10/14 1131) Resp:  [12-20] 19 (10/14 1131) BP: (118-145)/(54-69) 126/54 (10/14 1131) SpO2:  [94 %-98 %] 95 % (10/14 1131) Last BM Date : 04/11/23 Physical Exam Constitutional:      General: He is not in acute distress.    Appearance: He is not ill-appearing, toxic-appearing or diaphoretic.  Cardiovascular:     Rate and Rhythm: Regular rhythm. Bradycardia present.  Pulmonary:     Effort: No respiratory distress.     Breath sounds: Normal breath sounds.  Abdominal:     General: Bowel sounds are normal. There is no distension.     Palpations: Abdomen is soft.     Tenderness: There is no abdominal tenderness. There is no guarding.  Musculoskeletal:     Right lower leg: No edema.     Left lower leg: No edema.  Skin:    General: Skin is warm and dry.  Neurological:     Mental Status: He  is alert.     Labs: Recent Labs    04/15/23 0302 04/16/23 0605 04/17/23 0404  WBC 11.0* 9.9 10.4  HGB 15.2 15.3 15.4  HCT 45.5 45.6 46.0  PLT 164 153 149*   BMET Recent Labs    04/15/23 0302 04/16/23 0101 04/16/23 0605  NA 138 140 139  K 3.5 3.6 3.6  CL 106 107 106  CO2 23 23 23   GLUCOSE 82 82 78  BUN 20 13 13   CREATININE 0.77 0.83 0.89  CALCIUM 8.1* 8.5* 8.3*   LFT Recent Labs    04/15/23 0302 04/16/23 0605  PROT 5.2* 4.7*  ALBUMIN 2.9* 2.5*  AST 55* 50*  ALT 66* 58*  ALKPHOS 48 42  BILITOT 1.9* 1.5*  BILIDIR 0.6*  --   IBILI 1.3*  --    PT/INR Recent Labs    04/15/23 0302  LABPROT 18.3*  INR 1.5*   Diagnostic  imaging: DG ESOPHAGUS W SINGLE CM (SOL OR THIN BA)  Result Date: 04/17/2023 CLINICAL DATA:  77 year old male with trouble swallowing both liquid and solid food. EXAM: ESOPHAGUS/BARIUM SWALLOW/TABLET STUDY TECHNIQUE: Single contrast examination was performed using thin liquid barium. This exam was performed by Loyce Dys PA-C, and was supervised and interpreted by Roanna Banning, MD. FLUOROSCOPY: Radiation Exposure Index (as provided by the fluoroscopic device): 13 mGy Kerma COMPARISON:  CT CHEST 04/14/2023. FINDINGS: Swallowing: Appears normal. No vestibular penetration or aspiration seen. Pharynx:  No masses, lesions or ulcers observed. Esophagus: Abrupt tapering in the thoracic esophagus with only a trickle of barium observed to intermittently pass into the stomach. Unable to assess for motility, hiatal hernia, and gastroesophageal reflux. 13mm barium tablet was not given. 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 Correlate with direct endoscopic visualization. Electronically Signed   By: Roanna Banning M.D.   On: 04/17/2023 10:24   ECHOCARDIOGRAM COMPLETE  Result Date: 04/15/2023    ECHOCARDIOGRAM REPORT   Patient Name:   Samuel [name] Date of Exam: 04/15/2023 Medical Rec #:  [id number]   Height:       66.0 in Accession #:    [id number]  Weight:       203.9 lb Date of Birth:  Feb 21, 1946   BSA:          2.016 m Patient Age:    77 years    BP:           127/67 mmHg Patient Gender: M           HR:           61 bpm. Exam Location:  Inpatient Procedure: 2D Echo, Cardiac Doppler and Color Doppler Indications:    Pulmonary embolus  History:        Patient has no prior history of Echocardiogram examinations.                 Arrythmias:Bradycardia; Risk Factors:Diabetes and Former Smoker.  Sonographer:    Milda Smart Referring Phys: [id number] [facility] GOEL  Sonographer Comments: Image acquisition challenging due to respiratory  motion. IMPRESSIONS  1. Left ventricular ejection fraction, by estimation, is 55%. The left ventricle has normal function. The left ventricle has no regional wall motion abnormalities. Left ventricular diastolic parameters are consistent with Grade I diastolic dysfunction (impaired relaxation).  2. Right ventricular systolic function is normal. The right ventricular size is normal. Tricuspid regurgitation signal is inadequate for assessing PA pressure.  3. The mitral valve is  normal in structure. Trivial mitral valve regurgitation. No evidence of mitral stenosis.  4. The aortic valve is grossly normal. Aortic valve regurgitation is mild to moderate. Aortic valve sclerosis is present, with no evidence of aortic valve stenosis.  5. The inferior vena cava is normal in size with greater than 50% respiratory variability, suggesting right atrial pressure of 3 mmHg. FINDINGS  Left Ventricle: Left ventricular ejection fraction, by estimation, is 55%. The left ventricle has normal function. The left ventricle has no regional wall motion abnormalities. The left ventricular internal cavity size was normal in size. There is no left ventricular hypertrophy. Left ventricular diastolic parameters are consistent with Grade I diastolic dysfunction (impaired relaxation). Right Ventricle: The right ventricular size is normal. No increase in right ventricular wall thickness. Right ventricular systolic function is normal. Tricuspid regurgitation signal is inadequate for assessing PA pressure. The tricuspid regurgitant velocity is 1.34 m/s, and with an assumed right atrial pressure of 3 mmHg, the estimated right ventricular systolic pressure is 10.2 mmHg. Left Atrium: Left atrial size was normal in size. Right Atrium: Right atrial size was normal in size. Pericardium: There is no evidence of pericardial effusion. Mitral Valve: The mitral valve is normal in structure. Trivial mitral valve regurgitation. No evidence of mitral valve  stenosis. Tricuspid Valve: The tricuspid valve is normal in structure. Tricuspid valve regurgitation is trivial. No evidence of tricuspid stenosis. Aortic Valve: The aortic valve is grossly normal. Aortic valve regurgitation is mild to moderate. Aortic regurgitation PHT measures 842 msec. Aortic valve sclerosis is present, with no evidence of aortic valve stenosis. Pulmonic Valve: The pulmonic valve was not well visualized. Pulmonic valve regurgitation is not visualized. No evidence of pulmonic stenosis. Aorta: The aortic root is normal in size and structure. Ascending aorta measurements are within normal limits for age when indexed to body surface area. Venous: The inferior vena cava is normal in size with greater than 50% respiratory variability, suggesting right atrial pressure of 3 mmHg. IAS/Shunts: No atrial level shunt detected by color flow Doppler.  LEFT VENTRICLE PLAX 2D LVIDd:         5.40 cm      Diastology LVIDs:         4.10 cm      LV e' medial:    7.07 cm/s LV PW:         0.70 cm      LV E/e' medial:  7.1 LV IVS:        0.70 cm      LV e' lateral:   7.40 cm/s LVOT diam:     2.50 cm      LV E/e' lateral: 6.8 LV SV:         127 LV SV Index:   63 LVOT Area:     4.91 cm  LV Volumes (MOD) LV vol d, MOD A2C: 98.7 ml LV vol d, MOD A4C: 100.0 ml LV vol s, MOD A2C: 49.4 ml LV vol s, MOD A4C: 43.6 ml LV SV MOD A2C:     49.3 ml LV SV MOD A4C:     100.0 ml LV SV MOD BP:      54.7 ml RIGHT VENTRICLE             IVC RV Basal diam:  2.70 cm     IVC diam: 1.40 cm RV S prime:     20.00 cm/s TAPSE (M-mode): 2.2 cm LEFT ATRIUM  Index        RIGHT ATRIUM          Index LA diam:        2.90 cm 1.44 cm/m   RA Area:     7.43 cm LA Vol (A2C):   34.7 ml 17.21 ml/m  RA Volume:   9.46 ml  4.69 ml/m LA Vol (A4C):   31.4 ml 15.57 ml/m LA Biplane Vol: 30.9 ml 15.32 ml/m  AORTIC VALVE LVOT Vmax:   130.00 cm/s LVOT Vmean:  87.200 cm/s LVOT VTI:    0.259 m AI PHT:      842 msec  AORTA Ao Root diam: 3.60 cm Ao Asc  diam:  3.80 cm MITRAL VALVE               TRICUSPID VALVE MV Area (PHT): 1.93 cm    TR Peak grad:   7.2 mmHg MV Decel Time: 394 msec    TR Vmax:        134.00 cm/s MV E velocity: 50.10 cm/s MV A velocity: 93.80 cm/s  SHUNTS MV E/A ratio:  0.53        Systemic VTI:  0.26 m                            Systemic Diam: 2.50 cm [name] MD Electronically signed by [name] MD Signature Date/Time: 04/15/2023/3:27:38 PM    Final     IMPRESSION: Abnormal barium swallow exam, long-segment distal esophagus stenosis Progressive dysphagia, no previous EGD or colonoscopy Pulmonary emboli, on heparin  Symptomatic bradycardia Diabetes mellitus  PLAN: -Recommend EGD to further evaluate dysphagia and abnormal barium swallow, discussed procedure with patient including benefits, alternatives and risks of bleeding/infection/perforation/anesthesia risk with bradycardia, he verbalized understanding and elected to proceed -Cardiology evaluation appreciated, recommendations to complete endoscopic workup before DOAC and pacemaker placement (if warranted) -Maintain NPO for now -Hold heparin 6 hours prior to EGD, will place order once endoscopy timing known -Further recommendations to follow pending procedure   LOS: 3 days   Liliane Shi, DO [facility]

## 2023-04-17 NOTE — Evaluation (Signed)
Physical Therapy Evaluation Patient Details Name: Samuel [name] MRN: [id number] DOB: 1945-10-06 Today's Date: 04/17/2023  History of Present Illness  Patient is a 77 y/o male admitted 04/14/23 with SOB and dysphagia.  Found to have PE, asymptomatic bradycardia/second degree heart block considering PPM, and esophageal stenosis with plan for EGD.  PMH positive for DM, B knee arthroplasty.  Clinical Impression  Patient presents with decreased mobility due to generalized weakness, decreased balance and decreased activity tolerance.  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  He was able to ambulate in the hallway with cane and CGA to S and negotiated 3 steps with rails and CGA.  Noted HR maintained 60-80's with a-fib showing at times, SpO2 on RA 91% or greater and BP stable with initial light headed symptoms resolving during ambulation.  Feel he will benefit from skilled PT in the acute setting and anticipate return home without follow up PT.          If plan is discharge home, recommend the following: A little help with walking and/or transfers;Help with stairs or ramp for entrance;Assistance with cooking/housework;Assist for transportation   Can travel by private vehicle        Equipment Recommendations None recommended by PT  Recommendations for Other Services       Functional Status Assessment Patient has had a recent decline in their functional status and demonstrates the ability to make significant improvements in function in a reasonable and predictable amount of time.     Precautions / Restrictions Precautions Precautions: Fall      Mobility  Bed Mobility Overal bed mobility: Needs Assistance Bed Mobility: Supine to Sit, Sit to Supine     Supine to sit: Supervision Sit to supine: Contact guard assist   General bed mobility comments: assist for lifting trunk with pt pulling up with rail; also for  lines and to supine assist for positioning/safety with cues    Transfers Overall transfer level: Needs assistance Equipment used: Straight cane Transfers: Sit to/from Stand Sit to Stand: Contact guard assist, Min assist           General transfer comment: patient asking for HHA to stand and utilized cane    Ambulation/Gait Ambulation/Gait assistance: Contact guard assist, Supervision Gait Distance (Feet): 200 Feet Assistive device: Straight cane Gait Pattern/deviations: Step-through pattern, Decreased stride length, Trunk flexed       General Gait Details: mildly flexed and using cane throughout; assist for IV pole and telemetry; reported having to look down initially to avoid feeling lightheaded, then able to look up more  Stairs Stairs: Yes Stairs assistance: Contact guard assist, Supervision Stair Management: Two rails, Step to pattern, Forwards Number of Stairs: 3 General stair comments: assist for safety when turning around on steps  Wheelchair Mobility     Tilt Bed    Modified Rankin (Stroke Patients Only)       Balance Overall balance assessment: Needs assistance   Sitting balance-Leahy Scale: Good       Standing balance-Leahy Scale: Fair Standing balance comment: can stand static no UE support; using cane for dynamic mobility                             Pertinent Vitals/Pain Pain Assessment Pain Assessment: 0-10 Pain Score: 9  Pain Location: throat cramp Pain Intervention(s): Monitored during session    Home Living Family/patient expects to be discharged  to:: Private residence Living Arrangements: Non-relatives/Friends Available Help at Discharge: Family Type of Home: House Home Access: Stairs to enter Entrance Stairs-Rails: Doctor, general practice of Steps: 5 Alternate Level Stairs-Number of Steps: flight Home Layout: Two level Home Equipment: Tub bench;Grab bars - tub/shower;Hand held shower head;Cane - single  point;Rolling Walker (2 wheels)      Prior Function Prior Level of Function : Independent/Modified Independent             Mobility Comments: 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  He helps with their daily ADL's and chores, though they have other help from [facility] and go to programs most days.  Patient's wife here from [facility] as well though they sleep separate.  Patient was sleeping upstairs, but plans for his wife to move up there and he can stay on the main level       Extremity/Trunk Assessment   Upper Extremity Assessment Upper Extremity Assessment: Generalized weakness    Lower Extremity Assessment Lower Extremity Assessment: Generalized weakness       Communication   Communication Communication: No apparent difficulties  Cognition Arousal: Alert Behavior During Therapy: WFL for tasks assessed/performed Overall Cognitive Status: Within Functional Limits for tasks assessed                                          General Comments General comments (skin integrity, edema, etc.): HR ranging 50-80's throughout ambulation with SpO2 91% or greater on RA and BP stable after ambulation; patient reported dizziness initially though less with ambulation and stair negotiation    Exercises     Assessment/Plan    PT Assessment Patient needs continued PT services  PT Problem List Decreased strength;Decreased balance;Decreased mobility;Decreased activity tolerance;Decreased knowledge of use of DME       PT Treatment Interventions DME instruction;Stair training;Gait training;Patient/family education;Functional mobility training;Therapeutic activities    PT Goals (Current goals can be found in the Care Plan section)  Acute Rehab PT Goals Patient Stated Goal: Return home and back to work PT Goal Formulation: With patient Time For Goal Achievement: 05/01/23 Potential to  Achieve Goals: Good    Frequency Min 1X/week     Co-evaluation               AM-PAC PT "6 Clicks" Mobility  Outcome Measure Help needed turning from your back to your side while in a flat bed without using bedrails?: A Little Help needed moving from lying on your back to sitting on the side of a flat bed without using bedrails?: A Little Help needed moving to and from a bed to a chair (including a wheelchair)?: A Little Help needed standing up from a chair using your arms (e.g., wheelchair or bedside chair)?: A Little Help needed to walk in hospital room?: A Little Help needed climbing 3-5 steps with a railing? : A Little 6 Click Score: 18    End of Session Equipment Utilized During Treatment: Gait belt Activity Tolerance: Patient tolerated treatment well Patient left: in bed;with call bell/phone within reach;with family/visitor present;with bed alarm set   PT Visit Diagnosis: Other abnormalities of gait and mobility (R26.89);Muscle weakness (generalized) (M62.81)    Time: 4696-2952 PT Time Calculation (min) (ACUTE ONLY): 32 min   Charges:   PT Evaluation $PT Eval Moderate Complexity: 1 Mod PT  Treatments $Gait Training: 8-22 mins PT General Charges $$ ACUTE PT VISIT: 1 Visit         Sheran Lawless, PT Acute Rehabilitation Services Office:[phone] 04/17/2023   Elray Mcgregor 04/17/2023, 5:29 PM

## 2023-04-17 NOTE — Plan of Care (Signed)
Poc is progressing.

## 2023-04-17 NOTE — Progress Notes (Signed)
PHARMACY - ANTICOAGULATION CONSULT NOTE  Pharmacy Consult for heparin Indication: acute pulmonary embolus  Allergies  Allergen Reactions   Penicillins     Shortness of breath   Sulfa Antibiotics     Blisters    Procaine Other (See Comments)    Caused fatigue    Patient Measurements: Height: 5\' 6"  (167.6 cm) Weight: 92.5 kg (203 lb 14.8 oz) IBW/kg (Calculated) : 63.8 Heparin Dosing Weight: 84 kg  Vital Signs: Temp: 98.1 F (36.7 C) (10/14 0313) Temp Source: Oral (10/14 0313) BP: 137/67 (10/14 0313) Pulse Rate: 50 (10/14 0313)  Labs: Recent Labs    04/14/23 1416 04/14/23 1752 04/15/23 0300 04/15/23 0302 04/15/23 1247 04/16/23 0101 04/16/23 0605 04/16/23 1314 04/16/23 2306 04/17/23 0404  HGB 18.2*  --   --  15.2  --   --  15.3  --   --  15.4  HCT 53.8*  --   --  45.5  --   --  45.6  --   --  46.0  PLT 211  --   --  164  --   --  153  --   --  149*  APTT  --   --   --  200*  --   --   --   --   --   --   LABPROT  --   --   --  18.3*  --   --   --   --   --   --   INR  --   --   --  1.5*  --   --   --   --   --   --   HEPARINUNFRC  --   --    < >  --    < >  --  0.87* 0.71* 0.65 0.48  CREATININE 0.93  --   --  0.77  --  0.83 0.89  --   --   --   TROPONINIHS 56* 67*  --   --   --  67*  --   --   --   --    < > = values in this interval not displayed.    Estimated Creatinine Clearance: 74 mL/min (by C-G formula based on SCr of 0.89 mg/dL).   Medical History: Past Medical History:  Diagnosis Date   Diabetes mellitus without complication [facility])     Assessment: Patient is a 77 y.o M who presented to the ED on 04/14/23 with c/o CP and difficulty swallowing. Chest CT showed acute PE in the right main pulmonary artery extending into the upper and lower lobes with no evidence of RHS. Pharmacy has been consulted to dose heparin for VTE treatment. He is not on anticoagulation PTA.  Baseline labs WNL.  Heparin level remains therapeutic with AM labs on 750  units/hr  Goal of Therapy:  Heparin level 0.3-0.7 units/ml Monitor platelets by anticoagulation protocol: Yes   Plan:  Continue heparin gtt at 750 units/hr Daily heparin level, CBC, s/s bleeding F/u long term [facility] plan  Daylene Posey, PharmD, [facility] Clinical Pharmacist ED Pharmacist Phone # [phone] 04/17/2023 7:13 AM

## 2023-04-17 NOTE — Progress Notes (Signed)
[facility] Progress Note  SUBJECTIVE:   Interval history: Samuel [name] was seen and evaluated today at bedside. Friend at bedside. Noted that he has been experiencing new onset, progressive dysphagia to solids then liquids x 6 weeks. He denied abdominal pain. Has some shortness of breath and chest discomfort. Has constipation and dark stools.   Past Medical History:  Diagnosis Date   Diabetes mellitus without complication (HCC)    Past Surgical History:  Procedure Laterality Date   KNEE ARTHROPLASTY Bilateral    Current Facility-Administered Medications  Medication Dose Route Frequency Provider Last Rate Last Admin   0.45 % sodium chloride infusion   Intravenous Continuous Rhetta Mura, MD 40 mL/hr at 04/16/23 1817 New Bag at 04/16/23 1817   acetaminophen (TYLENOL) tablet 650 mg  650 mg Oral Q6H PRN Nolberto Hanlon, MD       Or   acetaminophen (TYLENOL) suppository 650 mg  650 mg Rectal Q6H PRN Nolberto Hanlon, MD       atropine 1 MG/10ML injection 1 mg  1 mg Intravenous PRN Rhetta Mura, MD   1 mg at 04/16/23 0929   heparin ADULT infusion 100 units/mL (25000 units/245mL)  750 Units/hr Intravenous Continuous Jacklynn Barnacle, RPH 7.5 mL/hr at 04/17/23 0700 750 Units/hr at 04/17/23 0700   influenza vaccine adjuvanted (FLUAD) injection 0.5 mL  0.5 mL Intramuscular Tomorrow-1000 Narda Bonds, MD       insulin aspart (novoLOG) injection 0-15 Units  0-15 Units Subcutaneous TID WC Nolberto Hanlon, MD       pantoprazole (PROTONIX) injection 40 mg  40 mg Intravenous QHS Nolberto Hanlon, MD   40 mg at 04/16/23 2216   piperacillin-tazobactam (ZOSYN) IVPB 3.375 g  3.375 g Intravenous Q8H Samtani, Jai-Gurmukh, MD 12.5 mL/hr at 04/17/23 0700 Infusion Verify at 04/17/23 0700   polyethylene glycol (MIRALAX / GLYCOLAX) packet 17 g  17 g Oral Daily PRN Nolberto Hanlon, MD       Allergies as of 04/14/2023 - Review Complete 04/14/2023  Allergen Reaction Noted   Penicillins  09/18/2011   Sulfa  antibiotics  09/18/2011   Procaine Other (See Comments) 06/15/2015   Review of Systems:  Review of Systems  Respiratory:  Positive for shortness of breath.   Cardiovascular:  Positive for chest pain.  Gastrointestinal:  Positive for constipation. Negative for abdominal pain, nausea and vomiting.       Dysphagia. Regurgitation.    OBJECTIVE:   Temp:  [98 F (36.7 C)-98.4 F (36.9 C)] 98.2 F (36.8 C) (10/14 1131) Pulse Rate:  [36-74] 54 (10/14 1131) Resp:  [12-20] 19 (10/14 1131) BP: (118-145)/(54-69) 126/54 (10/14 1131) SpO2:  [94 %-98 %] 95 % (10/14 1131) Last BM Date : 04/11/23 Physical Exam Constitutional:      General: He is not in acute distress.    Appearance: He is not ill-appearing, toxic-appearing or diaphoretic.  Cardiovascular:     Rate and Rhythm: Regular rhythm. Bradycardia present.  Pulmonary:     Effort: No respiratory distress.     Breath sounds: Normal breath sounds.  Abdominal:     General: Bowel sounds are normal. There is no distension.     Palpations: Abdomen is soft.     Tenderness: There is no abdominal tenderness. There is no guarding.  Musculoskeletal:     Right lower leg: No edema.     Left lower leg: No edema.  Skin:    General: Skin is warm and dry.  Neurological:     Mental Status: He  is alert.     Labs: Recent Labs    04/15/23 0302 04/16/23 0605 04/17/23 0404  WBC 11.0* 9.9 10.4  HGB 15.2 15.3 15.4  HCT 45.5 45.6 46.0  PLT 164 153 149*   BMET Recent Labs    04/15/23 0302 04/16/23 0101 04/16/23 0605  NA 138 140 139  K 3.5 3.6 3.6  CL 106 107 106  CO2 23 23 23   GLUCOSE 82 82 78  BUN 20 13 13   CREATININE 0.77 0.83 0.89  CALCIUM 8.1* 8.5* 8.3*   LFT Recent Labs    04/15/23 0302 04/16/23 0605  PROT 5.2* 4.7*  ALBUMIN 2.9* 2.5*  AST 55* 50*  ALT 66* 58*  ALKPHOS 48 42  BILITOT 1.9* 1.5*  BILIDIR 0.6*  --   IBILI 1.3*  --    PT/INR Recent Labs    04/15/23 0302  LABPROT 18.3*  INR 1.5*   Diagnostic  imaging: DG ESOPHAGUS W SINGLE CM (SOL OR THIN BA)  Result Date: 04/17/2023 CLINICAL DATA:  77 year old male with trouble swallowing both liquid and solid food. EXAM: ESOPHAGUS/BARIUM SWALLOW/TABLET STUDY TECHNIQUE: Single contrast examination was performed using thin liquid barium. This exam was performed by Loyce Dys PA-C, and was supervised and interpreted by Roanna Banning, MD. FLUOROSCOPY: Radiation Exposure Index (as provided by the fluoroscopic device): 13 mGy Kerma COMPARISON:  CT CHEST 04/14/2023. FINDINGS: Swallowing: Appears normal. No vestibular penetration or aspiration seen. Pharynx:  No masses, lesions or ulcers observed. Esophagus: Abrupt tapering in the thoracic esophagus with only a trickle of barium observed to intermittently pass into the stomach. Unable to assess for motility, hiatal hernia, and gastroesophageal reflux. 13mm barium tablet was not given. 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 Correlate with direct endoscopic visualization. Electronically Signed   By: Roanna Banning M.D.   On: 04/17/2023 10:24   ECHOCARDIOGRAM COMPLETE  Result Date: 04/15/2023    ECHOCARDIOGRAM REPORT   Patient Name:   Samuel [name] Date of Exam: 04/15/2023 Medical Rec #:  [id number]   Height:       66.0 in Accession #:    [id number]  Weight:       203.9 lb Date of Birth:  Feb 21, 1946   BSA:          2.016 m Patient Age:    77 years    BP:           127/67 mmHg Patient Gender: M           HR:           61 bpm. Exam Location:  Inpatient Procedure: 2D Echo, Cardiac Doppler and Color Doppler Indications:    Pulmonary embolus  History:        Patient has no prior history of Echocardiogram examinations.                 Arrythmias:Bradycardia; Risk Factors:Diabetes and Former Smoker.  Sonographer:    Milda Smart Referring Phys: [id number] [facility] GOEL  Sonographer Comments: Image acquisition challenging due to respiratory  motion. IMPRESSIONS  1. Left ventricular ejection fraction, by estimation, is 55%. The left ventricle has normal function. The left ventricle has no regional wall motion abnormalities. Left ventricular diastolic parameters are consistent with Grade I diastolic dysfunction (impaired relaxation).  2. Right ventricular systolic function is normal. The right ventricular size is normal. Tricuspid regurgitation signal is inadequate for assessing PA pressure.  3. The mitral valve is  normal in structure. Trivial mitral valve regurgitation. No evidence of mitral stenosis.  4. The aortic valve is grossly normal. Aortic valve regurgitation is mild to moderate. Aortic valve sclerosis is present, with no evidence of aortic valve stenosis.  5. The inferior vena cava is normal in size with greater than 50% respiratory variability, suggesting right atrial pressure of 3 mmHg. FINDINGS  Left Ventricle: Left ventricular ejection fraction, by estimation, is 55%. The left ventricle has normal function. The left ventricle has no regional wall motion abnormalities. The left ventricular internal cavity size was normal in size. There is no left ventricular hypertrophy. Left ventricular diastolic parameters are consistent with Grade I diastolic dysfunction (impaired relaxation). Right Ventricle: The right ventricular size is normal. No increase in right ventricular wall thickness. Right ventricular systolic function is normal. Tricuspid regurgitation signal is inadequate for assessing PA pressure. The tricuspid regurgitant velocity is 1.34 m/s, and with an assumed right atrial pressure of 3 mmHg, the estimated right ventricular systolic pressure is 10.2 mmHg. Left Atrium: Left atrial size was normal in size. Right Atrium: Right atrial size was normal in size. Pericardium: There is no evidence of pericardial effusion. Mitral Valve: The mitral valve is normal in structure. Trivial mitral valve regurgitation. No evidence of mitral valve  stenosis. Tricuspid Valve: The tricuspid valve is normal in structure. Tricuspid valve regurgitation is trivial. No evidence of tricuspid stenosis. Aortic Valve: The aortic valve is grossly normal. Aortic valve regurgitation is mild to moderate. Aortic regurgitation PHT measures 842 msec. Aortic valve sclerosis is present, with no evidence of aortic valve stenosis. Pulmonic Valve: The pulmonic valve was not well visualized. Pulmonic valve regurgitation is not visualized. No evidence of pulmonic stenosis. Aorta: The aortic root is normal in size and structure. Ascending aorta measurements are within normal limits for age when indexed to body surface area. Venous: The inferior vena cava is normal in size with greater than 50% respiratory variability, suggesting right atrial pressure of 3 mmHg. IAS/Shunts: No atrial level shunt detected by color flow Doppler.  LEFT VENTRICLE PLAX 2D LVIDd:         5.40 cm      Diastology LVIDs:         4.10 cm      LV e' medial:    7.07 cm/s LV PW:         0.70 cm      LV E/e' medial:  7.1 LV IVS:        0.70 cm      LV e' lateral:   7.40 cm/s LVOT diam:     2.50 cm      LV E/e' lateral: 6.8 LV SV:         127 LV SV Index:   63 LVOT Area:     4.91 cm  LV Volumes (MOD) LV vol d, MOD A2C: 98.7 ml LV vol d, MOD A4C: 100.0 ml LV vol s, MOD A2C: 49.4 ml LV vol s, MOD A4C: 43.6 ml LV SV MOD A2C:     49.3 ml LV SV MOD A4C:     100.0 ml LV SV MOD BP:      54.7 ml RIGHT VENTRICLE             IVC RV Basal diam:  2.70 cm     IVC diam: 1.40 cm RV S prime:     20.00 cm/s TAPSE (M-mode): 2.2 cm LEFT ATRIUM  Index        RIGHT ATRIUM          Index LA diam:        2.90 cm 1.44 cm/m   RA Area:     7.43 cm LA Vol (A2C):   34.7 ml 17.21 ml/m  RA Volume:   9.46 ml  4.69 ml/m LA Vol (A4C):   31.4 ml 15.57 ml/m LA Biplane Vol: 30.9 ml 15.32 ml/m  AORTIC VALVE LVOT Vmax:   130.00 cm/s LVOT Vmean:  87.200 cm/s LVOT VTI:    0.259 m AI PHT:      842 msec  AORTA Ao Root diam: 3.60 cm Ao Asc  diam:  3.80 cm MITRAL VALVE               TRICUSPID VALVE MV Area (PHT): 1.93 cm    TR Peak grad:   7.2 mmHg MV Decel Time: 394 msec    TR Vmax:        134.00 cm/s MV E velocity: 50.10 cm/s MV A velocity: 93.80 cm/s  SHUNTS MV E/A ratio:  0.53        Systemic VTI:  0.26 m                            Systemic Diam: 2.50 cm [name] MD Electronically signed by [name] MD Signature Date/Time: 04/15/2023/3:27:38 PM    Final     IMPRESSION: Abnormal barium swallow exam, long-segment distal esophagus stenosis Progressive dysphagia, no previous EGD or colonoscopy Pulmonary emboli, on heparin  Symptomatic bradycardia Diabetes mellitus  PLAN: -Recommend EGD to further evaluate dysphagia and abnormal barium swallow, discussed procedure with patient including benefits, alternatives and risks of bleeding/infection/perforation/anesthesia risk with bradycardia, he verbalized understanding and elected to proceed -Cardiology evaluation appreciated, recommendations to complete endoscopic workup before DOAC and pacemaker placement (if warranted) -Maintain NPO for now -Hold heparin 6 hours prior to EGD, will place order once endoscopy timing known -Further recommendations to follow pending procedure   LOS: 3 days   Liliane Shi, DO [facility]

## 2023-04-17 NOTE — Plan of Care (Signed)
  Problem: Education: Goal: Ability to describe self-care measures that may prevent or decrease complications (Diabetes Survival Skills Education) will improve Outcome: Progressing Goal: Individualized Educational Video(s) Outcome: Progressing   Problem: Coping: Goal: Ability to adjust to condition or change in health will improve Outcome: Not Progressing   Problem: Fluid Volume: Goal: Ability to maintain a balanced intake and output will improve Outcome: Not Progressing   Problem: Health Behavior/Discharge Planning: Goal: Ability to identify and utilize available resources and services will improve Outcome: Progressing Goal: Ability to manage health-related needs will improve Outcome: Progressing   Problem: Metabolic: Goal: Ability to maintain appropriate glucose levels will improve Outcome: Progressing   Problem: Nutritional: Goal: Maintenance of adequate nutrition will improve Outcome: Not Progressing Goal: Progress toward achieving an optimal weight will improve Outcome: Progressing   Problem: Skin Integrity: Goal: Risk for impaired skin integrity will decrease Outcome: Progressing   Problem: Tissue Perfusion: Goal: Adequacy of tissue perfusion will improve Outcome: Progressing   Problem: Education: Goal: Knowledge of General Education information will improve Description: Including pain rating scale, medication(s)/side effects and non-pharmacologic comfort measures Outcome: Progressing   Problem: Health Behavior/Discharge Planning: Goal: Ability to manage health-related needs will improve Outcome: Progressing   Problem: Clinical Measurements: Goal: Ability to maintain clinical measurements within normal limits will improve Outcome: Progressing Goal: Will remain free from infection Outcome: Progressing Goal: Diagnostic test results will improve Outcome: Progressing Goal: Respiratory complications will improve Outcome: Progressing Goal: Cardiovascular  complication will be avoided Outcome: Progressing   Problem: Activity: Goal: Risk for activity intolerance will decrease Outcome: Progressing   Problem: Nutrition: Goal: Adequate nutrition will be maintained Outcome: Not Progressing   Problem: Coping: Goal: Level of anxiety will decrease Outcome: Progressing   Problem: Elimination: Goal: Will not experience complications related to bowel motility Outcome: Progressing Goal: Will not experience complications related to urinary retention Outcome: Progressing   Problem: Pain Managment: Goal: General experience of comfort will improve Outcome: Not Progressing   Problem: Safety: Goal: Ability to remain free from injury will improve Outcome: Progressing   Problem: Skin Integrity: Goal: Risk for impaired skin integrity will decrease Outcome: Progressing

## 2023-04-18 ENCOUNTER — Encounter (HOSPITAL_COMMUNITY)
Admission: EM | Disposition: A | Discharge status: Home / Self Care | Payer: Self-pay | Source: Home / Self Care | Attending: Internal Medicine

## 2023-04-18 ENCOUNTER — Other Ambulatory Visit (HOSPITAL_COMMUNITY): Payer: Self-pay

## 2023-04-18 ENCOUNTER — Encounter (HOSPITAL_COMMUNITY): Payer: Self-pay | Admitting: Internal Medicine

## 2023-04-18 ENCOUNTER — Other Ambulatory Visit: Payer: Self-pay

## 2023-04-18 ENCOUNTER — Inpatient Hospital Stay (HOSPITAL_COMMUNITY): Payer: 59 | Admitting: Anesthesiology

## 2023-04-18 DIAGNOSIS — C159 Malignant neoplasm of esophagus, unspecified: Secondary | ICD-10-CM | POA: Diagnosis not present

## 2023-04-18 DIAGNOSIS — E43 Unspecified severe protein-calorie malnutrition: Secondary | ICD-10-CM | POA: Diagnosis not present

## 2023-04-18 DIAGNOSIS — I2699 Other pulmonary embolism without acute cor pulmonale: Secondary | ICD-10-CM | POA: Diagnosis not present

## 2023-04-18 DIAGNOSIS — R001 Bradycardia, unspecified: Secondary | ICD-10-CM

## 2023-04-18 DIAGNOSIS — R1310 Dysphagia, unspecified: Secondary | ICD-10-CM

## 2023-04-18 DIAGNOSIS — I10 Essential (primary) hypertension: Secondary | ICD-10-CM

## 2023-04-18 DIAGNOSIS — R7303 Prediabetes: Secondary | ICD-10-CM

## 2023-04-18 DIAGNOSIS — R7401 Elevation of levels of liver transaminase levels: Secondary | ICD-10-CM

## 2023-04-18 DIAGNOSIS — K222 Esophageal obstruction: Secondary | ICD-10-CM

## 2023-04-18 HISTORY — PX: BIOPSY: SHX5522

## 2023-04-18 HISTORY — PX: ESOPHAGOGASTRODUODENOSCOPY: SHX5428

## 2023-04-18 LAB — COMPREHENSIVE METABOLIC PANEL
ALT: 53 U/L — ABNORMAL HIGH (ref 0–44)
AST: 46 U/L — ABNORMAL HIGH (ref 15–41)
Albumin: 2.5 g/dL — ABNORMAL LOW (ref 3.5–5.0)
Alkaline Phosphatase: 48 U/L (ref 38–126)
Anion gap: 11 (ref 5–15)
BUN: 13 mg/dL (ref 8–23)
CO2: 22 mmol/L (ref 22–32)
Calcium: 8.4 mg/dL — ABNORMAL LOW (ref 8.9–10.3)
Chloride: 108 mmol/L (ref 98–111)
Creatinine, Ser: 1.09 mg/dL (ref 0.61–1.24)
GFR, Estimated: >60 mL/min (ref >60)
Glucose, Bld: 83 mg/dL (ref 70–99)
Potassium: 3.2 mmol/L — ABNORMAL LOW (ref 3.5–5.1)
Sodium: 141 mmol/L (ref 135–145)
Total Bilirubin: 1.6 mg/dL — ABNORMAL HIGH (ref 0.3–1.2)
Total Protein: 5 g/dL — ABNORMAL LOW (ref 6.5–8.1)

## 2023-04-18 LAB — CBC
HCT: 49.4 % (ref 39.0–52.0)
Hemoglobin: 16.4 g/dL (ref 13.0–17.0)
MCH: 29.2 pg (ref 26.0–34.0)
MCHC: 33.2 g/dL (ref 30.0–36.0)
MCV: 87.9 fL (ref 80.0–100.0)
Platelets: 162 10*3/uL (ref 150–400)
RBC: 5.62 MIL/uL (ref 4.22–5.81)
RDW: 13.9 % (ref 11.5–15.5)
WBC: 9.3 10*3/uL (ref 4.0–10.5)
nRBC: 0 % (ref 0.0–0.2)

## 2023-04-18 LAB — GLUCOSE, CAPILLARY
Glucose-Capillary: 74 mg/dL (ref 70–99)
Glucose-Capillary: 87 mg/dL (ref 70–99)
Glucose-Capillary: 94 mg/dL (ref 70–99)
Glucose-Capillary: 106 mg/dL — ABNORMAL HIGH (ref 70–99)

## 2023-04-18 LAB — MAGNESIUM: Magnesium: 2.3 mg/dL (ref 1.7–2.4)

## 2023-04-18 LAB — HEPARIN LEVEL (UNFRACTIONATED): Heparin Unfractionated: 0.33 [IU]/mL (ref 0.30–0.70)

## 2023-04-18 SURGERY — EGD (ESOPHAGOGASTRODUODENOSCOPY)
Anesthesia: Monitor Anesthesia Care

## 2023-04-18 MED ORDER — GLYCOPYRROLATE 0.2 MG/ML IJ SOLN
INTRAMUSCULAR | Status: DC | PRN
Start: 2023-04-18 — End: 2023-04-18
  Administered 2023-04-18: .2 mg via INTRAVENOUS

## 2023-04-18 MED ORDER — ENOXAPARIN SODIUM 100 MG/ML IJ SOSY
90.0000 mg | PREFILLED_SYRINGE | Freq: Once | INTRAMUSCULAR | Status: AC
  Administered 2023-04-18: 90 mg via SUBCUTANEOUS
  Filled 2023-04-18: qty 1

## 2023-04-18 MED ORDER — PROPOFOL 10 MG/ML IV BOLUS
INTRAVENOUS | Status: DC | PRN
  Administered 2023-04-18 (×2): 30 mg via INTRAVENOUS
  Administered 2023-04-18: 50 mg via INTRAVENOUS
  Administered 2023-04-18 (×6): 30 mg via INTRAVENOUS
  Administered 2023-04-18: 10 mg via INTRAVENOUS

## 2023-04-18 MED ORDER — POTASSIUM CHLORIDE 10 MEQ/100ML IV SOLN
10.0000 meq | INTRAVENOUS | Status: DC
  Administered 2023-04-18 (×2): 10 meq via INTRAVENOUS
  Filled 2023-04-18 (×2): qty 100

## 2023-04-18 MED ORDER — PIPERACILLIN-TAZOBACTAM 3.375 G IVPB
3.3750 g | Freq: Three times a day (TID) | INTRAVENOUS | Status: AC
  Administered 2023-04-18 (×2): 3.375 g via INTRAVENOUS
  Filled 2023-04-18 (×2): qty 50

## 2023-04-18 MED ORDER — ENOXAPARIN SODIUM 100 MG/ML IJ SOSY
90.0000 mg | PREFILLED_SYRINGE | Freq: Two times a day (BID) | INTRAMUSCULAR | Status: DC
  Administered 2023-04-19 (×2): 90 mg via SUBCUTANEOUS
  Filled 2023-04-18 (×3): qty 1

## 2023-04-18 MED ORDER — SODIUM CHLORIDE 0.9 % IV SOLN: INTRAVENOUS | Status: DC

## 2023-04-18 MED ORDER — POTASSIUM CHLORIDE CRYS ER 20 MEQ PO TBCR: 40.0000 meq | EXTENDED_RELEASE_TABLET | Freq: Two times a day (BID) | ORAL | Status: DC

## 2023-04-18 MED ORDER — HEPARIN (PORCINE) 25000 UT/250ML-% IV SOLN: 800.0000 [IU]/h | INTRAVENOUS | Status: DC

## 2023-04-18 MED ORDER — KCL IN DEXTROSE-NACL 20-5-0.9 MEQ/L-%-% IV SOLN
INTRAVENOUS | Status: AC
  Filled 2023-04-18 (×2): qty 1000

## 2023-04-18 NOTE — Anesthesia Postprocedure Evaluation (Signed)
Anesthesia Post Note  Patient: Samuel [name]  Procedure(s) Performed: ESOPHAGOGASTRODUODENOSCOPY (EGD) BIOPSY     Patient location during evaluation: PACU Anesthesia Type: MAC Level of consciousness: awake and alert Pain management: pain level controlled Vital Signs Assessment: post-procedure vital signs reviewed and stable Respiratory status: spontaneous breathing, nonlabored ventilation and respiratory function stable Cardiovascular status: blood pressure returned to baseline and stable Postop Assessment: no apparent nausea or vomiting Anesthetic complications: no   There were no known notable events for this encounter.  Last Vitals:  Vitals:   04/18/23 1120 04/18/23 1155  BP: 130/78 137/82  Pulse: (!) 54 85  Resp: 12 14  Temp:  (!) 36.3 C  SpO2: 95% 94%    Last Pain:  Vitals:   04/18/23 1155  TempSrc: Oral  PainSc: 0-No pain                 Lowella Curb

## 2023-04-18 NOTE — Progress Notes (Signed)
HOSPITALIST ROUNDING NOTE Samuel [name] WUJ:[id number]  DOB: 03-Oct-1945  DOA: 04/14/2023  PCP: [name], MD  04/18/2023,1:02 PM   LOS: 4 days      Code Status: Full From: Home  current Dispo: Unclear    77 year old white male Prediabetes 6.2 Bilateral knee arthroscopies  Insidious onset dysphagia solids >liquids X 2 weeks  Associated with chest pain worse with deep inspiration at the time with anorexia CT scan right-sided PE started on heparin gtt. Found to have second-degree AVB with heart rates into the 30s and was transferred to [facility] after being given atropine and pacer PLEDs were placed--- patient initiated on heparin US abdomen = gallstones/gallbladder?  Positive Murphy sign--CT neck = 6 mm sclerotic T2 vertebral body lesion recommending MRI 10/12 GI was consulted-We are planning for barium swallow  Echocardiogram LVEF 55% grade 1 DD right heart strain on echo   EKG = second-degree AV block heart rate 30s RBBB-patient transferred to [facility] for EP evaluation?  Pacemaker placement  10/13 early a.m. patient found to be bradycardic into the 30s given atropine X1 troponins flat in the 60 range Continues to await further cardiology plan 10/14 barium swallow-log segment distal stenosis 5 cm with prestenotic esophageal dilatation 10/15      Malignant-appearing esophageal stenosis. Biopsied.Z-line regular, 37 cm from the incisors.3 cm hiatal hernia.                         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 causing Barium swallow as above, EGD as above D/w GI do not think esophageal stent is a good idea --need PEG tube placed-converting to Lovenox today to minimize  lab draws Will obtain CT abdomen pelvis to further workup  Mild hypokalemia Magnesium is okay-IV runs are causing pain-placed on D5 with 20 of K for 16 hours and then discontinue based on labs  Prediabetes A1c 6.2 Metformin allegedly causes diarrhea-had some low blood sugars overnight so placing on D5 until PEG can be placed  Hyperthyroidism suspected TSH 0.2 T4 1.2 thyroid does not appear enlarged-CT argues against thyroid swelling-outpatient TSH  Transaminitis slight hyperbilirubinemia- ?  Gallbladder stones with positive Murphy sign-leukocytosis on admission Discussed with general surgery-he is not having right upper quadrant pain now so would hold workup beyond CT scan--narrow off antibiotics in 24 hours which would complete 5 days  D/w friend Jeanett Schlein  DVT prophylaxis: Heparin GTT  Status is: Inpatient Remains inpatient appropriate because:   Requires further workup   Subjective:  Awake coherent but just back from Endo so little groggy No chest pain Looks comfortable but the hand was burning for the potassium so we changed his fluids around   Objective + exam Vitals:   04/18/23 1103 04/18/23 1110 04/18/23 1120 04/18/23 1155  BP: 112/67 117/82 130/78 137/82  Pulse: 64 68 (!) 54 85  Resp: (!) 22 (!) 23 12 14   Temp: (!) 96.8 F (36 C)   (!) 97.4 F (36.3 C)  TempSrc: Temporal   Oral  SpO2: 95% 92% 95% 94%  Weight:      Height:       Filed Weights   04/14/23 1247 04/18/23 0940  Weight: 92.5 kg 92.5 kg  Examination:  EOMI NCAT no focal deficit no icterus no pallor Chest is clear Abdomen slightly tender on the left side not the right No rebound no guarding No lower extremity edema  Data Reviewed: reviewed   CBC    Component Value Date/Time   WBC 9.3 04/18/2023 0449   RBC 5.62 04/18/2023 0449   HGB 16.4 04/18/2023 0449   HGB 15.4 08/09/2022 1425   HCT 49.4 04/18/2023 0449   HCT 46.0 08/09/2022 1425   PLT 162 04/18/2023 0449   PLT 281  08/09/2022 1425   MCV 87.9 04/18/2023 0449   MCV 87 08/09/2022 1425   MCH 29.2 04/18/2023 0449   MCHC 33.2 04/18/2023 0449   RDW 13.9 04/18/2023 0449   RDW 12.8 08/09/2022 1425   LYMPHSABS 2.3 04/14/2023 1416   LYMPHSABS 2.8 08/09/2022 1425   MONOABS 1.3 (H) 04/14/2023 1416   EOSABS 0.0 04/14/2023 1416   EOSABS 0.4 08/09/2022 1425   BASOSABS 0.0 04/14/2023 1416   BASOSABS 0.1 08/09/2022 1425      Latest Ref Rng & Units 04/18/2023    4:49 AM 04/16/2023    6:05 AM 04/16/2023    1:01 AM  CMP  Glucose 70 - 99 mg/dL 83  78  82   BUN 8 - 23 mg/dL 13  13  13    Creatinine 0.61 - 1.24 mg/dL 1.61  0.96  0.45   Sodium 135 - 145 mmol/L 141  139  140   Potassium 3.5 - 5.1 mmol/L 3.2  3.6  3.6   Chloride 98 - 111 mmol/L 108  106  107   CO2 22 - 32 mmol/L 22  23  23    Calcium 8.9 - 10.3 mg/dL 8.4  8.3  8.5   Total Protein 6.5 - 8.1 g/dL 5.0  4.7    Total Bilirubin 0.3 - 1.2 mg/dL 1.6  1.5    Alkaline Phos 38 - 126 U/L 48  42    AST 15 - 41 U/L 46  50    ALT 0 - 44 U/L 53  58       Scheduled Meds:  guaiFENesin  600 mg Oral BID   influenza vaccine adjuvanted  0.5 mL Intramuscular Tomorrow-1000   insulin aspart  0-15 Units Subcutaneous TID WC   pantoprazole (PROTONIX) IV  40 mg Intravenous QHS   Continuous Infusions:  piperacillin-tazobactam (ZOSYN)  IV 3.375 g (04/18/23 0548)   potassium chloride 10 mEq (04/18/23 1258)    Time 35 minutes  Rhetta Mura, MD  [facility]

## 2023-04-18 NOTE — Progress Notes (Addendum)
[facility]  Telephone:(336) [phone]   HEMATOLOGY ONCOLOGY INPATIENT CONSULTATION   Samuel [name]  DOB: 17-Jul-1945  MR#: [id number]  CSN#: [id number]    Requesting Physician: [facility] Dr. Mahala Menghini   Patient Care Team: [name], MD as PCP - General (Family Medicine) Wyline Mood Alben Spittle, MD as PCP - Cardiology (Cardiology)  Reason for consult: Gastric malignancy  History of present illness:   A 77 year old male presents with a chief complaint of progressive dysphagia over the past few weeks. Initially, the patient was able to force down food despite the difficulty, but the condition has worsened to the point where he is unable to swallow even liquids. The patient reports significant weight loss, although the exact amount is unknown. The patient has a history of smoking for about ten years, approximately 30 years ago, and was previously on metformin for type 2 diabetes, which he stopped taking due to severe diarrhea. He denies any other known medical conditions. 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  He presented to the emergency room on April 14, 2023 for worsening dysphagia and was admitted for further workup.  CT chest showed acute pulmonary embolism in the right main pulmonary artery extending into the upper and lower lobes.  Was thickening of the lower esophagus.  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  The stenosis measured 4 cm in length, the stenosis was traversed after downsizing scope.  A biopsy was obtained, the pathology result is still pending.  MEDICAL HISTORY:  Past Medical History:  Diagnosis Date   Diabetes mellitus without complication (HCC)     SURGICAL HISTORY: Past Surgical History:  Procedure Laterality Date   KNEE ARTHROPLASTY Bilateral     SOCIAL HISTORY: Social  History   Socioeconomic History   Marital status: Married    Spouse name: Not on file   Number of children: Not on file   Years of education: Not on file   Highest education level: Not on file  Occupational History   Not on file  Tobacco Use   Smoking status: Former    Current packs/day: 0.00    Average packs/day: 1 pack/day for 15.0 years (15.0 ttl pk-yrs)    Types: Cigarettes    Start date: 06/14/1970    Quit date: 06/14/1985    Years since quitting: 37.8   Smokeless tobacco: Never  Substance and Sexual Activity   Alcohol use: Yes    Alcohol/week: 1.0 - 2.0 standard drink of alcohol    Types: 1 - 2 Cans of beer per week   Drug use: No   Sexual activity: Not Currently  Other Topics Concern   Not on file  Social History Narrative   Not on file   Social Determinants of Health   Financial Resource Strain: Low Risk  (08/19/2022)   Overall Financial Resource Strain (CARDIA)    Difficulty of Paying Living Expenses: Not hard at all  Food Insecurity: No Food Insecurity (04/15/2023)   Hunger Vital Sign    Worried About Running Out of Food in the Last Year: Never true    Ran Out of Food in the Last Year: Never true  Transportation Needs: No Transportation Needs (04/15/2023)   PRAPARE - Administrator, Civil Service (Medical): No    Lack of Transportation (Non-Medical): No  Physical Activity: Sufficiently  BP: 130/78 137/82  Pulse: (!) 54 85  Resp: 12 14  Temp:  (!) 97.4 F (36.3 C)  SpO2: 95% 94%   Filed Weights   04/14/23 1247 04/18/23 0940  Weight: 203 lb 14.8 oz (92.5 kg) 203 lb 14.8 oz (92.5 kg)    GENERAL:alert, no distress and comfortable SKIN: skin color, texture, turgor are normal, no rashes or significant lesions EYES: normal, conjunctiva are pink and non-injected, sclera clear OROPHARYNX:no exudate, no erythema and lips, buccal mucosa, and tongue normal  NECK: supple, thyroid normal size, non-tender, without nodularity LYMPH:  no palpable lymphadenopathy in the cervical, axillary or inguinal LUNGS: clear to auscultation and percussion with normal breathing effort HEART: regular rate & rhythm and no murmurs and no lower extremity edema ABDOMEN:abdomen soft, non-tender and normal bowel sounds Musculoskeletal:no cyanosis of digits and no clubbing  PSYCH: alert & oriented x 3 with fluent speech NEURO: no focal motor/sensory deficits  LABORATORY DATA:  I have reviewed the data as listed Lab Results  Component Value Date   WBC 9.3 04/18/2023   HGB 16.4 04/18/2023   HCT 49.4 04/18/2023   MCV 87.9 04/18/2023   PLT 162 04/18/2023   Recent Labs    04/15/23 0302  04/16/23 0101 04/16/23 0605 04/18/23 0449  NA 138 140 139 141  K 3.5 3.6 3.6 3.2*  CL 106 107 106 108  CO2 23 23 23 22   GLUCOSE 82 82 78 83  BUN 20 13 13 13   CREATININE 0.77 0.83 0.89 1.09  CALCIUM 8.1* 8.5* 8.3* 8.4*  GFRNONAA >60 >60 >60 >60  PROT 5.2*  --  4.7* 5.0*  ALBUMIN 2.9*  --  2.5* 2.5*  AST 55*  --  50* 46*  ALT 66*  --  58* 53*  ALKPHOS 48  --  42 48  BILITOT 1.9*  --  1.5* 1.6*  BILIDIR 0.6*  --   --   --   IBILI 1.3*  --   --   --     RADIOGRAPHIC STUDIES: I have personally reviewed the radiological images as listed and agreed with the findings in the report. DG ESOPHAGUS W SINGLE CM (SOL OR THIN BA)  Result Date: 04/17/2023 CLINICAL DATA:  77 year old male with trouble swallowing both liquid and solid food. EXAM: ESOPHAGUS/BARIUM SWALLOW/TABLET STUDY TECHNIQUE: Single contrast examination was performed using thin liquid barium. This exam was performed by Loyce Dys PA-C, and was supervised and interpreted by Roanna Banning, MD. FLUOROSCOPY: Radiation Exposure Index (as provided by the fluoroscopic device): 13 mGy Kerma COMPARISON:  CT CHEST 04/14/2023. FINDINGS: Swallowing: Appears normal. No vestibular penetration or aspiration seen. Pharynx:  No masses, lesions or ulcers observed. Esophagus: Abrupt tapering in the thoracic esophagus with only a trickle of barium observed to intermittently pass into the stomach. Unable to assess for motility, hiatal hernia, and gastroesophageal reflux. 13mm barium tablet was not given. 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 Correlate with direct endoscopic visualization. Electronically Signed   By: Roanna Banning M.D.   On: 04/17/2023 10:24   ECHOCARDIOGRAM COMPLETE  Result Date: 04/15/2023    ECHOCARDIOGRAM REPORT   Patient Name:   DENNIS HEGEMAN Date of Exam: 04/15/2023 Medical Rec #:  [id number]   Height:       66.0 in Accession #:    [id number]   Weight:       203.9 lb Date of Birth:  1946/02/07   BSA:  [facility]  Telephone:(336) [phone]   HEMATOLOGY ONCOLOGY INPATIENT CONSULTATION   Samuel [name]  DOB: 17-Jul-1945  MR#: [id number]  CSN#: [id number]    Requesting Physician: [facility] Dr. Mahala Menghini   Patient Care Team: [name], MD as PCP - General (Family Medicine) Wyline Mood Alben Spittle, MD as PCP - Cardiology (Cardiology)  Reason for consult: Gastric malignancy  History of present illness:   A 77 year old male presents with a chief complaint of progressive dysphagia over the past few weeks. Initially, the patient was able to force down food despite the difficulty, but the condition has worsened to the point where he is unable to swallow even liquids. The patient reports significant weight loss, although the exact amount is unknown. The patient has a history of smoking for about ten years, approximately 30 years ago, and was previously on metformin for type 2 diabetes, which he stopped taking due to severe diarrhea. He denies any other known medical conditions. 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  He presented to the emergency room on April 14, 2023 for worsening dysphagia and was admitted for further workup.  CT chest showed acute pulmonary embolism in the right main pulmonary artery extending into the upper and lower lobes.  Was thickening of the lower esophagus.  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  The stenosis measured 4 cm in length, the stenosis was traversed after downsizing scope.  A biopsy was obtained, the pathology result is still pending.  MEDICAL HISTORY:  Past Medical History:  Diagnosis Date   Diabetes mellitus without complication (HCC)     SURGICAL HISTORY: Past Surgical History:  Procedure Laterality Date   KNEE ARTHROPLASTY Bilateral     SOCIAL HISTORY: Social  History   Socioeconomic History   Marital status: Married    Spouse name: Not on file   Number of children: Not on file   Years of education: Not on file   Highest education level: Not on file  Occupational History   Not on file  Tobacco Use   Smoking status: Former    Current packs/day: 0.00    Average packs/day: 1 pack/day for 15.0 years (15.0 ttl pk-yrs)    Types: Cigarettes    Start date: 06/14/1970    Quit date: 06/14/1985    Years since quitting: 37.8   Smokeless tobacco: Never  Substance and Sexual Activity   Alcohol use: Yes    Alcohol/week: 1.0 - 2.0 standard drink of alcohol    Types: 1 - 2 Cans of beer per week   Drug use: No   Sexual activity: Not Currently  Other Topics Concern   Not on file  Social History Narrative   Not on file   Social Determinants of Health   Financial Resource Strain: Low Risk  (08/19/2022)   Overall Financial Resource Strain (CARDIA)    Difficulty of Paying Living Expenses: Not hard at all  Food Insecurity: No Food Insecurity (04/15/2023)   Hunger Vital Sign    Worried About Running Out of Food in the Last Year: Never true    Ran Out of Food in the Last Year: Never true  Transportation Needs: No Transportation Needs (04/15/2023)   PRAPARE - Administrator, Civil Service (Medical): No    Lack of Transportation (Non-Medical): No  Physical Activity: Sufficiently  0.70 cm      LV E/e' medial:  7.1 LV IVS:        0.70 cm      LV e' lateral:   7.40 cm/s LVOT diam:     2.50 cm      LV E/e' lateral: 6.8 LV SV:         127 LV SV Index:   63 LVOT Area:     4.91 cm  LV Volumes (MOD) LV vol d, MOD A2C: 98.7 ml LV vol d, MOD A4C: 100.0 ml LV vol s, MOD A2C: 49.4 ml LV vol s, MOD A4C: 43.6 ml LV SV MOD A2C:     49.3 ml LV  SV MOD A4C:     100.0 ml LV SV MOD BP:      54.7 ml RIGHT VENTRICLE             IVC RV Basal diam:  2.70 cm     IVC diam: 1.40 cm RV S prime:     20.00 cm/s TAPSE (M-mode): 2.2 cm LEFT ATRIUM             Index        RIGHT ATRIUM          Index LA diam:        2.90 cm 1.44 cm/m   RA Area:     7.43 cm LA Vol (A2C):   34.7 ml 17.21 ml/m  RA Volume:   9.46 ml  4.69 ml/m LA Vol (A4C):   31.4 ml 15.57 ml/m LA Biplane Vol: 30.9 ml 15.32 ml/m  AORTIC VALVE LVOT Vmax:   130.00 cm/s LVOT Vmean:  87.200 cm/s LVOT VTI:    0.259 m AI PHT:      842 msec  AORTA Ao Root diam: 3.60 cm Ao Asc diam:  3.80 cm MITRAL VALVE               TRICUSPID VALVE MV Area (PHT): 1.93 cm    TR Peak grad:   7.2 mmHg MV Decel Time: 394 msec    TR Vmax:        134.00 cm/s MV E velocity: 50.10 cm/s MV A velocity: 93.80 cm/s  SHUNTS MV E/A ratio:  0.53        Systemic VTI:  0.26 m                            Systemic Diam: 2.50 cm [name] MD Electronically signed by [name] MD Signature Date/Time: 04/15/2023/3:27:38 PM    Final    VAS [place] LOWER EXTREMITY VENOUS (DVT)  Result Date: 04/15/2023  Lower Venous DVT Study Patient Name:  Samuel [name]  Date of Exam:   04/15/2023 Medical Rec #: [id number]    Accession #:    [id number] Date of Birth: Oct 17, 1945    Patient Gender: M Patient Age:   49 years Exam Location:  [facility] Procedure:      VAS [place] LOWER EXTREMITY VENOUS (DVT) Referring Phys: [facility] GOEL --------------------------------------------------------------------------------  Indications: Pulmonary embolism.  Comparison Study: No previous exams Performing Technologist: Jody Hill RVT, RDMS  Examination Guidelines: A complete evaluation includes B-mode imaging, spectral Doppler, color Doppler, and power Doppler as needed of all accessible portions of each vessel. Bilateral testing is considered an integral part of a complete examination. Limited examinations for reoccurring indications may be performed as noted. The  reflux portion of the exam is performed  [facility]  Telephone:(336) [phone]   HEMATOLOGY ONCOLOGY INPATIENT CONSULTATION   Samuel [name]  DOB: 17-Jul-1945  MR#: [id number]  CSN#: [id number]    Requesting Physician: [facility] Dr. Mahala Menghini   Patient Care Team: [name], MD as PCP - General (Family Medicine) Wyline Mood Alben Spittle, MD as PCP - Cardiology (Cardiology)  Reason for consult: Gastric malignancy  History of present illness:   A 77 year old male presents with a chief complaint of progressive dysphagia over the past few weeks. Initially, the patient was able to force down food despite the difficulty, but the condition has worsened to the point where he is unable to swallow even liquids. The patient reports significant weight loss, although the exact amount is unknown. The patient has a history of smoking for about ten years, approximately 30 years ago, and was previously on metformin for type 2 diabetes, which he stopped taking due to severe diarrhea. He denies any other known medical conditions. 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  He presented to the emergency room on April 14, 2023 for worsening dysphagia and was admitted for further workup.  CT chest showed acute pulmonary embolism in the right main pulmonary artery extending into the upper and lower lobes.  Was thickening of the lower esophagus.  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  The stenosis measured 4 cm in length, the stenosis was traversed after downsizing scope.  A biopsy was obtained, the pathology result is still pending.  MEDICAL HISTORY:  Past Medical History:  Diagnosis Date   Diabetes mellitus without complication (HCC)     SURGICAL HISTORY: Past Surgical History:  Procedure Laterality Date   KNEE ARTHROPLASTY Bilateral     SOCIAL HISTORY: Social  History   Socioeconomic History   Marital status: Married    Spouse name: Not on file   Number of children: Not on file   Years of education: Not on file   Highest education level: Not on file  Occupational History   Not on file  Tobacco Use   Smoking status: Former    Current packs/day: 0.00    Average packs/day: 1 pack/day for 15.0 years (15.0 ttl pk-yrs)    Types: Cigarettes    Start date: 06/14/1970    Quit date: 06/14/1985    Years since quitting: 37.8   Smokeless tobacco: Never  Substance and Sexual Activity   Alcohol use: Yes    Alcohol/week: 1.0 - 2.0 standard drink of alcohol    Types: 1 - 2 Cans of beer per week   Drug use: No   Sexual activity: Not Currently  Other Topics Concern   Not on file  Social History Narrative   Not on file   Social Determinants of Health   Financial Resource Strain: Low Risk  (08/19/2022)   Overall Financial Resource Strain (CARDIA)    Difficulty of Paying Living Expenses: Not hard at all  Food Insecurity: No Food Insecurity (04/15/2023)   Hunger Vital Sign    Worried About Running Out of Food in the Last Year: Never true    Ran Out of Food in the Last Year: Never true  Transportation Needs: No Transportation Needs (04/15/2023)   PRAPARE - Administrator, Civil Service (Medical): No    Lack of Transportation (Non-Medical): No  Physical Activity: Sufficiently  [facility]  Telephone:(336) [phone]   HEMATOLOGY ONCOLOGY INPATIENT CONSULTATION   Samuel [name]  DOB: 17-Jul-1945  MR#: [id number]  CSN#: [id number]    Requesting Physician: [facility] Dr. Mahala Menghini   Patient Care Team: [name], MD as PCP - General (Family Medicine) Wyline Mood Alben Spittle, MD as PCP - Cardiology (Cardiology)  Reason for consult: Gastric malignancy  History of present illness:   A 77 year old male presents with a chief complaint of progressive dysphagia over the past few weeks. Initially, the patient was able to force down food despite the difficulty, but the condition has worsened to the point where he is unable to swallow even liquids. The patient reports significant weight loss, although the exact amount is unknown. The patient has a history of smoking for about ten years, approximately 30 years ago, and was previously on metformin for type 2 diabetes, which he stopped taking due to severe diarrhea. He denies any other known medical conditions. 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  He presented to the emergency room on April 14, 2023 for worsening dysphagia and was admitted for further workup.  CT chest showed acute pulmonary embolism in the right main pulmonary artery extending into the upper and lower lobes.  Was thickening of the lower esophagus.  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  The stenosis measured 4 cm in length, the stenosis was traversed after downsizing scope.  A biopsy was obtained, the pathology result is still pending.  MEDICAL HISTORY:  Past Medical History:  Diagnosis Date   Diabetes mellitus without complication (HCC)     SURGICAL HISTORY: Past Surgical History:  Procedure Laterality Date   KNEE ARTHROPLASTY Bilateral     SOCIAL HISTORY: Social  History   Socioeconomic History   Marital status: Married    Spouse name: Not on file   Number of children: Not on file   Years of education: Not on file   Highest education level: Not on file  Occupational History   Not on file  Tobacco Use   Smoking status: Former    Current packs/day: 0.00    Average packs/day: 1 pack/day for 15.0 years (15.0 ttl pk-yrs)    Types: Cigarettes    Start date: 06/14/1970    Quit date: 06/14/1985    Years since quitting: 37.8   Smokeless tobacco: Never  Substance and Sexual Activity   Alcohol use: Yes    Alcohol/week: 1.0 - 2.0 standard drink of alcohol    Types: 1 - 2 Cans of beer per week   Drug use: No   Sexual activity: Not Currently  Other Topics Concern   Not on file  Social History Narrative   Not on file   Social Determinants of Health   Financial Resource Strain: Low Risk  (08/19/2022)   Overall Financial Resource Strain (CARDIA)    Difficulty of Paying Living Expenses: Not hard at all  Food Insecurity: No Food Insecurity (04/15/2023)   Hunger Vital Sign    Worried About Running Out of Food in the Last Year: Never true    Ran Out of Food in the Last Year: Never true  Transportation Needs: No Transportation Needs (04/15/2023)   PRAPARE - Administrator, Civil Service (Medical): No    Lack of Transportation (Non-Medical): No  Physical Activity: Sufficiently  BP: 130/78 137/82  Pulse: (!) 54 85  Resp: 12 14  Temp:  (!) 97.4 F (36.3 C)  SpO2: 95% 94%   Filed Weights   04/14/23 1247 04/18/23 0940  Weight: 203 lb 14.8 oz (92.5 kg) 203 lb 14.8 oz (92.5 kg)    GENERAL:alert, no distress and comfortable SKIN: skin color, texture, turgor are normal, no rashes or significant lesions EYES: normal, conjunctiva are pink and non-injected, sclera clear OROPHARYNX:no exudate, no erythema and lips, buccal mucosa, and tongue normal  NECK: supple, thyroid normal size, non-tender, without nodularity LYMPH:  no palpable lymphadenopathy in the cervical, axillary or inguinal LUNGS: clear to auscultation and percussion with normal breathing effort HEART: regular rate & rhythm and no murmurs and no lower extremity edema ABDOMEN:abdomen soft, non-tender and normal bowel sounds Musculoskeletal:no cyanosis of digits and no clubbing  PSYCH: alert & oriented x 3 with fluent speech NEURO: no focal motor/sensory deficits  LABORATORY DATA:  I have reviewed the data as listed Lab Results  Component Value Date   WBC 9.3 04/18/2023   HGB 16.4 04/18/2023   HCT 49.4 04/18/2023   MCV 87.9 04/18/2023   PLT 162 04/18/2023   Recent Labs    04/15/23 0302  04/16/23 0101 04/16/23 0605 04/18/23 0449  NA 138 140 139 141  K 3.5 3.6 3.6 3.2*  CL 106 107 106 108  CO2 23 23 23 22   GLUCOSE 82 82 78 83  BUN 20 13 13 13   CREATININE 0.77 0.83 0.89 1.09  CALCIUM 8.1* 8.5* 8.3* 8.4*  GFRNONAA >60 >60 >60 >60  PROT 5.2*  --  4.7* 5.0*  ALBUMIN 2.9*  --  2.5* 2.5*  AST 55*  --  50* 46*  ALT 66*  --  58* 53*  ALKPHOS 48  --  42 48  BILITOT 1.9*  --  1.5* 1.6*  BILIDIR 0.6*  --   --   --   IBILI 1.3*  --   --   --     RADIOGRAPHIC STUDIES: I have personally reviewed the radiological images as listed and agreed with the findings in the report. DG ESOPHAGUS W SINGLE CM (SOL OR THIN BA)  Result Date: 04/17/2023 CLINICAL DATA:  77 year old male with trouble swallowing both liquid and solid food. EXAM: ESOPHAGUS/BARIUM SWALLOW/TABLET STUDY TECHNIQUE: Single contrast examination was performed using thin liquid barium. This exam was performed by Loyce Dys PA-C, and was supervised and interpreted by Roanna Banning, MD. FLUOROSCOPY: Radiation Exposure Index (as provided by the fluoroscopic device): 13 mGy Kerma COMPARISON:  CT CHEST 04/14/2023. FINDINGS: Swallowing: Appears normal. No vestibular penetration or aspiration seen. Pharynx:  No masses, lesions or ulcers observed. Esophagus: Abrupt tapering in the thoracic esophagus with only a trickle of barium observed to intermittently pass into the stomach. Unable to assess for motility, hiatal hernia, and gastroesophageal reflux. 13mm barium tablet was not given. 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 Correlate with direct endoscopic visualization. Electronically Signed   By: Roanna Banning M.D.   On: 04/17/2023 10:24   ECHOCARDIOGRAM COMPLETE  Result Date: 04/15/2023    ECHOCARDIOGRAM REPORT   Patient Name:   DENNIS HEGEMAN Date of Exam: 04/15/2023 Medical Rec #:  [id number]   Height:       66.0 in Accession #:    [id number]   Weight:       203.9 lb Date of Birth:  1946/02/07   BSA:  0.70 cm      LV E/e' medial:  7.1 LV IVS:        0.70 cm      LV e' lateral:   7.40 cm/s LVOT diam:     2.50 cm      LV E/e' lateral: 6.8 LV SV:         127 LV SV Index:   63 LVOT Area:     4.91 cm  LV Volumes (MOD) LV vol d, MOD A2C: 98.7 ml LV vol d, MOD A4C: 100.0 ml LV vol s, MOD A2C: 49.4 ml LV vol s, MOD A4C: 43.6 ml LV SV MOD A2C:     49.3 ml LV  SV MOD A4C:     100.0 ml LV SV MOD BP:      54.7 ml RIGHT VENTRICLE             IVC RV Basal diam:  2.70 cm     IVC diam: 1.40 cm RV S prime:     20.00 cm/s TAPSE (M-mode): 2.2 cm LEFT ATRIUM             Index        RIGHT ATRIUM          Index LA diam:        2.90 cm 1.44 cm/m   RA Area:     7.43 cm LA Vol (A2C):   34.7 ml 17.21 ml/m  RA Volume:   9.46 ml  4.69 ml/m LA Vol (A4C):   31.4 ml 15.57 ml/m LA Biplane Vol: 30.9 ml 15.32 ml/m  AORTIC VALVE LVOT Vmax:   130.00 cm/s LVOT Vmean:  87.200 cm/s LVOT VTI:    0.259 m AI PHT:      842 msec  AORTA Ao Root diam: 3.60 cm Ao Asc diam:  3.80 cm MITRAL VALVE               TRICUSPID VALVE MV Area (PHT): 1.93 cm    TR Peak grad:   7.2 mmHg MV Decel Time: 394 msec    TR Vmax:        134.00 cm/s MV E velocity: 50.10 cm/s MV A velocity: 93.80 cm/s  SHUNTS MV E/A ratio:  0.53        Systemic VTI:  0.26 m                            Systemic Diam: 2.50 cm [name] MD Electronically signed by [name] MD Signature Date/Time: 04/15/2023/3:27:38 PM    Final    VAS [place] LOWER EXTREMITY VENOUS (DVT)  Result Date: 04/15/2023  Lower Venous DVT Study Patient Name:  Samuel [name]  Date of Exam:   04/15/2023 Medical Rec #: [id number]    Accession #:    [id number] Date of Birth: Oct 17, 1945    Patient Gender: M Patient Age:   49 years Exam Location:  [facility] Procedure:      VAS [place] LOWER EXTREMITY VENOUS (DVT) Referring Phys: [facility] GOEL --------------------------------------------------------------------------------  Indications: Pulmonary embolism.  Comparison Study: No previous exams Performing Technologist: Jody Hill RVT, RDMS  Examination Guidelines: A complete evaluation includes B-mode imaging, spectral Doppler, color Doppler, and power Doppler as needed of all accessible portions of each vessel. Bilateral testing is considered an integral part of a complete examination. Limited examinations for reoccurring indications may be performed as noted. The  reflux portion of the exam is performed  [facility]  Telephone:([phone]) [phone]   HEMATOLOGY ONCOLOGY INPATIENT CONSULTATION   Samuel [name]  DOB: 17-Jul-1945  MR#: [id number]  CSN#: [id number]    Requesting Physician: [facility] Dr. Mahala Menghini   Patient Care Team: [name], MD as PCP - General (Family Medicine) Wyline Mood Alben Spittle, MD as PCP - Cardiology (Cardiology)  Reason for consult: Gastric malignancy  History of present illness:   A 77 year old male presents with a chief complaint of progressive dysphagia over the past few weeks. Initially, the patient was able to force down food despite the difficulty, but the condition has worsened to the point where he is unable to swallow even liquids. The patient reports significant weight loss, although the exact amount is unknown. The patient has a history of smoking for about ten years, approximately 30 years ago, and was previously on metformin for type 2 diabetes, which he stopped taking due to severe diarrhea. He denies any other known medical conditions. 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  He presented to the emergency room on April 14, 2023 for worsening dysphagia and was admitted for further workup.  CT chest showed acute pulmonary embolism in the right main pulmonary artery extending into the upper and lower lobes.  Was thickening of the lower esophagus.  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  The stenosis measured 4 cm in length, the stenosis was traversed after downsizing scope.  A biopsy was obtained, the pathology result is still pending.  MEDICAL HISTORY:  Past Medical History:  Diagnosis Date   Diabetes mellitus without complication (HCC)     SURGICAL HISTORY: Past Surgical History:  Procedure Laterality Date   KNEE ARTHROPLASTY Bilateral     SOCIAL HISTORY: Social  History   Socioeconomic History   Marital status: Married    Spouse name: Not on file   Number of children: Not on file   Years of education: Not on file   Highest education level: Not on file  Occupational History   Not on file  Tobacco Use   Smoking status: Former    Current packs/day: 0.00    Average packs/day: 1 pack/day for 15.0 years (15.0 ttl pk-yrs)    Types: Cigarettes    Start date: 06/14/1970    Quit date: 06/14/1985    Years since quitting: 37.8   Smokeless tobacco: Never  Substance and Sexual Activity   Alcohol use: Yes    Alcohol/week: 1.0 - 2.0 standard drink of alcohol    Types: 1 - 2 Cans of beer per week   Drug use: No   Sexual activity: Not Currently  Other Topics Concern   Not on file  Social History Narrative   Not on file   Social Determinants of Health   Financial Resource Strain: Low Risk  (08/19/2022)   Overall Financial Resource Strain (CARDIA)    Difficulty of Paying Living Expenses: Not hard at all  Food Insecurity: No Food Insecurity (04/15/2023)   Hunger Vital Sign    Worried About Running Out of Food in the Last Year: Never true    Ran Out of Food in the Last Year: Never true  Transportation Needs: No Transportation Needs (04/15/2023)   PRAPARE - Administrator, Civil Service (Medical): No    Lack of Transportation (Non-Medical): No  Physical Activity: Sufficiently

## 2023-04-18 NOTE — Progress Notes (Signed)
Chaplain happened to enter elevator as another passenger was leaving who happened to be a friend of the pt. She handed me a note indicating the pt would like to be baptized.   Chaplain went to directly to the pt's room and after a time was inspired to baptize the pt.  Chaplain prepared a printed [employer] for the pt.  Pt's nurse joined [place] at bedside as Chaplain baptized pt.  Afterwards. Pt & Chaplain enjoyed a lively discussion about the interplay and inter-connectiveness of faith and science.  Chaplain will refer pt to day Chaplains to arrange for HCPOA to be notarized.  Vernell Morgans Chaplain

## 2023-04-18 NOTE — Progress Notes (Signed)
   04/18/23 1053  Spiritual Encounters  Type of Visit Attempt (pt unavailable)  Care provided to: Patient  Conversation partners present during encounter Nurse  Reason for visit Advance directives   Chaplain attempted visit to deliver ACD education.  Pt not in room- Rn stated he was in endoscopy.  Chaplain will return this afternoon.  Chaplain services remain available by Spiritual Consult or for emergent cases, paging [phone]  Chaplain Raelene Bott, MDiv [email or web address] [email or web address] [phone]

## 2023-04-18 NOTE — Progress Notes (Addendum)
PHARMACY - ANTICOAGULATION CONSULT NOTE  Pharmacy Consult for heparin Indication: acute pulmonary embolus  Allergies  Allergen Reactions   Penicillins     Shortness of breath   Sulfa Antibiotics     Blisters    Procaine Other (See Comments)    Caused fatigue    Patient Measurements: Height: 5\' 6"  (167.6 cm) Weight: 92.5 kg (203 lb 14.8 oz) IBW/kg (Calculated) : 63.8 Heparin Dosing Weight: 84 kg  Vital Signs: Temp: 97.4 F (36.3 C) (10/15 1155) Temp Source: Oral (10/15 1155) BP: 137/82 (10/15 1155) Pulse Rate: 85 (10/15 1155)  Labs: Recent Labs    04/15/23 2224 04/16/23 0101 04/16/23 0605 04/16/23 1314 04/16/23 2306 04/17/23 0404 04/18/23 0449  HGB   < >  --  15.3  --   --  15.4 16.4  HCT  --   --  45.6  --   --  46.0 49.4  PLT  --   --  153  --   --  149* 162  HEPARINUNFRC  --   --  0.87*   < > 0.65 0.48 0.33  CREATININE  --  0.83 0.89  --   --   --  1.09  TROPONINIHS  --  67*  --   --   --   --   --    < > = values in this interval not displayed.    Estimated Creatinine Clearance: 60.4 mL/min (by C-G formula based on SCr of 1.09 mg/dL).   Medical History: Past Medical History:  Diagnosis Date   Diabetes mellitus without complication [facility])     Assessment: Patient is a 77 y.o M who presented to the ED on 04/14/23 with c/o CP and difficulty swallowing. Chest CT showed acute PE in the right main pulmonary artery extending into the upper and lower lobes with no evidence of RHS. Pharmacy has been consulted to dose heparin for VTE treatment. He is not on anticoagulation PTA.    He is s/p EGD today s/p biopsy. Per Dr. Lorenso Quarry, heparin can be restarted now -Heparin level this morning was 0.33 on 750 units/hr, CBC stable  Goal of Therapy:  Heparin level 0.3-0.7 units/ml Monitor platelets by anticoagulation protocol: Yes   Plan:  -Restart heparin at 800 units/hr -heparin level in 8 hrs -Will follow procedural plans  Harland German, PharmD Clinical  Pharmacist **Pharmacist phone directory can now be found on [email or web address] ([email or web address]).  Listed under [facility].   Addendum -plans to change to lovenox to avoid lab draws  Plan --Lovenox 90mg  sq q12h -Will follow plans for procedures  Harland German, PharmD Clinical Pharmacist **Pharmacist phone directory can now be found on [email or web address] ([email or web address]).  Listed under [facility].

## 2023-04-18 NOTE — Anesthesia Preprocedure Evaluation (Signed)
Anesthesia Evaluation  Patient identified by MRN, date of birth, ID band Patient awake    Reviewed: Allergy & Precautions, H&P , NPO status , Patient's Chart, lab work & pertinent test results  Airway Mallampati: II  TM Distance: >3 FB Neck ROM: Full    Dental no notable dental hx.    Pulmonary neg pulmonary ROS, former smoker   Pulmonary exam normal breath sounds clear to auscultation       Cardiovascular hypertension, Normal cardiovascular exam+ dysrhythmias  Rhythm:Regular Rate:Normal     Neuro/Psych negative neurological ROS  negative psych ROS   GI/Hepatic negative GI ROS, Neg liver ROS,,,  Endo/Other  negative endocrine ROSdiabetes    Renal/GU negative Renal ROS  negative genitourinary   Musculoskeletal negative musculoskeletal ROS (+)    Abdominal   Peds negative pediatric ROS (+)  Hematology negative hematology ROS (+)   Anesthesia Other Findings   Reproductive/Obstetrics negative OB ROS                             Anesthesia Physical Anesthesia Plan  ASA: 3  Anesthesia Plan: MAC   Post-op Pain Management: Minimal or no pain anticipated   Induction: Intravenous  PONV Risk Score and Plan: 1 and Propofol infusion and Treatment may vary due to age or medical condition  Airway Management Planned: Nasal Cannula  Additional Equipment:   Intra-op Plan:   Post-operative Plan:   Informed Consent: I have reviewed the patients History and Physical, chart, labs and discussed the procedure including the risks, benefits and alternatives for the proposed anesthesia with the patient or authorized representative who has indicated his/her understanding and acceptance.     Dental advisory given  Plan Discussed with: CRNA  Anesthesia Plan Comments:        Anesthesia Quick Evaluation

## 2023-04-18 NOTE — Op Note (Signed)
[facility] Patient Name: Samuel [name] Procedure Date : 04/18/2023 MRN: [id number] Attending MD: Liliane Shi DO, DO, [id number] Date of Birth: 16-Sep-1945 CSN: [id number] Age: 77 Admit Type: Inpatient Procedure:                Upper GI endoscopy Indications:              Dysphagia, Abnormal cine-esophagram Providers:                Liliane Shi DO, DO, Fransisca Connors, Rozetta Nunnery, Technician Referring MD:              Medicines:                See the Anesthesia note for documentation of the                            administered medications Complications:            No immediate complications. Estimated Blood Loss:     Estimated blood loss was minimal. Procedure:                Pre-Anesthesia Assessment:                           - ASA Grade Assessment: III - A patient with severe                            systemic disease.                           - The risks and benefits of the procedure and the                            sedation options and risks were discussed with the                            patient. All questions were answered and informed                            consent was obtained.                           After obtaining informed consent, the endoscope was                            passed under direct vision. Throughout the                            procedure, the patient's blood pressure, pulse, and                            oxygen saturations were monitored continuously. The                            GIF-H190 ([id number]) Olympus endoscope was introduced  through the mouth, and advanced to the second part                            of duodenum. The upper GI endoscopy was somewhat                            difficult due to narrowing. Successful completion                            of the procedure was aided by withdrawing the scope                            and replacing with the  neonatal endoscope. The                            upper GI endoscopy was somewhat difficult due to                            excessive bleeding. Successful completion of the                            procedure was aided by lavage. The patient                            tolerated the procedure well. Scope In: Scope Out: Findings:      One malignant-appearing, intrinsic severe (stenosis; an endoscope cannot       pass) stenosis was found 30 to 34 cm from the incisors. This stenosis       measured 4 cm (in length). The stenosis was traversed after downsizing       scope. Biopsies were taken with a cold forceps for histology.      The Z-line was regular and was found 37 cm from the incisors.      A 3 cm hiatal hernia was present.      The entire examined stomach was normal. Biopsies were taken with a cold       forceps for Helicobacter pylori testing.      Localized mild inflammation characterized by congestion (edema),       erythema and friability was found in the duodenal bulb. Biopsies were       taken with a cold forceps for histology. Impression:               - Malignant-appearing esophageal stenosis. Biopsied.                           - Z-line regular, 37 cm from the incisors.                           - 3 cm hiatal hernia.                           - Normal stomach. Biopsied.                           - Duodenitis. Biopsied. Recommendation:           -  Await pathology results.                           - NPO.                           - Continue present medications.                           - Given need for PO intake for medication                            administration and nutrition, consider either                            esophageal stent placement or PEG tube placement                            (by Interventional Radiology). Procedure Code(s):        --- Professional ---                           [phone], Esophagogastroduodenoscopy, flexible,                             transoral; with biopsy, single or multiple Diagnosis Code(s):        --- Professional ---                           K22.2, Esophageal obstruction                           K44.9, Diaphragmatic hernia without obstruction or                            gangrene                           K29.80, Duodenitis without bleeding                           R13.10, Dysphagia, unspecified                           R93.3, Abnormal findings on diagnostic imaging of                            other parts of digestive tract CPT copyright 2022 American Medical Association. All rights reserved. The codes documented in this report are preliminary and upon coder review may  be revised to meet current compliance requirements. Dr Liliane Shi, DO Liliane Shi DO, DO 04/18/2023 11:10:21 AM Number of Addenda: 0

## 2023-04-18 NOTE — Transfer of Care (Signed)
Immediate Anesthesia Transfer of Care Note  Patient: Winson Eichorn  Procedure(s) Performed: ESOPHAGOGASTRODUODENOSCOPY (EGD) BIOPSY  Patient Location: Endoscopy Unit  Anesthesia Type:MAC  Level of Consciousness: sedated and responds to stimulation  Airway & Oxygen Therapy: Patient Spontanous Breathing and Patient connected to nasal cannula oxygen  Post-op Assessment: Report given to RN and Post -op Vital signs reviewed and stable  Post vital signs: Reviewed and stable  Last Vitals:  Vitals Value Taken Time  BP    Temp    Pulse 64 04/18/23 1103  Resp 22 04/18/23 1103  SpO2 95 % 04/18/23 1103  Vitals shown include unfiled device data.  Last Pain:  Vitals:   04/18/23 0940  TempSrc: Temporal  PainSc: 2          Complications: No notable events documented.

## 2023-04-18 NOTE — Progress Notes (Signed)
Rounding Note    Patient Name: Samuel [name] Date of Encounter: 04/18/2023  [facility] Cardiologist: [name], MD   Subjective   Slept well, very worried about not being able to swallow, he feels like once he got up an on his feet yesterday he did well, better then he thought he would  Inpatient Medications    Scheduled Meds:  guaiFENesin  600 mg Oral BID   influenza vaccine adjuvanted  0.5 mL Intramuscular Tomorrow-1000   insulin aspart  0-15 Units Subcutaneous TID WC   pantoprazole (PROTONIX) IV  40 mg Intravenous QHS   potassium chloride  40 mEq Oral BID   Continuous Infusions:  sodium chloride 20 mL/hr at 04/18/23 0425   piperacillin-tazobactam (ZOSYN)  IV 3.375 g (04/18/23 0548)   PRN Meds: acetaminophen **OR** acetaminophen, atropine, polyethylene glycol   Vital Signs    Vitals:   04/17/23 1634 04/17/23 1942 04/17/23 2343 04/18/23 0354  BP: (!) 133/59 (!) 121/90 128/62 (!) 130/57  Pulse: (!) 39 (!) 42 (!) 36 (!) 56  Resp: 18 18 18 18   Temp: 98.4 F (36.9 C) 98.8 F (37.1 C) 98.8 F (37.1 C) 98.8 F (37.1 C)  TempSrc: Oral Oral Oral Oral  SpO2: 95% 97% 98% 98%  Weight:      Height:        Intake/Output Summary (Last 24 hours) at 04/18/2023 0826 Last data filed at 04/18/2023 0425 Gross per 24 hour  Intake 365.46 ml  Output 1100 ml  Net -734.54 ml      04/14/2023   12:47 PM 12/15/2022   10:13 AM 08/09/2022    1:47 PM  Last 3 Weights  Weight (lbs) 203 lb 14.8 oz 204 lb 203 lb 6.4 oz  Weight (kg) 92.5 kg 92.534 kg 92.262 kg      Telemetry    SR/SB 50's-60's with intermittent 2:1 AVblock 29-30's slower rates overnight with sleep, no evidence of CHB  - Personally Reviewed  ECG    No new EKGs - Personally Reviewed  Physical Exam   GEN: No acute distress.   Neck: No JVD Cardiac: RRR, no murmurs, rubs, or gallops.  Respiratory: CTA b/l. GI: Soft, nontender  MS: No edema; No deformity. Neuro:  Nonfocal  Psych: Normal affect    Labs    High Sensitivity Troponin:   Recent Labs  Lab 04/14/23 1416 04/14/23 1752 04/16/23 0101  TROPONINIHS 56* 67* 67*     Chemistry Recent Labs  Lab 04/15/23 0302 04/15/23 1028 04/16/23 0101 04/16/23 0605 04/18/23 0449  NA 138  --  140 139 141  K 3.5  --  3.6 3.6 3.2*  CL 106  --  107 106 108  CO2 23  --  23 23 22   GLUCOSE 82  --  82 78 83  BUN 20  --  13 13 13   CREATININE 0.77  --  0.83 0.89 1.09  CALCIUM 8.1*  --  8.5* 8.3* 8.4*  MG  --  2.4 2.2  --  2.3  PROT 5.2*  --   --  4.7* 5.0*  ALBUMIN 2.9*  --   --  2.5* 2.5*  AST 55*  --   --  50* 46*  ALT 66*  --   --  58* 53*  ALKPHOS 48  --   --  42 48  BILITOT 1.9*  --   --  1.5* 1.6*  GFRNONAA >60  --  >60 >60 >60  ANIONGAP 9  --  10 10 11     Lipids No results for input(s): "CHOL", "TRIG", "HDL", "LABVLDL", "LDLCALC", "CHOLHDL" in the last 168 hours.  Hematology Recent Labs  Lab 04/16/23 0605 04/17/23 0404 04/18/23 0449  WBC 9.9 10.4 9.3  RBC 5.21 5.33 5.62  HGB 15.3 15.4 16.4  HCT 45.6 46.0 49.4  MCV 87.5 86.3 87.9  MCH 29.4 28.9 29.2  MCHC 33.6 33.5 33.2  RDW 13.8 13.6 13.9  PLT 153 149* 162   Thyroid  Recent Labs  Lab 04/15/23 1750  TSH 0.242*  FREET4 1.28*    BNPNo results for input(s): "BNP", "PROBNP" in the last 168 hours.  DDimer No results for input(s): "DDIMER" in the last 168 hours.   Radiology    DG ESOPHAGUS W SINGLE CM (SOL OR THIN BA)  Result Date: 04/17/2023 CLINICAL DATA:  77 year old male with trouble swallowing both liquid and solid food. EXAM: ESOPHAGUS/BARIUM SWALLOW/TABLET STUDY TECHNIQUE: Single contrast examination was performed using thin liquid barium. This exam was performed by Loyce Dys PA-C, and was supervised and interpreted by Roanna Banning, MD. FLUOROSCOPY: Radiation Exposure Index (as provided by the fluoroscopic device): 13 mGy Kerma COMPARISON:  CT CHEST 04/14/2023. FINDINGS: Swallowing: Appears normal. No vestibular penetration or aspiration seen.  Pharynx:  No masses, lesions or ulcers observed. Esophagus: Abrupt tapering in the thoracic esophagus with only a trickle of barium observed to intermittently pass into the stomach. Unable to assess for motility, hiatal hernia, and gastroesophageal reflux. 13mm barium tablet was not given. 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 Correlate with direct endoscopic visualization. Electronically Signed   By: Roanna Banning M.D.   On: 04/17/2023 10:24    Cardiac Studies   04/15/23: TTE  1. Left ventricular ejection fraction, by estimation, is 55%. The left  ventricle has normal function. The left ventricle has no regional wall  motion abnormalities. Left ventricular diastolic parameters are consistent  with Grade I diastolic dysfunction  (impaired relaxation).   2. Right ventricular systolic function is normal. The right ventricular  size is normal. Tricuspid regurgitation signal is inadequate for assessing  PA pressure.   3. The mitral valve is normal in structure. Trivial mitral valve  regurgitation. No evidence of mitral stenosis.   4. The aortic valve is grossly normal. Aortic valve regurgitation is mild  to moderate. Aortic valve sclerosis is present, with no evidence of aortic  valve stenosis.   5. The inferior vena cava is normal in size with greater than 50%  respiratory variability, suggesting right atrial pressure of 3 mmHg.   Patient Profile     77 y.o. 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 HR rose to the 70's  Upon entering his room this morning he is asleep, in 2:1 AVBlock 29-30bpm, once woken and animated in bed his HR rapidly rose to 60's with 1:1 conduction  Remains unclear weather or not he has symptomatic bradycardia He is on heparin gtt for his DVTs/PE Pending EGD today  Dr. Jimmey Ralph has seen the patient:  For GI procedures, would  proceed, if he had hemodynamically unstable bradycardia recommend manage dopamine, atropine If he has symptomatic bradycardia here, we could bridge with temp wire untili his other procedures are completed, he is able to swallow and ready to transition to oral anticoagulation       As per attending team:   # acute PE #acute b/l LE DVTs # progressive dysphagia     Esophageal stenosis #gallstones/sludge   ? R sternoclavicular swelling ?sclerotic lesion T2 noted on CT    For questions or updates, please contact [facility] Please consult [email or web address] for contact info under        Signed, Sheilah Pigeon, PA-C  04/18/2023, 8:26 AM

## 2023-04-18 NOTE — Interval H&P Note (Signed)
History and Physical Interval Note:  04/18/2023 10:23 AM  Samuel [name]  has presented today for surgery, with the diagnosis of Dsyphagia, abnormal esophagram.  The various methods of treatment have been discussed with the patient and family. After consideration of risks, benefits and other options for treatment, the patient has consented to  Procedure(s): ESOPHAGOGASTRODUODENOSCOPY (EGD) (N/A) as a surgical intervention.  The patient's history has been reviewed, patient examined, no change in status, stable for surgery.  I have reviewed the patient's chart and labs.  Questions were answered to the patient's satisfaction.     Lynann Bologna

## 2023-04-18 NOTE — TOC Benefit Eligibility Note (Signed)
Patient Product/process development scientist completed.    The patient is insured through [facility]. Patient has Medicare and is not eligible for a copay card, but may be able to apply for patient assistance, if available.    Ran test claim for Eliquis 5 mg and the current 30 day co-pay is $0.00.  Ran test claim for Xarelto 20 mg and the current 30 day co-pay is $0.00.  This test claim was processed through [facility]- copay amounts may vary at other pharmacies due to pharmacy/plan contracts, or as the patient moves through the different stages of their insurance plan.     Samuel [name], CPHT Pharmacy Technician III Certified Patient Advocate [facility] Patient Advocate Team Direct Number: [phone]  Fax: [phone]

## 2023-04-19 ENCOUNTER — Inpatient Hospital Stay (HOSPITAL_COMMUNITY): Payer: 59

## 2023-04-19 DIAGNOSIS — R7989 Other specified abnormal findings of blood chemistry: Secondary | ICD-10-CM

## 2023-04-19 DIAGNOSIS — I441 Atrioventricular block, second degree: Secondary | ICD-10-CM

## 2023-04-19 DIAGNOSIS — M7989 Other specified soft tissue disorders: Secondary | ICD-10-CM

## 2023-04-19 DIAGNOSIS — R1319 Other dysphagia: Secondary | ICD-10-CM

## 2023-04-19 DIAGNOSIS — E860 Dehydration: Secondary | ICD-10-CM | POA: Diagnosis not present

## 2023-04-19 DIAGNOSIS — R001 Bradycardia, unspecified: Secondary | ICD-10-CM | POA: Diagnosis not present

## 2023-04-19 DIAGNOSIS — I2699 Other pulmonary embolism without acute cor pulmonale: Secondary | ICD-10-CM | POA: Diagnosis not present

## 2023-04-19 LAB — GLUCOSE, CAPILLARY
Glucose-Capillary: 117 mg/dL — ABNORMAL HIGH (ref 70–99)
Glucose-Capillary: 104 mg/dL — ABNORMAL HIGH (ref 70–99)
Glucose-Capillary: 98 mg/dL (ref 70–99)
Glucose-Capillary: 94 mg/dL (ref 70–99)

## 2023-04-19 LAB — CBC
HCT: 49.5 % (ref 39.0–52.0)
Hemoglobin: 16.9 g/dL (ref 13.0–17.0)
MCH: 29.7 pg (ref 26.0–34.0)
MCHC: 34.1 g/dL (ref 30.0–36.0)
MCV: 87 fL (ref 80.0–100.0)
Platelets: 175 10*3/uL (ref 150–400)
RBC: 5.69 MIL/uL (ref 4.22–5.81)
RDW: 14.4 % (ref 11.5–15.5)
WBC: 11.1 10*3/uL — ABNORMAL HIGH (ref 4.0–10.5)
nRBC: 0 % (ref 0.0–0.2)

## 2023-04-19 LAB — COMPREHENSIVE METABOLIC PANEL
ALT: 46 U/L — ABNORMAL HIGH (ref 0–44)
AST: 40 U/L (ref 15–41)
Albumin: 2.6 g/dL — ABNORMAL LOW (ref 3.5–5.0)
Alkaline Phosphatase: 48 U/L (ref 38–126)
Anion gap: 8 (ref 5–15)
BUN: 10 mg/dL (ref 8–23)
CO2: 23 mmol/L (ref 22–32)
Calcium: 8.2 mg/dL — ABNORMAL LOW (ref 8.9–10.3)
Chloride: 111 mmol/L (ref 98–111)
Creatinine, Ser: 0.79 mg/dL (ref 0.61–1.24)
GFR, Estimated: >60 mL/min (ref >60)
Glucose, Bld: 101 mg/dL — ABNORMAL HIGH (ref 70–99)
Potassium: 3.4 mmol/L — ABNORMAL LOW (ref 3.5–5.1)
Sodium: 142 mmol/L (ref 135–145)
Total Bilirubin: 1.4 mg/dL — ABNORMAL HIGH (ref 0.3–1.2)
Total Protein: 5.3 g/dL — ABNORMAL LOW (ref 6.5–8.1)

## 2023-04-19 MED ORDER — IOHEXOL 350 MG/ML SOLN
75.0000 mL | Freq: Once | INTRAVENOUS | Status: AC | PRN
  Administered 2023-04-19: 75 mL via INTRAVENOUS

## 2023-04-19 MED ORDER — IOHEXOL 9 MG/ML PO SOLN: 500.0000 mL | ORAL | Status: AC

## 2023-04-19 NOTE — Progress Notes (Signed)
   04/19/23 1307  Spiritual Encounters  Type of Visit Initial  Care provided to: Patient  Referral source Patient request  Reason for visit Advance directives  OnCall Visit No  Spiritual Framework  Presenting Themes Meaning/purpose/sources of inspiration;Goals in life/care;Values and beliefs;Significant life change;Impactful experiences and emotions;Courage hope and growth;Community and relationships  Values/beliefs patient believes in God, was recently baptized by a [facility] chaplain and has hope in the healing power of God  Community/Connection Family;Friend(s)  Patient Stress Factors Health changes  Family Stress Factors Not reviewed  Interventions  Spiritual Care Interventions Made Established relationship of care and support;Compassionate presence;Reflective listening;Normalization of emotions;Narrative/life review;Explored values/beliefs/practices/strengths;Meaning making;Encouragement  Intervention Outcomes  Outcomes Connection to spiritual care;Awareness of support;Reduced isolation;Reduced anxiety  Advance Directives (For Healthcare)  Does Patient Have a Medical Advance Directive? Yes  Does patient want to make changes to medical advance directive? Yes (Inpatient - patient requests chaplain consult to change a medical advance directive)  Type of Advance Directive Healthcare Power of [place];Living will   Chaplain visited with patient to determine if the paperwork was completed. Chaplain visited with patient who participated in life review. We discussed his spiritual beliefs. Patient shared that Chaplain Vernell Morgans baptized him while he was here in the hospital. Patient has a lot of hope regardless of what  his procedural outcomes might be.  Arlyce Dice, Chaplain Resident [phone]

## 2023-04-19 NOTE — Care Management Important Message (Signed)
Important Message  Patient Details  Name: Kishaun Erekson MRN: [id number] Date of Birth: 08/24/1945   Important Message Given:  Yes - Medicare IM     Sherilyn Banker 04/19/2023, 4:33 PM

## 2023-04-19 NOTE — Progress Notes (Signed)
Physical Therapy Treatment Patient Details Name: Samuel [name] MRN: [id number] DOB: May 17, 1946 Today's Date: 04/19/2023   History of Present Illness Patient is a 77 y/o male admitted 04/14/23 with SOB and dysphagia.  Found to have PE, asymptomatic bradycardia/second degree heart block considering PPM, and esophageal stenosis with plan for EGD.  PMH positive for DM, B knee arthroplasty.    PT Comments  Pt received in supine and agreeable to session. Pt slightly anxious and impulsive throughout session requiring cues for safety. Pt able to tolerate gait and stair trials with up to CGA and cues for safety. Pt demonstrating slight instability with cane support, but no overt LOB. Pt requests access to exercise machines and is educated that this is not possible, but is provided with an HEP to perform independently for ROM and strengthening. Pt continues to benefit from PT services to progress toward functional mobility goals.    If plan is discharge home, recommend the following: A little help with walking and/or transfers;Help with stairs or ramp for entrance;Assistance with cooking/housework;Assist for transportation   Can travel by private vehicle        Equipment Recommendations  None recommended by PT    Recommendations for Other Services       Precautions / Restrictions Precautions Precautions: Fall Restrictions Weight Bearing Restrictions: No     Mobility  Bed Mobility Overal bed mobility: Needs Assistance Bed Mobility: Supine to Sit, Sit to Supine     Supine to sit: Supervision, HOB elevated, Used rails Sit to supine: Supervision, Used rails   General bed mobility comments: increased time and cues for technique    Transfers Overall transfer level: Needs assistance Equipment used: Straight cane Transfers: Sit to/from Stand Sit to Stand: Contact guard assist           General transfer comment: From EOB with CGA for safety    Ambulation/Gait Ambulation/Gait  assistance: Contact guard assist, Supervision Gait Distance (Feet): 150 Feet Assistive device: Straight cane Gait Pattern/deviations: Step-through pattern, Decreased stride length, Trunk flexed       General Gait Details: Cues for pacing for safety due to pt appearing to rush back to room. Slight instability with SPC, but no LOB   Stairs Stairs: Yes Stairs assistance: Contact guard assist Stair Management: One rail Left, With cane Number of Stairs: 3 General stair comments: Cues for slower pace, cane placement, and step-to pattern due to increased difficulty with alternating pattern.     Balance Overall balance assessment: Needs assistance   Sitting balance-Leahy Scale: Good Sitting balance - Comments: sitting EOB   Standing balance support: Single extremity supported Standing balance-Leahy Scale: Fair Standing balance comment: with cane support                            Cognition Arousal: Alert Behavior During Therapy: WFL for tasks assessed/performed Overall Cognitive Status: Within Functional Limits for tasks assessed                                          Exercises      General Comments General comments (skin integrity, edema, etc.): Pt reporting slight lightheadedness throughout session, but is able to complete all  mobility tasks      Pertinent Vitals/Pain Pain Assessment Pain Assessment: Faces Faces Pain Scale: No hurt     PT Goals (current goals can now  be found in the care plan section) Acute Rehab PT Goals Patient Stated Goal: Return home and back to work PT Goal Formulation: With patient Time For Goal Achievement: 05/01/23 Progress towards PT goals: Progressing toward goals    Frequency    Min 1X/week       AM-PAC PT "6 Clicks" Mobility   Outcome Measure  Help needed turning from your back to your side while in a flat bed without using bedrails?: A Little Help needed moving from lying on your back to  sitting on the side of a flat bed without using bedrails?: A Little Help needed moving to and from a bed to a chair (including a wheelchair)?: A Little Help needed standing up from a chair using your arms (e.g., wheelchair or bedside chair)?: A Little Help needed to walk in hospital room?: A Little Help needed climbing 3-5 steps with a railing? : A Little 6 Click Score: 18    End of Session Equipment Utilized During Treatment: Gait belt Activity Tolerance: Patient tolerated treatment well Patient left: in bed;with call bell/phone within reach Nurse Communication: Mobility status PT Visit Diagnosis: Other abnormalities of gait and mobility (R26.89);Muscle weakness (generalized) (M62.81)     Time: 2130-8657 PT Time Calculation (min) (ACUTE ONLY): 17 min  Charges:    $Gait Training: 8-22 mins PT General Charges $$ ACUTE PT VISIT: 1 Visit                     Johny Shock, PTA Acute Rehabilitation Services Secure Chat Preferred  [phone]    Johny Shock 04/19/2023, 2:37 PM

## 2023-04-19 NOTE — Progress Notes (Signed)
[facility] Progress Note  SUBJECTIVE:   Interval history: Samuel [name] was seen and evaluated today at bedside. Resting in bed with friend at bedside. Noted having some upper abdominal discomfort. Currently NPO, has suction at bedside for secretions. Had brown bowel movement this AM. No chest pain. No shortness of breath.   Past Medical History:  Diagnosis Date   Diabetes mellitus without complication [facility])    Past Surgical History:  Procedure Laterality Date   BIOPSY  04/18/2023   Procedure: BIOPSY;  Surgeon: Lynann Bologna, DO;  Location: [facility] ENDOSCOPY;  Service: Gastroenterology;;   ESOPHAGOGASTRODUODENOSCOPY N/A 04/18/2023   Procedure: ESOPHAGOGASTRODUODENOSCOPY (EGD);  Surgeon: Lynann Bologna, DO;  Location: [facility] ENDOSCOPY;  Service: Gastroenterology;  Laterality: N/A;   KNEE ARTHROPLASTY Bilateral    Current Facility-Administered Medications  Medication Dose Route Frequency Provider Last Rate Last Admin   acetaminophen (TYLENOL) tablet 650 mg  650 mg Oral Q6H PRN Lynann Bologna, DO       Or   acetaminophen (TYLENOL) suppository 650 mg  650 mg Rectal Q6H PRN Liliane Shi H, DO       enoxaparin (LOVENOX) injection 90 mg  90 mg Subcutaneous Q12H Silvana Newness, RPH   90 mg at 04/19/23 1478   guaiFENesin (MUCINEX) 12 hr tablet 600 mg  600 mg Oral BID Liliane Shi H, DO       influenza vaccine adjuvanted (FLUAD) injection 0.5 mL  0.5 mL Intramuscular Tomorrow-1000 Liliane Shi H, DO       insulin aspart (novoLOG) injection 0-15 Units  0-15 Units Subcutaneous TID WC Daryana Whirley H, DO       iohexol (OMNIPAQUE) 9 MG/ML oral solution 500 mL  500 mL Oral Q1H Rhetta Mura, MD       pantoprazole (PROTONIX) injection 40 mg  40 mg Intravenous QHS Liliane Shi H, DO   40 mg at 04/18/23 2125   polyethylene glycol (MIRALAX / GLYCOLAX) packet 17 g  17 g Oral Daily PRN Lynann Bologna, DO       Allergies as of 04/14/2023 - Review Complete  04/14/2023  Allergen Reaction Noted   Penicillins  09/18/2011   Sulfa antibiotics  09/18/2011   Procaine Other (See Comments) 06/15/2015   Review of Systems:  Review of Systems  Respiratory:  Negative for shortness of breath.   Cardiovascular:  Negative for chest pain.  Gastrointestinal:  Positive for abdominal pain. Negative for blood in stool, nausea and vomiting.       Dysphagia.    OBJECTIVE:   Temp:  [97.7 F (36.5 C)-98.3 F (36.8 C)] 97.7 F (36.5 C) (10/16 1125) Pulse Rate:  [53-76] 76 (10/16 1125) Resp:  [15-19] 19 (10/16 1125) BP: (116-138)/(56-80) 137/80 (10/16 1125) SpO2:  [92 %-98 %] 92 % (10/16 1125) Last BM Date : 04/17/23 Physical Exam Constitutional:      General: He is not in acute distress.    Appearance: He is not ill-appearing, toxic-appearing or diaphoretic.  Cardiovascular:     Rate and Rhythm: Regular rhythm. Bradycardia present.  Pulmonary:     Effort: No respiratory distress.     Breath sounds: Normal breath sounds.  Abdominal:     General: Bowel sounds are normal. There is no distension.     Palpations: Abdomen is soft.     Tenderness: There is no abdominal tenderness. There is no guarding.  Musculoskeletal:     Right lower leg: No edema.     Left lower leg: No edema.  Skin:  General: Skin is warm and dry.  Neurological:     Mental Status: He is alert.     Labs: Recent Labs    04/17/23 0404 04/18/23 0449 04/19/23 0825  WBC 10.4 9.3 11.1*  HGB 15.4 16.4 16.9  HCT 46.0 49.4 49.5  PLT 149* 162 175   BMET Recent Labs    04/18/23 0449 04/19/23 0825  NA 141 142  K 3.2* 3.4*  CL 108 111  CO2 22 23  GLUCOSE 83 101*  BUN 13 10  CREATININE 1.09 0.79  CALCIUM 8.4* 8.2*   LFT Recent Labs    04/19/23 0825  PROT 5.3*  ALBUMIN 2.6*  AST 40  ALT 46*  ALKPHOS 48  BILITOT 1.4*   PT/INR No results for input(s): "LABPROT", "INR" in the last 72 hours. Diagnostic imaging: No results found.  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  -Status post biopsy on EGD 04/18/23, results pending Progressive dysphagia secondary to above Hiatal hernia Duodenitis  Pulmonary emboli, on heparin  Bradycardia, intermittent Mobitz type II AV block Diabetes mellitus  PLAN: -Follow up biopsy results  -Appreciate Oncology input -Need to determine best option forward for nutrition, NG tube would not able to be passed given anatomy seen on EGD 10/15, PEG/PEJ would be optimal for enteral nutrition, TPN is also choice though would use enteral nutrition if able -On heparin for pulmonary emboli -[facility] will follow   LOS: 5 days   Liliane Shi, [facility]

## 2023-04-19 NOTE — Progress Notes (Signed)
Rounding Note    Patient Name: Samuel [name] Date of Encounter: 04/19/2023  [facility] Cardiologist: [name], MD   Subjective   Mentions he would be open to any and all management recommendations for his esophageal malignancy, including any surgical options  Inpatient Medications    Scheduled Meds:  enoxaparin (LOVENOX) injection  90 mg Subcutaneous Q12H   guaiFENesin  600 mg Oral BID   influenza vaccine adjuvanted  0.5 mL Intramuscular Tomorrow-1000   insulin aspart  0-15 Units Subcutaneous TID WC   pantoprazole (PROTONIX) IV  40 mg Intravenous QHS   Continuous Infusions:   PRN Meds: acetaminophen **OR** acetaminophen, polyethylene glycol   Vital Signs    Vitals:   04/18/23 2029 04/18/23 2247 04/19/23 0359 04/19/23 0733  BP: (!) 116/56 136/71 138/69 136/76  Pulse: 70 (!) 53 74 72  Resp: 15 17 18 15   Temp: 98.3 F (36.8 C) 98 F (36.7 C) 98.1 F (36.7 C) 97.7 F (36.5 C)  TempSrc: Oral Oral Oral Oral  SpO2: 96% 98% 94% 93%  Weight:      Height:        Intake/Output Summary (Last 24 hours) at 04/19/2023 1046 Last data filed at 04/19/2023 9528 Gross per 24 hour  Intake 1609.46 ml  Output 600 ml  Net 1009.46 ml      04/18/2023    9:40 AM 04/14/2023   12:47 PM 12/15/2022   10:13 AM  Last 3 Weights  Weight (lbs) 203 lb 14.8 oz 203 lb 14.8 oz 204 lb  Weight (kg) 92.5 kg 92.5 kg 92.534 kg      Telemetry    SR/SB 50's-60's with intermittent 2:1 AVblock 29-30's slower rates overnight with sleep, did have at least on 2 occasions what looks like 2:1 with non conducted PACs more concerning for high degree of AV block transiently  - Personally Reviewed  ECG    No new EKGs - Personally Reviewed  Physical Exam   Exam is unchanged GEN: No acute distress.   Neck: No JVD Cardiac: RRR, no murmurs, rubs, or gallops.  Respiratory: CTA b/l. GI: Soft, nontender  MS: No edema; No deformity. Neuro:  Nonfocal  Psych: Normal affect   Labs     High Sensitivity Troponin:   Recent Labs  Lab 04/14/23 1416 04/14/23 1752 04/16/23 0101  TROPONINIHS 56* 67* 67*     Chemistry Recent Labs  Lab 04/15/23 0302 04/15/23 1028 04/16/23 0101 04/16/23 0605 04/18/23 0449  NA 138  --  140 139 141  K 3.5  --  3.6 3.6 3.2*  CL 106  --  107 106 108  CO2 23  --  23 23 22   GLUCOSE 82  --  82 78 83  BUN 20  --  13 13 13   CREATININE 0.77  --  0.83 0.89 1.09  CALCIUM 8.1*  --  8.5* 8.3* 8.4*  MG  --  2.4 2.2  --  2.3  PROT 5.2*  --   --  4.7* 5.0*  ALBUMIN 2.9*  --   --  2.5* 2.5*  AST 55*  --   --  50* 46*  ALT 66*  --   --  58* 53*  ALKPHOS 48  --   --  42 48  BILITOT 1.9*  --   --  1.5* 1.6*  GFRNONAA >60  --  >60 >60 >60  ANIONGAP 9  --  10 10 11     Lipids No results for input(s): "  CHOL", "TRIG", "HDL", "LABVLDL", "LDLCALC", "CHOLHDL" in the last 168 hours.  Hematology Recent Labs  Lab 04/17/23 0404 04/18/23 0449 04/19/23 0825  WBC 10.4 9.3 11.1*  RBC 5.33 5.62 5.69  HGB 15.4 16.4 16.9  HCT 46.0 49.4 49.5  MCV 86.3 87.9 87.0  MCH 28.9 29.2 29.7  MCHC 33.5 33.2 34.1  RDW 13.6 13.9 14.4  PLT 149* 162 175   Thyroid  Recent Labs  Lab 04/15/23 1750  TSH 0.242*  FREET4 1.28*    BNPNo results for input(s): "BNP", "PROBNP" in the last 168 hours.  DDimer No results for input(s): "DDIMER" in the last 168 hours.   Radiology    No results found.  Cardiac Studies   04/15/23: TTE  1. Left ventricular ejection fraction, by estimation, is 55%. The left  ventricle has normal function. The left ventricle has no regional wall  motion abnormalities. Left ventricular diastolic parameters are consistent  with Grade I diastolic dysfunction  (impaired relaxation).   2. Right ventricular systolic function is normal. The right ventricular  size is normal. Tricuspid regurgitation signal is inadequate for assessing  PA pressure.   3. The mitral valve is normal in structure. Trivial mitral valve  regurgitation. No evidence of  mitral stenosis.   4. The aortic valve is grossly normal. Aortic valve regurgitation is mild  to moderate. Aortic valve sclerosis is present, with no evidence of aortic  valve stenosis.   5. The inferior vena cava is normal in size with greater than 50%  respiratory variability, suggesting right atrial pressure of 3 mmHg.   Patient Profile     77 y.o. male w/PMHx of DM admitted with progressive dysphagia to the point of unable to eat/drink anything, pleuritic CP  Found with acute b/l DVTs and PE >> heparin incidentally noted to have intermittent 2:1 AV block Long segment of esophageal stenosis/stricture  Assessment & Plan    Intermittent Mobitz II AVblock w/ 2:1 conduction No nodal blocking agents  He ambulated 10/14 and reported once he got started felt he did well, without lightheadedness dizziness. HR rose to the 70's  Continues to be able to raise HRs with just talking, moving in bed   Dr. Jimmey Ralph has seen the patient:  he has had now evidence of transient higher AV  block, without any obvious or new symptoms  Will wait to get a clearer picture of management strategy for esophageal malignancy prior to decisions on PPM (or not)  Will need to be off lovenox getting anticoagulation PO (if able to be stented) or per tube prior to PPM       As per attending team:   # acute PE #acute b/l LE DVTs On therapeutic lovenox  # progressive dysphagia     Esophageal stenosis #gallstones/sludge  S/p EGD w/malignant appearing intrinsic severe stenosis was found 30 to 34 cm from incisors (biopsied > pending) Oncology brought on board   ? R sternoclavicular swelling ?sclerotic lesion T2 noted on CT Pending evaluation for any potential METS and his biopsy result (in my understandinhg of oncology note)     For questions or updates, please contact [facility] Please consult [email or web address] for contact info under        Signed, Sheilah Pigeon, PA-C  04/19/2023, 10:46  AM

## 2023-04-19 NOTE — Progress Notes (Signed)
PROGRESS NOTE    Samuel [name]  RJJ:[id number] DOB: [birth year] DOA: 04/14/2023 PCP: [name], MD   Brief Narrative:  Patient 77 year old white male with insidious onset of dysphagia, initially solids transitioning to liquids approximately 2 weeks ago now unable to tolerate any p.o. without profound pleuritic chest pain.  Initial imaging remarkable for right-sided PE, EKG showing secondary AV block with symptomatic bradycardia ultimately transferred to [facility] after requiring atropine for evaluation by cardiology/EP for possible pacemaker evaluation.  10/13 symptomatic bradycardia into the 30s  10/14 barium swallow shows distal stenosis with prestenotic dilatation 10/15 malignant appearing esophageal stenosis on EGD, biopsies taken 10/16 CT abdomen pelvis pending for possible percutaneous gastric tube placement, TPN initiated in the interim  Assessment & Plan:   Principal Problem:   Acute pulmonary embolism (HCC) Active Problems:   Dysphagia   Dehydration   LFT elevation   Swelling of clavicular region   Pulmonary embolism (HCC)   Troponin I above reference range   Bradycardia   Dysphagia-likely malignant stricture -EGD completed - biopsy pending - imaging concerning for malignancy -Patient is not a candidate for esophageal stent or NG tube placement given stricture/anatomy  -Ultimate plan for PEG tube placement, likely percutaneous per IR -TPN in the interim if delaying PEG tube placement -CT abdomen pelvis pending today for further evaluation and staging -Oncology aware, appreciate guidance with staging and imaging -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  plan for neoadjuvant chemo and radiation with ultimate goal of esophagectomy  Acute PE with right heart strain -Transitioning from heparin drip to Lovenox for possible need for procedure, likely transition to DOAC at discharge -Presumed to be provoked secondary to questionable malignancy as above   Intermittent Mobitz  II AV block , 2:1 which is responsive to ambulation -Asymptomatic, echo shows normal ventricular function  -EP consult concurs no established need for permanent pacemaker -if unstable recommendations for medical management with atropine, dopamine or epinephrine as well as temporary pacing wire would be reasonable  Mild hypokalemia Magnesium is okay-IV runs are causing pain-placed on D5 with 20 of K for 16 hours and then discontinue based on labs   Prediabetes A1c 6.2 Metformin allegedly causes diarrhea-had some low blood sugars overnight so placing on D5 until PEG can be placed   Subclinical hyperthyroidism TSH depressed, T4 within normal limits Follow outpatient for repeat labs   Hyperbilirubinemia, elevated AST/ALT  Asymptomatic, ultrasound remarkable for sludge and questionable gallstones No indication for surgery given minimal to no symptoms Discontinue antibiotics  DVT prophylaxis: SCDs Start: 04/14/23 2012 Code Status:   Code Status: Full Code Family Communication: At bedside  Status is: Inpatient  Dispo: The patient is from: Home              Anticipated d/c is to: Home              Anticipated d/c date is: 40 to 72 hours              Patient currently not medically stable for discharge  Consultants:  GI, oncology, IR, GenSx, cardiology  Procedures:  EGD  Antimicrobials:  None  Subjective: No acute issues or events overnight, remains in good spirits denies headache fevers chills nausea vomiting diarrhea constipation  Objective: Vitals:   04/18/23 2029 04/18/23 2247 04/19/23 0359 04/19/23 0733  BP: (!) 116/56 136/71 138/69 136/76  Pulse: 70 (!) 53 74 72  Resp: 15 17 18 15   Temp: 98.3 F (36.8 C) 98 F (36.7 C) 98.1 F (36.7 C) 97.7  F (36.5 C)  TempSrc: Oral Oral Oral Oral  SpO2: 96% 98% 94% 93%  Weight:      Height:        Intake/Output Summary (Last 24 hours) at 04/19/2023 0734 Last data filed at 04/19/2023 1610 Gross per 24 hour  Intake 1609.46  ml  Output 600 ml  Net 1009.46 ml   Filed Weights   04/14/23 1247 04/18/23 0940  Weight: 92.5 kg 92.5 kg    Examination:  General exam: Appears calm and comfortable  Respiratory system: Clear to auscultation. Respiratory effort normal. Cardiovascular system: S1 & S2 heard, RRR. No JVD, murmurs, rubs, gallops or clicks. No pedal edema. Gastrointestinal system: Abdomen is nondistended, soft and nontender. No organomegaly or masses felt. Normal bowel sounds heard. Central nervous system: Alert and oriented. No focal neurological deficits. Extremities: Symmetric 5 x 5 power. Skin: No rashes, lesions or ulcers Psychiatry: Judgement and insight appear normal. Mood & affect appropriate.     Data Reviewed: I have personally reviewed following labs and imaging studies  CBC: Recent Labs  Lab 04/14/23 1416 04/15/23 0302 04/16/23 0605 04/17/23 0404 04/18/23 0449  WBC 13.2* 11.0* 9.9 10.4 9.3  NEUTROABS 9.4*  --   --   --   --   HGB 18.2* 15.2 15.3 15.4 16.4  HCT 53.8* 45.5 45.6 46.0 49.4  MCV 86.9 88.9 87.5 86.3 87.9  PLT 211 164 153 149* 162   Basic Metabolic Panel: Recent Labs  Lab 04/14/23 1416 04/15/23 0302 04/15/23 1028 04/16/23 0101 04/16/23 0605 04/18/23 0449  NA 137 138  --  140 139 141  K 3.5 3.5  --  3.6 3.6 3.2*  CL 101 106  --  107 106 108  CO2 21* 23  --  23 23 22   GLUCOSE 108* 82  --  82 78 83  BUN 26* 20  --  13 13 13   CREATININE 0.93 0.77  --  0.83 0.89 1.09  CALCIUM 9.4 8.1*  --  8.5* 8.3* 8.4*  MG  --   --  2.4 2.2  --  2.3   GFR: Estimated Creatinine Clearance: 60.4 mL/min (by C-G formula based on SCr of 1.09 mg/dL). Liver Function Tests: Recent Labs  Lab 04/14/23 1416 04/15/23 0302 04/16/23 0605 04/18/23 0449  AST 82* 55* 50* 46*  ALT 91* 66* 58* 53*  ALKPHOS 57 48 42 48  BILITOT 3.0* 1.9* 1.5* 1.6*  PROT 6.8 5.2* 4.7* 5.0*  ALBUMIN 3.7 2.9* 2.5* 2.5*   Recent Labs  Lab 04/14/23 1416  LIPASE 41   No results for input(s): "AMMONIA"  in the last 168 hours. Coagulation Profile: Recent Labs  Lab 04/15/23 0302  INR 1.5*   Cardiac Enzymes: No results for input(s): "CKTOTAL", "CKMB", "CKMBINDEX", "TROPONINI" in the last 168 hours. BNP (last 3 results) No results for input(s): "PROBNP" in the last 8760 hours. HbA1C: No results for input(s): "HGBA1C" in the last 72 hours. CBG: Recent Labs  Lab 04/18/23 0559 04/18/23 1204 04/18/23 1702 04/18/23 2032 04/19/23 0539  GLUCAP 74 87 94 106* 117*   Lipid Profile: No results for input(s): "CHOL", "HDL", "LDLCALC", "TRIG", "CHOLHDL", "LDLDIRECT" in the last 72 hours. Thyroid Function Tests: No results for input(s): "TSH", "T4TOTAL", "FREET4", "T3FREE", "THYROIDAB" in the last 72 hours. Anemia Panel: No results for input(s): "VITAMINB12", "FOLATE", "FERRITIN", "TIBC", "IRON", "RETICCTPCT" in the last 72 hours. Sepsis Labs: No results for input(s): "PROCALCITON", "LATICACIDVEN" in the last 168 hours.  No results found for this or any  previous visit (from the past 240 hour(s)).   Radiology Studies: DG ESOPHAGUS W SINGLE CM (SOL OR THIN BA)  Result Date: 04/17/2023 CLINICAL DATA:  77 year old male with trouble swallowing both liquid and solid food. EXAM: ESOPHAGUS/BARIUM SWALLOW/TABLET STUDY TECHNIQUE: Single contrast examination was performed using thin liquid barium. This exam was performed by Loyce Dys PA-C, and was supervised and interpreted by Roanna Banning, MD. FLUOROSCOPY: Radiation Exposure Index (as provided by the fluoroscopic device): 13 mGy Kerma COMPARISON:  CT CHEST 04/14/2023. FINDINGS: Swallowing: Appears normal. No vestibular penetration or aspiration seen. Pharynx:  No masses, lesions or ulcers observed. Esophagus: Abrupt tapering in the thoracic esophagus with only a trickle of barium observed to intermittently pass into the stomach. Unable to assess for motility, hiatal hernia, and gastroesophageal reflux. 13mm barium tablet was not given. IMPRESSION:  Long-segment distal esophageal stenosis, measuring ~5 cm in length, with resulting pre stenotic esophageal distention. Correlate with direct endoscopic visualization. Electronically Signed   By: Roanna Banning M.D.   On: 04/17/2023 10:24    Scheduled Meds:  enoxaparin (LOVENOX) injection  90 mg Subcutaneous Q12H   guaiFENesin  600 mg Oral BID   influenza vaccine adjuvanted  0.5 mL Intramuscular Tomorrow-1000   insulin aspart  0-15 Units Subcutaneous TID WC   pantoprazole (PROTONIX) IV  40 mg Intravenous QHS   Continuous Infusions:   LOS: 5 days   Time spent:  Azucena Fallen, DO [facility]  If 7PM-7AM, please contact night-coverage [email or web address]  04/19/2023, 7:34 AM

## 2023-04-19 NOTE — Progress Notes (Signed)
   IR aware of request for percutaneous gastric tube placement  CT Abd/pelvis ordered per MD IR waiting for result to evaluate anatomy for possible G tube candidacy.

## 2023-04-19 NOTE — Progress Notes (Signed)
   04/19/23 1554  Spiritual Encounters  Type of Visit Attempt (pt unavailable)  Conversation partners present during encounter Nurse  Reason for visit Advance directives  OnCall Visit No   10/16@3 :25 attempted notarization - witnesses, chaplain and notary present -  patient went to Xray - will attempt again in morning

## 2023-04-20 ENCOUNTER — Inpatient Hospital Stay (HOSPITAL_COMMUNITY): Payer: 59

## 2023-04-20 DIAGNOSIS — C159 Malignant neoplasm of esophagus, unspecified: Secondary | ICD-10-CM

## 2023-04-20 DIAGNOSIS — R1319 Other dysphagia: Secondary | ICD-10-CM | POA: Diagnosis not present

## 2023-04-20 DIAGNOSIS — E860 Dehydration: Secondary | ICD-10-CM | POA: Diagnosis not present

## 2023-04-20 DIAGNOSIS — C155 Malignant neoplasm of lower third of esophagus: Secondary | ICD-10-CM | POA: Diagnosis not present

## 2023-04-20 DIAGNOSIS — I2699 Other pulmonary embolism without acute cor pulmonale: Secondary | ICD-10-CM | POA: Diagnosis not present

## 2023-04-20 DIAGNOSIS — R001 Bradycardia, unspecified: Secondary | ICD-10-CM | POA: Diagnosis not present

## 2023-04-20 HISTORY — PX: IR GASTROSTOMY TUBE MOD SED: IMG625

## 2023-04-20 HISTORY — PX: IR NASO G TUBE PLC W/FL W/RAD: IMG2321

## 2023-04-20 LAB — GLUCOSE, CAPILLARY
Glucose-Capillary: 85 mg/dL (ref 70–99)
Glucose-Capillary: 141 mg/dL — ABNORMAL HIGH (ref 70–99)
Glucose-Capillary: 82 mg/dL (ref 70–99)
Glucose-Capillary: 116 mg/dL — ABNORMAL HIGH (ref 70–99)

## 2023-04-20 LAB — COMPREHENSIVE METABOLIC PANEL
ALT: 41 U/L (ref 0–44)
AST: 37 U/L (ref 15–41)
Albumin: 2.4 g/dL — ABNORMAL LOW (ref 3.5–5.0)
Alkaline Phosphatase: 46 U/L (ref 38–126)
Anion gap: 13 (ref 5–15)
BUN: 8 mg/dL (ref 8–23)
CO2: 23 mmol/L (ref 22–32)
Calcium: 9.1 mg/dL (ref 8.9–10.3)
Chloride: 108 mmol/L (ref 98–111)
Creatinine, Ser: 0.75 mg/dL (ref 0.61–1.24)
GFR, Estimated: >60 mL/min (ref >60)
Glucose, Bld: 83 mg/dL (ref 70–99)
Potassium: 3.5 mmol/L (ref 3.5–5.1)
Sodium: 144 mmol/L (ref 135–145)
Total Bilirubin: 1.2 mg/dL (ref 0.3–1.2)
Total Protein: 4.9 g/dL — ABNORMAL LOW (ref 6.5–8.1)

## 2023-04-20 LAB — PROTIME-INR
INR: 1.4 — ABNORMAL HIGH (ref 0.8–1.2)
Prothrombin Time: 16.9 s — ABNORMAL HIGH (ref 11.4–15.2)

## 2023-04-20 LAB — SURGICAL PATHOLOGY

## 2023-04-20 MED ORDER — GUAIFENESIN 100 MG/5ML PO LIQD
30.0000 mL | Freq: Two times a day (BID) | ORAL | Status: DC
  Administered 2023-04-20 – 2023-04-25 (×10): 30 mL
  Filled 2023-04-20 (×10): qty 30

## 2023-04-20 MED ORDER — FENTANYL CITRATE (PF) 100 MCG/2ML IJ SOLN
INTRAMUSCULAR | Status: AC | PRN
Start: 2023-04-20 — End: 2023-04-20
  Administered 2023-04-20: 50 ug via INTRAVENOUS

## 2023-04-20 MED ORDER — GLUCAGON HCL RDNA (DIAGNOSTIC) 1 MG IJ SOLR
INTRAMUSCULAR | Status: AC | PRN
  Administered 2023-04-20: .5 mg via INTRAVENOUS

## 2023-04-20 MED ORDER — LIDOCAINE-EPINEPHRINE 1 %-1:100000 IJ SOLN
INTRAMUSCULAR | Status: AC
  Filled 2023-04-20: qty 1

## 2023-04-20 MED ORDER — LIDOCAINE VISCOUS HCL 2 % MT SOLN
15.0000 mL | OROMUCOSAL | Status: AC
  Filled 2023-04-20: qty 15

## 2023-04-20 MED ORDER — FENTANYL CITRATE (PF) 100 MCG/2ML IJ SOLN
INTRAMUSCULAR | Status: AC
  Filled 2023-04-20: qty 2

## 2023-04-20 MED ORDER — VANCOMYCIN HCL IN DEXTROSE 1-5 GM/200ML-% IV SOLN
INTRAVENOUS | Status: AC
  Filled 2023-04-20: qty 200

## 2023-04-20 MED ORDER — MIDAZOLAM HCL 2 MG/2ML IJ SOLN
INTRAMUSCULAR | Status: AC
  Filled 2023-04-20: qty 2

## 2023-04-20 MED ORDER — ACETAMINOPHEN 650 MG RE SUPP: 650.0000 mg | Freq: Four times a day (QID) | RECTAL | Status: DC | PRN

## 2023-04-20 MED ORDER — ENOXAPARIN SODIUM 100 MG/ML IJ SOSY: 90.0000 mg | PREFILLED_SYRINGE | Freq: Two times a day (BID) | INTRAMUSCULAR | Status: DC

## 2023-04-20 MED ORDER — VANCOMYCIN HCL IN DEXTROSE 1-5 GM/200ML-% IV SOLN
1000.0000 mg | INTRAVENOUS | Status: AC
  Administered 2023-04-20: 1000 mg via INTRAVENOUS

## 2023-04-20 MED ORDER — ACETAMINOPHEN 325 MG PO TABS
650.0000 mg | ORAL_TABLET | Freq: Four times a day (QID) | ORAL | Status: DC | PRN
  Administered 2023-04-21 – 2023-04-24 (×2): 650 mg
  Filled 2023-04-20 (×2): qty 2

## 2023-04-20 MED ORDER — MIDAZOLAM HCL 2 MG/2ML IJ SOLN
INTRAMUSCULAR | Status: AC | PRN
Start: 2023-04-20 — End: 2023-04-20
  Administered 2023-04-20: 1 mg via INTRAVENOUS

## 2023-04-20 MED ORDER — APIXABAN 5 MG PO TABS: 5.0000 mg | ORAL_TABLET | Freq: Two times a day (BID) | ORAL | Status: DC

## 2023-04-20 MED ORDER — OSMOLITE 1.2 CAL PO LIQD
1000.0000 mL | ORAL | Status: DC
  Administered 2023-04-20: 1000 mL
  Filled 2023-04-20 (×2): qty 1000

## 2023-04-20 MED ORDER — APIXABAN 5 MG PO TABS
5.0000 mg | ORAL_TABLET | Freq: Two times a day (BID) | ORAL | Status: DC
  Administered 2023-04-21 – 2023-04-25 (×8): 5 mg
  Filled 2023-04-20 (×9): qty 1

## 2023-04-20 MED ORDER — GLUCAGON HCL RDNA (DIAGNOSTIC) 1 MG IJ SOLR
INTRAMUSCULAR | Status: AC
  Filled 2023-04-20: qty 1

## 2023-04-20 MED ORDER — LIDOCAINE VISCOUS HCL 2 % MT SOLN
OROMUCOSAL | Status: AC
  Filled 2023-04-20: qty 15

## 2023-04-20 MED ORDER — POLYETHYLENE GLYCOL 3350 17 G PO PACK: 17.0000 g | PACK | Freq: Every day | ORAL | Status: DC | PRN

## 2023-04-20 MED ORDER — LIDOCAINE-EPINEPHRINE 1 %-1:100000 IJ SOLN
30.0000 mL | Freq: Once | INTRAMUSCULAR | Status: AC
  Administered 2023-04-20: 20 mL via INTRADERMAL

## 2023-04-20 NOTE — Progress Notes (Signed)
   04/20/23 1142  Spiritual Encounters  Type of Visit Follow up  Conversation partners present during encounter Nurse  Reason for visit Advance directives  OnCall Visit No   10/17@11 :30 Third attempt made - patient not available - getting feeding tube - will attempt again this afternoon

## 2023-04-20 NOTE — Progress Notes (Signed)
Mobility Specialist Progress Note:    04/20/23 0957  Mobility  Activity Ambulated with assistance in hallway  Level of Assistance Contact guard assist, steadying assist  Assistive Device Cane  Distance Ambulated (ft) 220 ft  Range of Motion/Exercises Active;All extremities  Activity Response Tolerated well  Mobility Referral Yes  $Mobility charge 1 Mobility  Mobility Specialist Start Time (ACUTE ONLY) 0957  Mobility Specialist Stop Time (ACUTE ONLY) 1011  Mobility Specialist Time Calculation (min) (ACUTE ONLY) 14 min   Pt received in bed, eager for mobility session. Pt sat EOB without assistance, CGA during ambulation with gait belt. Cane used as AD. Tolerated well, asx throughout. Returned pt to room, transport at bedside. All needs met.   Pre Mobility: HR 64 bpm During Mobility: HR 107 bpm Post Mobility: 100 bpm  Feliciana Rossetti Mobility Specialist Please contact via Special educational needs teacher or  Rehab office at [phone]

## 2023-04-20 NOTE — Progress Notes (Signed)
   04/20/23 1500  Spiritual Encounters  Type of Visit Follow up  Care provided to: Pt and family  Conversation partners present during encounter Nurse  Reason for visit Advance directives  OnCall Visit No   Completed HCPOA

## 2023-04-20 NOTE — Progress Notes (Signed)
BRIEF PROGRESS NOTE:   Patient down to PEG tube at time of my presentation to room for evaluation. Patient friend/neighbor, Floydene Flock, present at bedside, have previously discussed patient care in front of her with patient permission. Informed of biopsy results showing esophageal cancer. Further recommendations from Oncology pending. Cardiology recommending permanent pacemaker placement, though needs PEG tube first for medication administration. Will follow up with patient tomorrow after his PEG tube placement.   Liliane Shi, DO [facility]

## 2023-04-20 NOTE — Progress Notes (Addendum)
PROGRESS NOTE    Samuel [name]  HYQ:[id number] DOB: [birth year] DOA: 04/14/2023 PCP: [name], MD   Brief Narrative:  Patient 77 year old white male with insidious onset of dysphagia, initially solids transitioning to liquids approximately 2 weeks ago now unable to tolerate any p.o. without profound pleuritic chest pain.  Initial imaging remarkable for right-sided PE, EKG showing secondary AV block with symptomatic bradycardia ultimately transferred to [facility] after requiring atropine for evaluation by cardiology/EP for possible pacemaker evaluation.  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, biopsies taken 10/16 CT abdomen pelvis pending for possible percutaneous gastric tube placement, TPN initiated in the interim 10/17 G-tube placement per IR  Assessment & Plan:   Principal Problem:   Acute pulmonary embolism [facility]) Active Problems:   Dysphagia   Dehydration   LFT elevation   Swelling of clavicular region   Pulmonary embolism (HCC)   Troponin I above reference range   Bradycardia   AV block, Mobitz II  Adenocarcinoma of the esophagus Concurrent profound dysphagia -Biopsy confirms intramucosal adenocarcinoma -Patient is not a candidate for esophageal stent or NG tube placement given stricture/anatomy  -G-tube placement today 10/17 per IR -CT abdomen without clear evidence for metastatic disease of the abdomen or pelvis, notable indeterminate focus of the L5 vertebral body but otherwise notable pancreatic and renal cyst -Oncology following, plan for neoadjuvant chemo and radiation with ultimate goal of esophagectomy  Acute PE with right heart strain -Transitioning from heparin drip to Lovenox for possible need for procedure, likely transition to DOAC at discharge -Presumed to be provoked secondary to questionable malignancy as above    Intermittent Mobitz II AV block , 2:1 which is responsive to ambulation -Asymptomatic, echo shows normal ventricular function  -EP consult confirms Mobitz II meets criteria for permanent pacing - position will likely depend on possible radiation needs given above - if unstable recommendations for medical management with atropine, dopamine or epinephrine as well as temporary pacing wire would be reasonable  Mild hypokalemia, resolved Continue to follow   Prediabetes A1c 6.2 Metformin allegedly causes diarrhea Initiate tube feeds to avoid hypoglycemia once approved   Subclinical hyperthyroidism TSH depressed, T4 within normal limits Follow outpatient for repeat labs   Hyperbilirubinemia, elevated AST/ALT  Asymptomatic, ultrasound remarkable for sludge and questionable gallstones No indication for surgery given minimal to no symptoms Discontinue antibiotics  DVT prophylaxis: SCDs Start: 04/14/23 2012 Code Status:   Code Status: Full Code Family Communication: At bedside  Status is: Inpatient  Dispo: The patient is from: Home              Anticipated d/c is to: Home              Anticipated d/c date is: 48 to 72 hours              Patient currently not medically stable for discharge  Consultants:  GI, oncology, IR, GenSx, cardiology  Procedures:  EGD  Antimicrobials:  None  Subjective: No acute issues or events overnight, remains in good spirits denies headache fevers chills nausea vomiting diarrhea constipation  Objective: Vitals:   04/19/23 1125 04/19/23 1557 04/19/23 2000 04/20/23 0422  BP: 137/80 (!) 146/78 (!) 145/76 132/71  Pulse: 76 63 60 (!) 48  Resp: 19 20 12 19   Temp: 97.7 F (36.5 C) 98 F (36.7 C) 98 F (36.7 C) 98 F (36.7 C)  TempSrc: Oral  Oral  Oral  SpO2: 92% 94% 97% 98%  Weight:      Height:       No intake or output data in the 24 hours ending 04/20/23 0745  Filed Weights   04/14/23 1247 04/18/23 0940  Weight: 92.5 kg 92.5 kg     Examination:  General exam: Appears calm and comfortable  Respiratory system: Clear to auscultation. Respiratory effort normal. Cardiovascular system: S1 & S2 heard, RRR. No JVD, murmurs, rubs, gallops or clicks. No pedal edema. Gastrointestinal system: Abdomen is nondistended, soft and nontender. No organomegaly or masses felt. Normal bowel sounds heard. Central nervous system: Alert and oriented. No focal neurological deficits. Extremities: Symmetric 5 x 5 power. Skin: No rashes, lesions or ulcers Psychiatry: Judgement and insight appear normal. Mood & affect appropriate.    Data Reviewed: I have personally reviewed following labs and imaging studies  CBC: Recent Labs  Lab 04/14/23 1416 04/15/23 0302 04/16/23 0605 04/17/23 0404 04/18/23 0449 04/19/23 0825  WBC 13.2* 11.0* 9.9 10.4 9.3 11.1*  NEUTROABS 9.4*  --   --   --   --   --   HGB 18.2* 15.2 15.3 15.4 16.4 16.9  HCT 53.8* 45.5 45.6 46.0 49.4 49.5  MCV 86.9 88.9 87.5 86.3 87.9 87.0  PLT 211 164 153 149* 162 175   Basic Metabolic Panel: Recent Labs  Lab 04/15/23 1028 04/16/23 0101 04/16/23 0605 04/18/23 0449 04/19/23 0825 04/20/23 0315  NA  --  140 139 141 142 144  K  --  3.6 3.6 3.2* 3.4* 3.5  CL  --  107 106 108 111 108  CO2  --  23 23 22 23 23   GLUCOSE  --  82 78 83 101* 83  BUN  --  13 13 13 10 8   CREATININE  --  0.83 0.89 1.09 0.79 0.75  CALCIUM  --  8.5* 8.3* 8.4* 8.2* 9.1  MG 2.4 2.2  --  2.3  --   --    GFR: Estimated Creatinine Clearance: 82.4 mL/min (by C-G formula based on SCr of 0.75 mg/dL). Liver Function Tests: Recent Labs  Lab 04/15/23 0302 04/16/23 0605 04/18/23 0449 04/19/23 0825 04/20/23 0315  AST 55* 50* 46* 40 37  ALT 66* 58* 53* 46* 41  ALKPHOS 48 42 48 48 46  BILITOT 1.9* 1.5* 1.6* 1.4* 1.2  PROT 5.2* 4.7* 5.0* 5.3* 4.9*  ALBUMIN 2.9* 2.5* 2.5* 2.6* 2.4*   Recent Labs  Lab 04/14/23 1416  LIPASE 41   Coagulation Profile: Recent Labs  Lab 04/15/23 0302  INR 1.5*    CBG: Recent Labs  Lab 04/19/23 0539 04/19/23 1121 04/19/23 1607 04/19/23 2106 04/20/23 0550  GLUCAP 117* 104* 98 94 85    No results found for this or any previous visit (from the past 240 hour(s)).   Radiology Studies: CT ABDOMEN PELVIS W CONTRAST  Result Date: 04/19/2023 CLINICAL DATA:  Colon cancer metastatic disease. EXAM: CT ABDOMEN AND PELVIS WITH CONTRAST TECHNIQUE: Multidetector CT imaging of the abdomen and pelvis was performed using the standard protocol following bolus administration of intravenous contrast. RADIATION DOSE REDUCTION: This exam was performed according to the departmental dose-optimization program which includes automated exposure control, adjustment of the mA and/or kV according to patient size and/or use of iterative reconstruction technique. CONTRAST:  75mL OMNIPAQUE IOHEXOL 350 MG/ML SOLN COMPARISON:  None. FINDINGS: Lower chest: There are trace bilateral pleural effusions with bibasilar atelectasis. Hepatobiliary: Small gallstone is present. There is no biliary ductal dilatation. There is  fatty infiltration of the liver. No focal liver lesions are seen. Pancreas: There is a low-attenuation/cystic area in the pancreatic head measuring 1.7 x 1.2 cm. There is mild diffuse pancreatic atrophy. There is no pancreatic ductal dilatation or surrounding inflammation. Spleen: Normal in size without focal abnormality. Adrenals/Urinary Tract: There is a lobulated left renal cyst measuring 8.1 x 6.1 cm. Otherwise, the kidneys, adrenal glands and bladder are within normal limits. Stomach/Bowel: Stomach is within normal limits. Appendix appears normal. No evidence of bowel wall thickening, distention, or inflammatory changes. Focal colonic mass not visualized on this study. There is diffuse colonic diverticulosis. There is wall thickening of the distal esophagus with mild surrounding inflammation. Vascular/Lymphatic: Aortic atherosclerosis. No enlarged abdominal or pelvic lymph  nodes. Reproductive: Prostate gland is enlarged. Other: No abdominal wall hernia or abnormality. No abdominopelvic ascites. Musculoskeletal: There is a subtle sclerotic focus within the L5 vertebral body which is rounded measuring proximally 2 cm. No acute fractures are seen. IMPRESSION: 1. No evidence for metastatic disease in the abdomen or pelvis. 2. Wall thickening of the distal esophagus with mild surrounding inflammation worrisome for esophagitis. 3. Trace bilateral pleural effusions with bibasilar atelectasis. 4. Cholelithiasis. 5. Colonic diverticulosis. 6. Indeterminate sclerotic focus in the L5 vertebral body. Metastatic disease not excluded. 7. 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 8. Left Bosniak I benign renal cyst measuring 8.1 cm. No follow-up imaging is recommended. JACR 2018 Feb; 264-273, Management of the Incidental Renal Mass on CT, [employer] 2021; 814-848, Bosniak Classification of Cystic Renal Masses, Version 2019. Aortic Atherosclerosis (ICD10-I70.0). Electronically Signed   By: Darliss Cheney M.D.   On: 04/19/2023 20:18    Scheduled Meds:  [START ON 04/21/2023] enoxaparin (LOVENOX) injection  90 mg Subcutaneous Q12H   guaiFENesin  600 mg Oral BID   influenza vaccine adjuvanted  0.5 mL Intramuscular Tomorrow-1000   insulin aspart  0-15 Units Subcutaneous TID WC   pantoprazole (PROTONIX) IV  40 mg Intravenous QHS   Continuous Infusions:  vancomycin       LOS: 6 days   Time spent:  Azucena Fallen, DO [facility]  If 7PM-7AM, please contact night-coverage [email or web address]  04/20/2023, 7:45 AM

## 2023-04-20 NOTE — Progress Notes (Signed)
Rounding Note    Patient Name: Samuel [name] Date of Encounter: 04/20/2023  [facility] Cardiologist: [name], MD   Subjective   Doing "fine", hopes that things keep moving as far as PEG, feeds, etc.  Inpatient Medications    Scheduled Meds:  [START ON 04/21/2023] enoxaparin (LOVENOX) injection  90 mg Subcutaneous Q12H   guaiFENesin  600 mg Oral BID   influenza vaccine adjuvanted  0.5 mL Intramuscular Tomorrow-1000   insulin aspart  0-15 Units Subcutaneous TID WC   pantoprazole (PROTONIX) IV  40 mg Intravenous QHS   Continuous Infusions:  vancomycin      PRN Meds: acetaminophen **OR** acetaminophen, polyethylene glycol   Vital Signs    Vitals:   04/19/23 1557 04/19/23 2000 04/20/23 0422 04/20/23 0914  BP: (!) 146/78 (!) 145/76 132/71 123/74  Pulse: 63 60 (!) 48 (!) 50  Resp: 20 12 19 16   Temp: 98 F (36.7 C) 98 F (36.7 C) 98 F (36.7 C) 98.2 F (36.8 C)  TempSrc:  Oral Oral Oral  SpO2: 94% 97% 98% 95%  Weight:      Height:        Intake/Output Summary (Last 24 hours) at 04/20/2023 9604 Last data filed at 04/20/2023 0900 Gross per 24 hour  Intake --  Output 400 ml  Net -400 ml      04/18/2023    9:40 AM 04/14/2023   12:47 PM 12/15/2022   10:13 AM  Last 3 Weights  Weight (lbs) 203 lb 14.8 oz 203 lb 14.8 oz 204 lb  Weight (kg) 92.5 kg 92.5 kg 92.534 kg      Telemetry    SR/SB 50's-60's with intermittent 2:1 AVblock 29-30's slower rates overnight with sleep, though generally HRs look slightly better  - Personally Reviewed  ECG    No new EKGs - Personally Reviewed  Physical Exam   Exam is unchanged GEN: No acute distress.   Neck: No JVD Cardiac: RRR, no murmurs, rubs, or gallops.  Respiratory: CTA b/l. GI: Soft, nontender  MS: No edema; No deformity. Neuro:  Nonfocal  Psych: Normal affect   Labs    High Sensitivity Troponin:   Recent Labs  Lab 04/14/23 1416 04/14/23 1752 04/16/23 0101  TROPONINIHS 56* 67* 67*      Chemistry Recent Labs  Lab 04/15/23 1028 04/16/23 0101 04/16/23 0605 04/18/23 0449 04/19/23 0825 04/20/23 0315  NA  --  140   < > 141 142 144  K  --  3.6   < > 3.2* 3.4* 3.5  CL  --  107   < > 108 111 108  CO2  --  23   < > 22 23 23   GLUCOSE  --  82   < > 83 101* 83  BUN  --  13   < > 13 10 8   CREATININE  --  0.83   < > 1.09 0.79 0.75  CALCIUM  --  8.5*   < > 8.4* 8.2* 9.1  MG 2.4 2.2  --  2.3  --   --   PROT  --   --    < > 5.0* 5.3* 4.9*  ALBUMIN  --   --    < > 2.5* 2.6* 2.4*  AST  --   --    < > 46* 40 37  ALT  --   --    < > 53* 46* 41  ALKPHOS  --   --    < >  48 48 46  BILITOT  --   --    < > 1.6* 1.4* 1.2  GFRNONAA  --  >60   < > >60 >60 >60  ANIONGAP  --  10   < > 11 8 13    < > = values in this interval not displayed.    Lipids No results for input(s): "CHOL", "TRIG", "HDL", "LABVLDL", "LDLCALC", "CHOLHDL" in the last 168 hours.  Hematology Recent Labs  Lab 04/17/23 0404 04/18/23 0449 04/19/23 0825  WBC 10.4 9.3 11.1*  RBC 5.33 5.62 5.69  HGB 15.4 16.4 16.9  HCT 46.0 49.4 49.5  MCV 86.3 87.9 87.0  MCH 28.9 29.2 29.7  MCHC 33.5 33.2 34.1  RDW 13.6 13.9 14.4  PLT 149* 162 175   Thyroid  Recent Labs  Lab 04/15/23 1750  TSH 0.242*  FREET4 1.28*    BNPNo results for input(s): "BNP", "PROBNP" in the last 168 hours.  DDimer No results for input(s): "DDIMER" in the last 168 hours.   Radiology    CT ABDOMEN PELVIS W CONTRAST  Result Date: 04/19/2023 CLINICAL DATA:  Colon cancer metastatic disease. EXAM: CT ABDOMEN AND PELVIS WITH CONTRAST TECHNIQUE: Multidetector CT imaging of the abdomen and pelvis was performed using the standard protocol following bolus administration of intravenous contrast. RADIATION DOSE REDUCTION: This exam was performed according to the departmental dose-optimization program which includes automated exposure control, adjustment of the mA and/or kV according to patient size and/or use of iterative reconstruction technique.  CONTRAST:  75mL OMNIPAQUE IOHEXOL 350 MG/ML SOLN COMPARISON:  None. FINDINGS: Lower chest: There are trace bilateral pleural effusions with bibasilar atelectasis. Hepatobiliary: Small gallstone is present. There is no biliary ductal dilatation. There is fatty infiltration of the liver. No focal liver lesions are seen. Pancreas: There is a low-attenuation/cystic area in the pancreatic head measuring 1.7 x 1.2 cm. There is mild diffuse pancreatic atrophy. There is no pancreatic ductal dilatation or surrounding inflammation. Spleen: Normal in size without focal abnormality. Adrenals/Urinary Tract: There is a lobulated left renal cyst measuring 8.1 x 6.1 cm. Otherwise, the kidneys, adrenal glands and bladder are within normal limits. Stomach/Bowel: Stomach is within normal limits. Appendix appears normal. No evidence of bowel wall thickening, distention, or inflammatory changes. Focal colonic mass not visualized on this study. There is diffuse colonic diverticulosis. There is wall thickening of the distal esophagus with mild surrounding inflammation. Vascular/Lymphatic: Aortic atherosclerosis. No enlarged abdominal or pelvic lymph nodes. Reproductive: Prostate gland is enlarged. Other: No abdominal wall hernia or abnormality. No abdominopelvic ascites. Musculoskeletal: There is a subtle sclerotic focus within the L5 vertebral body which is rounded measuring proximally 2 cm. No acute fractures are seen. IMPRESSION: 1. No evidence for metastatic disease in the abdomen or pelvis. 2. Wall thickening of the distal esophagus with mild surrounding inflammation worrisome for esophagitis. 3. Trace bilateral pleural effusions with bibasilar atelectasis. 4. Cholelithiasis. 5. Colonic diverticulosis. 6. Indeterminate sclerotic focus in the L5 vertebral body. Metastatic disease not excluded. 7. 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 8. Left Bosniak I benign renal cyst measuring 8.1 cm. No  follow-up imaging is recommended. JACR 2018 Feb; 264-273, Management of the Incidental Renal Mass on CT, [employer] 2021; 814-848, Bosniak Classification of Cystic Renal Masses, Version 2019. Aortic Atherosclerosis (ICD10-I70.0). Electronically Signed   By: Darliss Cheney M.D.   On: 04/19/2023 20:18    Cardiac Studies   04/15/23: TTE  1. Left ventricular ejection fraction, by estimation,  is 55%. The left  ventricle has normal function. The left ventricle has no regional wall  motion abnormalities. Left ventricular diastolic parameters are consistent  with Grade I diastolic dysfunction  (impaired relaxation).   2. Right ventricular systolic function is normal. The right ventricular  size is normal. Tricuspid regurgitation signal is inadequate for assessing  PA pressure.   3. The mitral valve is normal in structure. Trivial mitral valve  regurgitation. No evidence of mitral stenosis.   4. The aortic valve is grossly normal. Aortic valve regurgitation is mild  to moderate. Aortic valve sclerosis is present, with no evidence of aortic  valve stenosis.   5. The inferior vena cava is normal in size with greater than 50%  respiratory variability, suggesting right atrial pressure of 3 mmHg.   Patient Profile     76 y.o. male w/PMHx of DM admitted with progressive dysphagia to the point of unable to eat/drink anything, pleuritic CP  Found with acute b/l DVTs and PE >> heparin incidentally noted to have intermittent 2:1 AV block Long segment of esophageal stenosis/stricture  Assessment & Plan    Intermittent Mobitz II AVblock w/ 2:1 conduction No nodal blocking agents  He ambulated 10/14 and reported once he got started felt he did well, without lightheadedness dizziness. HR rose to the 70's  Continues to be able to raise HRs with just talking, moving in bed   Dr. Jimmey Ralph has seen the patient:  he has had now evidence of transient higher AV  block, without any obvious or new  symptoms  Will wait to get a clearer picture of management strategy for esophageal malignancy prior to decisions on PPM (or not)  HR /hemodynamically stable, no urgency for pacing, though will plan for PPM, with suspicion he will need pacing support for his cancer management (any surgeries, chemo, XRT)  Will need to be off lovenox getting anticoagulation PO/per tube (if able to be stented) or per tube prior to PPM, suspect implant timing would be early next week  Will ask port (when/if placed here) not be placed on left side please  Is there any suspicion that XRT may be in his future?  If so, where, would plan pacer placement on contralateral side       As per attending team:   # acute PE #acute b/l LE DVTs On therapeutic lovenox pending Peg tube  # progressive dysphagia     Esophageal stenosis S/p EGD w/malignant appearing intrinsic severe stenosis was found 30 to 34 cm from incisors (biopsied > pending) Oncology is on board     ? R sternoclavicular swelling ?sclerotic lesion T2 noted on CT Pending evaluation for any potential METS and his biopsy result (in my understandinhg of oncology note)     For questions or updates, please contact [facility] Please consult [email or web address] for contact info under        Signed, Sheilah Pigeon, PA-C  04/20/2023, 9:22 AM

## 2023-04-20 NOTE — Procedures (Signed)
Vascular and Interventional Radiology Procedure Note  Patient: Samuel [name] DOB: [birth year] Medical Record Number: [id number] Note Date/Time: 04/20/23 11:06 AM   Performing Physician: Roanna Banning, MD Assistant(s): None  Diagnosis: Dysphagia. Esophageal stricture, likely malignant  Procedure: PERCUTANEOUS GASTROSTOMY TUBE PLACEMENT  Anesthesia: Conscious Sedation Complications: None Estimated Blood Loss: Minimal  Findings:  Successful placement of a  87 F gastrostomy tube under fluoroscopy.   See detailed procedure note with images in PACS. The patient tolerated the procedure well without incident or complication and was returned to Recovery in stable condition.    Roanna Banning, MD [facility]   Pager. [phone] Clinic. [phone]

## 2023-04-20 NOTE — Progress Notes (Addendum)
PHARMACY - ANTICOAGULATION CONSULT NOTE  Pharmacy Consult for Apixaban Indication: acute pulmonary embolus  Allergies  Allergen Reactions   Penicillins     Shortness of breath   Sulfa Antibiotics     Blisters    Procaine Other (See Comments)    Caused fatigue    Patient Measurements: Height: 5\' 6"  (167.6 cm) Weight: 92.5 kg (203 lb 14.8 oz) IBW/kg (Calculated) : 63.8 Heparin Dosing Weight: 84 kg  Vital Signs: Temp: 98.2 F (36.8 C) (10/17 1213) Temp Source: Oral (10/17 1213) BP: 134/74 (10/17 1213) Pulse Rate: 58 (10/17 1213)  Labs: Recent Labs    04/18/23 0449 04/19/23 0825 04/20/23 0315 04/20/23 0745  HGB 16.4 16.9  --   --   HCT 49.4 49.5  --   --   PLT 162 175  --   --   LABPROT  --   --   --  16.9*  INR  --   --   --  1.4*  HEPARINUNFRC 0.33  --   --   --   CREATININE 1.09 0.79 0.75  --     Estimated Creatinine Clearance: 82.4 mL/min (by C-G formula based on SCr of 0.75 mg/dL).   Medical History: Past Medical History:  Diagnosis Date   Diabetes mellitus without complication [facility])     Assessment: Patient is a 77 y.o M who presented to the ED on 04/14/23 with c/o CP and difficulty swallowing. Chest CT showed acute PE in the right main pulmonary artery extending into the upper and lower lobes with no evidence of RHS. He was not on anticoagulation PTA. Pharmacy has been consulted to change enoxaparin to apixaban for VTE treatment and to change medications to Gtube route.     Plan:  In discussion with Dr. Natale Milch, given his risk factors for bleeding, the recent PEG-tube placement, and the fact that the patient has been treated with 1 week of heparin infusion/SQ enoxaparin, we will start apixaban 5 mg BID rather than 7 days of 10 mg BID starting tomorrow (10/18) AM.   The following medications were modified to per tube:  Apixaban 5 mg BID  Guaifenesin 600 mg ER tablet q12h changed to liquid 30 mL q12h  Miralax 17 g daily  Acetaminophen 650 mg q6h PRN    Pharmacy will sign off please reconsult if needed   Cedric Fishman, PharmD, BCPS, BCCCP Clinical Pharmacist

## 2023-04-20 NOTE — Consult Note (Addendum)
is no pancreatic ductal dilatation or surrounding inflammation. Spleen: Normal in size without focal abnormality. Adrenals/Urinary Tract: There is a lobulated left renal cyst measuring 8.1 x 6.1 cm. Otherwise, the kidneys, adrenal glands and bladder are within normal limits. Stomach/Bowel: Stomach is within normal limits. Appendix appears normal. No evidence of bowel wall thickening, distention, or inflammatory changes. Focal colonic mass not visualized on this study. There is diffuse colonic diverticulosis. There is wall thickening of the distal esophagus with mild surrounding inflammation. Vascular/Lymphatic: Aortic atherosclerosis. No enlarged abdominal or pelvic lymph nodes. Reproductive: Prostate gland is enlarged. Other: No abdominal wall hernia or abnormality. No abdominopelvic ascites. Musculoskeletal: There is a subtle sclerotic focus within the L5 vertebral body which is rounded measuring proximally 2 cm. No acute fractures are seen. IMPRESSION: 1. No evidence for metastatic disease in the abdomen or pelvis. 2. Wall thickening of the distal esophagus with mild surrounding inflammation  worrisome for esophagitis. 3. Trace bilateral pleural effusions with bibasilar atelectasis. 4. Cholelithiasis. 5. Colonic diverticulosis. 6. Indeterminate sclerotic focus in the L5 vertebral body. Metastatic disease not excluded. 7. 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 8. Left Bosniak I benign renal cyst measuring 8.1 cm. No follow-up imaging is recommended. JACR 2018 Feb; 264-273, Management of the Incidental Renal Mass on CT, [employer] 2021; 814-848, Bosniak Classification of Cystic Renal Masses, Version 2019. Aortic Atherosclerosis (ICD10-I70.0). Electronically Signed   By: Darliss Cheney M.D.   On: 04/19/2023 20:18   DG ESOPHAGUS W SINGLE CM (SOL OR THIN BA)  Result Date: 04/17/2023 CLINICAL DATA:  77 year old male with trouble swallowing both liquid and solid food. EXAM: ESOPHAGUS/BARIUM SWALLOW/TABLET STUDY TECHNIQUE: Single contrast examination was performed using thin liquid barium. This exam was performed by Loyce Dys PA-C, and was supervised and interpreted by Roanna Banning, MD. FLUOROSCOPY: Radiation Exposure Index (as provided by the fluoroscopic device): 13 mGy Kerma COMPARISON:  CT CHEST 04/14/2023. FINDINGS: Swallowing: Appears normal. No vestibular penetration or aspiration seen. Pharynx:  No masses, lesions or ulcers observed. Esophagus: Abrupt tapering in the thoracic esophagus with only a trickle of barium observed to intermittently pass into the stomach. Unable to assess for motility, hiatal hernia, and gastroesophageal reflux. 13mm barium tablet was not given. 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 Correlate with direct endoscopic visualization. Electronically Signed   By: Roanna Banning M.D.   On: 04/17/2023 10:24   ECHOCARDIOGRAM COMPLETE  Result Date: 04/15/2023    ECHOCARDIOGRAM REPORT   Patient Name:   Samuel [name] Date of Exam: 04/15/2023  Medical Rec #:  [id number]   Height:       66.0 in Accession #:    [id number]  Weight:       203.9 lb Date of Birth:  02-01-1946   BSA:          2.016 m Patient Age:    77 years    BP:           127/67 mmHg Patient Gender: M           HR:           61 bpm. Exam Location:  Inpatient Procedure: 2D Echo, Cardiac Doppler and Color Doppler Indications:    Pulmonary embolus  History:        Patient has no prior history of Echocardiogram examinations.  A2C: 49.4 ml LV vol s, MOD A4C: 43.6 ml LV SV MOD A2C:     49.3 ml LV SV MOD A4C:     100.0 ml LV SV MOD BP:      54.7 ml RIGHT VENTRICLE             IVC RV Basal diam:  2.70 cm     IVC diam: 1.40 cm RV S prime:     20.00 cm/s TAPSE (M-mode): 2.2 cm LEFT ATRIUM             Index        RIGHT ATRIUM          Index LA diam:        2.90 cm 1.44 cm/m   RA Area:     7.43 cm LA Vol (A2C):   34.7 ml 17.21 ml/m  RA Volume:   9.46 ml  4.69 ml/m LA Vol (A4C):   31.4 ml 15.57 ml/m LA Biplane Vol: 30.9 ml 15.32 ml/m  AORTIC VALVE LVOT Vmax:   130.00 cm/s LVOT Vmean:  87.200 cm/s LVOT VTI:    0.259 m AI PHT:      842 msec  AORTA Ao Root diam: 3.60 cm Ao Asc diam:  3.80 cm MITRAL VALVE               TRICUSPID VALVE MV Area (PHT): 1.93 cm    TR Peak grad:   7.2 mmHg MV Decel Time: 394 msec    TR Vmax:        134.00 cm/s MV E velocity: 50.10 cm/s MV A velocity: 93.80 cm/s  SHUNTS MV E/A ratio:  0.53        Systemic VTI:  0.26 m                            Systemic Diam: 2.50 cm [name] MD Electronically signed by [name] MD Signature Date/Time: 04/15/2023/3:27:38 PM    Final    VAS [place] LOWER EXTREMITY VENOUS (DVT)  Result Date: 04/15/2023  Lower Venous DVT Study Patient Name:  Samuel [name]  Date of Exam:   04/15/2023 Medical Rec #: [id number]    Accession #:    [id number] Date of Birth: 04-15-1946    Patient Gender: M Patient Age:   77 years Exam Location:  [facility] Procedure:      VAS [place] LOWER EXTREMITY VENOUS (DVT) Referring Phys: [facility] GOEL --------------------------------------------------------------------------------  Indications: Pulmonary embolism.  Comparison Study: No previous exams Performing Technologist: Jody Hill RVT, RDMS  Examination Guidelines: A complete evaluation includes B-mode imaging, spectral Doppler, color Doppler, and power Doppler as needed of all accessible portions of each vessel. Bilateral testing is considered an integral part of a complete examination. Limited  examinations for reoccurring indications may be performed as noted. The reflux portion of the exam is performed with the patient in reverse Trendelenburg.  +---------+---------------+---------+-----------+----------+--------------+ RIGHT    CompressibilityPhasicitySpontaneityPropertiesThrombus Aging +---------+---------------+---------+-----------+----------+--------------+ CFV      Full           Yes      Yes                                 +---------+---------------+---------+-----------+----------+--------------+ SFJ      Full                                                        +---------+---------------+---------+-----------+----------+--------------+  A2C: 49.4 ml LV vol s, MOD A4C: 43.6 ml LV SV MOD A2C:     49.3 ml LV SV MOD A4C:     100.0 ml LV SV MOD BP:      54.7 ml RIGHT VENTRICLE             IVC RV Basal diam:  2.70 cm     IVC diam: 1.40 cm RV S prime:     20.00 cm/s TAPSE (M-mode): 2.2 cm LEFT ATRIUM             Index        RIGHT ATRIUM          Index LA diam:        2.90 cm 1.44 cm/m   RA Area:     7.43 cm LA Vol (A2C):   34.7 ml 17.21 ml/m  RA Volume:   9.46 ml  4.69 ml/m LA Vol (A4C):   31.4 ml 15.57 ml/m LA Biplane Vol: 30.9 ml 15.32 ml/m  AORTIC VALVE LVOT Vmax:   130.00 cm/s LVOT Vmean:  87.200 cm/s LVOT VTI:    0.259 m AI PHT:      842 msec  AORTA Ao Root diam: 3.60 cm Ao Asc diam:  3.80 cm MITRAL VALVE               TRICUSPID VALVE MV Area (PHT): 1.93 cm    TR Peak grad:   7.2 mmHg MV Decel Time: 394 msec    TR Vmax:        134.00 cm/s MV E velocity: 50.10 cm/s MV A velocity: 93.80 cm/s  SHUNTS MV E/A ratio:  0.53        Systemic VTI:  0.26 m                            Systemic Diam: 2.50 cm [name] MD Electronically signed by [name] MD Signature Date/Time: 04/15/2023/3:27:38 PM    Final    VAS [place] LOWER EXTREMITY VENOUS (DVT)  Result Date: 04/15/2023  Lower Venous DVT Study Patient Name:  Samuel [name]  Date of Exam:   04/15/2023 Medical Rec #: [id number]    Accession #:    [id number] Date of Birth: 04-15-1946    Patient Gender: M Patient Age:   77 years Exam Location:  [facility] Procedure:      VAS [place] LOWER EXTREMITY VENOUS (DVT) Referring Phys: [facility] GOEL --------------------------------------------------------------------------------  Indications: Pulmonary embolism.  Comparison Study: No previous exams Performing Technologist: Jody Hill RVT, RDMS  Examination Guidelines: A complete evaluation includes B-mode imaging, spectral Doppler, color Doppler, and power Doppler as needed of all accessible portions of each vessel. Bilateral testing is considered an integral part of a complete examination. Limited  examinations for reoccurring indications may be performed as noted. The reflux portion of the exam is performed with the patient in reverse Trendelenburg.  +---------+---------------+---------+-----------+----------+--------------+ RIGHT    CompressibilityPhasicitySpontaneityPropertiesThrombus Aging +---------+---------------+---------+-----------+----------+--------------+ CFV      Full           Yes      Yes                                 +---------+---------------+---------+-----------+----------+--------------+ SFJ      Full                                                        +---------+---------------+---------+-----------+----------+--------------+  is no pancreatic ductal dilatation or surrounding inflammation. Spleen: Normal in size without focal abnormality. Adrenals/Urinary Tract: There is a lobulated left renal cyst measuring 8.1 x 6.1 cm. Otherwise, the kidneys, adrenal glands and bladder are within normal limits. Stomach/Bowel: Stomach is within normal limits. Appendix appears normal. No evidence of bowel wall thickening, distention, or inflammatory changes. Focal colonic mass not visualized on this study. There is diffuse colonic diverticulosis. There is wall thickening of the distal esophagus with mild surrounding inflammation. Vascular/Lymphatic: Aortic atherosclerosis. No enlarged abdominal or pelvic lymph nodes. Reproductive: Prostate gland is enlarged. Other: No abdominal wall hernia or abnormality. No abdominopelvic ascites. Musculoskeletal: There is a subtle sclerotic focus within the L5 vertebral body which is rounded measuring proximally 2 cm. No acute fractures are seen. IMPRESSION: 1. No evidence for metastatic disease in the abdomen or pelvis. 2. Wall thickening of the distal esophagus with mild surrounding inflammation  worrisome for esophagitis. 3. Trace bilateral pleural effusions with bibasilar atelectasis. 4. Cholelithiasis. 5. Colonic diverticulosis. 6. Indeterminate sclerotic focus in the L5 vertebral body. Metastatic disease not excluded. 7. 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 8. Left Bosniak I benign renal cyst measuring 8.1 cm. No follow-up imaging is recommended. JACR 2018 Feb; 264-273, Management of the Incidental Renal Mass on CT, [employer] 2021; 814-848, Bosniak Classification of Cystic Renal Masses, Version 2019. Aortic Atherosclerosis (ICD10-I70.0). Electronically Signed   By: Darliss Cheney M.D.   On: 04/19/2023 20:18   DG ESOPHAGUS W SINGLE CM (SOL OR THIN BA)  Result Date: 04/17/2023 CLINICAL DATA:  77 year old male with trouble swallowing both liquid and solid food. EXAM: ESOPHAGUS/BARIUM SWALLOW/TABLET STUDY TECHNIQUE: Single contrast examination was performed using thin liquid barium. This exam was performed by Loyce Dys PA-C, and was supervised and interpreted by Roanna Banning, MD. FLUOROSCOPY: Radiation Exposure Index (as provided by the fluoroscopic device): 13 mGy Kerma COMPARISON:  CT CHEST 04/14/2023. FINDINGS: Swallowing: Appears normal. No vestibular penetration or aspiration seen. Pharynx:  No masses, lesions or ulcers observed. Esophagus: Abrupt tapering in the thoracic esophagus with only a trickle of barium observed to intermittently pass into the stomach. Unable to assess for motility, hiatal hernia, and gastroesophageal reflux. 13mm barium tablet was not given. 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 Correlate with direct endoscopic visualization. Electronically Signed   By: Roanna Banning M.D.   On: 04/17/2023 10:24   ECHOCARDIOGRAM COMPLETE  Result Date: 04/15/2023    ECHOCARDIOGRAM REPORT   Patient Name:   Samuel [name] Date of Exam: 04/15/2023  Medical Rec #:  [id number]   Height:       66.0 in Accession #:    [id number]  Weight:       203.9 lb Date of Birth:  02-01-1946   BSA:          2.016 m Patient Age:    77 years    BP:           127/67 mmHg Patient Gender: M           HR:           61 bpm. Exam Location:  Inpatient Procedure: 2D Echo, Cardiac Doppler and Color Doppler Indications:    Pulmonary embolus  History:        Patient has no prior history of Echocardiogram examinations.  is no pancreatic ductal dilatation or surrounding inflammation. Spleen: Normal in size without focal abnormality. Adrenals/Urinary Tract: There is a lobulated left renal cyst measuring 8.1 x 6.1 cm. Otherwise, the kidneys, adrenal glands and bladder are within normal limits. Stomach/Bowel: Stomach is within normal limits. Appendix appears normal. No evidence of bowel wall thickening, distention, or inflammatory changes. Focal colonic mass not visualized on this study. There is diffuse colonic diverticulosis. There is wall thickening of the distal esophagus with mild surrounding inflammation. Vascular/Lymphatic: Aortic atherosclerosis. No enlarged abdominal or pelvic lymph nodes. Reproductive: Prostate gland is enlarged. Other: No abdominal wall hernia or abnormality. No abdominopelvic ascites. Musculoskeletal: There is a subtle sclerotic focus within the L5 vertebral body which is rounded measuring proximally 2 cm. No acute fractures are seen. IMPRESSION: 1. No evidence for metastatic disease in the abdomen or pelvis. 2. Wall thickening of the distal esophagus with mild surrounding inflammation  worrisome for esophagitis. 3. Trace bilateral pleural effusions with bibasilar atelectasis. 4. Cholelithiasis. 5. Colonic diverticulosis. 6. Indeterminate sclerotic focus in the L5 vertebral body. Metastatic disease not excluded. 7. 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 8. Left Bosniak I benign renal cyst measuring 8.1 cm. No follow-up imaging is recommended. JACR 2018 Feb; 264-273, Management of the Incidental Renal Mass on CT, [employer] 2021; 814-848, Bosniak Classification of Cystic Renal Masses, Version 2019. Aortic Atherosclerosis (ICD10-I70.0). Electronically Signed   By: Darliss Cheney M.D.   On: 04/19/2023 20:18   DG ESOPHAGUS W SINGLE CM (SOL OR THIN BA)  Result Date: 04/17/2023 CLINICAL DATA:  77 year old male with trouble swallowing both liquid and solid food. EXAM: ESOPHAGUS/BARIUM SWALLOW/TABLET STUDY TECHNIQUE: Single contrast examination was performed using thin liquid barium. This exam was performed by Loyce Dys PA-C, and was supervised and interpreted by Roanna Banning, MD. FLUOROSCOPY: Radiation Exposure Index (as provided by the fluoroscopic device): 13 mGy Kerma COMPARISON:  CT CHEST 04/14/2023. FINDINGS: Swallowing: Appears normal. No vestibular penetration or aspiration seen. Pharynx:  No masses, lesions or ulcers observed. Esophagus: Abrupt tapering in the thoracic esophagus with only a trickle of barium observed to intermittently pass into the stomach. Unable to assess for motility, hiatal hernia, and gastroesophageal reflux. 13mm barium tablet was not given. 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 Correlate with direct endoscopic visualization. Electronically Signed   By: Roanna Banning M.D.   On: 04/17/2023 10:24   ECHOCARDIOGRAM COMPLETE  Result Date: 04/15/2023    ECHOCARDIOGRAM REPORT   Patient Name:   Samuel [name] Date of Exam: 04/15/2023  Medical Rec #:  [id number]   Height:       66.0 in Accession #:    [id number]  Weight:       203.9 lb Date of Birth:  02-01-1946   BSA:          2.016 m Patient Age:    77 years    BP:           127/67 mmHg Patient Gender: M           HR:           61 bpm. Exam Location:  Inpatient Procedure: 2D Echo, Cardiac Doppler and Color Doppler Indications:    Pulmonary embolus  History:        Patient has no prior history of Echocardiogram examinations.  A2C: 49.4 ml LV vol s, MOD A4C: 43.6 ml LV SV MOD A2C:     49.3 ml LV SV MOD A4C:     100.0 ml LV SV MOD BP:      54.7 ml RIGHT VENTRICLE             IVC RV Basal diam:  2.70 cm     IVC diam: 1.40 cm RV S prime:     20.00 cm/s TAPSE (M-mode): 2.2 cm LEFT ATRIUM             Index        RIGHT ATRIUM          Index LA diam:        2.90 cm 1.44 cm/m   RA Area:     7.43 cm LA Vol (A2C):   34.7 ml 17.21 ml/m  RA Volume:   9.46 ml  4.69 ml/m LA Vol (A4C):   31.4 ml 15.57 ml/m LA Biplane Vol: 30.9 ml 15.32 ml/m  AORTIC VALVE LVOT Vmax:   130.00 cm/s LVOT Vmean:  87.200 cm/s LVOT VTI:    0.259 m AI PHT:      842 msec  AORTA Ao Root diam: 3.60 cm Ao Asc diam:  3.80 cm MITRAL VALVE               TRICUSPID VALVE MV Area (PHT): 1.93 cm    TR Peak grad:   7.2 mmHg MV Decel Time: 394 msec    TR Vmax:        134.00 cm/s MV E velocity: 50.10 cm/s MV A velocity: 93.80 cm/s  SHUNTS MV E/A ratio:  0.53        Systemic VTI:  0.26 m                            Systemic Diam: 2.50 cm [name] MD Electronically signed by [name] MD Signature Date/Time: 04/15/2023/3:27:38 PM    Final    VAS [place] LOWER EXTREMITY VENOUS (DVT)  Result Date: 04/15/2023  Lower Venous DVT Study Patient Name:  Samuel [name]  Date of Exam:   04/15/2023 Medical Rec #: [id number]    Accession #:    [id number] Date of Birth: 04-15-1946    Patient Gender: M Patient Age:   77 years Exam Location:  [facility] Procedure:      VAS [place] LOWER EXTREMITY VENOUS (DVT) Referring Phys: [facility] GOEL --------------------------------------------------------------------------------  Indications: Pulmonary embolism.  Comparison Study: No previous exams Performing Technologist: Jody Hill RVT, RDMS  Examination Guidelines: A complete evaluation includes B-mode imaging, spectral Doppler, color Doppler, and power Doppler as needed of all accessible portions of each vessel. Bilateral testing is considered an integral part of a complete examination. Limited  examinations for reoccurring indications may be performed as noted. The reflux portion of the exam is performed with the patient in reverse Trendelenburg.  +---------+---------------+---------+-----------+----------+--------------+ RIGHT    CompressibilityPhasicitySpontaneityPropertiesThrombus Aging +---------+---------------+---------+-----------+----------+--------------+ CFV      Full           Yes      Yes                                 +---------+---------------+---------+-----------+----------+--------------+ SFJ      Full                                                        +---------+---------------+---------+-----------+----------+--------------+  is no pancreatic ductal dilatation or surrounding inflammation. Spleen: Normal in size without focal abnormality. Adrenals/Urinary Tract: There is a lobulated left renal cyst measuring 8.1 x 6.1 cm. Otherwise, the kidneys, adrenal glands and bladder are within normal limits. Stomach/Bowel: Stomach is within normal limits. Appendix appears normal. No evidence of bowel wall thickening, distention, or inflammatory changes. Focal colonic mass not visualized on this study. There is diffuse colonic diverticulosis. There is wall thickening of the distal esophagus with mild surrounding inflammation. Vascular/Lymphatic: Aortic atherosclerosis. No enlarged abdominal or pelvic lymph nodes. Reproductive: Prostate gland is enlarged. Other: No abdominal wall hernia or abnormality. No abdominopelvic ascites. Musculoskeletal: There is a subtle sclerotic focus within the L5 vertebral body which is rounded measuring proximally 2 cm. No acute fractures are seen. IMPRESSION: 1. No evidence for metastatic disease in the abdomen or pelvis. 2. Wall thickening of the distal esophagus with mild surrounding inflammation  worrisome for esophagitis. 3. Trace bilateral pleural effusions with bibasilar atelectasis. 4. Cholelithiasis. 5. Colonic diverticulosis. 6. Indeterminate sclerotic focus in the L5 vertebral body. Metastatic disease not excluded. 7. 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 8. Left Bosniak I benign renal cyst measuring 8.1 cm. No follow-up imaging is recommended. JACR 2018 Feb; 264-273, Management of the Incidental Renal Mass on CT, [employer] 2021; 814-848, Bosniak Classification of Cystic Renal Masses, Version 2019. Aortic Atherosclerosis (ICD10-I70.0). Electronically Signed   By: Darliss Cheney M.D.   On: 04/19/2023 20:18   DG ESOPHAGUS W SINGLE CM (SOL OR THIN BA)  Result Date: 04/17/2023 CLINICAL DATA:  77 year old male with trouble swallowing both liquid and solid food. EXAM: ESOPHAGUS/BARIUM SWALLOW/TABLET STUDY TECHNIQUE: Single contrast examination was performed using thin liquid barium. This exam was performed by Loyce Dys PA-C, and was supervised and interpreted by Roanna Banning, MD. FLUOROSCOPY: Radiation Exposure Index (as provided by the fluoroscopic device): 13 mGy Kerma COMPARISON:  CT CHEST 04/14/2023. FINDINGS: Swallowing: Appears normal. No vestibular penetration or aspiration seen. Pharynx:  No masses, lesions or ulcers observed. Esophagus: Abrupt tapering in the thoracic esophagus with only a trickle of barium observed to intermittently pass into the stomach. Unable to assess for motility, hiatal hernia, and gastroesophageal reflux. 13mm barium tablet was not given. 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 Correlate with direct endoscopic visualization. Electronically Signed   By: Roanna Banning M.D.   On: 04/17/2023 10:24   ECHOCARDIOGRAM COMPLETE  Result Date: 04/15/2023    ECHOCARDIOGRAM REPORT   Patient Name:   Samuel [name] Date of Exam: 04/15/2023  Medical Rec #:  [id number]   Height:       66.0 in Accession #:    [id number]  Weight:       203.9 lb Date of Birth:  02-01-1946   BSA:          2.016 m Patient Age:    77 years    BP:           127/67 mmHg Patient Gender: M           HR:           61 bpm. Exam Location:  Inpatient Procedure: 2D Echo, Cardiac Doppler and Color Doppler Indications:    Pulmonary embolus  History:        Patient has no prior history of Echocardiogram examinations.  A2C: 49.4 ml LV vol s, MOD A4C: 43.6 ml LV SV MOD A2C:     49.3 ml LV SV MOD A4C:     100.0 ml LV SV MOD BP:      54.7 ml RIGHT VENTRICLE             IVC RV Basal diam:  2.70 cm     IVC diam: 1.40 cm RV S prime:     20.00 cm/s TAPSE (M-mode): 2.2 cm LEFT ATRIUM             Index        RIGHT ATRIUM          Index LA diam:        2.90 cm 1.44 cm/m   RA Area:     7.43 cm LA Vol (A2C):   34.7 ml 17.21 ml/m  RA Volume:   9.46 ml  4.69 ml/m LA Vol (A4C):   31.4 ml 15.57 ml/m LA Biplane Vol: 30.9 ml 15.32 ml/m  AORTIC VALVE LVOT Vmax:   130.00 cm/s LVOT Vmean:  87.200 cm/s LVOT VTI:    0.259 m AI PHT:      842 msec  AORTA Ao Root diam: 3.60 cm Ao Asc diam:  3.80 cm MITRAL VALVE               TRICUSPID VALVE MV Area (PHT): 1.93 cm    TR Peak grad:   7.2 mmHg MV Decel Time: 394 msec    TR Vmax:        134.00 cm/s MV E velocity: 50.10 cm/s MV A velocity: 93.80 cm/s  SHUNTS MV E/A ratio:  0.53        Systemic VTI:  0.26 m                            Systemic Diam: 2.50 cm [name] MD Electronically signed by [name] MD Signature Date/Time: 04/15/2023/3:27:38 PM    Final    VAS [place] LOWER EXTREMITY VENOUS (DVT)  Result Date: 04/15/2023  Lower Venous DVT Study Patient Name:  Samuel [name]  Date of Exam:   04/15/2023 Medical Rec #: [id number]    Accession #:    [id number] Date of Birth: 04-15-1946    Patient Gender: M Patient Age:   77 years Exam Location:  [facility] Procedure:      VAS [place] LOWER EXTREMITY VENOUS (DVT) Referring Phys: [facility] GOEL --------------------------------------------------------------------------------  Indications: Pulmonary embolism.  Comparison Study: No previous exams Performing Technologist: Jody Hill RVT, RDMS  Examination Guidelines: A complete evaluation includes B-mode imaging, spectral Doppler, color Doppler, and power Doppler as needed of all accessible portions of each vessel. Bilateral testing is considered an integral part of a complete examination. Limited  examinations for reoccurring indications may be performed as noted. The reflux portion of the exam is performed with the patient in reverse Trendelenburg.  +---------+---------------+---------+-----------+----------+--------------+ RIGHT    CompressibilityPhasicitySpontaneityPropertiesThrombus Aging +---------+---------------+---------+-----------+----------+--------------+ CFV      Full           Yes      Yes                                 +---------+---------------+---------+-----------+----------+--------------+ SFJ      Full                                                        +---------+---------------+---------+-----------+----------+--------------+  A2C: 49.4 ml LV vol s, MOD A4C: 43.6 ml LV SV MOD A2C:     49.3 ml LV SV MOD A4C:     100.0 ml LV SV MOD BP:      54.7 ml RIGHT VENTRICLE             IVC RV Basal diam:  2.70 cm     IVC diam: 1.40 cm RV S prime:     20.00 cm/s TAPSE (M-mode): 2.2 cm LEFT ATRIUM             Index        RIGHT ATRIUM          Index LA diam:        2.90 cm 1.44 cm/m   RA Area:     7.43 cm LA Vol (A2C):   34.7 ml 17.21 ml/m  RA Volume:   9.46 ml  4.69 ml/m LA Vol (A4C):   31.4 ml 15.57 ml/m LA Biplane Vol: 30.9 ml 15.32 ml/m  AORTIC VALVE LVOT Vmax:   130.00 cm/s LVOT Vmean:  87.200 cm/s LVOT VTI:    0.259 m AI PHT:      842 msec  AORTA Ao Root diam: 3.60 cm Ao Asc diam:  3.80 cm MITRAL VALVE               TRICUSPID VALVE MV Area (PHT): 1.93 cm    TR Peak grad:   7.2 mmHg MV Decel Time: 394 msec    TR Vmax:        134.00 cm/s MV E velocity: 50.10 cm/s MV A velocity: 93.80 cm/s  SHUNTS MV E/A ratio:  0.53        Systemic VTI:  0.26 m                            Systemic Diam: 2.50 cm [name] MD Electronically signed by [name] MD Signature Date/Time: 04/15/2023/3:27:38 PM    Final    VAS [place] LOWER EXTREMITY VENOUS (DVT)  Result Date: 04/15/2023  Lower Venous DVT Study Patient Name:  Samuel [name]  Date of Exam:   04/15/2023 Medical Rec #: [id number]    Accession #:    [id number] Date of Birth: 04-15-1946    Patient Gender: M Patient Age:   77 years Exam Location:  [facility] Procedure:      VAS [place] LOWER EXTREMITY VENOUS (DVT) Referring Phys: [facility] GOEL --------------------------------------------------------------------------------  Indications: Pulmonary embolism.  Comparison Study: No previous exams Performing Technologist: Jody Hill RVT, RDMS  Examination Guidelines: A complete evaluation includes B-mode imaging, spectral Doppler, color Doppler, and power Doppler as needed of all accessible portions of each vessel. Bilateral testing is considered an integral part of a complete examination. Limited  examinations for reoccurring indications may be performed as noted. The reflux portion of the exam is performed with the patient in reverse Trendelenburg.  +---------+---------------+---------+-----------+----------+--------------+ RIGHT    CompressibilityPhasicitySpontaneityPropertiesThrombus Aging +---------+---------------+---------+-----------+----------+--------------+ CFV      Full           Yes      Yes                                 +---------+---------------+---------+-----------+----------+--------------+ SFJ      Full                                                        +---------+---------------+---------+-----------+----------+--------------+

## 2023-04-20 NOTE — Progress Notes (Signed)
Samuel [name]   DOB:1946/04/02   NW#:[id number]   MVH#:[id number]  Med/onc follow up   Subjective: Patient underwent PEG feeding tube placement by IR today, and will start tube feeds tonight.  He denies any other new complaints.   Objective:  Vitals:   04/20/23 1213 04/20/23 1727  BP: 134/74 (!) 129/56  Pulse: (!) 58 83  Resp: 18 15  Temp: 98.2 F (36.8 C) 98.1 F (36.7 C)  SpO2: 95% 95%    Body mass index is 32.91 kg/m.  Intake/Output Summary (Last 24 hours) at 04/20/2023 1821 Last data filed at 04/20/2023 1500 Gross per 24 hour  Intake 200 ml  Output 400 ml  Net -200 ml     Sclerae unicteric  Oropharynx clear  No peripheral adenopathy  Lungs clear -- no rales or rhonchi  Heart regular rate and rhythm  Abdomen benign, (+) PEG tube on left side   MSK no focal spinal tenderness, no peripheral edema  Neuro nonfocal   CBG (last 3)  Recent Labs    04/20/23 0550 04/20/23 1214 04/20/23 1723  GLUCAP 85 141* 82     Labs:   Urine Studies No results for input(s): "UHGB", "CRYS" in the last 72 hours.  Invalid input(s): "UACOL", "UAPR", "USPG", "UPH", "UTP", "UGL", "UKET", "UBIL", "UNIT", "UROB", "ULEU", "UEPI", "UWBC", "URBC", "UBAC", "CAST", "UCOM", "BILUA"  Basic Metabolic Panel: Recent Labs  Lab 04/15/23 1028 04/16/23 0101 04/16/23 0605 04/18/23 0449 04/19/23 0825 04/20/23 0315  NA  --  140 139 141 142 144  K  --  3.6 3.6 3.2* 3.4* 3.5  CL  --  107 106 108 111 108  CO2  --  23 23 22 23 23   GLUCOSE  --  82 78 83 101* 83  BUN  --  13 13 13 10 8   CREATININE  --  0.83 0.89 1.09 0.79 0.75  CALCIUM  --  8.5* 8.3* 8.4* 8.2* 9.1  MG 2.4 2.2  --  2.3  --   --    GFR Estimated Creatinine Clearance: 82.4 mL/min (by C-G formula based on SCr of 0.75 mg/dL). Liver Function Tests: Recent Labs  Lab 04/15/23 0302 04/16/23 0605 04/18/23 0449 04/19/23 0825 04/20/23 0315  AST 55* 50* 46* 40 37  ALT 66* 58* 53* 46* 41  ALKPHOS 48 42 48 48 46  BILITOT 1.9* 1.5*  1.6* 1.4* 1.2  PROT 5.2* 4.7* 5.0* 5.3* 4.9*  ALBUMIN 2.9* 2.5* 2.5* 2.6* 2.4*   Recent Labs  Lab 04/14/23 1416  LIPASE 41   No results for input(s): "AMMONIA" in the last 168 hours. Coagulation profile Recent Labs  Lab 04/15/23 0302 04/20/23 0745  INR 1.5* 1.4*    CBC: Recent Labs  Lab 04/14/23 1416 04/15/23 0302 04/16/23 0605 04/17/23 0404 04/18/23 0449 04/19/23 0825  WBC 13.2* 11.0* 9.9 10.4 9.3 11.1*  NEUTROABS 9.4*  --   --   --   --   --   HGB 18.2* 15.2 15.3 15.4 16.4 16.9  HCT 53.8* 45.5 45.6 46.0 49.4 49.5  MCV 86.9 88.9 87.5 86.3 87.9 87.0  PLT 211 164 153 149* 162 175   Cardiac Enzymes: No results for input(s): "CKTOTAL", "CKMB", "CKMBINDEX", "TROPONINI" in the last 168 hours. BNP: Invalid input(s): "POCBNP" CBG: Recent Labs  Lab 04/19/23 1607 04/19/23 2106 04/20/23 0550 04/20/23 1214 04/20/23 1723  GLUCAP 98 94 85 141* 82   D-Dimer No results for input(s): "DDIMER" in the last 72 hours. Hgb A1c No results for input(s): "HGBA1C"  in the last 72 hours. Lipid Profile No results for input(s): "CHOL", "HDL", "LDLCALC", "TRIG", "CHOLHDL", "LDLDIRECT" in the last 72 hours. Thyroid function studies No results for input(s): "TSH", "T4TOTAL", "T3FREE", "THYROIDAB" in the last 72 hours.  Invalid input(s): "FREET3" Anemia work up No results for input(s): "VITAMINB12", "FOLATE", "FERRITIN", "TIBC", "IRON", "RETICCTPCT" in the last 72 hours. Microbiology No results found for this or any previous visit (from the past 240 hour(s)).    Studies:  CT ABDOMEN PELVIS W CONTRAST  Result Date: 04/19/2023 CLINICAL DATA:  Colon cancer metastatic disease. EXAM: CT ABDOMEN AND PELVIS WITH CONTRAST TECHNIQUE: Multidetector CT imaging of the abdomen and pelvis was performed using the standard protocol following bolus administration of intravenous contrast. RADIATION DOSE REDUCTION: This exam was performed according to the departmental dose-optimization program which  includes automated exposure control, adjustment of the mA and/or kV according to patient size and/or use of iterative reconstruction technique. CONTRAST:  75mL OMNIPAQUE IOHEXOL 350 MG/ML SOLN COMPARISON:  None. FINDINGS: Lower chest: There are trace bilateral pleural effusions with bibasilar atelectasis. Hepatobiliary: Small gallstone is present. There is no biliary ductal dilatation. There is fatty infiltration of the liver. No focal liver lesions are seen. Pancreas: There is a low-attenuation/cystic area in the pancreatic head measuring 1.7 x 1.2 cm. There is mild diffuse pancreatic atrophy. There is no pancreatic ductal dilatation or surrounding inflammation. Spleen: Normal in size without focal abnormality. Adrenals/Urinary Tract: There is a lobulated left renal cyst measuring 8.1 x 6.1 cm. Otherwise, the kidneys, adrenal glands and bladder are within normal limits. Stomach/Bowel: Stomach is within normal limits. Appendix appears normal. No evidence of bowel wall thickening, distention, or inflammatory changes. Focal colonic mass not visualized on this study. There is diffuse colonic diverticulosis. There is wall thickening of the distal esophagus with mild surrounding inflammation. Vascular/Lymphatic: Aortic atherosclerosis. No enlarged abdominal or pelvic lymph nodes. Reproductive: Prostate gland is enlarged. Other: No abdominal wall hernia or abnormality. No abdominopelvic ascites. Musculoskeletal: There is a subtle sclerotic focus within the L5 vertebral body which is rounded measuring proximally 2 cm. No acute fractures are seen. IMPRESSION: 1. No evidence for metastatic disease in the abdomen or pelvis. 2. Wall thickening of the distal esophagus with mild surrounding inflammation worrisome for esophagitis. 3. Trace bilateral pleural effusions with bibasilar atelectasis. 4. Cholelithiasis. 5. Colonic diverticulosis. 6. Indeterminate sclerotic focus in the L5 vertebral body. Metastatic disease not excluded.  7. 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 8. Left Bosniak I benign renal cyst measuring 8.1 cm. No follow-up imaging is recommended. JACR 2018 Feb; 264-273, Management of the Incidental Renal Mass on CT, [employer] 2021; 814-848, Bosniak Classification of Cystic Renal Masses, Version 2019. Aortic Atherosclerosis (ICD10-I70.0). Electronically Signed   By: Darliss Cheney M.D.   On: 04/19/2023 20:18    Assessment: 77 year old gentleman with past medical history of prediabetes and arthritis presented with worsening dysphagia   Distal esophageal adenocarcinoma  Severe dysphagia and malnutrition, s/p PEG placement on 10/17 Acute PE with right heart strain prediabetes Mild transaminitis and hyperbilirubinemia Intermittent Mobitz II AV block       Plan:  -I reviewed his esophageal mass biopsy, which showed adenocarcinoma, discussed with patient. -I reviewed his CT abdomen pelvis, which showed no evidence of metastasis, except an indeterminate bone lesion in lumbar spine.  I will get a PET scan for staging after his hospital discharge. -He had PEG feeding tube replaced today, will start tube feeds tonight. -Continue anticoagulation.  If he needs a pacemaker, I recommend stay on Lovenox injection, and transition to Eliquis before hospital discharge.  If the pacemaker is in upper lateral chest, this will be likely okay with radiation, not in the radiation field  -Will refer him to radiation oncology Dr. Mitzi Hansen after discharge -plan to start chemo and RT in 2-3 weeks from now -I will call his friend [place] per request.  -I will f/u as needed. Please inform me when he is ready to be discharged from hospital.    [name], MD 04/20/2023  6:21 PM

## 2023-04-21 DIAGNOSIS — I2699 Other pulmonary embolism without acute cor pulmonale: Secondary | ICD-10-CM | POA: Diagnosis not present

## 2023-04-21 DIAGNOSIS — E860 Dehydration: Secondary | ICD-10-CM | POA: Diagnosis not present

## 2023-04-21 DIAGNOSIS — R1319 Other dysphagia: Secondary | ICD-10-CM | POA: Diagnosis not present

## 2023-04-21 DIAGNOSIS — R001 Bradycardia, unspecified: Secondary | ICD-10-CM | POA: Diagnosis not present

## 2023-04-21 DIAGNOSIS — I441 Atrioventricular block, second degree: Secondary | ICD-10-CM | POA: Diagnosis not present

## 2023-04-21 LAB — GLUCOSE, CAPILLARY
Glucose-Capillary: 116 mg/dL — ABNORMAL HIGH (ref 70–99)
Glucose-Capillary: 139 mg/dL — ABNORMAL HIGH (ref 70–99)
Glucose-Capillary: 147 mg/dL — ABNORMAL HIGH (ref 70–99)
Glucose-Capillary: 178 mg/dL — ABNORMAL HIGH (ref 70–99)
Glucose-Capillary: 185 mg/dL — ABNORMAL HIGH (ref 70–99)
Glucose-Capillary: 189 mg/dL — ABNORMAL HIGH (ref 70–99)
Glucose-Capillary: 193 mg/dL — ABNORMAL HIGH (ref 70–99)

## 2023-04-21 LAB — PHOSPHORUS
Phosphorus: 2.1 mg/dL — ABNORMAL LOW (ref 2.5–4.6)
Phosphorus: 2.1 mg/dL — ABNORMAL LOW (ref 2.5–4.6)

## 2023-04-21 LAB — SURGICAL PCR SCREEN
MRSA, PCR: NEGATIVE
Staphylococcus aureus: NEGATIVE

## 2023-04-21 LAB — MAGNESIUM
Magnesium: 2.2 mg/dL (ref 1.7–2.4)
Magnesium: 2.3 mg/dL (ref 1.7–2.4)

## 2023-04-21 MED ORDER — SODIUM CHLORIDE 0.9% FLUSH
3.0000 mL | Freq: Two times a day (BID) | INTRAVENOUS | Status: DC
  Administered 2023-04-21 – 2023-04-24 (×4): 3 mL via INTRAVENOUS

## 2023-04-21 MED ORDER — PROSOURCE TF20 ENFIT COMPATIBL EN LIQD
60.0000 mL | Freq: Every day | ENTERAL | Status: DC
  Administered 2023-04-21 – 2023-04-23 (×3): 60 mL
  Filled 2023-04-21 (×5): qty 60

## 2023-04-21 MED ORDER — OSMOLITE 1.5 CAL PO LIQD
1000.0000 mL | ORAL | Status: DC
  Administered 2023-04-21 – 2023-04-22 (×2): 1000 mL
  Filled 2023-04-21 (×6): qty 1000

## 2023-04-21 MED ORDER — FREE WATER
150.0000 mL | Status: DC
  Administered 2023-04-21 – 2023-04-23 (×13): 150 mL

## 2023-04-21 MED ORDER — HYDROCODONE-ACETAMINOPHEN 5-325 MG PO TABS
1.0000 | ORAL_TABLET | ORAL | Status: DC | PRN
  Administered 2023-04-21: 2
  Administered 2023-04-21: 1
  Administered 2023-04-22 (×2): 2
  Administered 2023-04-22 (×2): 1
  Administered 2023-04-22 – 2023-04-23 (×3): 2
  Administered 2023-04-23 – 2023-04-25 (×3): 1
  Filled 2023-04-21: qty 2
  Filled 2023-04-21 (×2): qty 1
  Filled 2023-04-21 (×3): qty 2
  Filled 2023-04-21 (×2): qty 1
  Filled 2023-04-21: qty 2
  Filled 2023-04-21 (×2): qty 1
  Filled 2023-04-21: qty 2

## 2023-04-21 MED ORDER — SODIUM CHLORIDE 0.9% FLUSH
3.0000 mL | Freq: Two times a day (BID) | INTRAVENOUS | Status: DC
  Administered 2023-04-21 – 2023-04-24 (×6): 3 mL via INTRAVENOUS

## 2023-04-21 MED ORDER — POTASSIUM & SODIUM PHOSPHATES 280-160-250 MG PO PACK
1.0000 | PACK | Freq: Three times a day (TID) | ORAL | Status: DC
  Administered 2023-04-21 – 2023-04-24 (×11): 1 via ORAL
  Filled 2023-04-21 (×12): qty 1

## 2023-04-21 NOTE — Progress Notes (Signed)
PT Cancellation Note  Patient Details Name: Chato Laatsch MRN: [id number] DOB: 1945/08/30   Cancelled Treatment:    Reason Eval/Treat Not Completed: (P) Patient declined, no reason specified (Pt reports pain when breathing and fatigue. Pt politely declines therapy at this time. Will continue following per PT POC.)   Johny Shock 04/21/2023, 3:49 PM

## 2023-04-21 NOTE — TOC Initial Note (Signed)
Transition of Care (TOC) - Initial/Assessment Note  Donn Pierini RN, BSN Transitions of Care Unit 4E- RN Case Manager See Treatment Team for direct phone #   Patient Details  Name: Samuel [name] MRN: [id number] Date of Birth: [birth year]  Transition of Care [facility]) CM/SW Contact:    Darrold Span, RN Phone Number: 04/21/2023, 3:05 PM  Clinical Narrative:                 Pt s/p PEG placement yesterday and RD following for TF needs. Pt will need to be set up with home TF prior to discharge (plan to change to bolus feeds for home).   Request to call pt's friend Maralyn Sago received from CSW- call made and discussed transition needs with Maralyn Sago. Pt does not have a history with HH, Maralyn Sago feels pt will need RW for home on discharge. Per Maralyn Sago pt has a "village" of friends that will be assisting him on return home.  Explained to Maralyn Sago that per MD pt plan will be for PPM on Monday, so EDD at this time is potentially for Tues. Discussed with Maralyn Sago that CM will work on home TF arrangements and that education will be provided for home TF needs prior to discharge. Sarah voiced understanding.  Pt does not have preference for HH/DME needs/provider.   Call made to [facility] with [facility] to have them check pt's benefits to see if they can supply home TF needs. Pam will follow up once benefit check completed.   TOC to follow for home TF needs  Expected Discharge Plan: Home w Home Health Services Barriers to Discharge: Continued Medical Work up   Patient Goals and CMS Choice Patient states their goals for this hospitalization and ongoing recovery are:: return home with support of friends CMS Medicare.gov Compare Post Acute Care list provided to:: Patient Choice offered to / list presented to : Patient (friends)      Expected Discharge Plan and Services   Discharge Planning Services: CM Consult Post Acute Care Choice: Home Health, Durable Medical Equipment Living arrangements for the  past 2 months: Single Family Home                 DME Arranged: Walker rolling, Tube feeding                    Prior Living Arrangements/Services Living arrangements for the past 2 months: Single Family Home Lives with:: Self Patient language and need for interpreter reviewed:: Yes Do you feel safe going back to the place where you live?: Yes      Need for Family Participation in Patient Care: Yes (Comment) Care giver support system in place?: Yes (comment)   Criminal Activity/Legal Involvement Pertinent to Current Situation/Hospitalization: No - Comment as needed  Activities of Daily Living   ADL Screening (condition at time of admission) Independently performs ADLs?: Yes (appropriate for developmental age) Is the patient deaf or have difficulty hearing?: Yes Does the patient have difficulty seeing, even when wearing glasses/contacts?: Yes Does the patient have difficulty concentrating, remembering, or making decisions?: No  Permission Sought/Granted   Permission granted to share information with : Yes, Verbal Permission Granted  Share Information with NAME: Maralyn Sago  Permission granted to share info w AGENCY: HH/DME  Permission granted to share info w Relationship: Friend     Emotional Assessment Appearance:: Appears stated age Attitude/Demeanor/Rapport: Engaged Affect (typically observed): Accepting Orientation: : Oriented to Self, Oriented to Place, Oriented to  Time, Oriented to  Situation Alcohol / Substance Use: Not Applicable Psych Involvement: No (comment)  Admission diagnosis:  Pulmonary embolism (HCC) [I26.99] Difficulty swallowing liquids [R13.10] Acute pulmonary embolism, unspecified pulmonary embolism type, unspecified whether acute cor pulmonale present [facility]) [I26.99] Patient Active Problem List   Diagnosis Date Noted   Malignant neoplasm of lower third of esophagus (HCC) 04/20/2023   AV block, Mobitz II 04/19/2023   Bradycardia 04/18/2023    Dysphagia 04/14/2023   Dehydration 04/14/2023   LFT elevation 04/14/2023   Acute pulmonary embolism (HCC) 04/14/2023   Swelling of clavicular region 04/14/2023   Pulmonary embolism (HCC) 04/14/2023   Troponin I above reference range 04/14/2023   Diabetes mellitus (HCC) 12/19/2022   Primary hypertension 02/01/2022   Prediabetes 06/15/2015   Preventative health care 06/15/2015   Psoriasis 06/15/2015   PCP:  [name], MD Pharmacy:   [facility] [phone] Gin[place] Otto, [place] - [address] AT [facility] [address] [place] [address] Phone: [phone] Fax: [phone]     Social Determinants of Health (SDOH) Social History: SDOH Screenings   Food Insecurity: No Food Insecurity (04/15/2023)  Housing: Low Risk  (04/15/2023)  Transportation Needs: No Transportation Needs (04/15/2023)  Utilities: Not At Risk (04/15/2023)  Alcohol Screen: Low Risk  (08/19/2022)  Depression (PHQ2-9): Low Risk  (12/15/2022)  Financial Resource Strain: Low Risk  (08/19/2022)  Physical Activity: Sufficiently Active (08/19/2022)  Social Connections: Moderately Integrated (08/19/2022)  Stress: No Stress Concern Present (08/19/2022)  Tobacco Use: Medium Risk (04/15/2023)   SDOH Interventions:     Readmission Risk Interventions     No data to display

## 2023-04-21 NOTE — Plan of Care (Signed)
[facility] Tonsillectomy/Adenoidectomy, Postoperative PEDS care plan entered in error.

## 2023-04-21 NOTE — Progress Notes (Signed)
Initial Nutrition Assessment  DOCUMENTATION CODES:   Severe malnutrition in context of chronic illness  INTERVENTION:   TF via PEG tube: Transition to Osmolite 1.5 at 40ml and advance by 10ml q6h to a goal rate of 11ml/h ( per day) 60ml ProSource TF once daily free water flushes q4h ( total) Goal tube feeding regimen provides: 2240 kcal, 110g protein and 1997 total free water daily  Monitor magnesium, potassium, and phosphorus BID for at least 3 days, MD to replete as needed, as pt is at risk for refeeding syndrome   Prior to discharge can transition to bolus regimen: 1 carton ( ) Osmolite 1.5-- 6 times daily Start with 1/2 carton ( ) for first bolus, if tolerates well can increased to 1 carton ( ) for each following scheduled bolus 60ml water flush before and after each bolus ( total) Provides 2130 kcal, 90g protein and free water  NUTRITION DIAGNOSIS:   Severe Malnutrition related to chronic illness, cancer and cancer related treatments as evidenced by percent weight loss, energy intake < or equal to 75% for > or equal to 1 month, mild fat depletion, severe muscle depletion, mild muscle depletion.  GOAL:   Patient will meet greater than or equal to 90% of their needs  MONITOR:   Labs, Weight trends, TF tolerance  REASON FOR ASSESSMENT:   Consult Enteral/tube feeding initiation and management (new G-tube)  ASSESSMENT:   Pt admitted with worsening symptoms of dysphagia, initially with solids transitioning to liquids about 2 weeks PTA progressing to inability to tolerate any PO without pleuritic chest pain.  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 He reports that he was eating at  his baseline up until about 5-6 weeks ago. When he is doing well, he typically consumes 3 meals per day including eggs, bacon, toast and tea for breakfast. Lunch typically consists of a sandwich. Dinner he usually enjoys fish and potatoes or other various items. He does not drink sodas but enjoys sparkling waters.   Over the last 5-6 weeks, he has noticed a significant decline in his ability to tolerate PO intake. He initially tolerated solids, which downgraded to liquids until he was unable to tolerate any PO over the last week to two.   Pt has not self weighed recently however he reports that his last known usual weight was >/=200 lbs. He suspects at least a 30 lb weight loss within the last 5-6 weeks. Per review of weight history, pt noted to have had a weight loss of 18.1% since 06/13 which is clinically significant for time frame.   Medications: SSI 0-15 units TID, protonix 40mg  daily  Labs: phos 2.1 (L), Mg 2.2 (WDL)  CBG's 82-193 x24 hours  NUTRITION - FOCUSED PHYSICAL EXAM:  Flowsheet Row Most Recent Value  Orbital Region Moderate depletion  Upper Arm Region Mild depletion  Thoracic and Lumbar Region No depletion  Buccal Region No depletion  Temple Region Mild depletion  Clavicle Bone Region Mild depletion  Clavicle and Acromion Bone Region No depletion  Scapular Bone Region Mild depletion  Dorsal Hand Mild depletion  Patellar Region Severe depletion  Anterior Thigh Region Severe depletion  Posterior Calf Region Moderate depletion  Edema (RD Assessment) None  Hair Reviewed  Eyes Reviewed  Mouth Reviewed  Skin Reviewed  Nails Reviewed       Diet Order:   Diet Order             Diet NPO time specified Except for: Sips with Meds  Diet effective now                   EDUCATION NEEDS:   Education needs have been addressed  Skin:  Skin Assessment: Reviewed RN Assessment  Last BM:  10/14  Height:   Ht Readings from Last 1 Encounters:  04/18/23 5\' 6"  (1.676  m)    Weight:   Wt Readings from Last 1 Encounters:  04/21/23 75.8 kg   BMI:  Body mass index is 26.97 kg/m.  Estimated Nutritional Needs:   Kcal:  2100-2300  Protein:  110-125g  Fluid:  >/=2L  Drusilla Kanner, RDN, LDN Clinical Nutrition

## 2023-04-21 NOTE — Progress Notes (Signed)
PROGRESS NOTE    Samuel [name]  QIH:[id number] DOB: Dec 03, 1945 DOA: 04/14/2023 PCP: [name], MD   Brief Narrative:  Patient 77 year old white male with insidious onset of dysphagia, initially solids transitioning to liquids approximately 2 weeks ago now unable to tolerate any p.o. without profound pleuritic chest pain.  Initial imaging remarkable for right-sided PE, EKG showing secondary AV block with symptomatic bradycardia ultimately transferred to [facility] after requiring atropine for evaluation by cardiology/EP for possible pacemaker evaluation.  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, biopsies taken 10/16 CT abdomen pelvis pending for possible percutaneous gastric tube placement, TPN initiated in the interim 10/17 G-tube placement per IR 10/18 pacemaker placement planning ongoing -tentative date 04/24/23  Assessment & Plan:   Principal Problem:   Acute pulmonary embolism (HCC) Active Problems:   Dysphagia   Dehydration   LFT elevation   Swelling of clavicular region   Pulmonary embolism (HCC)   Troponin I above reference range   Bradycardia   AV block, Mobitz II   Malignant neoplasm of lower third of esophagus (HCC)  Adenocarcinoma of the esophagus Concurrent profound dysphagia -Biopsy confirms intramucosal adenocarcinoma -Patient is not a candidate for esophageal stent or NG tube placement given stricture/anatomy  -G-tube placement today 10/17 per IR -CT abdomen without clear evidence for metastatic disease of the abdomen or pelvis, notable indeterminate focus of the L5 vertebral body but otherwise notable pancreatic and renal cyst -Oncology following, plan for neoadjuvant chemo and radiation with ultimate goal of esophagectomy if possible -PET scan for staging after hospital discharge  Acute PE with right heart strain -Transition to  DOAC (Eliquis) -Presumed to be provoked secondary to questionable malignancy as above   Intermittent Mobitz II AV block , 2:1 which is responsive to ambulation -Asymptomatic, echo shows normal ventricular function  -EP consult confirms need for pacemaker placement - medically stable from my end for procedure - per discussion with med/rads onc left sided placement should not interfere with future radiation therapy.  Mild hypokalemia, resolved Continue to follow   Prediabetes A1c 6.2 Metformin allegedly causes diarrhea Initiate tube feeds to avoid hypoglycemia once approved   Subclinical hyperthyroidism TSH depressed, T4 within normal limits Follow outpatient for repeat labs   Hyperbilirubinemia, elevated AST/ALT  Asymptomatic, ultrasound remarkable for sludge and questionable gallstones No indication for surgery given minimal to no symptoms Discontinue antibiotics  DVT prophylaxis: SCDs Start: 04/14/23 2012 apixaban (ELIQUIS) tablet 5 mg Code Status:   Code Status: Full Code Family Communication: At bedside  Status is: Inpatient  Dispo: The patient is from: Home              Anticipated d/c is to: Home              Anticipated d/c date is: 48 to 72 hours              Patient currently not medically stable for discharge  Consultants:  GI, oncology, IR, GenSx, cardiology, EP  Procedures:  EGD, PEG placement, Pacemaker placement  Antimicrobials:  None  Subjective: No acute issues or events overnight, remains in good spirits denies headache fevers chills nausea vomiting diarrhea constipation  Objective: Vitals:   04/21/23 0033 04/21/23 0400 04/21/23 0442 04/21/23 0448  BP: 130/72   133/63  Pulse: (!) 54 79    Resp: 17 17  19   Temp: 97.9 F (36.6 C)   (!) 97.5 F (36.4  C)  TempSrc: Oral   Oral  SpO2: 97% 96%  96%  Weight:   75.8 kg   Height:        Intake/Output Summary (Last 24 hours) at 04/21/2023 0736 Last data filed at 04/21/2023 0400 Gross per 24 hour   Intake 206 ml  Output 625 ml  Net -419 ml    Filed Weights   04/14/23 1247 04/18/23 0940 04/21/23 0442  Weight: 92.5 kg 92.5 kg 75.8 kg    Examination:  General exam: Appears calm and comfortable  Respiratory system: Clear to auscultation. Respiratory effort normal. Cardiovascular system: S1 & S2 heard, RRR. No JVD, murmurs, rubs, gallops or clicks. No pedal edema. Gastrointestinal system: G-tube site clean/dry/intact Central nervous system: Alert and oriented. No focal neurological deficits. Extremities: Symmetric 5 x 5 power. Skin: No rashes, lesions or ulcers  Data Reviewed: I have personally reviewed following labs and imaging studies  CBC: Recent Labs  Lab 04/14/23 1416 04/15/23 0302 04/16/23 0605 04/17/23 0404 04/18/23 0449 04/19/23 0825  WBC 13.2* 11.0* 9.9 10.4 9.3 11.1*  NEUTROABS 9.4*  --   --   --   --   --   HGB 18.2* 15.2 15.3 15.4 16.4 16.9  HCT 53.8* 45.5 45.6 46.0 49.4 49.5  MCV 86.9 88.9 87.5 86.3 87.9 87.0  PLT 211 164 153 149* 162 175   Basic Metabolic Panel: Recent Labs  Lab 04/15/23 1028 04/16/23 0101 04/16/23 0605 04/18/23 0449 04/19/23 0825 04/20/23 0315  NA  --  140 139 141 142 144  K  --  3.6 3.6 3.2* 3.4* 3.5  CL  --  107 106 108 111 108  CO2  --  23 23 22 23 23   GLUCOSE  --  82 78 83 101* 83  BUN  --  13 13 13 10 8   CREATININE  --  0.83 0.89 1.09 0.79 0.75  CALCIUM  --  8.5* 8.3* 8.4* 8.2* 9.1  MG 2.4 2.2  --  2.3  --   --    GFR: Estimated Creatinine Clearance: 69.8 mL/min (by C-G formula based on SCr of 0.75 mg/dL). Liver Function Tests: Recent Labs  Lab 04/15/23 0302 04/16/23 0605 04/18/23 0449 04/19/23 0825 04/20/23 0315  AST 55* 50* 46* 40 37  ALT 66* 58* 53* 46* 41  ALKPHOS 48 42 48 48 46  BILITOT 1.9* 1.5* 1.6* 1.4* 1.2  PROT 5.2* 4.7* 5.0* 5.3* 4.9*  ALBUMIN 2.9* 2.5* 2.5* 2.6* 2.4*   Recent Labs  Lab 04/14/23 1416  LIPASE 41   Coagulation Profile: Recent Labs  Lab 04/15/23 0302 04/20/23 0745  INR  1.5* 1.4*   CBG: Recent Labs  Lab 04/20/23 1214 04/20/23 1723 04/20/23 2236 04/21/23 0037 04/21/23 0435  GLUCAP 141* 82 116* 116* 139*    No results found for this or any previous visit (from the past 240 hour(s)).   Radiology Studies: CT ABDOMEN PELVIS W CONTRAST  Result Date: 04/19/2023 CLINICAL DATA:  Colon cancer metastatic disease. EXAM: CT ABDOMEN AND PELVIS WITH CONTRAST TECHNIQUE: Multidetector CT imaging of the abdomen and pelvis was performed using the standard protocol following bolus administration of intravenous contrast. RADIATION DOSE REDUCTION: This exam was performed according to the departmental dose-optimization program which includes automated exposure control, adjustment of the mA and/or kV according to patient size and/or use of iterative reconstruction technique. CONTRAST:  75mL OMNIPAQUE IOHEXOL 350 MG/ML SOLN COMPARISON:  None. FINDINGS: Lower chest: There are trace bilateral pleural effusions with bibasilar atelectasis. Hepatobiliary: Small  gallstone is present. There is no biliary ductal dilatation. There is fatty infiltration of the liver. No focal liver lesions are seen. Pancreas: There is a low-attenuation/cystic area in the pancreatic head measuring 1.7 x 1.2 cm. There is mild diffuse pancreatic atrophy. There is no pancreatic ductal dilatation or surrounding inflammation. Spleen: Normal in size without focal abnormality. Adrenals/Urinary Tract: There is a lobulated left renal cyst measuring 8.1 x 6.1 cm. Otherwise, the kidneys, adrenal glands and bladder are within normal limits. Stomach/Bowel: Stomach is within normal limits. Appendix appears normal. No evidence of bowel wall thickening, distention, or inflammatory changes. Focal colonic mass not visualized on this study. There is diffuse colonic diverticulosis. There is wall thickening of the distal esophagus with mild surrounding inflammation. Vascular/Lymphatic: Aortic atherosclerosis. No enlarged abdominal or  pelvic lymph nodes. Reproductive: Prostate gland is enlarged. Other: No abdominal wall hernia or abnormality. No abdominopelvic ascites. Musculoskeletal: There is a subtle sclerotic focus within the L5 vertebral body which is rounded measuring proximally 2 cm. No acute fractures are seen. IMPRESSION: 1. No evidence for metastatic disease in the abdomen or pelvis. 2. Wall thickening of the distal esophagus with mild surrounding inflammation worrisome for esophagitis. 3. Trace bilateral pleural effusions with bibasilar atelectasis. 4. Cholelithiasis. 5. Colonic diverticulosis. 6. Indeterminate sclerotic focus in the L5 vertebral body. Metastatic disease not excluded. 7. 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 8. Left Bosniak I benign renal cyst measuring 8.1 cm. No follow-up imaging is recommended. JACR 2018 Feb; 264-273, Management of the Incidental Renal Mass on CT, [employer] 2021; 814-848, Bosniak Classification of Cystic Renal Masses, Version 2019. Aortic Atherosclerosis (ICD10-I70.0). Electronically Signed   By: Darliss Cheney M.D.   On: 04/19/2023 20:18    Scheduled Meds:  apixaban  5 mg Per Tube BID   guaiFENesin  30 mL Per Tube BID   influenza vaccine adjuvanted  0.5 mL Intramuscular Tomorrow-1000   insulin aspart  0-15 Units Subcutaneous TID WC   pantoprazole (PROTONIX) IV  40 mg Intravenous QHS   Continuous Infusions:  feeding supplement (OSMOLITE 1.2 CAL) 40 mL/hr at 04/21/23 0242     LOS: 7 days   Time spent:  Azucena Fallen, DO [facility]  If 7PM-7AM, please contact night-coverage [email or web address]  04/21/2023, 7:36 AM

## 2023-04-21 NOTE — Progress Notes (Signed)
Rounding Note    Patient Name: Samuel [name] Date of Encounter: 04/21/2023  [facility] Cardiologist: [name], MD   Subjective   NAEO, s/p PEG yesterday  Inpatient Medications    Scheduled Meds:  apixaban  5 mg Per Tube BID   guaiFENesin  30 mL Per Tube BID   insulin aspart  0-15 Units Subcutaneous TID WC   pantoprazole (PROTONIX) IV  40 mg Intravenous QHS   Continuous Infusions:  feeding supplement (OSMOLITE 1.2 CAL) 50 mL/hr at 04/21/23 0700    PRN Meds: acetaminophen **OR** acetaminophen, HYDROcodone-acetaminophen, polyethylene glycol   Vital Signs    Vitals:   04/21/23 0448 04/21/23 0738 04/21/23 1121 04/21/23 1245  BP: 133/63 126/77 126/73   Pulse:  83 83 95  Resp: 19 19 (!) 21 19  Temp: (!) 97.5 F (36.4 C) 98 F (36.7 C) 98.2 F (36.8 C)   TempSrc: Oral Oral Oral   SpO2: 96% 96% 93% 96%  Weight:      Height:        Intake/Output Summary (Last 24 hours) at 04/21/2023 1354 Last data filed at 04/21/2023 0700 Gross per 24 hour  Intake 575 ml  Output 225 ml  Net 350 ml      04/21/2023    4:42 AM 04/18/2023    9:40 AM 04/14/2023   12:47 PM  Last 3 Weights  Weight (lbs) 167 lb 1.7 oz 203 lb 14.8 oz 203 lb 14.8 oz  Weight (kg) 75.8 kg 92.5 kg 92.5 kg      Telemetry    HRs generally look better, spending more time 1:1 conduction with rates 70's or better, continues to have intermittent 2:1 AVblock 29-30's slower rates overnight with sleep, - Personally Reviewed  ECG    No new EKGs - Personally Reviewed  Physical Exam   Seen/examined by Dr. Jimmey Ralph this morning GEN: No acute distress.   Neck: No JVD Cardiac: RRR, no murmurs, rubs, or gallops.  Respiratory: CTA b/l. GI: Soft, nontender  MS: No edema; No deformity. Neuro:  Nonfocal  Psych: Normal affect   Labs    High Sensitivity Troponin:   Recent Labs  Lab 04/14/23 1416 04/14/23 1752 04/16/23 0101  TROPONINIHS 56* 67* 67*     Chemistry Recent Labs  Lab  04/16/23 0101 04/16/23 0605 04/18/23 0449 04/19/23 0825 04/20/23 0315 04/21/23 1140  NA 140   < > 141 142 144  --   K 3.6   < > 3.2* 3.4* 3.5  --   CL 107   < > 108 111 108  --   CO2 23   < > 22 23 23   --   GLUCOSE 82   < > 83 101* 83  --   BUN 13   < > 13 10 8   --   CREATININE 0.83   < > 1.09 0.79 0.75  --   CALCIUM 8.5*   < > 8.4* 8.2* 9.1  --   MG 2.2  --  2.3  --   --  2.2  PROT  --    < > 5.0* 5.3* 4.9*  --   ALBUMIN  --    < > 2.5* 2.6* 2.4*  --   AST  --    < > 46* 40 37  --   ALT  --    < > 53* 46* 41  --   ALKPHOS  --    < > 48 48 46  --  BILITOT  --    < > 1.6* 1.4* 1.2  --   GFRNONAA >60   < > >60 >60 >60  --   ANIONGAP 10   < > 11 8 13   --    < > = values in this interval not displayed.    Lipids No results for input(s): "CHOL", "TRIG", "HDL", "LABVLDL", "LDLCALC", "CHOLHDL" in the last 168 hours.  Hematology Recent Labs  Lab 04/17/23 0404 04/18/23 0449 04/19/23 0825  WBC 10.4 9.3 11.1*  RBC 5.33 5.62 5.69  HGB 15.4 16.4 16.9  HCT 46.0 49.4 49.5  MCV 86.3 87.9 87.0  MCH 28.9 29.2 29.7  MCHC 33.5 33.2 34.1  RDW 13.6 13.9 14.4  PLT 149* 162 175   Thyroid  Recent Labs  Lab 04/15/23 1750  TSH 0.242*  FREET4 1.28*    BNPNo results for input(s): "BNP", "PROBNP" in the last 168 hours.  DDimer No results for input(s): "DDIMER" in the last 168 hours.   Radiology      Cardiac Studies   04/15/23: TTE  1. Left ventricular ejection fraction, by estimation, is 55%. The left  ventricle has normal function. The left ventricle has no regional wall  motion abnormalities. Left ventricular diastolic parameters are consistent  with Grade I diastolic dysfunction  (impaired relaxation).   2. Right ventricular systolic function is normal. The right ventricular  size is normal. Tricuspid regurgitation signal is inadequate for assessing  PA pressure.   3. The mitral valve is normal in structure. Trivial mitral valve  regurgitation. No evidence of mitral  stenosis.   4. The aortic valve is grossly normal. Aortic valve regurgitation is mild  to moderate. Aortic valve sclerosis is present, with no evidence of aortic  valve stenosis.   5. The inferior vena cava is normal in size with greater than 50%  respiratory variability, suggesting right atrial pressure of 3 mmHg.   Patient Profile     77 y.o. male w/PMHx of DM admitted with progressive dysphagia to the point of unable to eat/drink anything, pleuritic CP  Found with acute b/l DVTs and PE >> heparin incidentally noted to have intermittent 2:1 AV block Long segment of esophageal stenosis/stricture  Assessment & Plan    Intermittent Mobitz II AVblock w/ 2:1 conduction, rare 3:1 No nodal blocking agents  He ambulated 10/14 and reported once he got started felt he did well, without lightheadedness dizziness. HR rose to the 70's  Continues to be able to raise HRs with just talking, moving in bed   Dr. Jimmey Ralph has seen the patient:  he has had now had evidence of transient higher AV  block, though without any obvious or new symptoms  S/p PEG with transition to [facility] today +/- radiation for his cancer, though L sided PPM will be find and out of the territory of XRT (Lateral chest implant would be out of the beam of radiation) I have tentatively placed him on Monday's schedule, if PEG is stable and tolerating feeds, etc, and otherwise ready to go home, will plan to proeceed with implant    As per attending team:   # acute PE #acute b/l LE DVTs On therapeutic lovenox pending Peg tube  # progressive dysphagia     Esophageal stenosis S/p EGD w/malignant appearing intrinsic severe stenosis was found 30 to 34 cm from incisors (biopsied > pending) Oncology is on board     ? R sternoclavicular swelling ?sclerotic lesion T2 noted on CT Pending evaluation for any  potential METS and his biopsy result (in my understandinhg of oncology note)     For questions or updates, please  contact [facility] Please consult [email or web address] for contact info under        Signed, Sheilah Pigeon, PA-C  04/21/2023, 1:54 PM

## 2023-04-21 NOTE — Progress Notes (Signed)
Mobility Specialist Progress Note:   04/21/23 1030  Mobility  Activity Ambulated with assistance in hallway  Level of Assistance Contact guard assist, steadying assist  Assistive Device Cane  Distance Ambulated (ft) 200 ft  Activity Response Tolerated well  Mobility Referral Yes  $Mobility charge 1 Mobility  Mobility Specialist Start Time (ACUTE ONLY) 0950  Mobility Specialist Stop Time (ACUTE ONLY) 1014  Mobility Specialist Time Calculation (min) (ACUTE ONLY) 24 min   During Mobility: 130 HR , 93% SpO2 Post Mobility: 110 HR , 94% SpO2  Pt received in bed, agreeable to mobility. SB to stand. CG during ambulation. Pt c/o feeling slightly foggy, otherwise asymptomatic throughout. Pt returned to bed with call bell in reach and all needs met. Family present.   [name]  Mobility Specialist Please contact via [employer] office at [phone]

## 2023-04-21 NOTE — Progress Notes (Signed)
[facility] Progress Note  SUBJECTIVE:   Interval history: Samuel [name] was seen and evaluated today at bedside. Resting comfortably in bed. We discussed findings of esophageal cancer on biopsy, he has already discussed this with Oncology. He is now status post PEG tube placement and appears to be tolerating tube feeds well. He noted having some retrosternal chest discomfort and esophageal discomfort. Bowel movement yesterday.   Past Medical History:  Diagnosis Date   Diabetes mellitus without complication [facility])    Past Surgical History:  Procedure Laterality Date   BIOPSY  04/18/2023   Procedure: BIOPSY;  Surgeon: Lynann Bologna, DO;  Location: [facility] ENDOSCOPY;  Service: Gastroenterology;;   ESOPHAGOGASTRODUODENOSCOPY N/A 04/18/2023   Procedure: ESOPHAGOGASTRODUODENOSCOPY (EGD);  Surgeon: Lynann Bologna, DO;  Location: [facility] ENDOSCOPY;  Service: Gastroenterology;  Laterality: N/A;   IR GASTROSTOMY TUBE MOD SED  04/20/2023   IR NASO G TUBE PLC W/FL W/RAD  04/20/2023   KNEE ARTHROPLASTY Bilateral    Current Facility-Administered Medications  Medication Dose Route Frequency Provider Last Rate Last Admin   acetaminophen (TYLENOL) tablet 650 mg  650 mg Per Tube Q6H PRN Azucena Fallen, MD   650 mg at 04/21/23 1542   Or   acetaminophen (TYLENOL) suppository 650 mg  650 mg Rectal Q6H PRN Azucena Fallen, MD       apixaban Everlene Balls) tablet 5 mg  5 mg Per Tube BID Azucena Fallen, MD   5 mg at 04/21/23 1029   feeding supplement (OSMOLITE 1.5 CAL) liquid 1,000 mL  1,000 mL Per Tube Continuous Azucena Fallen, MD 40 mL/hr at 04/21/23 1532 1,000 mL at 04/21/23 1532   feeding supplement (PROSource TF20) liquid 60 mL  60 mL Per Tube Daily Azucena Fallen, MD   60 mL at 04/21/23 1540   free water 150 mL  150 mL Per Tube Q4H Azucena Fallen, MD   150 mL at 04/21/23 1550   guaiFENesin (ROBITUSSIN) 100 MG/5ML liquid 30 mL  30 mL Per Tube BID Azucena Fallen,  MD   30 mL at 04/21/23 1029   HYDROcodone-acetaminophen (NORCO/VICODIN) 5-325 MG per tablet 1-2 tablet  1-2 tablet Per Tube Q4H PRN Azucena Fallen, MD   1 tablet at 04/21/23 1247   insulin aspart (novoLOG) injection 0-15 Units  0-15 Units Subcutaneous TID WC Liliane Shi H, DO   3 Units at 04/21/23 1140   pantoprazole (PROTONIX) injection 40 mg  40 mg Intravenous QHS Liliane Shi H, DO   40 mg at 04/20/23 2235   polyethylene glycol (MIRALAX / GLYCOLAX) packet 17 g  17 g Per Tube Daily PRN Azucena Fallen, MD       potassium & sodium phosphates (PHOS-NAK) 280-160-250 MG packet 1 packet  1 packet Oral TID WC & HS Azucena Fallen, MD       Allergies as of 04/14/2023 - Review Complete 04/14/2023  Allergen Reaction Noted   Penicillins  09/18/2011   Sulfa antibiotics  09/18/2011   Procaine Other (See Comments) 06/15/2015   Review of Systems:  Review of Systems  Respiratory:  Negative for shortness of breath.   Cardiovascular:  Positive for chest pain.  Gastrointestinal:  Negative for abdominal pain.    OBJECTIVE:   Temp:  [97.5 F (36.4 C)-98.2 F (36.8 C)] 97.8 F (36.6 C) (10/18 1549) Pulse Rate:  [54-95] 81 (10/18 1549) Resp:  [14-21] 19 (10/18 1549) BP: (117-133)/(56-78) 117/73 (10/18 1549) SpO2:  [93 %-98 %] 97 % (10/18  1549) Weight:  [75.8 kg] 75.8 kg (10/18 0442) Last BM Date : 04/17/23 Physical Exam Constitutional:      General: He is not in acute distress.    Appearance: He is not ill-appearing, toxic-appearing or diaphoretic.  Cardiovascular:     Rate and Rhythm: Normal rate and regular rhythm.  Pulmonary:     Effort: No respiratory distress.     Breath sounds: Normal breath sounds.  Abdominal:     General: Bowel sounds are normal. There is no distension.     Palpations: Abdomen is soft.     Tenderness: There is no abdominal tenderness. There is no guarding.     Comments: PEG tube noted with dressing atop, tube feeds running at 40 cc/hr.   Skin:    General: Skin is warm and dry.     Coloration: Skin is pale.  Neurological:     Mental Status: He is alert.     Labs: Recent Labs    04/19/23 0825  WBC 11.1*  HGB 16.9  HCT 49.5  PLT 175   BMET Recent Labs    04/19/23 0825 04/20/23 0315  NA 142 144  K 3.4* 3.5  CL 111 108  CO2 23 23  GLUCOSE 101* 83  BUN 10 8  CREATININE 0.79 0.75  CALCIUM 8.2* 9.1   LFT Recent Labs    04/20/23 0315  PROT 4.9*  ALBUMIN 2.4*  AST 37  ALT 41  ALKPHOS 46  BILITOT 1.2   PT/INR Recent Labs    04/20/23 0745  LABPROT 16.9*  INR 1.4*   Diagnostic imaging: IR GASTROSTOMY TUBE MOD SED  Result Date: 04/21/2023 INDICATION: Dysphagia.  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 EXAM: Procedures: 1. FLUOROSCOPIC-GUIDED NASOGASTRIC TUBE PLACEMENT 2. PERCUTANEOUS GASTROSTOMY TUBE PLACEMENT COMPARISON:  FL esophagram, 04/17/2023.  CT AP, 04/19/2023. MEDICATIONS: Vancomycin 1 gm IV; Antibiotics were administered within 1 hour of the procedure. CONTRAST:  15 mL of Isovue 300 administered into the gastric lumen. 0.5 mg glucagon IV ANESTHESIA/SEDATION: Moderate (conscious) sedation was employed during this procedure. A total of Versed 1 mg and Fentanyl 50 mcg was administered intravenously. Moderate Sedation Time: 13 minutes. The patient's level of consciousness and vital signs were monitored continuously by radiology nursing throughout the procedure under my direct supervision. FLUOROSCOPY TIME:  Fluoroscopic dose; 7 mGy COMPLICATIONS: None immediate. PROCEDURE: Informed written consent was obtained from the patient and/or patient's representative following explanation of the procedure, risks, benefits and alternatives. A time out was performed prior to the initiation of the procedure. Maximal barrier sterile technique utilized including caps, mask, sterile gowns, sterile gloves, large sterile drape, hand hygiene and sterile prep. The LEFT upper quadrant was sterilely prepped  and draped. A 5 Fr Kumpe as a oral gastric catheter was inserted into the stomach under fluoroscopy. The LEFT costal margin and barium opacified transverse colon were identified and avoided. Air was injected into the stomach for insufflation and visualization under fluoroscopy. Under sterile conditions and local anesthesia, 2T tacks were utilized to pexy the anterior aspect of the stomach against the ventral abdominal wall. Contrast injection confirmed appropriate positioning of each of the T tacks. An incision was made between the T tacks and a 17 gauge trocar needle was utilized to access the stomach. Needle position was confirmed within the stomach with aspiration of air and injection of a small amount of contrast. A guidewire was advanced into the gastric lumen and under intermittent fluoroscopic guidance, the access needle was exchanged for a telescoping peel-away  sheath, ultimately allowing placement of a 18 Fr balloon retention gastrostomy tube. The retention balloon was insufflated with a mixture of dilute saline and contrast and pulled taut against the anterior wall of the stomach. The external disc was cinched. Contrast injection confirms positioning within the stomach. Several spot radiographic images were obtained in various obliquities for documentation. The patient tolerated procedure well without immediate post procedural complication. FINDINGS: After successful fluoroscopic guided placement, the gastrostomy tube is appropriately positioned with internal retention balloon against the ventral aspect of the gastric lumen. IMPRESSION: Successful percutaneous insertion of an 18 Fr balloon retention gastrostomy tube. The gastrostomy may be used immediately for medication administration and in 4 hrs for the initiation of feeds. RECOMMENDATIONS: The patient will return to Vascular Interventional Radiology (VIR) for routine feeding tube evaluation and exchange in 6 months. Roanna Banning, MD [facility] Radiology Electronically Signed   By: Roanna Banning M.D.   On: 04/21/2023 09:05   IR Naso G Tube Plc W/FL W/Rad  Result Date: 04/21/2023 INDICATION: Dysphagia.  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 EXAM: Procedures: 1. FLUOROSCOPIC-GUIDED NASOGASTRIC TUBE PLACEMENT 2. PERCUTANEOUS GASTROSTOMY TUBE PLACEMENT COMPARISON:  FL esophagram, 04/17/2023.  CT AP, 04/19/2023. MEDICATIONS: Vancomycin 1 gm IV; Antibiotics were administered within 1 hour of the procedure. CONTRAST:  15 mL of Isovue 300 administered into the gastric lumen. 0.5 mg glucagon IV ANESTHESIA/SEDATION: Moderate (conscious) sedation was employed during this procedure. A total of Versed 1 mg and Fentanyl 50 mcg was administered intravenously. Moderate Sedation Time: 13 minutes. The patient's level of consciousness and vital signs were monitored continuously by radiology nursing throughout the procedure under my direct supervision. FLUOROSCOPY TIME:  Fluoroscopic dose; 7 mGy COMPLICATIONS: None immediate. PROCEDURE: Informed written consent was obtained from the patient and/or patient's representative following explanation of the procedure, risks, benefits and alternatives. A time out was performed prior to the initiation of the procedure. Maximal barrier sterile technique utilized including caps, mask, sterile gowns, sterile gloves, large sterile drape, hand hygiene and sterile prep. The LEFT upper quadrant was sterilely prepped and draped. A 5 Fr Kumpe as a oral gastric catheter was inserted into the stomach under fluoroscopy. The LEFT costal margin and barium opacified transverse colon were identified and avoided. Air was injected into the stomach for insufflation and visualization under fluoroscopy. Under sterile conditions and local anesthesia, 2T tacks were utilized to pexy the anterior aspect of the stomach against the ventral abdominal wall. Contrast injection confirmed  appropriate positioning of each of the T tacks. An incision was made between the T tacks and a 17 gauge trocar needle was utilized to access the stomach. Needle position was confirmed within the stomach with aspiration of air and injection of a small amount of contrast. A guidewire was advanced into the gastric lumen and under intermittent fluoroscopic guidance, the access needle was exchanged for a telescoping peel-away sheath, ultimately allowing placement of a 18 Fr balloon retention gastrostomy tube. The retention balloon was insufflated with a mixture of dilute saline and contrast and pulled taut against the anterior wall of the stomach. The external disc was cinched. Contrast injection confirms positioning within the stomach. Several spot radiographic images were obtained in various obliquities for documentation. The patient tolerated procedure well without immediate post procedural complication. FINDINGS: After successful fluoroscopic guided placement, the gastrostomy tube is appropriately positioned with internal retention balloon against the ventral aspect of the gastric lumen. IMPRESSION: Successful percutaneous insertion of an 18 Fr balloon retention gastrostomy  tube. The gastrostomy may be used immediately for medication administration and in 4 hrs for the initiation of feeds. RECOMMENDATIONS: The patient will return to Vascular Interventional Radiology (VIR) for routine feeding tube evaluation and exchange in 6 months. Roanna Banning, MD [facility] Radiology Electronically Signed   By: Roanna Banning M.D.   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

## 2023-04-22 DIAGNOSIS — I441 Atrioventricular block, second degree: Secondary | ICD-10-CM | POA: Diagnosis not present

## 2023-04-22 DIAGNOSIS — E43 Unspecified severe protein-calorie malnutrition: Secondary | ICD-10-CM | POA: Insufficient documentation

## 2023-04-22 LAB — GLUCOSE, CAPILLARY
Glucose-Capillary: 164 mg/dL — ABNORMAL HIGH (ref 70–99)
Glucose-Capillary: 182 mg/dL — ABNORMAL HIGH (ref 70–99)
Glucose-Capillary: 186 mg/dL — ABNORMAL HIGH (ref 70–99)
Glucose-Capillary: 188 mg/dL — ABNORMAL HIGH (ref 70–99)
Glucose-Capillary: 198 mg/dL — ABNORMAL HIGH (ref 70–99)
Glucose-Capillary: 204 mg/dL — ABNORMAL HIGH (ref 70–99)

## 2023-04-22 LAB — CBC
HCT: 51.2 % (ref 39.0–52.0)
Hemoglobin: 17 g/dL (ref 13.0–17.0)
MCH: 29 pg (ref 26.0–34.0)
MCHC: 33.2 g/dL (ref 30.0–36.0)
MCV: 87.4 fL (ref 80.0–100.0)
Platelets: 164 10*3/uL (ref 150–400)
RBC: 5.86 MIL/uL — ABNORMAL HIGH (ref 4.22–5.81)
RDW: 14.7 % (ref 11.5–15.5)
WBC: 12.3 10*3/uL — ABNORMAL HIGH (ref 4.0–10.5)
nRBC: 0 % (ref 0.0–0.2)

## 2023-04-22 LAB — PHOSPHORUS
Phosphorus: 1.8 mg/dL — ABNORMAL LOW (ref 2.5–4.6)
Phosphorus: 1.9 mg/dL — ABNORMAL LOW (ref 2.5–4.6)

## 2023-04-22 LAB — MAGNESIUM
Magnesium: 2.2 mg/dL (ref 1.7–2.4)
Magnesium: 2.2 mg/dL (ref 1.7–2.4)

## 2023-04-22 MED ORDER — ALUM & MAG HYDROXIDE-SIMETH 200-200-20 MG/5ML PO SUSP: 30.0000 mL | ORAL | Status: DC | PRN

## 2023-04-22 NOTE — Progress Notes (Signed)
Rounding Note    Patient Name: Samuel [name] Date of Encounter: 04/22/2023  [facility] Cardiologist: [name], MD   Subjective   No acute complaints.  Pain controlled with current medications.  Inpatient Medications    Scheduled Meds:  apixaban  5 mg Per Tube BID   feeding supplement (PROSource TF20)  60 mL Per Tube Daily   free water  150 mL Per Tube Q4H   guaiFENesin  30 mL Per Tube BID   insulin aspart  0-15 Units Subcutaneous TID WC   pantoprazole (PROTONIX) IV  40 mg Intravenous QHS   potassium & sodium phosphates  1 packet Oral TID WC & HS   sodium chloride flush  3 mL Intravenous Q12H   sodium chloride flush  3 mL Intravenous Q12H   Continuous Infusions:  feeding supplement (OSMOLITE 1.5 CAL) 60 mL/hr at 04/22/23 0421    PRN Meds: acetaminophen **OR** acetaminophen, alum & mag hydroxide-simeth, HYDROcodone-acetaminophen, polyethylene glycol   Vital Signs    Vitals:   04/22/23 0346 04/22/23 0700 04/22/23 0736 04/22/23 1105  BP: 119/73  119/82 117/79  Pulse: 64  96 95  Resp: 15  18 16   Temp: 98.4 F (36.9 C)  98 F (36.7 C)   TempSrc: Oral  Oral   SpO2: 91%  91% 92%  Weight:  78.9 kg    Height:        Intake/Output Summary (Last 24 hours) at 04/22/2023 1116 Last data filed at 04/22/2023 0421 Gross per 24 hour  Intake 1257.34 ml  Output 550 ml  Net 707.34 ml      04/22/2023    7:00 AM 04/21/2023    4:42 AM 04/18/2023    9:40 AM  Last 3 Weights  Weight (lbs) 173 lb 15.1 oz 167 lb 1.7 oz 203 lb 14.8 oz  Weight (kg) 78.9 kg 75.8 kg 92.5 kg      Telemetry    Sinus rhythm with one-to-one conduction, intermittent to 2 1 AV block nocturnal-personally reviewed   ECG    None new  Physical Exam   GEN: Well nourished, well developed, in no acute distress  HEENT: normal  Neck: no JVD, carotid bruits, or masses Cardiac: RRR; no murmurs, rubs, or gallops,no edema  Respiratory:  clear to auscultation bilaterally, normal work of  breathing GI: soft, nontender, nondistended, + BS MS: no deformity or atrophy  Skin: warm and dry Neuro:  Strength and sensation are intact Psych: euthymic mood, full affect   Labs    High Sensitivity Troponin:   Recent Labs  Lab 04/14/23 1416 04/14/23 1752 04/16/23 0101  TROPONINIHS 56* 67* 67*     Chemistry Recent Labs  Lab 04/18/23 0449 04/19/23 0825 04/20/23 0315 04/21/23 1140 04/21/23 1835 04/22/23 0805  NA 141 142 144  --   --   --   K 3.2* 3.4* 3.5  --   --   --   CL 108 111 108  --   --   --   CO2 22 23 23   --   --   --   GLUCOSE 83 101* 83  --   --   --   BUN 13 10 8   --   --   --   CREATININE 1.09 0.79 0.75  --   --   --   CALCIUM 8.4* 8.2* 9.1  --   --   --   MG 2.3  --   --  2.2 2.3 2.2  PROT 5.0* 5.3* 4.9*  --   --   --   ALBUMIN 2.5* 2.6* 2.4*  --   --   --   AST 46* 40 37  --   --   --   ALT 53* 46* 41  --   --   --   ALKPHOS 48 48 46  --   --   --   BILITOT 1.6* 1.4* 1.2  --   --   --   GFRNONAA >60 >60 >60  --   --   --   ANIONGAP 11 8 13   --   --   --     Lipids No results for input(s): "CHOL", "TRIG", "HDL", "LABVLDL", "LDLCALC", "CHOLHDL" in the last 168 hours.  Hematology Recent Labs  Lab 04/18/23 0449 04/19/23 0825 04/22/23 0805  WBC 9.3 11.1* 12.3*  RBC 5.62 5.69 5.86*  HGB 16.4 16.9 17.0  HCT 49.4 49.5 51.2  MCV 87.9 87.0 87.4  MCH 29.2 29.7 29.0  MCHC 33.2 34.1 33.2  RDW 13.9 14.4 14.7  PLT 162 175 164   Thyroid  Recent Labs  Lab 04/15/23 1750  TSH 0.242*  FREET4 1.28*    BNPNo results for input(s): "BNP", "PROBNP" in the last 168 hours.  DDimer No results for input(s): "DDIMER" in the last 168 hours.   Radiology      Cardiac Studies   04/15/23: TTE  1. Left ventricular ejection fraction, by estimation, is 55%. The left  ventricle has normal function. The left ventricle has no regional wall  motion abnormalities. Left ventricular diastolic parameters are consistent  with Grade I diastolic dysfunction  (impaired  relaxation).   2. Right ventricular systolic function is normal. The right ventricular  size is normal. Tricuspid regurgitation signal is inadequate for assessing  PA pressure.   3. The mitral valve is normal in structure. Trivial mitral valve  regurgitation. No evidence of mitral stenosis.   4. The aortic valve is grossly normal. Aortic valve regurgitation is mild  to moderate. Aortic valve sclerosis is present, with no evidence of aortic  valve stenosis.   5. The inferior vena cava is normal in size with greater than 50%  respiratory variability, suggesting right atrial pressure of 3 mmHg.   Patient Profile     77 y.o. male w/PMHx of DM admitted with progressive dysphagia to the point of unable to eat/drink anything, pleuritic CP  Found with acute b/l DVTs and PE >> heparin incidentally noted to have intermittent 2:1 AV block Long segment of esophageal stenosis/stricture  Assessment & Plan    1.  Intermittent Mobitz 1 AV block: Conducts when he is awake or being stimulated, but does have significant bradycardia.  Tentatively plan for pacemaker implant on Monday.  Perlie Stene make n.p.o. after midnight Sunday night.  Per primary team: PE with DVTs Progressive dysphagia post PEG    For questions or updates, please contact [facility] Please consult [email or web address] for contact info under        Signed, Wilkins Elpers Jorja Loa, MD  04/22/2023, 11:16 AM

## 2023-04-22 NOTE — Progress Notes (Addendum)
PROGRESS NOTE    Samuel [name]  XBJ:[id number] DOB: [birth year] DOA: 04/14/2023 PCP: [name], MD   Brief Narrative:  Patient 77 year old white male with insidious onset of dysphagia, initially solids transitioning to liquids approximately 2 weeks ago now unable to tolerate any p.o. without profound pleuritic chest pain.  Initial imaging remarkable for right-sided PE, EKG showing secondary AV block with symptomatic bradycardia ultimately transferred to [facility] after requiring atropine for evaluation by cardiology/EP for possible pacemaker evaluation.  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, biopsies taken 10/16 CT abdomen pelvis pending for possible percutaneous gastric tube placement, TPN initiated in the interim 10/17 G-tube placement per IR 10/18 pacemaker placement planning ongoing -tentative date 04/24/23  Assessment & Plan:   Principal Problem:   Acute pulmonary embolism (HCC) Active Problems:   Dysphagia   Dehydration   LFT elevation   Swelling of clavicular region   Pulmonary embolism (HCC)   Troponin I above reference range   Bradycardia   AV block, Mobitz II   Malignant neoplasm of lower third of esophagus (HCC)   Protein-calorie malnutrition, severe  Adenocarcinoma of the esophagus Concurrent profound dysphagia Severe protein-caloric malnutrition -Biopsy confirms intramucosal adenocarcinoma -Patient is not a candidate for esophageal stent or NG tube placement given stricture/anatomy  -G-tube placement today 10/17 per IR -CT abdomen without clear evidence for metastatic disease of the abdomen or pelvis, notable indeterminate focus of the L5 vertebral body but otherwise notable pancreatic and renal cyst -Oncology following, plan for neoadjuvant chemo and radiation with ultimate goal of esophagectomy if possible -PET scan for staging  after hospital discharge  At risk for refeeding syndrome  -Continue to monitor magnesium and phosphorus, supplementation ongoing  Acute PE with right heart strain -Transition to DOAC (Eliquis) -Presumed to be provoked secondary to questionable malignancy as above   Intermittent Mobitz II AV block , 2:1 which is responsive to ambulation -Asymptomatic, echo shows normal ventricular function  -EP consult confirms need for pacemaker placement - medically stable from my end for procedure - per discussion with med/rads onc left sided placement should not interfere with future radiation therapy.  Mild hypokalemia, resolved Continue to follow   Prediabetes A1c 6.2 Metformin allegedly causes diarrhea Initiate tube feeds to avoid hypoglycemia once approved   Subclinical hyperthyroidism TSH depressed, T4 within normal limits Follow outpatient for repeat labs   Hyperbilirubinemia, elevated AST/ALT  Asymptomatic, ultrasound remarkable for sludge and questionable gallstones No indication for surgery given minimal to no symptoms Discontinue antibiotics  DVT prophylaxis: SCDs Start: 04/14/23 2012 apixaban (ELIQUIS) tablet 5 mg Code Status:   Code Status: Full Code Family Communication: At bedside  Status is: Inpatient  Dispo: The patient is from: Home              Anticipated d/c is to: Home              Anticipated d/c date is: 48 to 72 hours              Patient currently not medically stable for discharge  Consultants:  GI, oncology, IR, GenSx, cardiology, EP  Procedures:  EGD, PEG placement, Pacemaker placement  Antimicrobials:  None  Subjective: No acute issues or events overnight, remains in good spirits denies headache fevers chills nausea vomiting diarrhea constipation  Objective: Vitals:   04/21/23 1721 04/21/23 1941 04/21/23 2311 04/22/23 0346  BP:  117/70 122/72 119/73  Pulse:  89 78 62 64  Resp: 19 16 17 15   Temp:  97.9 F (36.6 C) 98.1 F (36.7 C) 98.4 F (36.9  C)  TempSrc:  Oral Oral Oral  SpO2: 96% 95% 97% 91%  Weight:      Height:        Intake/Output Summary (Last 24 hours) at 04/22/2023 0731 Last data filed at 04/22/2023 0421 Gross per 24 hour  Intake 1257.34 ml  Output 550 ml  Net 707.34 ml    Filed Weights   04/14/23 1247 04/18/23 0940 04/21/23 0442  Weight: 92.5 kg 92.5 kg 75.8 kg    Examination:  General exam: Appears calm and comfortable  Respiratory system: Clear to auscultation. Respiratory effort normal. Cardiovascular system: S1 & S2 heard, RRR. No JVD, murmurs, rubs, gallops or clicks. No pedal edema. Gastrointestinal system: G-tube site clean/dry/intact Central nervous system: Alert and oriented. No focal neurological deficits. Extremities: Symmetric 5 x 5 power. Skin: No rashes, lesions or ulcers  Data Reviewed: I have personally reviewed following labs and imaging studies  CBC: Recent Labs  Lab 04/16/23 0605 04/17/23 0404 04/18/23 0449 04/19/23 0825  WBC 9.9 10.4 9.3 11.1*  HGB 15.3 15.4 16.4 16.9  HCT 45.6 46.0 49.4 49.5  MCV 87.5 86.3 87.9 87.0  PLT 153 149* 162 175   Basic Metabolic Panel: Recent Labs  Lab 04/15/23 1028 04/16/23 0101 04/16/23 0605 04/18/23 0449 04/19/23 0825 04/20/23 0315 04/21/23 1140 04/21/23 1835  NA  --  140 139 141 142 144  --   --   K  --  3.6 3.6 3.2* 3.4* 3.5  --   --   CL  --  107 106 108 111 108  --   --   CO2  --  23 23 22 23 23   --   --   GLUCOSE  --  82 78 83 101* 83  --   --   BUN  --  13 13 13 10 8   --   --   CREATININE  --  0.83 0.89 1.09 0.79 0.75  --   --   CALCIUM  --  8.5* 8.3* 8.4* 8.2* 9.1  --   --   MG 2.4 2.2  --  2.3  --   --  2.2 2.3  PHOS  --   --   --   --   --   --  2.1* 2.1*   GFR: Estimated Creatinine Clearance: 69.8 mL/min (by C-G formula based on SCr of 0.75 mg/dL). Liver Function Tests: Recent Labs  Lab 04/16/23 0605 04/18/23 0449 04/19/23 0825 04/20/23 0315  AST 50* 46* 40 37  ALT 58* 53* 46* 41  ALKPHOS 42 48 48 46   BILITOT 1.5* 1.6* 1.4* 1.2  PROT 4.7* 5.0* 5.3* 4.9*  ALBUMIN 2.5* 2.5* 2.6* 2.4*   No results for input(s): "LIPASE", "AMYLASE" in the last 168 hours.  Coagulation Profile: Recent Labs  Lab 04/20/23 0745  INR 1.4*   CBG: Recent Labs  Lab 04/21/23 1648 04/21/23 1939 04/21/23 2310 04/22/23 0345 04/22/23 0730  GLUCAP 178* 185* 189* 164* 182*    Recent Results (from the past 240 hour(s))  Surgical pcr screen     Status: None   Collection Time: 04/21/23  8:28 PM   Specimen: Nasal Mucosa; Nasal Swab  Result Value Ref Range Status   MRSA, PCR NEGATIVE NEGATIVE Final   Staphylococcus aureus NEGATIVE NEGATIVE Final    Comment: (NOTE) The Xpert SA Assay (FDA approved for  NASAL specimens in patients 27 years of age and older), is one component of a comprehensive surveillance program. It is not intended to diagnose infection nor to guide or monitor treatment. Performed at [facility], [address], [place], [place] [address]      Radiology Studies: IR GASTROSTOMY TUBE MOD SED  Result Date: 04/21/2023 INDICATION: Dysphagia.  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 EXAM: Procedures: 1. FLUOROSCOPIC-GUIDED NASOGASTRIC TUBE PLACEMENT 2. PERCUTANEOUS GASTROSTOMY TUBE PLACEMENT COMPARISON:  FL esophagram, 04/17/2023.  CT AP, 04/19/2023. MEDICATIONS: Vancomycin 1 gm IV; Antibiotics were administered within 1 hour of the procedure. CONTRAST:  15 mL of Isovue 300 administered into the gastric lumen. 0.5 mg glucagon IV ANESTHESIA/SEDATION: Moderate (conscious) sedation was employed during this procedure. A total of Versed 1 mg and Fentanyl 50 mcg was administered intravenously. Moderate Sedation Time: 13 minutes. The patient's level of consciousness and vital signs were monitored continuously by radiology nursing throughout the procedure under my direct supervision. FLUOROSCOPY TIME:  Fluoroscopic dose; 7 mGy COMPLICATIONS: None immediate. PROCEDURE: Informed  written consent was obtained from the patient and/or patient's representative following explanation of the procedure, risks, benefits and alternatives. A time out was performed prior to the initiation of the procedure. Maximal barrier sterile technique utilized including caps, mask, sterile gowns, sterile gloves, large sterile drape, hand hygiene and sterile prep. The LEFT upper quadrant was sterilely prepped and draped. A 5 Fr Kumpe as a oral gastric catheter was inserted into the stomach under fluoroscopy. The LEFT costal margin and barium opacified transverse colon were identified and avoided. Air was injected into the stomach for insufflation and visualization under fluoroscopy. Under sterile conditions and local anesthesia, 2T tacks were utilized to pexy the anterior aspect of the stomach against the ventral abdominal wall. Contrast injection confirmed appropriate positioning of each of the T tacks. An incision was made between the T tacks and a 17 gauge trocar needle was utilized to access the stomach. Needle position was confirmed within the stomach with aspiration of air and injection of a small amount of contrast. A guidewire was advanced into the gastric lumen and under intermittent fluoroscopic guidance, the access needle was exchanged for a telescoping peel-away sheath, ultimately allowing placement of a 18 Fr balloon retention gastrostomy tube. The retention balloon was insufflated with a mixture of dilute saline and contrast and pulled taut against the anterior wall of the stomach. The external disc was cinched. Contrast injection confirms positioning within the stomach. Several spot radiographic images were obtained in various obliquities for documentation. The patient tolerated procedure well without immediate post procedural complication. FINDINGS: After successful fluoroscopic guided placement, the gastrostomy tube is appropriately positioned with internal retention balloon against the ventral  aspect of the gastric lumen. IMPRESSION: Successful percutaneous insertion of an 18 Fr balloon retention gastrostomy tube. The gastrostomy may be used immediately for medication administration and in 4 hrs for the initiation of feeds. RECOMMENDATIONS: The patient will return to Vascular Interventional Radiology (VIR) for routine feeding tube evaluation and exchange in 6 months. Roanna Banning, MD [facility] Electronically Signed   By: Roanna Banning M.D.   On: 04/21/2023 09:05   IR Naso G Tube Plc W/FL W/Rad  Result Date: 04/21/2023 INDICATION: Dysphagia.  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 EXAM: Procedures: 1. FLUOROSCOPIC-GUIDED NASOGASTRIC TUBE PLACEMENT 2. PERCUTANEOUS GASTROSTOMY TUBE PLACEMENT COMPARISON:  FL esophagram, 04/17/2023.  CT AP, 04/19/2023. MEDICATIONS: Vancomycin 1 gm IV; Antibiotics were administered within 1 hour of the  procedure. CONTRAST:  15 mL of Isovue 300 administered into the gastric lumen. 0.5 mg glucagon IV ANESTHESIA/SEDATION: Moderate (conscious) sedation was employed during this procedure. A total of Versed 1 mg and Fentanyl 50 mcg was administered intravenously. Moderate Sedation Time: 13 minutes. The patient's level of consciousness and vital signs were monitored continuously by radiology nursing throughout the procedure under my direct supervision. FLUOROSCOPY TIME:  Fluoroscopic dose; 7 mGy COMPLICATIONS: None immediate. PROCEDURE: Informed written consent was obtained from the patient and/or patient's representative following explanation of the procedure, risks, benefits and alternatives. A time out was performed prior to the initiation of the procedure. Maximal barrier sterile technique utilized including caps, mask, sterile gowns, sterile gloves, large sterile drape, hand hygiene and sterile prep. The LEFT upper quadrant was sterilely prepped and draped. A 5 Fr Kumpe as a oral gastric catheter  was inserted into the stomach under fluoroscopy. The LEFT costal margin and barium opacified transverse colon were identified and avoided. Air was injected into the stomach for insufflation and visualization under fluoroscopy. Under sterile conditions and local anesthesia, 2T tacks were utilized to pexy the anterior aspect of the stomach against the ventral abdominal wall. Contrast injection confirmed appropriate positioning of each of the T tacks. An incision was made between the T tacks and a 17 gauge trocar needle was utilized to access the stomach. Needle position was confirmed within the stomach with aspiration of air and injection of a small amount of contrast. A guidewire was advanced into the gastric lumen and under intermittent fluoroscopic guidance, the access needle was exchanged for a telescoping peel-away sheath, ultimately allowing placement of a 18 Fr balloon retention gastrostomy tube. The retention balloon was insufflated with a mixture of dilute saline and contrast and pulled taut against the anterior wall of the stomach. The external disc was cinched. Contrast injection confirms positioning within the stomach. Several spot radiographic images were obtained in various obliquities for documentation. The patient tolerated procedure well without immediate post procedural complication. FINDINGS: After successful fluoroscopic guided placement, the gastrostomy tube is appropriately positioned with internal retention balloon against the ventral aspect of the gastric lumen. IMPRESSION: Successful percutaneous insertion of an 18 Fr balloon retention gastrostomy tube. The gastrostomy may be used immediately for medication administration and in 4 hrs for the initiation of feeds. RECOMMENDATIONS: The patient will return to Vascular Interventional Radiology (VIR) for routine feeding tube evaluation and exchange in 6 months. Roanna Banning, MD [facility]  Electronically Signed   By: Roanna Banning M.D.   On: 04/21/2023 09:05    Scheduled Meds:  apixaban  5 mg Per Tube BID   feeding supplement (PROSource TF20)  60 mL Per Tube Daily   free water  150 mL Per Tube Q4H   guaiFENesin  30 mL Per Tube BID   insulin aspart  0-15 Units Subcutaneous TID WC   pantoprazole (PROTONIX) IV  40 mg Intravenous QHS   potassium & sodium phosphates  1 packet Oral TID WC & HS   sodium chloride flush  3 mL Intravenous Q12H   sodium chloride flush  3 mL Intravenous Q12H   Continuous Infusions:  feeding supplement (OSMOLITE 1.5 CAL) 60 mL/hr at 04/22/23 0421     LOS: 8 days   Time spent:  Azucena Fallen, DO [facility]  If 7PM-7AM, please contact night-coverage [email or web address]  04/22/2023, 7:31 AM

## 2023-04-23 DIAGNOSIS — I441 Atrioventricular block, second degree: Secondary | ICD-10-CM | POA: Diagnosis not present

## 2023-04-23 LAB — GLUCOSE, CAPILLARY
Glucose-Capillary: 176 mg/dL — ABNORMAL HIGH (ref 70–99)
Glucose-Capillary: 138 mg/dL — ABNORMAL HIGH (ref 70–99)
Glucose-Capillary: 129 mg/dL — ABNORMAL HIGH (ref 70–99)
Glucose-Capillary: 188 mg/dL — ABNORMAL HIGH (ref 70–99)
Glucose-Capillary: 161 mg/dL — ABNORMAL HIGH (ref 70–99)

## 2023-04-23 LAB — MAGNESIUM
Magnesium: 2.2 mg/dL (ref 1.7–2.4)
Magnesium: 2.2 mg/dL (ref 1.7–2.4)

## 2023-04-23 LAB — PHOSPHORUS
Phosphorus: 2.1 mg/dL — ABNORMAL LOW (ref 2.5–4.6)
Phosphorus: 1.9 mg/dL — ABNORMAL LOW (ref 2.5–4.6)

## 2023-04-23 NOTE — Plan of Care (Signed)
  Problem: Education: Goal: Ability to describe self-care measures that may prevent or decrease complications (Diabetes Survival Skills Education) will improve Outcome: Progressing   Problem: Fluid Volume: Goal: Ability to maintain a balanced intake and output will improve Outcome: Progressing   Problem: Nutritional: Goal: Maintenance of adequate nutrition will improve Outcome: Progressing Goal: Progress toward achieving an optimal weight will improve Outcome: Progressing   Problem: Skin Integrity: Goal: Risk for impaired skin integrity will decrease Outcome: Progressing   Problem: Tissue Perfusion: Goal: Adequacy of tissue perfusion will improve Outcome: Progressing

## 2023-04-23 NOTE — Progress Notes (Signed)
PROGRESS NOTE    Samuel [name]  ZOX:[id number] DOB: October 05, 1945 DOA: 04/14/2023 PCP: [name], MD   Brief Narrative:  Patient 77 year old white male with insidious onset of dysphagia, initially solids transitioning to liquids approximately 2 weeks ago now unable to tolerate any p.o. without profound pleuritic chest pain.  Initial imaging remarkable for right-sided PE, EKG showing secondary AV block with symptomatic bradycardia ultimately transferred to [facility] after requiring atropine for evaluation by cardiology/EP for possible pacemaker evaluation.  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  Placement will be difficult if he is not cooperative  Assessment & Plan:   Principal Problem:   Acute pulmonary embolism (HCC) Active Problems:   Dysphagia   Dehydration   LFT elevation   Swelling of clavicular region   Pulmonary embolism (HCC)   Troponin I above reference range   Bradycardia   AV block, Mobitz II   Malignant neoplasm of lower third of esophagus (HCC)   Protein-calorie malnutrition, severe  Adenocarcinoma of the esophagus Concurrent profound dysphagia Severe protein-caloric malnutrition -Biopsy confirms intramucosal adenocarcinoma -Patient is not a candidate for esophageal stent or NG tube placement given stricture/anatomy  -G-tube placement today 10/17 per IR -CT abdomen without clear evidence for metastatic disease of the abdomen or pelvis, notable indeterminate focus of the L5 vertebral body but otherwise notable pancreatic and  renal cyst -Oncology following, plan for neoadjuvant chemo and radiation with ultimate goal of esophagectomy if possible -PET scan for staging after hospital discharge  At risk for refeeding syndrome  -Continue to monitor magnesium and phosphorus, supplementation ongoing  Acute PE with right heart strain -Transition to DOAC (Eliquis) -Presumed to be provoked secondary to questionable malignancy as above   Intermittent Mobitz II AV block , 2:1 which is responsive to ambulation -Asymptomatic, echo shows normal ventricular function  -EP consult confirms need for pacemaker placement - medically stable for procedure - per discussion with med/rads onc left sided placement should not interfere with future radiation therapy.  Mild hypokalemia, resolved Continue to follow   Prediabetes A1c 6.2 Metformin allegedly causes diarrhea Initiate tube feeds to avoid hypoglycemia once approved   Subclinical hyperthyroidism TSH depressed, T4 within normal limits Follow outpatient for repeat labs   Hyperbilirubinemia, elevated AST/ALT  Asymptomatic, ultrasound remarkable for sludge and questionable gallstones No indication for surgery given minimal to no symptoms Discontinue antibiotics  DVT prophylaxis: SCDs Start: 04/14/23 2012 apixaban (ELIQUIS) tablet 5 mg Code Status:   Code Status: Full Code Family Communication: At bedside  Status is: Inpatient  Dispo: The patient is from: Home              Anticipated d/c is to: TBD - patient requesting PT evaluation but has refused all activity the last 48h              Anticipated d/c date is: 48 to 72 hours              Patient currently not medically stable for discharge  Consultants:  GI, oncology, IR, GenSx, cardiology, EP  Procedures:  EGD, PEG placement, Pacemaker placement  Antimicrobials:  None  Subjective: No acute issues or events overnight, remains  in good spirits denies headache fevers chills nausea vomiting diarrhea  constipation  Objective: Vitals:   04/22/23 1613 04/22/23 2042 04/22/23 2349 04/23/23 0328  BP:  121/75 115/68 111/75  Pulse:  (!) 107 77 86  Resp: 20 20 20 16   Temp:  98.3 F (36.8 C) 98 F (36.7 C) 97.7 F (36.5 C)  TempSrc:  Oral Oral Oral  SpO2:  97% 96% 98%  Weight:      Height:        Intake/Output Summary (Last 24 hours) at 04/23/2023 0709 Last data filed at 04/23/2023 0458 Gross per 24 hour  Intake 1477 ml  Output 200 ml  Net 1277 ml    Filed Weights   04/18/23 0940 04/21/23 0442 04/22/23 0700  Weight: 92.5 kg 75.8 kg 78.9 kg    Examination:  General exam: Appears calm and comfortable  Respiratory system: Clear to auscultation. Respiratory effort normal. Cardiovascular system: S1 & S2 heard, RRR. No JVD, murmurs, rubs, gallops or clicks. No pedal edema. Gastrointestinal system: G-tube site clean/dry/intact Central nervous system: Alert and oriented. No focal neurological deficits. Extremities: Symmetric 5 x 5 power. Skin: No rashes, lesions or ulcers  Data Reviewed: I have personally reviewed following labs and imaging studies  CBC: Recent Labs  Lab 04/17/23 0404 04/18/23 0449 04/19/23 0825 04/22/23 0805  WBC 10.4 9.3 11.1* 12.3*  HGB 15.4 16.4 16.9 17.0  HCT 46.0 49.4 49.5 51.2  MCV 86.3 87.9 87.0 87.4  PLT 149* 162 175 164   Basic Metabolic Panel: Recent Labs  Lab 04/18/23 0449 04/19/23 0825 04/20/23 0315 04/21/23 1140 04/21/23 1835 04/22/23 0805 04/22/23 1808 04/23/23 0316  NA 141 142 144  --   --   --   --   --   K 3.2* 3.4* 3.5  --   --   --   --   --   CL 108 111 108  --   --   --   --   --   CO2 22 23 23   --   --   --   --   --   GLUCOSE 83 101* 83  --   --   --   --   --   BUN 13 10 8   --   --   --   --   --   CREATININE 1.09 0.79 0.75  --   --   --   --   --   CALCIUM 8.4* 8.2* 9.1  --   --   --   --   --   MG 2.3  --   --  2.2 2.3 2.2 2.2 2.2  PHOS  --   --   --  2.1* 2.1* 1.8* 1.9* 2.1*   GFR: Estimated Creatinine  Clearance: 76.3 mL/min (by C-G formula based on SCr of 0.75 mg/dL).  Liver Function Tests: Recent Labs  Lab 04/18/23 0449 04/19/23 0825 04/20/23 0315  AST 46* 40 37  ALT 53* 46* 41  ALKPHOS 48 48 46  BILITOT 1.6* 1.4* 1.2  PROT 5.0* 5.3* 4.9*  ALBUMIN 2.5* 2.6* 2.4*   Coagulation Profile: Recent Labs  Lab 04/20/23 0745  INR 1.4*   CBG: Recent Labs  Lab 04/22/23 1107 04/22/23 1530 04/22/23 2042 04/22/23 2346 04/23/23 0411  GLUCAP 186* 204* 198* 188* 176*    Recent Results (from the past 240 hour(s))  Surgical pcr screen     Status: None   Collection Time: 04/21/23  8:28 PM  Specimen: Nasal Mucosa; Nasal Swab  Result Value Ref Range Status   MRSA, PCR NEGATIVE NEGATIVE Final   Staphylococcus aureus NEGATIVE NEGATIVE Final    Comment: (NOTE) The Xpert SA Assay (FDA approved for NASAL specimens in patients 45 years of age and older), is one component of a comprehensive surveillance program. It is not intended to diagnose infection nor to guide or monitor treatment. Performed at [facility], [address], [place], [place] [address]      Radiology Studies: No results found.  Scheduled Meds:  apixaban  5 mg Per Tube BID   feeding supplement (PROSource TF20)  60 mL Per Tube Daily   free water  150 mL Per Tube Q4H   guaiFENesin  30 mL Per Tube BID   insulin aspart  0-15 Units Subcutaneous TID WC   pantoprazole (PROTONIX) IV  40 mg Intravenous QHS   potassium & sodium phosphates  1 packet Oral TID WC & HS   sodium chloride flush  3 mL Intravenous Q12H   sodium chloride flush  3 mL Intravenous Q12H   Continuous Infusions:  feeding supplement (OSMOLITE 1.5 CAL) 60 mL/hr at 04/23/23 0458     LOS: 9 days   Time spent:  Azucena Fallen, DO [facility]  If 7PM-7AM, please contact night-coverage [email or web address]  04/23/2023, 7:09 AM

## 2023-04-23 NOTE — Progress Notes (Signed)
Mobility Specialist Progress Note:   04/23/23 1347  Mobility  Activity Ambulated with assistance in hallway  Level of Assistance  (MinG)  Assistive Device [employer] Ambulated (ft) 270 ft  Activity Response Tolerated well  Mobility Referral Yes  $Mobility charge 1 Mobility  Mobility Specialist Start Time (ACUTE ONLY) 1330  Mobility Specialist Stop Time (ACUTE ONLY) 1342  Mobility Specialist Time Calculation (min) (ACUTE ONLY) 12 min   Pre Mobility: 106 HR , 94% SpO2 RA During Mobility: 116 HR ,  95% SpO2 Post Mobility: 107 HR , 95% SpO2  Pt received in chair, agreeable to mobility. SB to stand. MinG during ambulation. No complaints mentioned. Asymptomatic throughout. Pt returned to chair with call bell in reach and all needs met.  [name]  Mobility Specialist Please contact via [employer] office at [phone]

## 2023-04-24 ENCOUNTER — Encounter (HOSPITAL_COMMUNITY)
Admission: EM | Disposition: A | Discharge status: Home / Self Care | Payer: Self-pay | Source: Home / Self Care | Attending: Internal Medicine

## 2023-04-24 ENCOUNTER — Telehealth: Payer: Self-pay

## 2023-04-24 ENCOUNTER — Ambulatory Visit (HOSPITAL_COMMUNITY): Admission: RE | Admit: 2023-04-24 | Payer: 59 | Source: Home / Self Care | Admitting: Cardiovascular Disease

## 2023-04-24 DIAGNOSIS — I441 Atrioventricular block, second degree: Secondary | ICD-10-CM | POA: Diagnosis not present

## 2023-04-24 LAB — GLUCOSE, CAPILLARY
Glucose-Capillary: 109 mg/dL — ABNORMAL HIGH (ref 70–99)
Glucose-Capillary: 114 mg/dL — ABNORMAL HIGH (ref 70–99)
Glucose-Capillary: 137 mg/dL — ABNORMAL HIGH (ref 70–99)
Glucose-Capillary: 148 mg/dL — ABNORMAL HIGH (ref 70–99)
Glucose-Capillary: 156 mg/dL — ABNORMAL HIGH (ref 70–99)
Glucose-Capillary: 185 mg/dL — ABNORMAL HIGH (ref 70–99)

## 2023-04-24 LAB — PHOSPHORUS
Phosphorus: 2.3 mg/dL — ABNORMAL LOW (ref 2.5–4.6)
Phosphorus: 3.5 mg/dL (ref 2.5–4.6)

## 2023-04-24 LAB — MAGNESIUM
Magnesium: 2.2 mg/dL (ref 1.7–2.4)
Magnesium: 2.3 mg/dL (ref 1.7–2.4)

## 2023-04-24 SURGERY — PACEMAKER IMPLANT

## 2023-04-24 MED ORDER — FREE WATER
120.0000 mL | Freq: Every day | Status: DC
  Administered 2023-04-24 – 2023-04-25 (×7): 120 mL

## 2023-04-24 MED ORDER — POTASSIUM & SODIUM PHOSPHATES 280-160-250 MG PO PACK
1.0000 | PACK | Freq: Three times a day (TID) | ORAL | Status: DC
  Administered 2023-04-24 – 2023-04-25 (×3): 1
  Filled 2023-04-24 (×7): qty 1

## 2023-04-24 MED ORDER — OSMOLITE 1.5 CAL PO LIQD
237.0000 mL | Freq: Every day | ORAL | Status: DC
  Administered 2023-04-24: 120 mL
  Administered 2023-04-24 – 2023-04-25 (×6): 237 mL
  Filled 2023-04-24 (×10): qty 237

## 2023-04-24 MED ORDER — OSMOLITE 1.5 CAL PO LIQD
237.0000 mL | Freq: Every day | ORAL | Status: DC
  Filled 2023-04-24 (×4): qty 237

## 2023-04-24 NOTE — Discharge Instructions (Signed)
Information on my medicine - ELIQUIS (apixaban)  This medication education was reviewed with me or my healthcare representative as part of my discharge preparation.    Why was Eliquis prescribed for you? Eliquis was prescribed to treat blood clots that may have been found in the veins of your legs (deep vein thrombosis) or in your lungs (pulmonary embolism) and to reduce the risk of them occurring again.  What do You need to know about Eliquis ? Continue Eliquis 5 mg tablet taken TWICE daily.  Eliquis may be taken with or without food.   Try to take the dose about the same time in the morning and in the evening. If you have difficulty swallowing the tablet whole please discuss with your pharmacist how to take the medication safely.  Take Eliquis exactly as prescribed and DO NOT stop taking Eliquis without talking to the doctor who prescribed the medication.  Stopping may increase your risk of developing a new blood clot.  Refill your prescription before you run out.  After discharge, you should have regular check-up appointments with your healthcare provider that is prescribing your Eliquis.    What do you do if you miss a dose? If a dose of ELIQUIS is not taken at the scheduled time, take it as soon as possible on the same day and twice-daily administration should be resumed. The dose should not be doubled to make up for a missed dose.  Important Safety Information A possible side effect of Eliquis is bleeding. You should call your healthcare provider right away if you experience any of the following: Bleeding from an injury or your nose that does not stop. Unusual colored urine (red or dark brown) or unusual colored stools (red or black). Unusual bruising for unknown reasons. A serious fall or if you hit your head (even if there is no bleeding).  Some medicines may interact with Eliquis and might increase your risk of bleeding or clotting while on Eliquis. To help avoid this,  consult your healthcare provider or pharmacist prior to using any new prescription or non-prescription medications, including herbals, vitamins, non-steroidal anti-inflammatory drugs (NSAIDs) and supplements.  This website has more information on Eliquis (apixaban): http://[email or web address].com/eliquis/home     Follow with Primary MD Kirstie Peri, MD in 7 days   Get CBC, CMP, anemia panel, TSH, 2 view Chest X ray -  checked next visit with your primary MD    Activity: As tolerated with Full fall precautions use walker/cane & assistance as needed  Disposition Home    Diet: Heart Healthy    Special Instructions: If you have smoked or chewed Tobacco  in the last 2 yrs please stop smoking, stop any regular Alcohol  and or any Recreational drug use.  On your next visit with your primary care physician please Get Medicines reviewed and adjusted.  Please request your Prim.MD to go over all Hospital Tests and Procedure/Radiological results at the follow up, please get all Hospital records sent to your Prim MD by signing hospital release before you go home.  If you experience worsening of your admission symptoms, develop shortness of breath, life threatening emergency, suicidal or homicidal thoughts you must seek medical attention immediately by calling 911 or calling your MD immediately  if symptoms less severe.  You Must read complete instructions/literature along with all the possible adverse reactions/side effects for all the Medicines you take and that have been prescribed to you. Take any new Medicines after you have completely understood and accpet all the

## 2023-04-24 NOTE — TOC Progression Note (Signed)
Transition of Care (TOC) - Progression Note  Donn Pierini RN, BSN Transitions of Care Unit 4E- RN Case Manager See Treatment Team for direct phone #   Patient Details  Name: Samuel [name] MRN: [id number] Date of Birth: 1946/06/06  Transition of Care [facility]) CM/SW Contact  Zenda Alpers, Lenn Sink, RN Phone Number: 04/24/2023, 3:11 PM  Clinical Narrative:    Cm spoke with pt and his friends at bedside,  Marisue Ivan [phone] Trula Ore- [phone] To discuss transition needs and Tube feed needs.  Pt requesting RW and home suction for oral suction needs in addition to home TF needs.  Per conversation at bedside- pt lives at home with spouse however spouse can only offer limited assist. 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 These individuals need assist and [employer] has aids that come out to assist with ADLs, however pt will not be able to assist and neighbors are concerned as to how everything is going to go moving forward in the home. Discussed pt submitting application for his own PCS services- Trula Ore to follow up on this with his PCP, pt will also need assist with transportation to [facility] appointments- Trula Ore and Marisue Ivan are not sure if he is signed up with Medicaid transportation and will f/u on this as well.   Cm has reached out to [facility] with [facility] for home TF needs- Elita Quick is to reach out to [place] and patient today for bedside education and coordination of home TF needs.   Call made to [employer] for home suction and RW needs- suction will be delivered to the home, and RW will plan to be delivered to the bedside here prior to discharge.   Per pt they are working on getting a hospital moved down from his upstairs to lower level of his home, and have also ordered an ice maker off of [employer].   1200- Pam has coordinated to meet with pt and Christina around 1pm this afternoon.   TOC to continue to follow for coordination of  home needs. DME orders have been placed    Expected Discharge Plan: Home w Home Health Services Barriers to Discharge: Continued Medical Work up  Expected Discharge Plan and Services   Discharge Planning Services: CM Consult Post Acute Care Choice: Home Health, Durable Medical Equipment Living arrangements for the past 2 months: Single Family Home                 DME Arranged: Tube feeding DME Agency: Other - Comment Julianne Rice) Date DME Agency Contacted: 04/21/23   Representative spoke with at DME Agency: Pam             Social Determinants of Health (SDOH) Interventions SDOH Screenings   Food Insecurity: No Food Insecurity (04/15/2023)  Housing: Low Risk  (04/15/2023)  Transportation Needs: No Transportation Needs (04/15/2023)  Utilities: Not At Risk (04/15/2023)  Alcohol Screen: Low Risk  (08/19/2022)  Depression (PHQ2-9): Low Risk  (12/15/2022)  Financial Resource Strain: Low Risk  (08/19/2022)  Physical Activity: Sufficiently Active (08/19/2022)  Social Connections: Moderately Integrated (08/19/2022)  Stress: No Stress Concern Present (08/19/2022)  Tobacco Use: Medium Risk (04/15/2023)    Readmission Risk Interventions     No data to display

## 2023-04-24 NOTE — Progress Notes (Signed)
Physical Therapy Treatment Patient Details Name: Samuel [name] MRN: [id number] DOB: [birth year] Today's Date: 04/24/2023   History of Present Illness Patient is a 77 y/o male admitted 04/14/23 with SOB and dysphagia.  Found to have PE, asymptomatic bradycardia/second degree heart block considering PPM, and esophageal stenosis with plan for EGD.  PMH positive for DM, B knee arthroplasty.    PT Comments  Pt received sitting in the recliner and agreeable to session. Pt demonstrates slight instability with static and dynamic standing balance with cane support requiring intermittent CGA, but no LOB. Pt able to tolerate increased gait distance this session. Pt requests to use the bathroom at the end of the session and is instructed to pull the call cord when finished with pt verbalizing agreement and RN notified. Pt continues to benefit from PT services to progress toward functional mobility goals.    If plan is discharge home, recommend the following: A little help with walking and/or transfers;Help with stairs or ramp for entrance;Assistance with cooking/housework;Assist for transportation   Can travel by private vehicle        Equipment Recommendations  None recommended by PT    Recommendations for Other Services       Precautions / Restrictions Precautions Precautions: Fall Restrictions Weight Bearing Restrictions: No     Mobility  Bed Mobility               General bed mobility comments: Pt in recliner upon arrival    Transfers Overall transfer level: Needs assistance Equipment used: Straight cane Transfers: Sit to/from Stand Sit to Stand: Supervision           General transfer comment: from recliner with increased time for power up. Slight instability in standing, but no LOB    Ambulation/Gait Ambulation/Gait assistance: Contact guard assist, Supervision Gait Distance (Feet): 220 Feet Assistive device: Straight cane Gait Pattern/deviations: Step-through pattern,  Decreased stride length, Trunk flexed       General Gait Details: slow step-through pattern with slight instability, but no LOB. Cues for upright posture       Balance Overall balance assessment: Needs assistance Sitting-balance support: No upper extremity supported, Feet supported Sitting balance-Leahy Scale: Good Sitting balance - Comments: sitting EOB   Standing balance support: Single extremity supported, During functional activity Standing balance-Leahy Scale: Fair Standing balance comment: with cane support                            Cognition Arousal: Alert Behavior During Therapy: WFL for tasks assessed/performed Overall Cognitive Status: Within Functional Limits for tasks assessed                                          Exercises      General Comments        Pertinent Vitals/Pain Pain Assessment Pain Assessment: No/denies pain     PT Goals (current goals can now be found in the care plan section) Acute Rehab PT Goals Patient Stated Goal: Return home and back to work PT Goal Formulation: With patient Time For Goal Achievement: 05/01/23 Progress towards PT goals: Progressing toward goals    Frequency    Min 1X/week       AM-PAC PT "6 Clicks" Mobility   Outcome Measure  Help needed turning from your back to your side while in a flat bed without using bedrails?:  A Little Help needed moving from lying on your back to sitting on the side of a flat bed without using bedrails?: A Little Help needed moving to and from a bed to a chair (including a wheelchair)?: A Little Help needed standing up from a chair using your arms (e.g., wheelchair or bedside chair)?: A Little Help needed to walk in hospital room?: A Little Help needed climbing 3-5 steps with a railing? : A Little 6 Click Score: 18    End of Session Equipment Utilized During Treatment: Gait belt Activity Tolerance: Patient tolerated treatment well Patient left:  Other (comment) (In bathroom with instructions to pull cord when finished, RN notified) Nurse Communication: Mobility status PT Visit Diagnosis: Other abnormalities of gait and mobility (R26.89);Muscle weakness (generalized) (M62.81)     Time: 1610-9604 PT Time Calculation (min) (ACUTE ONLY): 19 min  Charges:    $Gait Training: 8-22 mins PT General Charges $$ ACUTE PT VISIT: 1 Visit                     Johny Shock, PTA Acute Rehabilitation Services Secure Chat Preferred  Office:([phone]    Johny Shock 04/24/2023, 3:51 PM

## 2023-04-24 NOTE — Progress Notes (Addendum)
Patient Name: Samuel [name] Date of Encounter: 04/24/2023  Primary Cardiologist: [name], MD Electrophysiologist: None  Interval Summary   Family/friend is available at time of round and has many questions.  No new concerns from patient at this time.   Vital Signs    Vitals:   04/23/23 1800 04/23/23 1953 04/24/23 0415 04/24/23 0458  BP:  105/77  102/76  Pulse: 95 77  69  Resp: 14 15  15   Temp:  98.6 F (37 C)  97.7 F (36.5 C)  TempSrc:  Oral  Oral  SpO2: 95% 93%  94%  Weight:   79.4 kg   Height:        Intake/Output Summary (Last 24 hours) at 04/24/2023 0854 Last data filed at 04/23/2023 1700 Gross per 24 hour  Intake 2340 ml  Output --  Net 2340 ml   Filed Weights   04/21/23 0442 04/22/23 0700 04/24/23 0415  Weight: 75.8 kg 78.9 kg 79.4 kg    Physical Exam    GEN- The patient is well appearing, alert and oriented x 3 today.   Lungs- Clear to ausculation bilaterally, normal work of breathing Cardiac- Regular rate and rhythm, no murmurs, rubs or gallops GI- soft, NT, ND, + BS Extremities- no clubbing or cyanosis. No edema  Telemetry    NSR 70-90s, no further sustained bradycardia over the weekend.  (personally reviewed)  Hospital Course    Mitch Shillington is a 77 y.o. male with a past medical history of diabetes who presented to the ED on 10/11 with a chief complaint of dysphagia. He was found to have primarily intermittent 2:1 AV block while admitted. With at least one episode felt to be true Mobitz II AV block.   He has reverted to AF now, and is no longer having bradycardia. He has a newly diagnosed gastric malignancy. ? if there is some compression/interaction with his vagus nerve, causing his intermittent AVN block. There is some concern that without a pacemaker he will be unable to receive treatment in the outpatient setting.    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  Placement will be difficult if he is not cooperative  No sustained bradycardia over weekend 10/19 - 10/20.  Assessment & Plan    Intermittent AV block 2:1 conduction He has not had any further bradycardia over the weekend With uninterrupted OAC and PEG tube, his risk of bleeding and or infection are significant.  Will discuss case further with MD. Leave NPO for now.   Dysphagia - Likely malignant PEG tube Esophageal stenosis. ? R sternoclavicular swelling ? Sclerotic lesion T2 noted on Ct May require chemo/radiation/surgery Pending evaluation for potential METs. A large concern is his ability to tolerate treatment without pacing  Acute PEs / DVTs Continue Eliquis.  Unable to interrupt, so greatly increased risk of hematoma -> infection Not sure transition to coumadin would be reasonable in this pt who will have    For questions or updates, please contact [facility] Please consult [email or web address] for contact  info under Cardiology/STEMI.  Signed, Graciella Freer, PA-C  04/24/2023, 8:54 AM   EP Attending  Patient seen and examined. Agree with above. The patient has not had any additional bradycardia. I strongly suspect that the brady was related to his choking and inability to swallow. Presently no indication for PPM. Avoid AV nodal blocking drugs, electrolyte abnormality, and choking/gaging when swallowing. He can proceed with chemo and XRT as outlined by onc and  Rad Onc.   Sharlot Gowda Reason Helzer,MD

## 2023-04-24 NOTE — Evaluation (Signed)
Occupational Therapy Evaluation Patient Details Name: Samuel [name] MRN: [id number] DOB: 1946/06/21 Today's Date: 04/24/2023   History of Present Illness Patient is a 77 y/o male admitted 04/14/23 with SOB and dysphagia.  Found to have PE, asymptomatic bradycardia/second degree heart block considering PPM, and esophageal stenosis with plan for EGD.  PMH positive for DM, B knee arthroplasty.   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 Upon eval, pt presents with decreased balance, activity tolerance, and strength. Pt cognition seems WFL, but will continue to assess executive function as he performs medication management for himself and the two women he provides care for. Will continue to follow acutely but do not suspect need for follow up therapy after discharge.       If plan is discharge home, recommend the following: A little help with walking and/or transfers;A little help with bathing/dressing/bathroom;Assistance with cooking/housework;Help with stairs or ramp for entrance;Assist for transportation    Functional Status Assessment  Patient has had a recent decline in their functional status and demonstrates the ability to make significant improvements in function in a reasonable and predictable amount of time.  Equipment Recommendations  None recommended by OT    Recommendations for Other Services       Precautions / Restrictions Precautions Precautions: Fall Restrictions Weight Bearing Restrictions: No      Mobility Bed Mobility Overal bed mobility: Needs Assistance Bed Mobility: Supine to Sit, Sit to Supine     Supine to sit: Supervision, HOB elevated, Used rails Sit to supine: Supervision, Used rails   General bed mobility comments: increased time and cues for technique    Transfers Overall transfer level: Needs assistance Equipment used: Straight cane Transfers: Sit to/from Stand Sit to Stand: Contact guard  assist           General transfer comment: From EOB with CGA for safety      Balance Overall balance assessment: Needs assistance   Sitting balance-Leahy Scale: Good Sitting balance - Comments: sitting EOB   Standing balance support: Single extremity supported Standing balance-Leahy Scale: Fair Standing balance comment: with cane support                           ADL either performed or assessed with clinical judgement   ADL Overall ADL's : Needs assistance/impaired Eating/Feeding: NPO   Grooming: Standing;Supervision/safety   Upper Body Bathing: Modified independent;Sitting   Lower Body Bathing: Sit to/from stand;Supervison/ safety   Upper Body Dressing : Modified independent;Sitting   Lower Body Dressing: Supervision/safety;Sit to/from stand   Toilet Transfer: Contact guard assist;Ambulation;Regular Toilet;Grab bars;Rolling walker (2 wheels)           Functional mobility during ADLs: Contact guard assist;Cane       Vision Baseline Vision/History: 1 Wears glasses Ability to See in Adequate Light: 0 Adequate Patient Visual Report: No change from baseline Vision Assessment?: No apparent visual deficits Additional Comments: wears glasses at all times except for reading     Perception         Praxis         Pertinent Vitals/Pain Pain Assessment Pain Assessment: Faces Pain Intervention(s): Monitored during session     Extremity/Trunk Assessment Upper Extremity Assessment Upper Extremity Assessment: Generalized weakness   Lower Extremity Assessment Lower Extremity Assessment: Generalized weakness       Communication Communication Communication: No apparent difficulties   Cognition Arousal: Alert Behavior During  Therapy: WFL for tasks assessed/performed Overall Cognitive Status: Within Functional Limits for tasks assessed                                 General Comments: Pt has been managing his roles at home from  here in relation to provision of care for his typical caregiving roles and has been making sure they are cared for. very aware of what plans are here acutely and is serving well as a self advocate     General Comments  HR 93-124 with mobility.    Exercises     Shoulder Instructions      Home Living Family/patient expects to be discharged to:: Private residence Living Arrangements: Non-relatives/Friends;Spouse/significant other Available Help at Discharge: Family Type of Home: House Home Access: Stairs to enter Secretary/administrator of Steps: 5 Entrance Stairs-Rails: Right;Left Home Layout: Two level Alternate Level Stairs-Number of Steps: flight Alternate Level Stairs-Rails: Left Bathroom Shower/Tub: Chief Strategy Officer: Standard     Home Equipment: Tub bench;Grab bars - tub/shower;Hand held shower head;Cane - single point;Rolling Walker (2 wheels)          Prior Functioning/Environment Prior Level of Function : Independent/Modified Independent             Mobility Comments: 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  He helps with their daily ADL's and chores, though they have other help from [employer] and go to programs most days.  Patient's wife here from [facility] as well though they sleep separate.  Patient was sleeping upstairs, but plans for his wife to move up there and he can stay on the main level ADLs Comments: Indep in ADL and IADL        OT Problem List: Decreased strength;Decreased activity tolerance;Impaired balance (sitting and/or standing);Decreased knowledge of use of DME or AE      OT Treatment/Interventions: Self-care/ADL training;Therapeutic exercise;DME and/or AE instruction;Balance training;Patient/family education;Therapeutic activities    OT Goals(Current goals can be found in the care plan section) Acute Rehab OT Goals Patient Stated Goal: none  stated OT Goal Formulation: With patient Time For Goal Achievement: 05/08/23 Potential to Achieve Goals: Good  OT Frequency: Min 1X/week    Co-evaluation              AM-PAC OT "6 Clicks" Daily Activity     Outcome Measure Help from another person eating meals?: Total (new Gtube) Help from another person taking care of personal grooming?: A Little Help from another person toileting, which includes using toliet, bedpan, or urinal?: A Little Help from another person bathing (including washing, rinsing, drying)?: A Little Help from another person to put on and taking off regular upper body clothing?: None Help from another person to put on and taking off regular lower body clothing?: A Little 6 Click Score: 17   End of Session Equipment Utilized During Treatment: Gait belt;Rolling walker (2 wheels) (cane) Nurse Communication: Mobility status  Activity Tolerance: Patient tolerated treatment well Patient left: in chair;with call bell/phone within reach;with family/visitor present  OT Visit Diagnosis: Unsteadiness on feet (R26.81);Muscle weakness (generalized) (M62.81)                Time: 6010-9323 OT Time Calculation (min): 43 min Charges:  OT General Charges $OT Visit: 1 Visit OT Evaluation $OT Eval Low Complexity: 1 Low OT Treatments $Self  Care/Home Management : 8-22 mins $Therapeutic Activity: 8-22 mins  Tyler Deis, OTR/L [facility] Acute Rehabilitation Office: [phone]   Myrla Halsted 04/24/2023, 8:55 AM

## 2023-04-24 NOTE — Progress Notes (Signed)
Nutrition Follow-up  DOCUMENTATION CODES:   Severe malnutrition in context of chronic illness  INTERVENTION:   Transition to bolus TF regimen: 1 carton ( ) Osmolite 1.5-- 6 times daily Start with 1/2 carton ( ) for first bolus, if tolerates well can increased to 1 carton ( ) for each following scheduled bolus 60ml water flush before and after each bolus ( total) Provides 2130 kcal, 90g protein and free water   NUTRITION DIAGNOSIS:   Severe Malnutrition related to chronic illness, cancer and cancer related treatments as evidenced by percent weight loss, energy intake < or equal to 75% for > or equal to 1 month, mild fat depletion, severe muscle depletion, mild muscle depletion. - remains applicable  GOAL:   Patient will meet greater than or equal to 90% of their needs - goal met via TF  MONITOR:   Labs, Weight trends, TF tolerance  REASON FOR ASSESSMENT:   Consult Enteral/tube feeding initiation and management (new G-tube)  ASSESSMENT:   Pt admitted with worsening symptoms of dysphagia, initially with solids transitioning to liquids about 2 weeks PTA progressing to inability to tolerate any PO without pleuritic chest pain.  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 He reports feeling better now that he is receiving nutrition via tube. He denies n/v/d and reports having about 2 BM's daily. Recalls that he was having an episode of gas with [employer] but states this only occurred once.   TF's were held this morning d/t tentative PPM however his heart has remained without bradycardia over the weekend and PPM placement has been cancelled for today with possible plans for discharge.   RN present at bedside. Discussed transitioning to bolus TF's. Case manager updated on home recs.  Pt and friends concerned with coordination of home care. Informed them they would be trained and provided with all information necessary prior to discharge.   Medications: SSI 0-15 units TID, protonix, phos-nak   Labs: phos 1.9, CBG's 114-188 x24 hours  Diet Order:   Diet Order             Diet NPO time specified  Diet effective midnight                   EDUCATION NEEDS:   Education needs have been addressed  Skin:  Skin Assessment: Reviewed RN Assessment  Last BM:  10/19  Height:   Ht Readings from Last 1 Encounters:  04/18/23 5\' 6"  (1.676 m)    Weight:   Wt Readings from Last 1 Encounters:  04/24/23 79.4 kg   BMI:  Body mass index is 28.25 kg/m.  Estimated Nutritional Needs:   Kcal:  2100-2300  Protein:  110-125g  Fluid:  >/=2L  Drusilla Kanner, RDN, LDN Clinical Nutrition

## 2023-04-24 NOTE — Telephone Encounter (Signed)
Copied from CRM [id number]. Topic: General - Other >> Apr 24, 2023 11:47 AM Macon Large wrote: Reason for CRM: Pt friend Jeanett Schlein stated pt is in the hospital and will be discharged soon. Elizabeth requests PCS assessment [phone] form). Cb# [phone]

## 2023-04-24 NOTE — Progress Notes (Signed)
PROGRESS NOTE    Samuel [name]  WUJ:[id number] DOB: 10-Oct-1945 DOA: 04/14/2023 PCP: [name], MD   Brief Narrative:  Patient 77 year old white male with insidious onset of dysphagia, initially solids transitioning to liquids approximately 2 weeks ago now unable to tolerate any p.o. without profound pleuritic chest pain.  Initial imaging remarkable for right-sided PE, EKG showing secondary AV block with symptomatic bradycardia ultimately transferred to [facility] after requiring atropine for evaluation by cardiology/EP for possible pacemaker evaluation.  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  Placement will be difficult if he is not cooperative 10/21 currently no indication for pacemaker, prior symptomatic bradycardia appears to have improved with supportive care.  Outpatient follow-up with EP/cardiology as appropriate.  Patient otherwise stable and agreeable for discharge home, awaiting for safe disposition and equipment delivery prior to discharge per patient and family's wishes.  Assessment & Plan:   Principal Problem:   Acute pulmonary embolism (HCC) Active Problems:   Dysphagia   Dehydration   LFT elevation   Swelling of clavicular region   Pulmonary embolism (HCC)   Troponin I above reference range   Bradycardia   AV block, Mobitz 2   Malignant neoplasm of lower third of esophagus (HCC)   Protein-calorie malnutrition, severe  Adenocarcinoma of the esophagus Concurrent profound dysphagia Severe protein-caloric malnutrition -Biopsy  confirms intramucosal adenocarcinoma -Patient is not a candidate for esophageal stent or NG tube placement given stricture/anatomy  -G-tube placement today 10/17 per IR -CT abdomen without clear evidence for metastatic disease of the abdomen or pelvis, notable indeterminate focus of the L5 vertebral body but otherwise notable pancreatic and renal cyst -Oncology following, plan for neoadjuvant chemo and radiation with ultimate goal of esophagectomy if possible -PET scan for staging after hospital discharge  At risk for refeeding syndrome  -Continue to monitor magnesium and phosphorus, supplementation ongoing  Acute PE with right heart strain -Transition to DOAC (Eliquis) -Presumed to be provoked secondary to questionable malignancy as above   Intermittent Mobitz II AV block , 2:1 which is responsive to ambulation -Asymptomatic, echo shows normal ventricular function  -EP consult following, given resolution of bradycardia over the weekend no current indication for for urgent pacemaker placement, okay to follow-up outpatient with EP to father for future needs.   Mild hypokalemia, resolved Continue to follow   Prediabetes A1c 6.2 Metformin allegedly causes diarrhea Initiate tube feeds to avoid hypoglycemia -appreciate dietary recommendations   Subclinical hyperthyroidism TSH depressed, T4 within normal limits Follow outpatient for repeat labs   Hyperbilirubinemia, elevated AST/ALT  Asymptomatic, ultrasound remarkable for sludge and questionable gallstones No indication for surgery given minimal to no symptoms Discontinue antibiotics  DVT prophylaxis: SCDs Start: 04/14/23 2012 apixaban (ELIQUIS) tablet 5 mg Code Status:   Code Status: Full Code Family Communication: At bedside  Status is: Inpatient  Dispo: The patient is from: Home              Anticipated d/c is to: Home with home health PT              Anticipated d/c date is: Imminent pending safe disposition  Patient currently is medically stable for discharge  Consultants:  GI, oncology, IR, GenSx, cardiology, EP  Procedures:  EGD, PEG placement, Pacemaker placement  Antimicrobials:  None  Subjective: No acute issues or events overnight, remains in good spirits denies headache fevers chills nausea vomiting diarrhea constipation  Objective: Vitals:   04/23/23 1800 04/23/23 1953 04/24/23 0415 04/24/23 0458  BP:  105/77  102/76  Pulse: 95 77  69  Resp: 14 15  15   Temp:  98.6 F (37 C)  97.7 F (36.5 C)  TempSrc:  Oral  Oral  SpO2: 95% 93%  94%  Weight:   79.4 kg   Height:        Intake/Output Summary (Last 24 hours) at 04/24/2023 0728 Last data filed at 04/23/2023 1700 Gross per 24 hour  Intake 2522 ml  Output --  Net 2522 ml    Filed Weights   04/21/23 0442 04/22/23 0700 04/24/23 0415  Weight: 75.8 kg 78.9 kg 79.4 kg    Examination:  General exam: Appears calm and comfortable  Respiratory system: Clear to auscultation. Respiratory effort normal. Cardiovascular system: S1 & S2 heard, RRR. No JVD, murmurs, rubs, gallops or clicks. No pedal edema. Gastrointestinal system: G-tube site clean/dry/intact Central nervous system: Alert and oriented. No focal neurological deficits. Extremities: Symmetric 5 x 5 power. Skin: No rashes, lesions or ulcers  Data Reviewed: I have personally reviewed following labs and imaging studies  CBC: Recent Labs  Lab 04/18/23 0449 04/19/23 0825 04/22/23 0805  WBC 9.3 11.1* 12.3*  HGB 16.4 16.9 17.0  HCT 49.4 49.5 51.2  MCV 87.9 87.0 87.4  PLT 162 175 164   Basic Metabolic Panel: Recent Labs  Lab 04/18/23 0449 04/19/23 0825 04/20/23 0315 04/21/23 1140 04/21/23 1835 04/22/23 0805 04/22/23 1808 04/23/23 0316 04/23/23 1758  NA 141 142 144  --   --   --   --   --   --   K 3.2* 3.4* 3.5  --   --   --   --   --   --   CL 108 111 108  --   --   --   --   --   --   CO2 22 23 23   --   --   --   --   --   --   GLUCOSE 83 101* 83   --   --   --   --   --   --   BUN 13 10 8   --   --   --   --   --   --   CREATININE 1.09 0.79 0.75  --   --   --   --   --   --   CALCIUM 8.4* 8.2* 9.1  --   --   --   --   --   --   MG 2.3  --   --    < > 2.3 2.2 2.2 2.2 2.2  PHOS  --   --   --    < > 2.1* 1.8* 1.9* 2.1* 1.9*   < > = values in this interval not displayed.   GFR: Estimated Creatinine Clearance: 76.6 mL/min (by C-G formula based on SCr of 0.75 mg/dL).  Liver Function Tests: Recent Labs  Lab 04/18/23 0449 04/19/23 0825 04/20/23 0315  AST 46* 40 37  ALT 53* 46* 41  ALKPHOS 48 48 46  BILITOT 1.6* 1.4* 1.2  PROT 5.0* 5.3* 4.9*  ALBUMIN 2.5* 2.6* 2.4*   Coagulation Profile: Recent Labs  Lab 04/20/23 0745  INR 1.4*   CBG: Recent Labs  Lab 04/23/23 1146 04/23/23 1648 04/23/23 2042 04/24/23 0034 04/24/23 0414  GLUCAP 129* 188* 161* 156* 114*    Recent Results (from the past 240 hour(s))  Surgical pcr screen     Status: None   Collection Time: 04/21/23  8:28 PM   Specimen: Nasal Mucosa; Nasal Swab  Result Value Ref Range Status   MRSA, PCR NEGATIVE NEGATIVE Final   Staphylococcus aureus NEGATIVE NEGATIVE Final    Comment: (NOTE) The Xpert SA Assay (FDA approved for NASAL specimens in patients 75 years of age and older), is one component of a comprehensive surveillance program. It is not intended to diagnose infection nor to guide or monitor treatment. Performed at [facility], [address] [place], [place] [address]      Radiology Studies: No results found.  Scheduled Meds:  apixaban  5 mg Per Tube BID   feeding supplement (PROSource TF20)  60 mL Per Tube Daily   free water  150 mL Per Tube Q4H   guaiFENesin  30 mL Per Tube BID   insulin aspart  0-15 Units Subcutaneous TID WC   pantoprazole (PROTONIX) IV  40 mg Intravenous QHS   potassium & sodium phosphates  1 packet Oral TID WC & HS   sodium chloride flush  3 mL Intravenous Q12H   sodium chloride flush  3 mL Intravenous Q12H    Continuous Infusions:  feeding supplement (OSMOLITE 1.5 CAL) Stopped (04/24/23 0000)     LOS: 10 days   Time spent:  Azucena Fallen, DO [facility]  If 7PM-7AM, please contact night-coverage [email or web address]  04/24/2023, 7:29 AM

## 2023-04-25 DIAGNOSIS — C155 Malignant neoplasm of lower third of esophagus: Secondary | ICD-10-CM | POA: Diagnosis not present

## 2023-04-25 DIAGNOSIS — I441 Atrioventricular block, second degree: Secondary | ICD-10-CM | POA: Diagnosis not present

## 2023-04-25 DIAGNOSIS — R001 Bradycardia, unspecified: Secondary | ICD-10-CM | POA: Diagnosis not present

## 2023-04-25 DIAGNOSIS — R1310 Dysphagia, unspecified: Secondary | ICD-10-CM | POA: Diagnosis not present

## 2023-04-25 DIAGNOSIS — R1319 Other dysphagia: Secondary | ICD-10-CM | POA: Diagnosis not present

## 2023-04-25 DIAGNOSIS — I2699 Other pulmonary embolism without acute cor pulmonale: Secondary | ICD-10-CM | POA: Diagnosis not present

## 2023-04-25 LAB — CBC
HCT: 46.9 % (ref 39.0–52.0)
Hemoglobin: 15.5 g/dL (ref 13.0–17.0)
MCH: 29.2 pg (ref 26.0–34.0)
MCHC: 33 g/dL (ref 30.0–36.0)
MCV: 88.3 fL (ref 80.0–100.0)
Platelets: 206 10*3/uL (ref 150–400)
RBC: 5.31 MIL/uL (ref 4.22–5.81)
RDW: 15.5 % (ref 11.5–15.5)
WBC: 10.3 10*3/uL (ref 4.0–10.5)
nRBC: 0 % (ref 0.0–0.2)

## 2023-04-25 LAB — GLUCOSE, CAPILLARY
Glucose-Capillary: 94 mg/dL (ref 70–99)
Glucose-Capillary: 208 mg/dL — ABNORMAL HIGH (ref 70–99)
Glucose-Capillary: 115 mg/dL — ABNORMAL HIGH (ref 70–99)
Glucose-Capillary: 178 mg/dL — ABNORMAL HIGH (ref 70–99)

## 2023-04-25 MED ORDER — FREE WATER: 120.0000 mL | Freq: Every day | 0 refills | Status: DC

## 2023-04-25 MED ORDER — POTASSIUM & SODIUM PHOSPHATES 280-160-250 MG PO PACK: 1.0000 | PACK | Freq: Three times a day (TID) | ORAL | 0 refills | Status: AC

## 2023-04-25 MED ORDER — APIXABAN 5 MG PO TABS: 5.0000 mg | ORAL_TABLET | Freq: Two times a day (BID) | ORAL | 0 refills | Status: DC

## 2023-04-25 MED ORDER — OSMOLITE 1.5 CAL PO LIQD: 237.0000 mL | Freq: Every day | ORAL | 0 refills | Status: AC

## 2023-04-25 NOTE — Progress Notes (Signed)
Mobility Specialist Progress Note;   04/25/23 1150  Mobility  Activity Ambulated with assistance in hallway  Level of Assistance Contact guard assist, steadying assist  Assistive Device Cane  Distance Ambulated (ft) 470 ft  Activity Response Tolerated well  Mobility Referral Yes  $Mobility charge 1 Mobility  Mobility Specialist Start Time (ACUTE ONLY) 1150  Mobility Specialist Stop Time (ACUTE ONLY) 1210  Mobility Specialist Time Calculation (min) (ACUTE ONLY) 20 min   Pt agreeable to mobility. Required minG assistance during ambulation. Asx throughout session. Pt back in bed with all needs met.   [name] Mobility Specialist Please contact via SecureChat or Rehab Office [phone]

## 2023-04-25 NOTE — Telephone Encounter (Signed)
Pt is needing PCS service.

## 2023-04-25 NOTE — Discharge Summary (Signed)
Systemic Diam: 2.50 cm [name] MD Electronically signed by [name] MD Signature Date/Time: 04/15/2023/3:27:38 PM    Final    VAS [place] LOWER EXTREMITY VENOUS (DVT)  Result Date: 04/15/2023  Lower Venous DVT Study Patient Name:  Samuel [name]  Date of Exam:   04/15/2023 Medical Rec #: [id number]    Accession #:    [id number] Date of Birth: 08-Oct-1945    Patient  Gender: M Patient Age:   77 years Exam Location:  [facility] Procedure:      VAS [place] LOWER EXTREMITY VENOUS (DVT) Referring Phys: [facility] GOEL --------------------------------------------------------------------------------  Indications: Pulmonary embolism.  Comparison Study: No previous exams Performing Technologist: Jody Hill RVT, RDMS  Examination Guidelines: A complete evaluation includes B-mode imaging, spectral Doppler, color Doppler, and power Doppler as needed of all accessible portions of each vessel. Bilateral testing is considered an integral part of a complete examination. Limited examinations for reoccurring indications may be performed as noted. The reflux portion of the exam is performed with the patient in reverse Trendelenburg.  +---------+---------------+---------+-----------+----------+--------------+ RIGHT    CompressibilityPhasicitySpontaneityPropertiesThrombus Aging +---------+---------------+---------+-----------+----------+--------------+ CFV      Full           Yes      Yes                                 +---------+---------------+---------+-----------+----------+--------------+ SFJ      Full                                                        +---------+---------------+---------+-----------+----------+--------------+ FV Prox  Full           Yes      Yes                                 +---------+---------------+---------+-----------+----------+--------------+ FV Mid   Full           Yes      Yes                                 +---------+---------------+---------+-----------+----------+--------------+ FV DistalFull           Yes      Yes                                 +---------+---------------+---------+-----------+----------+--------------+ PFV      Full                                                        +---------+---------------+---------+-----------+----------+--------------+ POP      Full           Yes      Yes                                  +---------+---------------+---------+-----------+----------+--------------+ PTV      None  Systemic Diam: 2.50 cm [name] MD Electronically signed by [name] MD Signature Date/Time: 04/15/2023/3:27:38 PM    Final    VAS [place] LOWER EXTREMITY VENOUS (DVT)  Result Date: 04/15/2023  Lower Venous DVT Study Patient Name:  Samuel [name]  Date of Exam:   04/15/2023 Medical Rec #: [id number]    Accession #:    [id number] Date of Birth: 08-Oct-1945    Patient  Gender: M Patient Age:   77 years Exam Location:  [facility] Procedure:      VAS [place] LOWER EXTREMITY VENOUS (DVT) Referring Phys: [facility] GOEL --------------------------------------------------------------------------------  Indications: Pulmonary embolism.  Comparison Study: No previous exams Performing Technologist: Jody Hill RVT, RDMS  Examination Guidelines: A complete evaluation includes B-mode imaging, spectral Doppler, color Doppler, and power Doppler as needed of all accessible portions of each vessel. Bilateral testing is considered an integral part of a complete examination. Limited examinations for reoccurring indications may be performed as noted. The reflux portion of the exam is performed with the patient in reverse Trendelenburg.  +---------+---------------+---------+-----------+----------+--------------+ RIGHT    CompressibilityPhasicitySpontaneityPropertiesThrombus Aging +---------+---------------+---------+-----------+----------+--------------+ CFV      Full           Yes      Yes                                 +---------+---------------+---------+-----------+----------+--------------+ SFJ      Full                                                        +---------+---------------+---------+-----------+----------+--------------+ FV Prox  Full           Yes      Yes                                 +---------+---------------+---------+-----------+----------+--------------+ FV Mid   Full           Yes      Yes                                 +---------+---------------+---------+-----------+----------+--------------+ FV DistalFull           Yes      Yes                                 +---------+---------------+---------+-----------+----------+--------------+ PFV      Full                                                        +---------+---------------+---------+-----------+----------+--------------+ POP      Full           Yes      Yes                                  +---------+---------------+---------+-----------+----------+--------------+ PTV      None  Physician Discharge Summary  Samuel [name] ZOX:[id number] DOB: 07/12/1945 DOA: 04/14/2023  PCP: Samuel Register, MD  Admit date: 04/14/2023 Discharge date: 04/25/2023  Admitted From: Home Disposition: Home  Recommendations for Outpatient Follow-up:  Follow up with PCP in 1-2 weeks Follow-up with oncology and radiation oncology as scheduled  Home Health: PT OT nursing Equipment/Devices: Rolling walker, tube feed equipment, oral suction  Discharge Condition: Stable CODE STATUS: Full Diet recommendation: N.p.o., tube feeds as below  Brief/Interim Summary: Patient 77 year old white male with insidious onset of dysphagia, initially solids transitioning to liquids approximately 2 weeks ago now unable to tolerate any p.o. without profound pleuritic chest pain.  Initial imaging remarkable for right-sided PE, EKG showing secondary AV block with symptomatic bradycardia ultimately transferred to [facility] after requiring atropine for evaluation by cardiology/EP for possible pacemaker evaluation.   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  Outpatient follow-up with EP/cardiology as appropriate.  Patient otherwise stable and agreeable for discharge home, now ambulating independently without dysfunction -  10/22 - Medically stable for discharge - awaiting safe dispo(equipment delivery/home prep).   Discharge Diagnoses:  Principal Problem:    Acute pulmonary embolism (HCC) Active Problems:   Dysphagia   Dehydration   LFT elevation   Swelling of clavicular region   Pulmonary embolism (HCC)   Troponin I above reference range   Bradycardia   AV block, Mobitz 2   Malignant neoplasm of lower third of esophagus (HCC)   Protein-calorie malnutrition, severe  Adenocarcinoma of the esophagus Concurrent profound dysphagia Severe protein-caloric malnutrition -Biopsy confirms intramucosal adenocarcinoma -Patient is not a candidate for esophageal stent or NG tube placement given stricture/anatomy  -G-tube placement today 10/17 per IR -CT abdomen without clear evidence for metastatic disease of the abdomen or pelvis, notable indeterminate focus of the L5 vertebral body but otherwise notable pancreatic and renal cyst -Oncology following, plan for neoadjuvant chemo and radiation with ultimate goal of esophagectomy if possible -PET scan for staging after hospital discharge per oncology   At risk for refeeding syndrome  -Continue to monitor magnesium and phosphorus, supplementation ongoing for 7 days, repeat labs with PCP in the next 1 to 2 weeks to reevaluate   Acute PE with right heart strain -Transition to DOAC (Eliquis) -Presumed to be provoked secondary to malignancy   Intermittent Mobitz II AV block , 2:1 which is responsive to ambulation, improving -Asymptomatic, echo shows normal ventricular function  -EP consult following, given resolution of bradycardia over the weekend no current indication for for urgent pacemaker placement, okay to follow-up outpatient with EP to father for future needs -If bradycardia worsens or patient becomes symptomatic EP will reevaluate as indicated   Mild hypokalemia, resolved Follow repeat labs   Prediabetes A1c 6.2 Metformin allegedly causes diarrhea -continue tube feeds, have resumed metformin but if patient becomes symptomatic again with diarrhea it would be reasonable to hold this medication  and discussed with PCP    Subclinical hyperthyroidism TSH depressed, T4 within normal limits Follow outpatient for repeat labs in the next few weeks   Hyperbilirubinemia, elevated AST/ALT  Physician Discharge Summary  Samuel [name] ZOX:[id number] DOB: 07/12/1945 DOA: 04/14/2023  PCP: Samuel Register, MD  Admit date: 04/14/2023 Discharge date: 04/25/2023  Admitted From: Home Disposition: Home  Recommendations for Outpatient Follow-up:  Follow up with PCP in 1-2 weeks Follow-up with oncology and radiation oncology as scheduled  Home Health: PT OT nursing Equipment/Devices: Rolling walker, tube feed equipment, oral suction  Discharge Condition: Stable CODE STATUS: Full Diet recommendation: N.p.o., tube feeds as below  Brief/Interim Summary: Patient 77 year old white male with insidious onset of dysphagia, initially solids transitioning to liquids approximately 2 weeks ago now unable to tolerate any p.o. without profound pleuritic chest pain.  Initial imaging remarkable for right-sided PE, EKG showing secondary AV block with symptomatic bradycardia ultimately transferred to [facility] after requiring atropine for evaluation by cardiology/EP for possible pacemaker evaluation.   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  Outpatient follow-up with EP/cardiology as appropriate.  Patient otherwise stable and agreeable for discharge home, now ambulating independently without dysfunction -  10/22 - Medically stable for discharge - awaiting safe dispo(equipment delivery/home prep).   Discharge Diagnoses:  Principal Problem:    Acute pulmonary embolism (HCC) Active Problems:   Dysphagia   Dehydration   LFT elevation   Swelling of clavicular region   Pulmonary embolism (HCC)   Troponin I above reference range   Bradycardia   AV block, Mobitz 2   Malignant neoplasm of lower third of esophagus (HCC)   Protein-calorie malnutrition, severe  Adenocarcinoma of the esophagus Concurrent profound dysphagia Severe protein-caloric malnutrition -Biopsy confirms intramucosal adenocarcinoma -Patient is not a candidate for esophageal stent or NG tube placement given stricture/anatomy  -G-tube placement today 10/17 per IR -CT abdomen without clear evidence for metastatic disease of the abdomen or pelvis, notable indeterminate focus of the L5 vertebral body but otherwise notable pancreatic and renal cyst -Oncology following, plan for neoadjuvant chemo and radiation with ultimate goal of esophagectomy if possible -PET scan for staging after hospital discharge per oncology   At risk for refeeding syndrome  -Continue to monitor magnesium and phosphorus, supplementation ongoing for 7 days, repeat labs with PCP in the next 1 to 2 weeks to reevaluate   Acute PE with right heart strain -Transition to DOAC (Eliquis) -Presumed to be provoked secondary to malignancy   Intermittent Mobitz II AV block , 2:1 which is responsive to ambulation, improving -Asymptomatic, echo shows normal ventricular function  -EP consult following, given resolution of bradycardia over the weekend no current indication for for urgent pacemaker placement, okay to follow-up outpatient with EP to father for future needs -If bradycardia worsens or patient becomes symptomatic EP will reevaluate as indicated   Mild hypokalemia, resolved Follow repeat labs   Prediabetes A1c 6.2 Metformin allegedly causes diarrhea -continue tube feeds, have resumed metformin but if patient becomes symptomatic again with diarrhea it would be reasonable to hold this medication  and discussed with PCP    Subclinical hyperthyroidism TSH depressed, T4 within normal limits Follow outpatient for repeat labs in the next few weeks   Hyperbilirubinemia, elevated AST/ALT  Physician Discharge Summary  Samuel [name] ZOX:[id number] DOB: 07/12/1945 DOA: 04/14/2023  PCP: Samuel Register, MD  Admit date: 04/14/2023 Discharge date: 04/25/2023  Admitted From: Home Disposition: Home  Recommendations for Outpatient Follow-up:  Follow up with PCP in 1-2 weeks Follow-up with oncology and radiation oncology as scheduled  Home Health: PT OT nursing Equipment/Devices: Rolling walker, tube feed equipment, oral suction  Discharge Condition: Stable CODE STATUS: Full Diet recommendation: N.p.o., tube feeds as below  Brief/Interim Summary: Patient 77 year old white male with insidious onset of dysphagia, initially solids transitioning to liquids approximately 2 weeks ago now unable to tolerate any p.o. without profound pleuritic chest pain.  Initial imaging remarkable for right-sided PE, EKG showing secondary AV block with symptomatic bradycardia ultimately transferred to [facility] after requiring atropine for evaluation by cardiology/EP for possible pacemaker evaluation.   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  Outpatient follow-up with EP/cardiology as appropriate.  Patient otherwise stable and agreeable for discharge home, now ambulating independently without dysfunction -  10/22 - Medically stable for discharge - awaiting safe dispo(equipment delivery/home prep).   Discharge Diagnoses:  Principal Problem:    Acute pulmonary embolism (HCC) Active Problems:   Dysphagia   Dehydration   LFT elevation   Swelling of clavicular region   Pulmonary embolism (HCC)   Troponin I above reference range   Bradycardia   AV block, Mobitz 2   Malignant neoplasm of lower third of esophagus (HCC)   Protein-calorie malnutrition, severe  Adenocarcinoma of the esophagus Concurrent profound dysphagia Severe protein-caloric malnutrition -Biopsy confirms intramucosal adenocarcinoma -Patient is not a candidate for esophageal stent or NG tube placement given stricture/anatomy  -G-tube placement today 10/17 per IR -CT abdomen without clear evidence for metastatic disease of the abdomen or pelvis, notable indeterminate focus of the L5 vertebral body but otherwise notable pancreatic and renal cyst -Oncology following, plan for neoadjuvant chemo and radiation with ultimate goal of esophagectomy if possible -PET scan for staging after hospital discharge per oncology   At risk for refeeding syndrome  -Continue to monitor magnesium and phosphorus, supplementation ongoing for 7 days, repeat labs with PCP in the next 1 to 2 weeks to reevaluate   Acute PE with right heart strain -Transition to DOAC (Eliquis) -Presumed to be provoked secondary to malignancy   Intermittent Mobitz II AV block , 2:1 which is responsive to ambulation, improving -Asymptomatic, echo shows normal ventricular function  -EP consult following, given resolution of bradycardia over the weekend no current indication for for urgent pacemaker placement, okay to follow-up outpatient with EP to father for future needs -If bradycardia worsens or patient becomes symptomatic EP will reevaluate as indicated   Mild hypokalemia, resolved Follow repeat labs   Prediabetes A1c 6.2 Metformin allegedly causes diarrhea -continue tube feeds, have resumed metformin but if patient becomes symptomatic again with diarrhea it would be reasonable to hold this medication  and discussed with PCP    Subclinical hyperthyroidism TSH depressed, T4 within normal limits Follow outpatient for repeat labs in the next few weeks   Hyperbilirubinemia, elevated AST/ALT  Systemic Diam: 2.50 cm [name] MD Electronically signed by [name] MD Signature Date/Time: 04/15/2023/3:27:38 PM    Final    VAS [place] LOWER EXTREMITY VENOUS (DVT)  Result Date: 04/15/2023  Lower Venous DVT Study Patient Name:  Samuel [name]  Date of Exam:   04/15/2023 Medical Rec #: [id number]    Accession #:    [id number] Date of Birth: 08-Oct-1945    Patient  Gender: M Patient Age:   77 years Exam Location:  [facility] Procedure:      VAS [place] LOWER EXTREMITY VENOUS (DVT) Referring Phys: [facility] GOEL --------------------------------------------------------------------------------  Indications: Pulmonary embolism.  Comparison Study: No previous exams Performing Technologist: Jody Hill RVT, RDMS  Examination Guidelines: A complete evaluation includes B-mode imaging, spectral Doppler, color Doppler, and power Doppler as needed of all accessible portions of each vessel. Bilateral testing is considered an integral part of a complete examination. Limited examinations for reoccurring indications may be performed as noted. The reflux portion of the exam is performed with the patient in reverse Trendelenburg.  +---------+---------------+---------+-----------+----------+--------------+ RIGHT    CompressibilityPhasicitySpontaneityPropertiesThrombus Aging +---------+---------------+---------+-----------+----------+--------------+ CFV      Full           Yes      Yes                                 +---------+---------------+---------+-----------+----------+--------------+ SFJ      Full                                                        +---------+---------------+---------+-----------+----------+--------------+ FV Prox  Full           Yes      Yes                                 +---------+---------------+---------+-----------+----------+--------------+ FV Mid   Full           Yes      Yes                                 +---------+---------------+---------+-----------+----------+--------------+ FV DistalFull           Yes      Yes                                 +---------+---------------+---------+-----------+----------+--------------+ PFV      Full                                                        +---------+---------------+---------+-----------+----------+--------------+ POP      Full           Yes      Yes                                  +---------+---------------+---------+-----------+----------+--------------+ PTV      None  Systemic Diam: 2.50 cm [name] MD Electronically signed by [name] MD Signature Date/Time: 04/15/2023/3:27:38 PM    Final    VAS [place] LOWER EXTREMITY VENOUS (DVT)  Result Date: 04/15/2023  Lower Venous DVT Study Patient Name:  Samuel [name]  Date of Exam:   04/15/2023 Medical Rec #: [id number]    Accession #:    [id number] Date of Birth: 08-Oct-1945    Patient  Gender: M Patient Age:   77 years Exam Location:  [facility] Procedure:      VAS [place] LOWER EXTREMITY VENOUS (DVT) Referring Phys: [facility] GOEL --------------------------------------------------------------------------------  Indications: Pulmonary embolism.  Comparison Study: No previous exams Performing Technologist: Jody Hill RVT, RDMS  Examination Guidelines: A complete evaluation includes B-mode imaging, spectral Doppler, color Doppler, and power Doppler as needed of all accessible portions of each vessel. Bilateral testing is considered an integral part of a complete examination. Limited examinations for reoccurring indications may be performed as noted. The reflux portion of the exam is performed with the patient in reverse Trendelenburg.  +---------+---------------+---------+-----------+----------+--------------+ RIGHT    CompressibilityPhasicitySpontaneityPropertiesThrombus Aging +---------+---------------+---------+-----------+----------+--------------+ CFV      Full           Yes      Yes                                 +---------+---------------+---------+-----------+----------+--------------+ SFJ      Full                                                        +---------+---------------+---------+-----------+----------+--------------+ FV Prox  Full           Yes      Yes                                 +---------+---------------+---------+-----------+----------+--------------+ FV Mid   Full           Yes      Yes                                 +---------+---------------+---------+-----------+----------+--------------+ FV DistalFull           Yes      Yes                                 +---------+---------------+---------+-----------+----------+--------------+ PFV      Full                                                        +---------+---------------+---------+-----------+----------+--------------+ POP      Full           Yes      Yes                                  +---------+---------------+---------+-----------+----------+--------------+ PTV      None  Physician Discharge Summary  Samuel [name] ZOX:[id number] DOB: 07/12/1945 DOA: 04/14/2023  PCP: Samuel Register, MD  Admit date: 04/14/2023 Discharge date: 04/25/2023  Admitted From: Home Disposition: Home  Recommendations for Outpatient Follow-up:  Follow up with PCP in 1-2 weeks Follow-up with oncology and radiation oncology as scheduled  Home Health: PT OT nursing Equipment/Devices: Rolling walker, tube feed equipment, oral suction  Discharge Condition: Stable CODE STATUS: Full Diet recommendation: N.p.o., tube feeds as below  Brief/Interim Summary: Patient 77 year old white male with insidious onset of dysphagia, initially solids transitioning to liquids approximately 2 weeks ago now unable to tolerate any p.o. without profound pleuritic chest pain.  Initial imaging remarkable for right-sided PE, EKG showing secondary AV block with symptomatic bradycardia ultimately transferred to [facility] after requiring atropine for evaluation by cardiology/EP for possible pacemaker evaluation.   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  Outpatient follow-up with EP/cardiology as appropriate.  Patient otherwise stable and agreeable for discharge home, now ambulating independently without dysfunction -  10/22 - Medically stable for discharge - awaiting safe dispo(equipment delivery/home prep).   Discharge Diagnoses:  Principal Problem:    Acute pulmonary embolism (HCC) Active Problems:   Dysphagia   Dehydration   LFT elevation   Swelling of clavicular region   Pulmonary embolism (HCC)   Troponin I above reference range   Bradycardia   AV block, Mobitz 2   Malignant neoplasm of lower third of esophagus (HCC)   Protein-calorie malnutrition, severe  Adenocarcinoma of the esophagus Concurrent profound dysphagia Severe protein-caloric malnutrition -Biopsy confirms intramucosal adenocarcinoma -Patient is not a candidate for esophageal stent or NG tube placement given stricture/anatomy  -G-tube placement today 10/17 per IR -CT abdomen without clear evidence for metastatic disease of the abdomen or pelvis, notable indeterminate focus of the L5 vertebral body but otherwise notable pancreatic and renal cyst -Oncology following, plan for neoadjuvant chemo and radiation with ultimate goal of esophagectomy if possible -PET scan for staging after hospital discharge per oncology   At risk for refeeding syndrome  -Continue to monitor magnesium and phosphorus, supplementation ongoing for 7 days, repeat labs with PCP in the next 1 to 2 weeks to reevaluate   Acute PE with right heart strain -Transition to DOAC (Eliquis) -Presumed to be provoked secondary to malignancy   Intermittent Mobitz II AV block , 2:1 which is responsive to ambulation, improving -Asymptomatic, echo shows normal ventricular function  -EP consult following, given resolution of bradycardia over the weekend no current indication for for urgent pacemaker placement, okay to follow-up outpatient with EP to father for future needs -If bradycardia worsens or patient becomes symptomatic EP will reevaluate as indicated   Mild hypokalemia, resolved Follow repeat labs   Prediabetes A1c 6.2 Metformin allegedly causes diarrhea -continue tube feeds, have resumed metformin but if patient becomes symptomatic again with diarrhea it would be reasonable to hold this medication  and discussed with PCP    Subclinical hyperthyroidism TSH depressed, T4 within normal limits Follow outpatient for repeat labs in the next few weeks   Hyperbilirubinemia, elevated AST/ALT  interpreted by Roanna Banning, MD. FLUOROSCOPY: Radiation Exposure Index (as provided by the fluoroscopic device): 13 mGy Kerma COMPARISON:  CT CHEST 04/14/2023. FINDINGS: Swallowing: Appears normal. No vestibular penetration or aspiration seen. Pharynx:  No masses, lesions or ulcers observed. Esophagus: Abrupt  tapering in the thoracic esophagus with only a trickle of barium observed to intermittently pass into the stomach. Unable to assess for motility, hiatal hernia, and gastroesophageal reflux. 13mm barium tablet was not given. 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 Correlate with direct endoscopic visualization. Electronically Signed   By: Roanna Banning M.D.   On: 04/17/2023 10:24   ECHOCARDIOGRAM COMPLETE  Result Date: 04/15/2023    ECHOCARDIOGRAM REPORT   Patient Name:   Samuel [name] Date of Exam: 04/15/2023 Medical Rec #:  [id number]   Height:       66.0 in Accession #:    [id number]  Weight:       203.9 lb Date of Birth:  [birth year]   BSA:          2.016 m Patient Age:    77 years    BP:           127/67 mmHg Patient Gender: M           HR:           61 bpm. Exam Location:  Inpatient Procedure: 2D Echo, Cardiac Doppler and Color Doppler Indications:    Pulmonary embolus  History:        Patient has no prior history of Echocardiogram examinations.                 Arrythmias:Bradycardia; Risk Factors:Diabetes and Former Smoker.  Sonographer:    Milda Smart Referring Phys: [id number] [facility] GOEL  Sonographer Comments: Image acquisition challenging due to respiratory motion. IMPRESSIONS  1. Left ventricular ejection fraction, by estimation, is 55%. The left ventricle has normal function. The left ventricle has no regional wall motion abnormalities. Left ventricular diastolic parameters are consistent with Grade I diastolic dysfunction (impaired relaxation).  2. Right ventricular systolic function is normal. The right ventricular size is normal. Tricuspid regurgitation signal is inadequate for assessing PA pressure.  3. The mitral valve is normal in structure. Trivial mitral valve regurgitation. No evidence of mitral stenosis.  4. The aortic valve is grossly normal. Aortic valve regurgitation is mild to moderate. Aortic valve  sclerosis is present, with no evidence of aortic valve stenosis.  5. The inferior vena cava is normal in size with greater than 50% respiratory variability, suggesting right atrial pressure of 3 mmHg. FINDINGS  Left Ventricle: Left ventricular ejection fraction, by estimation, is 55%. The left ventricle has normal function. The left ventricle has no regional wall motion abnormalities. The left ventricular internal cavity size was normal in size. There is no left ventricular hypertrophy. Left ventricular diastolic parameters are consistent with Grade I diastolic dysfunction (impaired relaxation). Right Ventricle: The right ventricular size is normal. No increase in right ventricular wall thickness. Right ventricular systolic function is normal. Tricuspid regurgitation signal is inadequate for assessing PA pressure. The tricuspid regurgitant velocity is 1.34 m/s, and with an assumed right atrial pressure of 3 mmHg, the estimated right ventricular systolic pressure is 10.2 mmHg. Left Atrium: Left atrial size was normal in size. Right Atrium: Right atrial size was normal in size. Pericardium: There is no evidence of pericardial effusion. Mitral Valve: The mitral valve is normal in structure. Trivial mitral valve regurgitation. No  Physician Discharge Summary  Samuel [name] ZOX:[id number] DOB: 07/12/1945 DOA: 04/14/2023  PCP: Samuel Register, MD  Admit date: 04/14/2023 Discharge date: 04/25/2023  Admitted From: Home Disposition: Home  Recommendations for Outpatient Follow-up:  Follow up with PCP in 1-2 weeks Follow-up with oncology and radiation oncology as scheduled  Home Health: PT OT nursing Equipment/Devices: Rolling walker, tube feed equipment, oral suction  Discharge Condition: Stable CODE STATUS: Full Diet recommendation: N.p.o., tube feeds as below  Brief/Interim Summary: Patient 77 year old white male with insidious onset of dysphagia, initially solids transitioning to liquids approximately 2 weeks ago now unable to tolerate any p.o. without profound pleuritic chest pain.  Initial imaging remarkable for right-sided PE, EKG showing secondary AV block with symptomatic bradycardia ultimately transferred to [facility] after requiring atropine for evaluation by cardiology/EP for possible pacemaker evaluation.   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  Outpatient follow-up with EP/cardiology as appropriate.  Patient otherwise stable and agreeable for discharge home, now ambulating independently without dysfunction -  10/22 - Medically stable for discharge - awaiting safe dispo(equipment delivery/home prep).   Discharge Diagnoses:  Principal Problem:    Acute pulmonary embolism (HCC) Active Problems:   Dysphagia   Dehydration   LFT elevation   Swelling of clavicular region   Pulmonary embolism (HCC)   Troponin I above reference range   Bradycardia   AV block, Mobitz 2   Malignant neoplasm of lower third of esophagus (HCC)   Protein-calorie malnutrition, severe  Adenocarcinoma of the esophagus Concurrent profound dysphagia Severe protein-caloric malnutrition -Biopsy confirms intramucosal adenocarcinoma -Patient is not a candidate for esophageal stent or NG tube placement given stricture/anatomy  -G-tube placement today 10/17 per IR -CT abdomen without clear evidence for metastatic disease of the abdomen or pelvis, notable indeterminate focus of the L5 vertebral body but otherwise notable pancreatic and renal cyst -Oncology following, plan for neoadjuvant chemo and radiation with ultimate goal of esophagectomy if possible -PET scan for staging after hospital discharge per oncology   At risk for refeeding syndrome  -Continue to monitor magnesium and phosphorus, supplementation ongoing for 7 days, repeat labs with PCP in the next 1 to 2 weeks to reevaluate   Acute PE with right heart strain -Transition to DOAC (Eliquis) -Presumed to be provoked secondary to malignancy   Intermittent Mobitz II AV block , 2:1 which is responsive to ambulation, improving -Asymptomatic, echo shows normal ventricular function  -EP consult following, given resolution of bradycardia over the weekend no current indication for for urgent pacemaker placement, okay to follow-up outpatient with EP to father for future needs -If bradycardia worsens or patient becomes symptomatic EP will reevaluate as indicated   Mild hypokalemia, resolved Follow repeat labs   Prediabetes A1c 6.2 Metformin allegedly causes diarrhea -continue tube feeds, have resumed metformin but if patient becomes symptomatic again with diarrhea it would be reasonable to hold this medication  and discussed with PCP    Subclinical hyperthyroidism TSH depressed, T4 within normal limits Follow outpatient for repeat labs in the next few weeks   Hyperbilirubinemia, elevated AST/ALT  Systemic Diam: 2.50 cm [name] MD Electronically signed by [name] MD Signature Date/Time: 04/15/2023/3:27:38 PM    Final    VAS [place] LOWER EXTREMITY VENOUS (DVT)  Result Date: 04/15/2023  Lower Venous DVT Study Patient Name:  Samuel [name]  Date of Exam:   04/15/2023 Medical Rec #: [id number]    Accession #:    [id number] Date of Birth: 08-Oct-1945    Patient  Gender: M Patient Age:   77 years Exam Location:  [facility] Procedure:      VAS [place] LOWER EXTREMITY VENOUS (DVT) Referring Phys: [facility] GOEL --------------------------------------------------------------------------------  Indications: Pulmonary embolism.  Comparison Study: No previous exams Performing Technologist: Jody Hill RVT, RDMS  Examination Guidelines: A complete evaluation includes B-mode imaging, spectral Doppler, color Doppler, and power Doppler as needed of all accessible portions of each vessel. Bilateral testing is considered an integral part of a complete examination. Limited examinations for reoccurring indications may be performed as noted. The reflux portion of the exam is performed with the patient in reverse Trendelenburg.  +---------+---------------+---------+-----------+----------+--------------+ RIGHT    CompressibilityPhasicitySpontaneityPropertiesThrombus Aging +---------+---------------+---------+-----------+----------+--------------+ CFV      Full           Yes      Yes                                 +---------+---------------+---------+-----------+----------+--------------+ SFJ      Full                                                        +---------+---------------+---------+-----------+----------+--------------+ FV Prox  Full           Yes      Yes                                 +---------+---------------+---------+-----------+----------+--------------+ FV Mid   Full           Yes      Yes                                 +---------+---------------+---------+-----------+----------+--------------+ FV DistalFull           Yes      Yes                                 +---------+---------------+---------+-----------+----------+--------------+ PFV      Full                                                        +---------+---------------+---------+-----------+----------+--------------+ POP      Full           Yes      Yes                                  +---------+---------------+---------+-----------+----------+--------------+ PTV      None  Physician Discharge Summary  Samuel [name] ZOX:[id number] DOB: 07/12/1945 DOA: 04/14/2023  PCP: Samuel Register, MD  Admit date: 04/14/2023 Discharge date: 04/25/2023  Admitted From: Home Disposition: Home  Recommendations for Outpatient Follow-up:  Follow up with PCP in 1-2 weeks Follow-up with oncology and radiation oncology as scheduled  Home Health: PT OT nursing Equipment/Devices: Rolling walker, tube feed equipment, oral suction  Discharge Condition: Stable CODE STATUS: Full Diet recommendation: N.p.o., tube feeds as below  Brief/Interim Summary: Patient 77 year old white male with insidious onset of dysphagia, initially solids transitioning to liquids approximately 2 weeks ago now unable to tolerate any p.o. without profound pleuritic chest pain.  Initial imaging remarkable for right-sided PE, EKG showing secondary AV block with symptomatic bradycardia ultimately transferred to [facility] after requiring atropine for evaluation by cardiology/EP for possible pacemaker evaluation.   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  Outpatient follow-up with EP/cardiology as appropriate.  Patient otherwise stable and agreeable for discharge home, now ambulating independently without dysfunction -  10/22 - Medically stable for discharge - awaiting safe dispo(equipment delivery/home prep).   Discharge Diagnoses:  Principal Problem:    Acute pulmonary embolism (HCC) Active Problems:   Dysphagia   Dehydration   LFT elevation   Swelling of clavicular region   Pulmonary embolism (HCC)   Troponin I above reference range   Bradycardia   AV block, Mobitz 2   Malignant neoplasm of lower third of esophagus (HCC)   Protein-calorie malnutrition, severe  Adenocarcinoma of the esophagus Concurrent profound dysphagia Severe protein-caloric malnutrition -Biopsy confirms intramucosal adenocarcinoma -Patient is not a candidate for esophageal stent or NG tube placement given stricture/anatomy  -G-tube placement today 10/17 per IR -CT abdomen without clear evidence for metastatic disease of the abdomen or pelvis, notable indeterminate focus of the L5 vertebral body but otherwise notable pancreatic and renal cyst -Oncology following, plan for neoadjuvant chemo and radiation with ultimate goal of esophagectomy if possible -PET scan for staging after hospital discharge per oncology   At risk for refeeding syndrome  -Continue to monitor magnesium and phosphorus, supplementation ongoing for 7 days, repeat labs with PCP in the next 1 to 2 weeks to reevaluate   Acute PE with right heart strain -Transition to DOAC (Eliquis) -Presumed to be provoked secondary to malignancy   Intermittent Mobitz II AV block , 2:1 which is responsive to ambulation, improving -Asymptomatic, echo shows normal ventricular function  -EP consult following, given resolution of bradycardia over the weekend no current indication for for urgent pacemaker placement, okay to follow-up outpatient with EP to father for future needs -If bradycardia worsens or patient becomes symptomatic EP will reevaluate as indicated   Mild hypokalemia, resolved Follow repeat labs   Prediabetes A1c 6.2 Metformin allegedly causes diarrhea -continue tube feeds, have resumed metformin but if patient becomes symptomatic again with diarrhea it would be reasonable to hold this medication  and discussed with PCP    Subclinical hyperthyroidism TSH depressed, T4 within normal limits Follow outpatient for repeat labs in the next few weeks   Hyperbilirubinemia, elevated AST/ALT  Systemic Diam: 2.50 cm [name] MD Electronically signed by [name] MD Signature Date/Time: 04/15/2023/3:27:38 PM    Final    VAS [place] LOWER EXTREMITY VENOUS (DVT)  Result Date: 04/15/2023  Lower Venous DVT Study Patient Name:  Samuel [name]  Date of Exam:   04/15/2023 Medical Rec #: [id number]    Accession #:    [id number] Date of Birth: 08-Oct-1945    Patient  Gender: M Patient Age:   77 years Exam Location:  [facility] Procedure:      VAS [place] LOWER EXTREMITY VENOUS (DVT) Referring Phys: [facility] GOEL --------------------------------------------------------------------------------  Indications: Pulmonary embolism.  Comparison Study: No previous exams Performing Technologist: Jody Hill RVT, RDMS  Examination Guidelines: A complete evaluation includes B-mode imaging, spectral Doppler, color Doppler, and power Doppler as needed of all accessible portions of each vessel. Bilateral testing is considered an integral part of a complete examination. Limited examinations for reoccurring indications may be performed as noted. The reflux portion of the exam is performed with the patient in reverse Trendelenburg.  +---------+---------------+---------+-----------+----------+--------------+ RIGHT    CompressibilityPhasicitySpontaneityPropertiesThrombus Aging +---------+---------------+---------+-----------+----------+--------------+ CFV      Full           Yes      Yes                                 +---------+---------------+---------+-----------+----------+--------------+ SFJ      Full                                                        +---------+---------------+---------+-----------+----------+--------------+ FV Prox  Full           Yes      Yes                                 +---------+---------------+---------+-----------+----------+--------------+ FV Mid   Full           Yes      Yes                                 +---------+---------------+---------+-----------+----------+--------------+ FV DistalFull           Yes      Yes                                 +---------+---------------+---------+-----------+----------+--------------+ PFV      Full                                                        +---------+---------------+---------+-----------+----------+--------------+ POP      Full           Yes      Yes                                  +---------+---------------+---------+-----------+----------+--------------+ PTV      None

## 2023-04-25 NOTE — Progress Notes (Signed)
   HRs remain stable.   We strongly suspect that the brady was related to his choking and inability to swallow, that has improved as he has been treated. Currently, he has no indication for PPM. Avoid AV nodal blocking drugs, electrolyte abnormality, and choking/gaging when swallowing.   He can proceed with chemo and XRT as outlined by onc and Rad Onc.   If bradycardia recurs and impedes his treatment, pacing can be revisited at that time.   Outpatient EP follow up arranged. Please call with further questions.   Casimiro Needle [id number] [place], PA-C  04/25/2023 7:18 AM

## 2023-04-25 NOTE — Progress Notes (Signed)
Discharge:   AVS and medication list reviewed with patient.  Pt had several questions including how he thinks he misplaced his glucometer so he wasn't sure if he should still check it.  He was advised to check it as least 3 times a day and can follow up with his PCP about reducing checks, restarting metformin or starting insulin.

## 2023-04-25 NOTE — TOC Transition Note (Signed)
Transition of Care (TOC) - CM/SW Discharge Note Donn Pierini RN, BSN Transitions of Care Unit 4E- RN Case Manager See Treatment Team for direct phone #   Patient Details  Name: Samuel [name] MRN: [id number] Date of Birth: 02-28-1946  Transition of Care [facility]) CM/SW Contact:  Darrold Span, RN Phone Number: 04/25/2023, 11:05 AM   Clinical Narrative:    Pt stable for transition home today,  DME has been set up w/ [employer]- confirmed RW has been delivered to the room, pt has spoken with [employer] regarding delivery of suction to the home today.   TF and supplies have been set up with [employer] and will be delivered this afternoon around 4pm. Liaison Pam has seen pt this am- education has been completed and pt set to go per [employer] for home TF needs.  Dicussed with bedside RN- pt will discharge after his 3pm bolus feed.   Friends to transport home. CM spoke with pt at bedside- no further TOC needs noted. [facility]    Final next level of care: Home/Self Care Barriers to Discharge: Barriers Resolved   Patient Goals and CMS Choice CMS Medicare.gov Compare Post Acute Care list provided to:: Patient Choice offered to / list presented to : Patient (friends)  Discharge Placement                   Home       Discharge Plan and Services Additional resources added to the After Visit Summary for     Discharge Planning Services: CM Consult Post Acute Care Choice: Home Health, Durable Medical Equipment          DME Arranged: Tube feeding DME Agency: Other - Comment Julianne Rice) Date DME Agency Contacted: 04/21/23   Representative spoke with at DME Agency: Elita Quick HH Arranged: NA HH Agency: NA        Social Determinants of Health (SDOH) Interventions SDOH Screenings   Food Insecurity: No Food Insecurity (04/15/2023)  Housing: Low Risk  (04/15/2023)  Transportation Needs: No Transportation Needs (04/15/2023)  Utilities: Not At Risk (04/15/2023)   Alcohol Screen: Low Risk  (08/19/2022)  Depression (PHQ2-9): Low Risk  (12/15/2022)  Financial Resource Strain: Low Risk  (08/19/2022)  Physical Activity: Sufficiently Active (08/19/2022)  Social Connections: Moderately Integrated (08/19/2022)  Stress: No Stress Concern Present (08/19/2022)  Tobacco Use: Medium Risk (04/15/2023)     Readmission Risk Interventions    04/25/2023   11:05 AM  Readmission Risk Prevention Plan  Post Dischage Appt Complete  Medication Screening Complete  Transportation Screening Complete

## 2023-04-25 NOTE — Progress Notes (Signed)
Tube feedings and medication administration.   Pt educated regarding Osmolite boluses and medication administration.  Pt verbal about teaching he received yesterday and states he knows how to do it.   Pt asked to perform feeding and medication administration and he refused stating he was already educated yesterday

## 2023-04-26 ENCOUNTER — Telehealth: Payer: Self-pay

## 2023-04-26 DIAGNOSIS — R531 Weakness: Secondary | ICD-10-CM | POA: Diagnosis not present

## 2023-04-26 DIAGNOSIS — C159 Malignant neoplasm of esophagus, unspecified: Secondary | ICD-10-CM | POA: Diagnosis not present

## 2023-04-26 DIAGNOSIS — E43 Unspecified severe protein-calorie malnutrition: Secondary | ICD-10-CM | POA: Diagnosis not present

## 2023-04-26 DIAGNOSIS — R1310 Dysphagia, unspecified: Secondary | ICD-10-CM | POA: Diagnosis not present

## 2023-04-26 DIAGNOSIS — Z931 Gastrostomy status: Secondary | ICD-10-CM | POA: Diagnosis not present

## 2023-04-26 NOTE — Transitions of Care (Post Inpatient/ED Visit) (Signed)
04/26/2023  Name: Samuel [name] MRN: [id number] DOB: April 03, 1946  Today's TOC FU Call Status: Today's TOC FU Call Status:: Unsuccessful Call (1st Attempt) Unsuccessful Call (1st Attempt) Date: 04/26/23  Attempted to reach the patient regarding the most recent Inpatient/ED visit.  Follow Up Plan: Additional outreach attempts will be made to reach the patient to complete the Transitions of Care (Post Inpatient/ED visit) call.   [name], RN, BA, [facility] [place], CRRN [facility] Coordinator, Transition of Care Ph # [phone]

## 2023-04-27 ENCOUNTER — Other Ambulatory Visit: Payer: Self-pay

## 2023-04-27 DIAGNOSIS — C159 Malignant neoplasm of esophagus, unspecified: Secondary | ICD-10-CM

## 2023-04-27 NOTE — Progress Notes (Signed)
PET Scan order and referral orders for radiation oncology & [facility] nutrition orders placed.  [name], MD  Satira Anis, Rodman Pickle, Deliah Boston, RN; Dorothy Puffer, MD; Ronny Bacon, PA-C I saw this pt in [facility], he was discharged 2 days ago.  Olegario Shearer, please order PET and get it scheduled ASAP  Dow Adolph and Erie Noe, please schedule him to see me next week, and nutrition consult (he has feeding tube)  John/Alison, please see him in next few weeks  Thanks  Terrace Arabia

## 2023-04-27 NOTE — Telephone Encounter (Signed)
Call returned to [facility]: [phone] , message left with call back requested.   There is no DPR to speak with Lanora Manis.  The patient will need an appointment with provider before a PCS form can be completed.  It has been more than 90 days since the patient was seen by PCP

## 2023-04-27 NOTE — Telephone Encounter (Addendum)
As per Lanora Manis patient was hospitalized/ discharged Tuesday, 10/22 patient was dx with  esophageal cancer. Patient will see his  oncologist Dr. Blake Divine. Once patient finds out more information from specialist he will follow up with PCP. Patient wanted PCP to be aware.  Informed caller of message below and advise for patient to update DRP at upcoming appointment.

## 2023-04-28 ENCOUNTER — Telehealth: Payer: Self-pay

## 2023-04-28 ENCOUNTER — Telehealth: Payer: Self-pay | Admitting: Hematology

## 2023-04-28 ENCOUNTER — Other Ambulatory Visit: Payer: Self-pay

## 2023-04-28 DIAGNOSIS — E43 Unspecified severe protein-calorie malnutrition: Secondary | ICD-10-CM | POA: Diagnosis not present

## 2023-04-28 DIAGNOSIS — Z931 Gastrostomy status: Secondary | ICD-10-CM | POA: Diagnosis not present

## 2023-04-28 DIAGNOSIS — C159 Malignant neoplasm of esophagus, unspecified: Secondary | ICD-10-CM | POA: Diagnosis not present

## 2023-04-28 DIAGNOSIS — R1310 Dysphagia, unspecified: Secondary | ICD-10-CM | POA: Diagnosis not present

## 2023-04-28 DIAGNOSIS — R531 Weakness: Secondary | ICD-10-CM | POA: Diagnosis not present

## 2023-04-28 NOTE — Patient Instructions (Signed)
Visit Information  Dear Samuel [name],  Thank you for returning my call & taking time to visit with me today. I look forward to talking to you next Friday to discuss your appointment with Dr. Mosetta Putt. Your positive attitude and desire to manage your own care at home are important factors in handling any challenges you may encounter with your present health status. Please don't hesitate to contact me if I can be of assistance to you before our next scheduled telephone appointment.  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   RNCM Clinical Goal(s):  Patient will work with the Care Management team over the next 30 days to address Transition of Care Barriers: Need for Oncology follow up on malignant-appearing esophageal stenosis/biopsy results with Dr. Mosetta Putt on 04/24/23; Adjusting to NPO status and need for Osmolite 1.5 tube feedings to address protein calorie malnutrition  through collaboration with RN Care manager, provider, and care team.   Interventions: Evaluation of current treatment plan related to  self management and patient's adherence to plan as established by provider   Oncology:  (Status: New goal.) Short Term Goal Assessment of understanding of oncology diagnosis:  Assessed patient understanding of cancer diagnosis and recommended treatment plan Reviewed upcoming provider appointments and treatment appointments Assessed available transportation to appointments and treatments. Has consistent/reliable transportation: Yes Assessed support system.  Has consistent/reliable family or other support: Yes PHQ2/PHQ9 performed Nutrition assessment performed  Patient Goals/Self-Care Activities: Participate in Transition of Care Program/Attend TOC scheduled calls Take all medications as prescribed Call pharmacy for medication refills 3-7 days in advance of running out of medications Call provider office for new concerns or questions   Follow Up Plan:  The patient has been provided with contact information for the care management team and has been advised to call with any health related questions or concerns.          Patient verbalizes understanding of instructions and care plan provided today and agrees to view in MyChart. Active MyChart status and patient understanding of how to access instructions and care plan via MyChart confirmed with patient.     The patient has been provided with contact information for the care management team and has been advised to call with any health related questions or concerns.   Please call the care guide team at [phone] if you need to cancel or reschedule your appointment.   Please call 1-800-273-TALK (toll free, 24 hour hotline) if you are experiencing a Mental Health or Behavioral Health Crisis or need someone to talk to.  [name], RN, BA, [facility], CRRN [facility] Coordinator, Transition of Care Ph # [phone]

## 2023-04-28 NOTE — Telephone Encounter (Signed)
noted 

## 2023-04-28 NOTE — Transitions of Care (Post Inpatient/ED Visit) (Signed)
04/28/2023  Name: Samuel [name] MRN: [id number] DOB: 09-Apr-1946  Today's TOC FU Call Status: Today's TOC FU Call Status:: Successful TOC FU Call Completed TOC FU Call Complete Date: 04/28/23 Patient's Name and Date of Birth confirmed.  Transition Care Management Follow-up Telephone Call Date of Discharge: 04/25/23 Discharge Facility: [facility]) Type of Discharge: Inpatient Admission Primary Inpatient Discharge Diagnosis:: Acute pulmonary embolism; esophageal stenosis and neoplasm of lower 3rd of esophagus noted & biopsies taken How have you been since you were released from the hospital?: Better Any questions or concerns?: No  Items Reviewed: Did you receive and understand the discharge instructions provided?: Yes Medications obtained,verified, and reconciled?: Yes (Medications Reviewed) Any new allergies since your discharge?: No Dietary orders reviewed?: Yes Type of Diet Ordered:: patient is NPO and administering bolus tube feedings to himself via peg tube Do you have support at home?: Yes Name of Support/Comfort Primary Source: Patient stated he has lots of support but didn't elaborate during this initial conversation  Medications Reviewed Today: Medications Reviewed Today     Reviewed by Marcos Eke, RN (Registered Nurse) on 04/28/23 at 1024  Med List Status: <None>   Medication Order Taking? Sig Documenting Provider Last Dose Status Informant  apixaban (ELIQUIS) 5 MG TABS tablet [id number] Yes Place 1 tablet (5 mg total) into feeding tube 2 (two) times daily. Azucena Fallen, MD Taking Active   Blood Glucose Monitoring Suppl (ACCU-CHEK GUIDE ME) w/Device Andria Rhein [id number]  USE AS DIRECTED 3 TIMES A DAY Newlin, Enobong, MD  Active Self  glucose blood (ACCU-CHEK GUIDE) test strip [id number]  Use as instructed daily [name], MD  Active Self  Multiple Vitamins-Minerals (MULTIVITAMIN MEN) TABS [id number]  Take 1 tablet by mouth daily with breakfast. [provider]  Active Self  Nutritional Supplements (FEEDING SUPPLEMENT, OSMOLITE 1.5 CAL,) LIQD [id number] Yes Place 237 mLs into feeding tube 6 (six) times daily. Azucena Fallen, MD Taking Active   potassium & sodium phosphates (PHOS-NAK) 280-160-250 MG PACK [id number] Yes Place 1 packet into feeding tube 4 (four) times daily -  with meals and at bedtime for 7 days. Azucena Fallen, MD Taking Active   Water For Irrigation, Sterile (FREE WATER) Criss Rosales [id number] Yes Place 120 mLs into feeding tube 6 (six) times daily. Azucena Fallen, MD Taking Active             Home Care and Equipment/Supplies: Were Home Health Services Ordered?: Yes Name of Home Health Agency[facility] providing [facility], OT, Skilled Nursing ordered on discharge Any new equipment or medical supplies ordered?: Yes Name of Medical supply agency?: [employer] supplied rolling walker, tube feeding and home suction equipment, [facility] providing home tube feeding product = Osmolite 1.5 cartons for 6x/day bolus feedings Were you able to get the equipment/medical supplies?: Yes Do you have any questions related to the use of the equipment/supplies?: Yes  Functional Questionnaire: Do you need assistance with bathing/showering or dressing?: No (has support at home if needed) Do you need assistance with meal preparation?: No Do you need assistance with eating?: No Do you have difficulty maintaining continence: No Do you need assistance with getting out of bed/getting out of a chair/moving?: No Do you have difficulty managing or taking your medications?: No  Follow up appointments reviewed: PCP Follow-up appointment confirmed?: Yes Follow-up Provider: Dr. [name] Specialist [facility] Follow-up appointment confirmed?: Yes Date of Specialist follow-up appointment?: 05/04/23 Follow-Up Specialty Provider:: Dr. Mosetta Putt, Oncology Do you need transportation to your  follow-up appointment?: No Do you understand  care options if your condition(s) worsen?: Yes-patient verbalized understanding    [name], RN, BA, [facility], CRRN [facility] Care Management Coordinator, Transition of Care Ph # [phone]

## 2023-05-01 ENCOUNTER — Encounter
Admission: RE | Admit: 2023-05-01 | Discharge: 2023-05-01 | Disposition: A | Discharge status: Home / Self Care | Payer: 59 | Source: Ambulatory Visit | Attending: Hematology | Admitting: Hematology

## 2023-05-01 DIAGNOSIS — C159 Malignant neoplasm of esophagus, unspecified: Secondary | ICD-10-CM | POA: Diagnosis not present

## 2023-05-01 LAB — GLUCOSE, CAPILLARY: Glucose-Capillary: 123 mg/dL — ABNORMAL HIGH (ref 70–99)

## 2023-05-01 MED ORDER — FLUDEOXYGLUCOSE F - 18 (FDG) INJECTION
8.9400 | Freq: Once | INTRAVENOUS | Status: AC | PRN
Start: 2023-05-01 — End: 2023-05-01
  Administered 2023-05-01: 8.94 via INTRAVENOUS

## 2023-05-02 ENCOUNTER — Other Ambulatory Visit: Payer: Self-pay

## 2023-05-02 DIAGNOSIS — C159 Malignant neoplasm of esophagus, unspecified: Secondary | ICD-10-CM

## 2023-05-04 ENCOUNTER — Inpatient Hospital Stay (HOSPITAL_BASED_OUTPATIENT_CLINIC_OR_DEPARTMENT_OTHER): Payer: 59 | Admitting: Hematology

## 2023-05-04 ENCOUNTER — Other Ambulatory Visit: Payer: Self-pay

## 2023-05-04 ENCOUNTER — Telehealth: Payer: Self-pay | Admitting: Hematology

## 2023-05-04 ENCOUNTER — Inpatient Hospital Stay: Payer: 59 | Attending: Hematology

## 2023-05-04 ENCOUNTER — Encounter: Payer: Self-pay | Admitting: Hematology

## 2023-05-04 VITALS — BP 125/88 | HR 66 | Temp 98.0°F | Resp 17 | Wt 169.0 lb

## 2023-05-04 DIAGNOSIS — C155 Malignant neoplasm of lower third of esophagus: Secondary | ICD-10-CM | POA: Insufficient documentation

## 2023-05-04 DIAGNOSIS — C159 Malignant neoplasm of esophagus, unspecified: Secondary | ICD-10-CM

## 2023-05-04 DIAGNOSIS — Z931 Gastrostomy status: Secondary | ICD-10-CM | POA: Insufficient documentation

## 2023-05-04 DIAGNOSIS — L89159 Pressure ulcer of sacral region, unspecified stage: Secondary | ICD-10-CM | POA: Insufficient documentation

## 2023-05-04 DIAGNOSIS — K5900 Constipation, unspecified: Secondary | ICD-10-CM | POA: Insufficient documentation

## 2023-05-04 DIAGNOSIS — E43 Unspecified severe protein-calorie malnutrition: Secondary | ICD-10-CM | POA: Insufficient documentation

## 2023-05-04 DIAGNOSIS — Z7901 Long term (current) use of anticoagulants: Secondary | ICD-10-CM | POA: Diagnosis not present

## 2023-05-04 LAB — CBC WITH DIFFERENTIAL (CANCER CENTER ONLY)
Abs Immature Granulocytes: 0.14 10*3/uL — ABNORMAL HIGH (ref 0.00–0.07)
Basophils Absolute: 0.1 10*3/uL (ref 0.0–0.1)
Basophils Relative: 1 %
Eosinophils Absolute: 0.3 10*3/uL (ref 0.0–0.5)
Eosinophils Relative: 2 %
HCT: 49.3 % (ref 39.0–52.0)
Hemoglobin: 16.2 g/dL (ref 13.0–17.0)
Immature Granulocytes: 1 %
Lymphocytes Relative: 25 %
Lymphs Abs: 2.6 10*3/uL (ref 0.7–4.0)
MCH: 29.5 pg (ref 26.0–34.0)
MCHC: 32.9 g/dL (ref 30.0–36.0)
MCV: 89.6 fL (ref 80.0–100.0)
Monocytes Absolute: 1 10*3/uL (ref 0.1–1.0)
Monocytes Relative: 10 %
Neutro Abs: 6.3 10*3/uL (ref 1.7–7.7)
Neutrophils Relative %: 61 %
Platelet Count: 290 10*3/uL (ref 150–400)
RBC: 5.5 MIL/uL (ref 4.22–5.81)
RDW: 15.4 % (ref 11.5–15.5)
WBC Count: 10.4 10*3/uL (ref 4.0–10.5)
nRBC: 0 % (ref 0.0–0.2)

## 2023-05-04 LAB — CMP (CANCER CENTER ONLY)
ALT: 35 U/L (ref 0–44)
AST: 28 U/L (ref 15–41)
Albumin: 3.6 g/dL (ref 3.5–5.0)
Alkaline Phosphatase: 100 U/L (ref 38–126)
Anion gap: 6 (ref 5–15)
BUN: 17 mg/dL (ref 8–23)
CO2: 31 mmol/L (ref 22–32)
Calcium: 9.5 mg/dL (ref 8.9–10.3)
Chloride: 102 mmol/L (ref 98–111)
Creatinine: 0.74 mg/dL (ref 0.61–1.24)
GFR, Estimated: >60 mL/min (ref >60)
Glucose, Bld: 122 mg/dL — ABNORMAL HIGH (ref 70–99)
Potassium: 4.6 mmol/L (ref 3.5–5.1)
Sodium: 139 mmol/L (ref 135–145)
Total Bilirubin: 0.6 mg/dL (ref 0.3–1.2)
Total Protein: 6.9 g/dL (ref 6.5–8.1)

## 2023-05-04 MED ORDER — ONDANSETRON 4 MG PO TBDP: 4.0000 mg | ORAL_TABLET | Freq: Three times a day (TID) | ORAL | 0 refills | Status: DC | PRN

## 2023-05-04 NOTE — Telephone Encounter (Signed)
Patient is aware of scheduled appointment times/dates

## 2023-05-04 NOTE — Progress Notes (Signed)
START ON PATHWAY REGIMEN - Gastroesophageal     A cycle is every 7 days, concurrent with RT:     Paclitaxel      Carboplatin   **Always confirm dose/schedule in your pharmacy ordering system**  Patient Characteristics: Esophageal & GE Junction, Adenocarcinoma, Preoperative or Nonsurgical Candidate, M0 (Clinical Staging), cT2 or Higher or cN+, Surgical Candidate (Up to cT4a), Esophageal Therapeutic Status: Preoperative or Nonsurgical Candidate, M0 (Clinical Staging) Histology: Adenocarcinoma Disease Classification: Esophageal AJCC Grade: G2 AJCC 8 Stage Grouping: III AJCC T Category: cT3 AJCC N Category: cN0 AJCC M Category: cM0 Intent of Therapy: Curative Intent, Discussed with Patient

## 2023-05-04 NOTE — Assessment & Plan Note (Signed)
-  cTxN0M0 -Patient presented to hospital with progressive dysphagia and weight loss. -EGD showed a malignant appearing severe stenosis 30 to 34 cm from incisors.  The stenosis measured 4 cm, the scope was not able to pass.  Biopsy confirmed adenocarcinoma -He underwent PEG feeding tube for nutrition -PET scan showed hypermetabolic lesion in the distal esophagus, no nodal or distant metastasis.   -Plan to offer concurrent chemoradiation with weekly carbo platinum and Taxol.

## 2023-05-04 NOTE — Progress Notes (Signed)
[facility]   [phone] Fax:(336) [phone]   Clinic Follow up Note   Patient Care Team: [name], MD as PCP - General (Family Medicine) [name], MD as PCP - Cardiology (Cardiology) [name], MD as Consulting Physician (Hematology and Oncology)  Date of Service:  05/04/2023  CHIEF COMPLAINT: f/u of esophageal cancer  CURRENT THERAPY:  Pending concurrent chemoradiation  Oncology History   Malignant neoplasm of esophagus (HCC) -cTxN0M0 -Patient presented to hospital with progressive dysphagia and weight loss. -EGD showed a malignant appearing severe stenosis 30 to 34 cm from incisors.  The stenosis measured 4 cm, the scope was not able to pass.  Biopsy confirmed adenocarcinoma -He underwent PEG feeding tube for nutrition -PET scan showed hypermetabolic lesion in the distal esophagus, no nodal or distant metastasis.   -Plan to offer concurrent chemoradiation with weekly carbo platinum and Taxol.   Assessment and Plan    Esophageal Cancer Newly diagnosed, localized disease with no evidence of metastasis on PET scan. Discussed the aggressive nature of the disease and the standard treatment plan of chemo-radiation followed by surgery. Noted patient's current physical condition and the need to gain weight and strength to tolerate treatment. -Schedule chemo-radiation to start week of November 11 or 18, 2024. -Refer to thoracic surgeon for consultation. -Schedule endoscopic ultrasound for accurate staging if possible, but may not be feasible due to the severe stricture. -Encourage weight gain and physical activity.  Severe protein and calorie malnutrition Patient has a PEG tube in place and is managing well with feeding. -Continue use of PEG tube for nutrition during treatment. -Advise against getting the PEG tube wet (no showers, only sponge baths).  Pressure ulcer  Noted redness at the sacrum. Patient is managing with Desitin and  repositioning. -Continue Desitin application. -Encourage frequent repositioning and use of pillows for support. -Advise against prolonged sitting and encourage movement every couple of hours.  Plan -PET scan reviewed -Patient scheduled to see radiation oncologist Dr. Mitzi Hansen next week -Will refer him to cardiothoracic surgeon Dr. Cliffton Asters to discuss esophagectomy -Will reach out to his GI to see if EUS is a feasible -Plan to start concurrent chemoradiation on November 18      SUMMARY OF ONCOLOGIC HISTORY: Oncology History  Malignant neoplasm of esophagus (HCC)  04/18/2023 Cancer Staging   Staging form: Esophagus - Adenocarcinoma, AJCC 8th Edition - Clinical stage from 04/18/2023: Stage Unknown (cTX, cN0, cM0) - Signed by [name], MD on 05/04/2023 Total positive nodes: 0   04/20/2023 Initial Diagnosis   Malignant neoplasm of lower third of esophagus (HCC)   05/22/2023 -  Chemotherapy   Patient is on Treatment Plan : ESOPHAGUS Carboplatin + Paclitaxel Weekly X 6 Weeks with XRT        Discussed the use of AI scribe software for clinical note transcription with the patient, who gave verbal consent to proceed.  History of Present Illness   A 77 year old individual with a recent diagnosis of esophageal cancer presents for a follow-up appointment after hospital discharge. He reports managing his symptoms with a feeding tube, taking Eliquis twice daily, and occasionally using Senna for constipation. He has been able to maintain his weight since hospital discharge and has been active, doing his own laundry and walking around his yard for exercise. He has a hospital bed at home and reports getting good rest. He has been experiencing some discomfort due to excessive saliva and mucus, which he manages by regurgitating. He also reports some sensitivity around  the feeding tube site but denies any pain.         All other systems were reviewed with the patient and are negative.  MEDICAL  HISTORY:  Past Medical History:  Diagnosis Date   Diabetes mellitus without complication [facility])     SURGICAL HISTORY: Past Surgical History:  Procedure Laterality Date   BIOPSY  04/18/2023   Procedure: BIOPSY;  Surgeon: Lynann Bologna, DO;  Location: [facility] ENDOSCOPY;  Service: Gastroenterology;;   ESOPHAGOGASTRODUODENOSCOPY N/A 04/18/2023   Procedure: ESOPHAGOGASTRODUODENOSCOPY (EGD);  Surgeon: Lynann Bologna, DO;  Location[facility] ENDOSCOPY;  Service: Gastroenterology;  Laterality: N/A;   IR GASTROSTOMY TUBE MOD SED  04/20/2023   IR NASO G TUBE PLC W/FL W/RAD  04/20/2023   KNEE ARTHROPLASTY Bilateral     I have reviewed the social history and family history with the patient and they are unchanged from previous note.  ALLERGIES:  is allergic to penicillins, sulfa antibiotics, and procaine.  MEDICATIONS:  Current Outpatient Medications  Medication Sig Dispense Refill   apixaban (ELIQUIS) 5 MG TABS tablet Place 1 tablet (5 mg total) into feeding tube 2 (two) times daily. 60 tablet 0   Blood Glucose Monitoring Suppl (ACCU-CHEK GUIDE ME) w/Device KIT USE AS DIRECTED 3 TIMES A DAY 1 kit 0   glucose blood (ACCU-CHEK GUIDE) test strip Use as instructed daily 100 each 12   Multiple Vitamins-Minerals (MULTIVITAMIN MEN) TABS Take 1 tablet by mouth daily with breakfast.     Nutritional Supplements (FEEDING SUPPLEMENT, OSMOLITE 1.5 CAL,) LIQD Place 237 mLs into feeding tube 6 (six) times daily. 42660 mL 0   Water For Irrigation, Sterile (FREE WATER) SOLN Place 120 mLs into feeding tube 6 (six) times daily. 24000 mL 0   No current facility-administered medications for this visit.    PHYSICAL EXAMINATION: ECOG PERFORMANCE STATUS: 2 - Symptomatic, <50% confined to bed  Vitals:   05/04/23 1257  BP: 125/88  Pulse: 66  Resp: 17  Temp: 98 F (36.7 C)  SpO2: 97%   Wt Readings from Last 3 Encounters:  05/04/23 169 lb (76.7 kg)  04/25/23 166 lb 7.2 oz (75.5 kg)  12/15/22 204 lb (92.5 kg)      GENERAL:alert, no distress and comfortable SKIN: skin color, texture, turgor are normal, no rashes or significant lesions EYES: normal, Conjunctiva are pink and non-injected, sclera clear NECK: supple, thyroid normal size, non-tender, without nodularity LYMPH:  no palpable lymphadenopathy in the cervical, axillary  LUNGS: clear to auscultation and percussion with normal breathing effort HEART: regular rate & rhythm and no murmurs and no lower extremity edema ABDOMEN:abdomen soft, non-tender and normal bowel sounds Musculoskeletal:no cyanosis of digits and no clubbing  NEURO: alert & oriented x 3 with fluent speech, no focal motor/sensory deficits    LABORATORY DATA:  I have reviewed the data as listed    Latest Ref Rng & Units 05/04/2023   12:10 PM 04/25/2023    5:07 AM 04/22/2023    8:05 AM  CBC  WBC 4.0 - 10.5 K/uL 10.4  10.3  12.3   Hemoglobin 13.0 - 17.0 g/dL 02.5  42.7  06.2   Hematocrit 39.0 - 52.0 % 49.3  46.9  51.2   Platelets 150 - 400 K/uL 290  206  164         Latest Ref Rng & Units 05/04/2023   12:10 PM 04/20/2023    3:15 AM 04/19/2023    8:25 AM  CMP  Glucose 70 - 99 mg/dL 376  83  101   BUN 8 - 23 mg/dL 17  8  10    Creatinine 0.61 - 1.24 mg/dL 1.61  0.96  0.45   Sodium 135 - 145 mmol/L 139  144  142   Potassium 3.5 - 5.1 mmol/L 4.6  3.5  3.4   Chloride 98 - 111 mmol/L 102  108  111   CO2 22 - 32 mmol/L 31  23  23    Calcium 8.9 - 10.3 mg/dL 9.5  9.1  8.2   Total Protein 6.5 - 8.1 g/dL 6.9  4.9  5.3   Total Bilirubin 0.3 - 1.2 mg/dL 0.6  1.2  1.4   Alkaline Phos 38 - 126 U/L 100  46  48   AST 15 - 41 U/L 28  37  40   ALT 0 - 44 U/L 35  41  46       RADIOGRAPHIC STUDIES: I have personally reviewed the radiological images as listed and agreed with the findings in the report. No results found.    Orders Placed This Encounter  Procedures   CBC with Differential (Cancer Center Only)    Standing Status:   Future    Standing Expiration Date:    05/21/2024   CMP (Cancer Center only)    Standing Status:   Future    Standing Expiration Date:   05/21/2024   CBC with Differential (Cancer Center Only)    Standing Status:   Future    Standing Expiration Date:   05/28/2024   CMP (Cancer Center only)    Standing Status:   Future    Standing Expiration Date:   05/28/2024   CBC with Differential (Cancer Center Only)    Standing Status:   Future    Standing Expiration Date:   06/04/2024   CMP (Cancer Center only)    Standing Status:   Future    Standing Expiration Date:   06/04/2024   CBC with Differential (Cancer Center Only)    Standing Status:   Future    Standing Expiration Date:   06/11/2024   CMP (Cancer Center only)    Standing Status:   Future    Standing Expiration Date:   06/11/2024   CBC with Differential (Cancer Center Only)    Standing Status:   Future    Standing Expiration Date:   06/18/2024   CMP (Cancer Center only)    Standing Status:   Future    Standing Expiration Date:   06/18/2024   CBC with Differential (Cancer Center Only)    Standing Status:   Future    Standing Expiration Date:   06/25/2024   CMP (Cancer Center only)    Standing Status:   Future    Standing Expiration Date:   06/25/2024   Ambulatory referral to Cardiothoracic Surgery    Referral Priority:   Urgent    Referral Type:   Surgical    Referral Reason:   Specialty Services Required    Requested Specialty:   Cardiothoracic Surgery    Number of Visits Requested:   1   Ambulatory Referral to [facility] Nutrition    Referral Priority:   Urgent    Referral Type:   Consultation    Referral Reason:   Specialty Services Required    Number of Visits Requested:   1   Ambulatory referral to Social Work    Referral Priority:   Urgent    Referral Type:   Consultation    Referral Reason:  Specialty Services Required    Number of Visits Requested:   1   All questions were answered. The patient knows to call the clinic with any problems, questions or  concerns. No barriers to learning was detected. The total time spent in the appointment was 45 minutes.     [name], MD 05/04/2023

## 2023-05-05 ENCOUNTER — Other Ambulatory Visit: Payer: Self-pay

## 2023-05-05 ENCOUNTER — Telehealth: Payer: Self-pay

## 2023-05-07 NOTE — Patient Outreach (Signed)
  Care Management  Transitions of Care Program Transitions of Care Post-discharge week 2   05/07/2023 Name: Samuel [name] MRN: [id number] DOB: 05-24-1946  Subjective: Samuel [name] is a 77 y.o. year old male who is a primary care patient of [name], MD. The Care Management team Engaged with patient Engaged with patient by telephone to assess and address transitions of care needs.   Consent to Services:  Patient was given information about care management services, agreed to services, and gave verbal consent to participate.   Assessment: see attached documentation under wrap up.      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   RNCM Clinical Goal(s):  Patient will work with the Care Management team over the next 30 days to address Transition of Care Barriers: Need for Oncology follow up on malignant-appearing esophageal stenosis/biopsy results with Dr. Mosetta Putt on 04/24/23; Adjusting to NPO status and need for Osmolite 1.5 tube feedings to address protein calorie malnutrition  through collaboration with RN Care manager, provider, and care team. 11/1 Update - Patient has had appt w/ Dr. Mosetta Putt and will be pursuing chemo/radiation as soon as possible, as well as planning for Esophagectomy once he is strong enough for surgery. Patient has an excellent mindset, positive attitude, and is pro-active in his personal healthcare management. In order to undergo an esophagectomy, however, patient needs to increase his protein intake(noted for having severe protein cal malnutrition) and increase his overall physical stamina/conditioning from current deconditioning status d/t recent hospitlaization  before undergoing surgery to remove part or all of the esophagus and then having the "esophagus" rebuilt from part of the stomach with the end result = removal of cancerous tissue and ability to eventually eat solid food again.   Interventions: Evaluation of current treatment plan related to  self management and patient's adherence to plan as established by provider   Oncology:  (Status: ONGOING) Short Term Goal Assessment of understanding of oncology diagnosis: 11/1 patient is highly intelligent and has an excellent understanding of his oncology diagnosis Assessed patient understanding of cancer diagnosis and recommended treatment plan Reviewed upcoming provider appointments and treatment appointments Assessed available transportation to appointments and treatments. Has consistent/reliable transportation: Yes Assessed support system. Has consistent/reliable family or other support: Yes PHQ2/PHQ9 performed Nutrition assessment performed  Patient Goals/Self-Care Activities: Participate in Transition of Care Program/Attend TOC scheduled calls Take all medications as prescribed Call pharmacy for medication refills 3-7 days in advance of running out of medications Call provider office for new concerns or questions   Follow Up Plan:  The patient has been provided with contact information for the care management team and has been advised to call with any health related questions or concerns.          Plan: The patient has been provided with contact information for the care management team and has been advised to call with any health related questions or concerns.   [name], RN, BA, [facility], CRRN [facility] Coordinator, Transition of Care Ph # [phone]

## 2023-05-07 NOTE — Patient Instructions (Signed)
Visit Information  Hi Roper,  Thank you for taking time to visit with me today. It was an Systems developer and a privilege to discuss your personal background and clearly your life-story has a direct bearing on how you are handling this challenging diagnosis of esophageal cancer. Good news is there is no evidence of metastasis. Thanks for sharing your plans for upcoming chemo and radiation which should begin soon.   I know you are also planning on an Esophagectomy with reconstructive surgery so that you can remove the cancerous tissue and be able to savor your food once more by mouth. During our next tele-appointment I really want to focus on how to increase your protein intake (I found your most recent labs taken in the hospital for Total Protein and Albumin as being significantly below normal). Also, we need to discuss plans to re-"condition" your physical status because it doesn't take long for the body to become de-conditioned when you are in the hospital for 5 days.  Please don't hesitate to contact me if I can be of assistance to you before our next scheduled telephone appointment.  Our next appointment is by telephone on November 8 at 10:30am. I really look forward to talking to you again!  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   RNCM Clinical Goal(s):  Patient will work with the Care Management team over the next 30 days to address Transition of Care Barriers: Need for Oncology follow up on malignant-appearing esophageal stenosis/biopsy results with Dr. Mosetta Putt on 04/24/23; Adjusting to NPO status and need for Osmolite  1.5 tube feedings to address protein calorie malnutrition  through collaboration with RN Care manager, provider, and care team. 11/1 Update - Patient has had appt w/ Dr. Mosetta Putt and will be pursuing chemo/radiation as soon as possible, as well as planning for Esophagectomy once he is strong enough for surgery. Patient has an excellent mindset, positive attitude, and is pro-active in his personal healthcare management. In order to undergo an esophagectomy, however, patient needs to increase his protein intake(noted for having severe protein cal malnutrition) and increase his overall physical stamina/conditioning from current deconditioning status d/t recent hospitlaization before undergoing surgery to remove part or all of the esophagus and then having the "esophagus" rebuilt from part of the stomach with the end result = removal of cancerous tissue and ability to eventually eat solid food again.   Interventions: Evaluation of current treatment plan related to  self management and patient's adherence to plan as established by provider   Oncology:  (Status: ONGOING) Short Term Goal Assessment of understanding of oncology diagnosis: 11/1 patient is highly intelligent and has an excellent understanding of his oncology diagnosis Assessed patient understanding of cancer diagnosis and recommended treatment plan Reviewed upcoming provider appointments and treatment appointments Assessed available transportation to appointments and treatments. Has consistent/reliable transportation: Yes Assessed support system. Has consistent/reliable family or other support: Yes PHQ2/PHQ9 performed Nutrition assessment performed  Patient Goals/Self-Care Activities: Participate in Transition of Care Program/Attend TOC scheduled calls Take all medications as prescribed Call pharmacy for medication refills  3-7 days in advance of running out of medications Call provider office for new concerns or questions   Follow Up Plan:   The patient has been provided with contact information for the care management team and has been advised to call with any health related questions or concerns.          Patient verbalizes understanding of instructions and care plan provided today and agrees to view in MyChart. Active MyChart status and patient understanding of how to access instructions and care plan via MyChart confirmed with patient.     The patient has been provided with contact information for the care management team and has been advised to call with any health related questions or concerns.   Please call the care guide team at [phone] if you need to cancel or reschedule your appointment.   Please call 1-800-273-TALK (toll free, 24 hour hotline) if you are experiencing a Mental Health or Behavioral Health Crisis or need someone to talk to.  [name], RN, BA, [facility], CRRN [facility] Coordinator, Transition of Care Ph # [phone]

## 2023-05-08 ENCOUNTER — Inpatient Hospital Stay: Payer: 59 | Admitting: Licensed Clinical Social Worker

## 2023-05-08 DIAGNOSIS — Z7901 Long term (current) use of anticoagulants: Secondary | ICD-10-CM | POA: Insufficient documentation

## 2023-05-08 DIAGNOSIS — C159 Malignant neoplasm of esophagus, unspecified: Secondary | ICD-10-CM

## 2023-05-08 DIAGNOSIS — E43 Unspecified severe protein-calorie malnutrition: Secondary | ICD-10-CM | POA: Insufficient documentation

## 2023-05-08 DIAGNOSIS — Z5111 Encounter for antineoplastic chemotherapy: Secondary | ICD-10-CM | POA: Insufficient documentation

## 2023-05-08 DIAGNOSIS — C155 Malignant neoplasm of lower third of esophagus: Secondary | ICD-10-CM | POA: Insufficient documentation

## 2023-05-08 DIAGNOSIS — Z931 Gastrostomy status: Secondary | ICD-10-CM | POA: Insufficient documentation

## 2023-05-08 NOTE — Progress Notes (Signed)
[facility] Clinical Social Work  Initial Assessment   Breccan Galant is a 77 y.o. year old male contacted by phone. Clinical Social Work was referred by medical provider for assessment of psychosocial needs.   SDOH (Social Determinants of Health) assessments performed: Yes   SDOH Screenings   Food Insecurity: No Food Insecurity (05/07/2023)  Housing: Low Risk  (05/05/2023)  Transportation Needs: No Transportation Needs (05/05/2023)  Utilities: Not At Risk (05/05/2023)  Alcohol Screen: Low Risk  (08/19/2022)  Depression (PHQ2-9): Low Risk  (12/15/2022)  Financial Resource Strain: Low Risk  (08/19/2022)  Physical Activity: Sufficiently Active (08/19/2022)  Social Connections: Moderately Integrated (08/19/2022)  Stress: No Stress Concern Present (08/19/2022)  Tobacco Use: Medium Risk (05/04/2023)  Health Literacy: Adequate Health Literacy (05/05/2023)     Distress Screen completed: No     No data to display            Family/Social Information:  Housing Arrangement: 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 Family members/support persons in your life? Friends- Pt states his wife is visiting from the [place] and is in the process of trying to obtain a Visa to remain in the country and has been a tremendous help, but she is does not speak a great deal of English.  Pt also reports having a number of friends who are willing to assist as needed.   Transportation concerns: yes, pt states he has not used Medicaid transportation and will need assistance setting it up as he does not have reliable transportation for treatment.  Employment: Retired .  Income source: Actor concerns: No Type of concern: None Food access concerns: no Religious or spiritual practice: Yes-Christian Services Currently in place:  none  Coping/ Adjustment to diagnosis: Patient understands treatment plan and what happens next? yes Concerns about diagnosis  and/or treatment: How will I care for myself and Quality of life Patient reported stressors: Transportation and Adjusting to my illness Hopes and/or priorities: Pt's priority is to start treatment w/ the hope of positive results Patient enjoys time with family/ friends Current coping skills/ strengths: Manufacturing systems engineer , Motivation for treatment/growth , and Supportive family/friends     SUMMARY: Current SDOH Barriers:  Transportation  Clinical Social Work Clinical Goal(s):  Explore community resource options for unmet needs related to:  Transportation  Interventions: Discussed common feeling and emotions when being diagnosed with cancer, and the importance of support during treatment Informed patient of the support team roles and support services at [facility] Provided CSW contact information and encouraged patient to call with any questions or concerns Referred patient to the transportation department to assist w/ setting up Medicaid transport.   Follow Up Plan: Patient will contact CSW with any support or resource needs Patient verbalizes understanding of plan: Yes    Rachel Moulds, LCSW Clinical Social Worker [facility]  Patient is participating in a Managed Medicaid Plan:  Yes

## 2023-05-09 ENCOUNTER — Ambulatory Visit
Admission: RE | Admit: 2023-05-09 | Discharge: 2023-05-09 | Disposition: A | Discharge status: Home / Self Care | Payer: 59 | Source: Ambulatory Visit | Attending: Radiation Oncology | Admitting: Radiation Oncology

## 2023-05-09 ENCOUNTER — Encounter: Payer: Self-pay | Admitting: Radiation Oncology

## 2023-05-09 ENCOUNTER — Inpatient Hospital Stay: Payer: 59 | Admitting: Nutrition

## 2023-05-09 VITALS — BP 127/68 | HR 75 | Temp 97.9°F | Resp 20 | Ht 66.0 in | Wt 171.0 lb

## 2023-05-09 DIAGNOSIS — Z7901 Long term (current) use of anticoagulants: Secondary | ICD-10-CM | POA: Diagnosis not present

## 2023-05-09 DIAGNOSIS — R911 Solitary pulmonary nodule: Secondary | ICD-10-CM | POA: Insufficient documentation

## 2023-05-09 DIAGNOSIS — C155 Malignant neoplasm of lower third of esophagus: Secondary | ICD-10-CM | POA: Diagnosis not present

## 2023-05-09 DIAGNOSIS — Z87891 Personal history of nicotine dependence: Secondary | ICD-10-CM | POA: Diagnosis not present

## 2023-05-09 DIAGNOSIS — K760 Fatty (change of) liver, not elsewhere classified: Secondary | ICD-10-CM | POA: Diagnosis not present

## 2023-05-09 DIAGNOSIS — I700 Atherosclerosis of aorta: Secondary | ICD-10-CM | POA: Diagnosis not present

## 2023-05-09 DIAGNOSIS — I082 Rheumatic disorders of both aortic and tricuspid valves: Secondary | ICD-10-CM | POA: Insufficient documentation

## 2023-05-09 DIAGNOSIS — K5730 Diverticulosis of large intestine without perforation or abscess without bleeding: Secondary | ICD-10-CM | POA: Insufficient documentation

## 2023-05-09 DIAGNOSIS — N400 Enlarged prostate without lower urinary tract symptoms: Secondary | ICD-10-CM | POA: Diagnosis not present

## 2023-05-09 DIAGNOSIS — I2699 Other pulmonary embolism without acute cor pulmonale: Secondary | ICD-10-CM | POA: Insufficient documentation

## 2023-05-09 DIAGNOSIS — N281 Cyst of kidney, acquired: Secondary | ICD-10-CM | POA: Insufficient documentation

## 2023-05-09 DIAGNOSIS — E119 Type 2 diabetes mellitus without complications: Secondary | ICD-10-CM | POA: Insufficient documentation

## 2023-05-09 DIAGNOSIS — K8020 Calculus of gallbladder without cholecystitis without obstruction: Secondary | ICD-10-CM | POA: Insufficient documentation

## 2023-05-09 NOTE — Progress Notes (Signed)
Radiation Oncology         [phone] ________________________________  Name: Samuel [name] MRN: [id number]  Date: 05/09/2023  DOB: 06-Apr-1946  GU:[id number], [name], MD  [name], MD     REFERRING PHYSICIAN: [name], MD   DIAGNOSIS: The encounter diagnosis was Malignant neoplasm of lower third of esophagus (HCC).   HISTORY OF PRESENT ILLNESS::Samuel [name] is a 77 y.o. male who is seen for an initial consultation visit regarding the patient's diagnosis of esophageal cancer.  The patient has describes some dysphagia and weight loss.  On EGD, a distal esophageal tumor was noted and biopsy confirmed adenocarcinoma.  Because of issues with dysphagia, a PEG tube has been placed and the patient states that this has gone well in terms of using this.  PET scan has been completed which showed hypermetabolic activity in the distal esophagus consistent with a primary tumor at this location.  No regional lymphadenopathy was seen or distant metastases.  The patient has seen medical oncology and a definitive course of chemoradiation treatment has been recommended for the patient.  He is also being referred to cardiothoracic surgery.  An endoscopic ultrasound is also being ordered but it is unclear that this will be able to be completed due to the stricture.  I have therefore been asked to see the patient for consideration of radiation treatment.    PREVIOUS RADIATION THERAPY: No   PAST MEDICAL HISTORY:  has a past medical history of Diabetes mellitus without complication (HCC).     PAST SURGICAL HISTORY: Past Surgical History:  Procedure Laterality Date   BIOPSY  04/18/2023   Procedure: BIOPSY;  Surgeon: Lynann Bologna, DO;  Location: [facility] ENDOSCOPY;  Service: Gastroenterology;;   ESOPHAGOGASTRODUODENOSCOPY N/A 04/18/2023   Procedure: ESOPHAGOGASTRODUODENOSCOPY (EGD);  Surgeon: Lynann Bologna, DO;  Location: [facility] ENDOSCOPY;  Service: Gastroenterology;  Laterality: N/A;   IR GASTROSTOMY TUBE MOD  SED  04/20/2023   IR NASO G TUBE PLC W/FL W/RAD  04/20/2023   KNEE ARTHROPLASTY Bilateral      FAMILY HISTORY: family history includes COPD in his father and sister; Diabetes in his maternal grandfather; Hypertension in his father.   SOCIAL HISTORY:  reports that he quit smoking about 37 years ago. His smoking use included cigarettes. He started smoking about 52 years ago. He has a 15 pack-year smoking history. He has never used smokeless tobacco. He reports current alcohol use of about 1.0 - 2.0 standard drink of alcohol per week. He reports that he does not use drugs.   ALLERGIES: Penicillins, Sulfa antibiotics, and Procaine   MEDICATIONS:  Current Outpatient Medications  Medication Sig Dispense Refill   apixaban (ELIQUIS) 5 MG TABS tablet Place 1 tablet (5 mg total) into feeding tube 2 (two) times daily. 60 tablet 0   Nutritional Supplements (FEEDING SUPPLEMENT, OSMOLITE 1.5 CAL,) LIQD Place 237 mLs into feeding tube 6 (six) times daily. 42660 mL 0   ondansetron (ZOFRAN-ODT) 4 MG disintegrating tablet Take 1 tablet (4 mg total) by mouth every 8 (eight) hours as needed for nausea or vomiting. 20 tablet 0   Water For Irrigation, Sterile (FREE WATER) SOLN Place 120 mLs into feeding tube 6 (six) times daily. 24000 mL 0   Blood Glucose Monitoring Suppl (ACCU-CHEK GUIDE ME) w/Device KIT USE AS DIRECTED 3 TIMES A DAY (Patient not taking: Reported on 05/09/2023) 1 kit 0   glucose blood (ACCU-CHEK GUIDE) test strip Use as instructed daily (Patient not taking: Reported on 05/09/2023) 100 each 12  Multiple Vitamins-Minerals (MULTIVITAMIN MEN) TABS Take 1 tablet by mouth daily with breakfast. (Patient not taking: Reported on 05/09/2023)     No current facility-administered medications for this encounter.     REVIEW OF SYSTEMS:  A 15 point review of systems is documented in the electronic medical record. This was obtained by the nursing staff. However, I reviewed this with the patient to discuss  relevant findings and make appropriate changes.  Pertinent items are noted in HPI.    PHYSICAL EXAM:  height is 5\' 6"  (1.676 m) and weight is 171 lb (77.6 kg). His temperature is 97.9 F (36.6 C). His blood pressure is 127/68 and his pulse is 75. His respiration is 20 and oxygen saturation is 96%.   ECOG = 1  0 - Asymptomatic (Fully active, able to carry on all predisease activities without restriction)  1 - Symptomatic but completely ambulatory (Restricted in physically strenuous activity but ambulatory and able to carry out work of a light or sedentary nature. For example, light housework, office work)  2 - Symptomatic, <50% in bed during the day (Ambulatory and capable of all self care but unable to carry out any work activities. Up and about more than 50% of waking hours)  3 - Symptomatic, >50% in bed, but not bedbound (Capable of only limited self-care, confined to bed or chair 50% or more of waking hours)  4 - Bedbound (Completely disabled. Cannot carry on any self-care. Totally confined to bed or chair)  5 - Death   [name] MM, Creech RH, Tormey DC, et al. [phone]). "Toxicity and response criteria of the [facility]". Am. Evlyn Clines. Oncol. 5 (6): 649-55  General: Well-developed, in no acute distress HEENT: Normocephalic, atraumatic Extremities: No edema present Neuro: No focal deficits    LABORATORY DATA:  Lab Results  Component Value Date   WBC 10.4 05/04/2023   HGB 16.2 05/04/2023   HCT 49.3 05/04/2023   MCV 89.6 05/04/2023   PLT 290 05/04/2023   Lab Results  Component Value Date   NA 139 05/04/2023   K 4.6 05/04/2023   CL 102 05/04/2023   CO2 31 05/04/2023   Lab Results  Component Value Date   ALT 35 05/04/2023   AST 28 05/04/2023   ALKPHOS 100 05/04/2023   BILITOT 0.6 05/04/2023      RADIOGRAPHY: NM PET Image Initial (PI) Skull Base To Thigh  Result Date: 05/02/2023 CLINICAL DATA:  Initial treatment strategy for esophageal cancer.  EXAM: NUCLEAR MEDICINE PET SKULL BASE TO THIGH TECHNIQUE: 8.94 mCi F-18 FDG was injected intravenously. Full-ring PET imaging was performed from the skull base to thigh after the radiotracer. CT data was obtained and used for attenuation correction and anatomic localization. Fasting blood glucose: 123. mg/dl COMPARISON:  CT of the neck and chest 04/14/2023. Abdominopelvic CT 04/19/2023. FINDINGS: Mediastinal blood pool activity: SUV max 2.5 NECK: No hypermetabolic cervical lymph nodes are identified. No suspicious activity identified within the pharyngeal mucosal space. Incidental CT findings: none CHEST: There is long segment hypermetabolic activity within the distal thoracic esophagus, extending approximately 7.3 cm in length and demonstrating an SUV max of 8.9. Upstream esophageal dilatation with layering debris, as before. There are no enlarged or significant hypermetabolic mediastinal, hilar or axillary lymph nodes. 5 mm high right paratracheal node on image 43/6 demonstrates low-level metabolic activity (SUV max 2.3). No hypermetabolic pulmonary activity or suspicious nodularity. Incidental CT findings: Interval volume loss in the left lower lobe with paramediastinal opacity and low  level metabolic activity, possibly reflecting atelectasis, although suspicious for aspiration. Mild aortic and great vessel atherosclerosis. ABDOMEN/PELVIS: There is no hypermetabolic activity within the liver, adrenal glands, spleen or pancreas. There is no hypermetabolic nodal activity in the abdomen or pelvis. Incidental CT findings: Interval percutaneous G-tube placement with low level activity surrounding the skin entrance site. Small gallstones. Stable cystic appearing lesion within the pancreatic head, measuring 1.9 cm on image 88/6, without hypermetabolic activity. Stable loculated left renal cysts measuring up to 8.3 cm on image 93/6, without hypermetabolic activity; no specific follow-up imaging recommended. Mild distal  colonic diverticulosis without evidence of acute inflammation. Moderate prostatomegaly. SKELETON: There is no hypermetabolic activity to suggest osseous metastatic disease. Incidental CT findings: Mild spondylosis. Previously noted sclerotic lesions in the T2-L5 vertebral bodies demonstrate no associated hypermetabolic activity and are likely benign incidental findings. IMPRESSION: 1. Long segment hypermetabolic activity within the distal thoracic esophagus, consistent with known esophageal cancer. 2. No evidence of metastatic disease. A small high right paratracheal lymph node demonstrates low-level metabolic activity and is nonspecific, possibly reactive. 3. Interval volume loss in the left lower lobe with paramediastinal opacity and low level metabolic activity, which could reflect atelectasis or aspiration. 4. Stable cystic appearing-lesion within the pancreatic head, without hypermetabolic activity, likely a side branch IPMN. Given the patient's history, suggest attention on follow-up imaging for the patient's esophageal cancer. 5. Interval percutaneous G-tube placement with low level activity surrounding the skin entrance site. 6.  Aortic Atherosclerosis (ICD10-I70.0). Electronically Signed   By: Carey Bullocks M.D.   On: 05/02/2023 17:37   IR GASTROSTOMY TUBE MOD SED  Result Date: 04/21/2023 INDICATION: Dysphagia.  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 EXAM: Procedures: 1. FLUOROSCOPIC-GUIDED NASOGASTRIC TUBE PLACEMENT 2. PERCUTANEOUS GASTROSTOMY TUBE PLACEMENT COMPARISON:  FL esophagram, 04/17/2023.  CT AP, 04/19/2023. MEDICATIONS: Vancomycin 1 gm IV; Antibiotics were administered within 1 hour of the procedure. CONTRAST:  15 mL of Isovue 300 administered into the gastric lumen. 0.5 mg glucagon IV ANESTHESIA/SEDATION: Moderate (conscious) sedation was employed during this procedure. A total of Versed 1 mg and Fentanyl 50 mcg was administered intravenously. Moderate Sedation Time: 13  minutes. The patient's level of consciousness and vital signs were monitored continuously by radiology nursing throughout the procedure under my direct supervision. FLUOROSCOPY TIME:  Fluoroscopic dose; 7 mGy COMPLICATIONS: None immediate. PROCEDURE: Informed written consent was obtained from the patient and/or patient's representative following explanation of the procedure, risks, benefits and alternatives. A time out was performed prior to the initiation of the procedure. Maximal barrier sterile technique utilized including caps, mask, sterile gowns, sterile gloves, large sterile drape, hand hygiene and sterile prep. The LEFT upper quadrant was sterilely prepped and draped. A 5 Fr Kumpe as a oral gastric catheter was inserted into the stomach under fluoroscopy. The LEFT costal margin and barium opacified transverse colon were identified and avoided. Air was injected into the stomach for insufflation and visualization under fluoroscopy. Under sterile conditions and local anesthesia, 2T tacks were utilized to pexy the anterior aspect of the stomach against the ventral abdominal wall. Contrast injection confirmed appropriate positioning of each of the T tacks. An incision was made between the T tacks and a 17 gauge trocar needle was utilized to access the stomach. Needle position was confirmed within the stomach with aspiration of air and injection of a small amount of contrast. A guidewire was advanced into the gastric lumen and under intermittent fluoroscopic guidance, the access needle was exchanged for a telescoping peel-away sheath, ultimately allowing  placement of a 18 Fr balloon retention gastrostomy tube. The retention balloon was insufflated with a mixture of dilute saline and contrast and pulled taut against the anterior wall of the stomach. The external disc was cinched. Contrast injection confirms positioning within the stomach. Several spot radiographic images were obtained in various obliquities for  documentation. The patient tolerated procedure well without immediate post procedural complication. FINDINGS: After successful fluoroscopic guided placement, the gastrostomy tube is appropriately positioned with internal retention balloon against the ventral aspect of the gastric lumen. IMPRESSION: Successful percutaneous insertion of an 18 Fr balloon retention gastrostomy tube. The gastrostomy may be used immediately for medication administration and in 4 hrs for the initiation of feeds. RECOMMENDATIONS: The patient will return to Vascular Interventional Radiology (VIR) for routine feeding tube evaluation and exchange in 6 months. Roanna Banning, MD [facility] Radiology Electronically Signed   By: Roanna Banning M.D.   On: 04/21/2023 09:05   IR Naso G Tube Plc W/FL W/Rad  Result Date: 04/21/2023 INDICATION: Dysphagia.  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 EXAM: Procedures: 1. FLUOROSCOPIC-GUIDED NASOGASTRIC TUBE PLACEMENT 2. PERCUTANEOUS GASTROSTOMY TUBE PLACEMENT COMPARISON:  FL esophagram, 04/17/2023.  CT AP, 04/19/2023. MEDICATIONS: Vancomycin 1 gm IV; Antibiotics were administered within 1 hour of the procedure. CONTRAST:  15 mL of Isovue 300 administered into the gastric lumen. 0.5 mg glucagon IV ANESTHESIA/SEDATION: Moderate (conscious) sedation was employed during this procedure. A total of Versed 1 mg and Fentanyl 50 mcg was administered intravenously. Moderate Sedation Time: 13 minutes. The patient's level of consciousness and vital signs were monitored continuously by radiology nursing throughout the procedure under my direct supervision. FLUOROSCOPY TIME:  Fluoroscopic dose; 7 mGy COMPLICATIONS: None immediate. PROCEDURE: Informed written consent was obtained from the patient and/or patient's representative following explanation of the procedure, risks, benefits and alternatives. A time out was performed prior to the initiation of the  procedure. Maximal barrier sterile technique utilized including caps, mask, sterile gowns, sterile gloves, large sterile drape, hand hygiene and sterile prep. The LEFT upper quadrant was sterilely prepped and draped. A 5 Fr Kumpe as a oral gastric catheter was inserted into the stomach under fluoroscopy. The LEFT costal margin and barium opacified transverse colon were identified and avoided. Air was injected into the stomach for insufflation and visualization under fluoroscopy. Under sterile conditions and local anesthesia, 2T tacks were utilized to pexy the anterior aspect of the stomach against the ventral abdominal wall. Contrast injection confirmed appropriate positioning of each of the T tacks. An incision was made between the T tacks and a 17 gauge trocar needle was utilized to access the stomach. Needle position was confirmed within the stomach with aspiration of air and injection of a small amount of contrast. A guidewire was advanced into the gastric lumen and under intermittent fluoroscopic guidance, the access needle was exchanged for a telescoping peel-away sheath, ultimately allowing placement of a 18 Fr balloon retention gastrostomy tube. The retention balloon was insufflated with a mixture of dilute saline and contrast and pulled taut against the anterior wall of the stomach. The external disc was cinched. Contrast injection confirms positioning within the stomach. Several spot radiographic images were obtained in various obliquities for documentation. The patient tolerated procedure well without immediate post procedural complication. FINDINGS: After successful fluoroscopic guided placement, the gastrostomy tube is appropriately positioned with internal retention balloon against the ventral aspect of the gastric lumen. IMPRESSION: Successful percutaneous insertion of an 18 Fr balloon retention gastrostomy tube. The gastrostomy  may be used immediately for medication administration and in 4 hrs for  the initiation of feeds. RECOMMENDATIONS: The patient will return to Vascular Interventional Radiology (VIR) for routine feeding tube evaluation and exchange in 6 months. Roanna Banning, MD [facility] Radiology Electronically Signed   By: Roanna Banning M.D.   On: 04/21/2023 09:05   CT ABDOMEN PELVIS W CONTRAST  Result Date: 04/19/2023 CLINICAL DATA:  Colon cancer metastatic disease. EXAM: CT ABDOMEN AND PELVIS WITH CONTRAST TECHNIQUE: Multidetector CT imaging of the abdomen and pelvis was performed using the standard protocol following bolus administration of intravenous contrast. RADIATION DOSE REDUCTION: This exam was performed according to the departmental dose-optimization program which includes automated exposure control, adjustment of the mA and/or kV according to patient size and/or use of iterative reconstruction technique. CONTRAST:  75mL OMNIPAQUE IOHEXOL 350 MG/ML SOLN COMPARISON:  None. FINDINGS: Lower chest: There are trace bilateral pleural effusions with bibasilar atelectasis. Hepatobiliary: Small gallstone is present. There is no biliary ductal dilatation. There is fatty infiltration of the liver. No focal liver lesions are seen. Pancreas: There is a low-attenuation/cystic area in the pancreatic head measuring 1.7 x 1.2 cm. There is mild diffuse pancreatic atrophy. There is no pancreatic ductal dilatation or surrounding inflammation. Spleen: Normal in size without focal abnormality. Adrenals/Urinary Tract: There is a lobulated left renal cyst measuring 8.1 x 6.1 cm. Otherwise, the kidneys, adrenal glands and bladder are within normal limits. Stomach/Bowel: Stomach is within normal limits. Appendix appears normal. No evidence of bowel wall thickening, distention, or inflammatory changes. Focal colonic mass not visualized on this study. There is diffuse colonic diverticulosis. There is wall thickening of the distal esophagus with mild surrounding  inflammation. Vascular/Lymphatic: Aortic atherosclerosis. No enlarged abdominal or pelvic lymph nodes. Reproductive: Prostate gland is enlarged. Other: No abdominal wall hernia or abnormality. No abdominopelvic ascites. Musculoskeletal: There is a subtle sclerotic focus within the L5 vertebral body which is rounded measuring proximally 2 cm. No acute fractures are seen. IMPRESSION: 1. No evidence for metastatic disease in the abdomen or pelvis. 2. Wall thickening of the distal esophagus with mild surrounding inflammation worrisome for esophagitis. 3. Trace bilateral pleural effusions with bibasilar atelectasis. 4. Cholelithiasis. 5. Colonic diverticulosis. 6. Indeterminate sclerotic focus in the L5 vertebral body. Metastatic disease not excluded. 7. 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 8. Left Bosniak I benign renal cyst measuring 8.1 cm. No follow-up imaging is recommended. JACR 2018 Feb; 264-273, Management of the Incidental Renal Mass on CT, Radio[employer] 2021; [phone], Bosniak Classification of Cystic Renal Masses, Version 2019. Aortic Atherosclerosis (ICD10-I70.0). Electronically Signed   By: Darliss Cheney M.D.   On: 04/19/2023 20:18   DG ESOPHAGUS W SINGLE CM (SOL OR THIN BA)  Result Date: 04/17/2023 CLINICAL DATA:  77 year old male with trouble swallowing both liquid and solid food. EXAM: ESOPHAGUS/BARIUM SWALLOW/TABLET STUDY TECHNIQUE: Single contrast examination was performed using thin liquid barium. This exam was performed by Loyce Dys PA-C, and was supervised and interpreted by Roanna Banning, MD. FLUOROSCOPY: Radiation Exposure Index (as provided by the fluoroscopic device): 13 mGy Kerma COMPARISON:  CT CHEST 04/14/2023. FINDINGS: Swallowing: Appears normal. No vestibular penetration or aspiration seen. Pharynx:  No masses, lesions or ulcers observed. Esophagus: Abrupt tapering in the thoracic esophagus with only a trickle of barium observed to  intermittently pass into the stomach. Unable to assess for motility, hiatal hernia, and gastroesophageal reflux. 13mm barium tablet was not given.  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 Correlate with direct endoscopic visualization. Electronically Signed   By: Roanna Banning M.D.   On: 04/17/2023 10:24   ECHOCARDIOGRAM COMPLETE  Result Date: 04/15/2023    ECHOCARDIOGRAM REPORT   Patient Name:   NEMIAH KISSNER Date of Exam: 04/15/2023 Medical Rec #:  [id number]   Height:       66.0 in Accession #:    [id number]  Weight:       203.9 lb Date of Birth:  April 19, 1946   BSA:          2.016 m Patient Age:    77 years    BP:           127/67 mmHg Patient Gender: M           HR:           61 bpm. Exam Location:  Inpatient Procedure: 2D Echo, Cardiac Doppler and Color Doppler Indications:    Pulmonary embolus  History:        Patient has no prior history of Echocardiogram examinations.                 Arrythmias:Bradycardia; Risk Factors:Diabetes and Former Smoker.  Sonographer:    Milda Smart Referring Phys: [id number] [facility] GOEL  Sonographer Comments: Image acquisition challenging due to respiratory motion. IMPRESSIONS  1. Left ventricular ejection fraction, by estimation, is 55%. The left ventricle has normal function. The left ventricle has no regional wall motion abnormalities. Left ventricular diastolic parameters are consistent with Grade I diastolic dysfunction (impaired relaxation).  2. Right ventricular systolic function is normal. The right ventricular size is normal. Tricuspid regurgitation signal is inadequate for assessing PA pressure.  3. The mitral valve is normal in structure. Trivial mitral valve regurgitation. No evidence of mitral stenosis.  4. The aortic valve is grossly normal. Aortic valve regurgitation is mild to moderate. Aortic valve sclerosis is present, with no evidence of aortic valve stenosis.  5. The  inferior vena cava is normal in size with greater than 50% respiratory variability, suggesting right atrial pressure of 3 mmHg. FINDINGS  Left Ventricle: Left ventricular ejection fraction, by estimation, is 55%. The left ventricle has normal function. The left ventricle has no regional wall motion abnormalities. The left ventricular internal cavity size was normal in size. There is no left ventricular hypertrophy. Left ventricular diastolic parameters are consistent with Grade I diastolic dysfunction (impaired relaxation). Right Ventricle: The right ventricular size is normal. No increase in right ventricular wall thickness. Right ventricular systolic function is normal. Tricuspid regurgitation signal is inadequate for assessing PA pressure. The tricuspid regurgitant velocity is 1.34 m/s, and with an assumed right atrial pressure of 3 mmHg, the estimated right ventricular systolic pressure is 10.2 mmHg. Left Atrium: Left atrial size was normal in size. Right Atrium: Right atrial size was normal in size. Pericardium: There is no evidence of pericardial effusion. Mitral Valve: The mitral valve is normal in structure. Trivial mitral valve regurgitation. No evidence of mitral valve stenosis. Tricuspid Valve: The tricuspid valve is normal in structure. Tricuspid valve regurgitation is trivial. No evidence of tricuspid stenosis. Aortic Valve: The aortic valve is grossly normal. Aortic valve regurgitation is mild to moderate. Aortic regurgitation PHT measures 842 msec. Aortic valve sclerosis is present, with no evidence of aortic valve stenosis. Pulmonic Valve: The pulmonic valve was not well visualized. Pulmonic valve regurgitation is not visualized. No evidence of pulmonic stenosis. Aorta:  The aortic root is normal in size and structure. Ascending aorta measurements are within normal limits for age when indexed to body surface area. Venous: The inferior vena cava is normal in size with greater than 50% respiratory  variability, suggesting right atrial pressure of 3 mmHg. IAS/Shunts: No atrial level shunt detected by color flow Doppler.  LEFT VENTRICLE PLAX 2D LVIDd:         5.40 cm      Diastology LVIDs:         4.10 cm      LV e' medial:    7.07 cm/s LV PW:         0.70 cm      LV E/e' medial:  7.1 LV IVS:        0.70 cm      LV e' lateral:   7.40 cm/s LVOT diam:     2.50 cm      LV E/e' lateral: 6.8 LV SV:         127 LV SV Index:   63 LVOT Area:     4.91 cm  LV Volumes (MOD) LV vol d, MOD A2C: 98.7 ml LV vol d, MOD A4C: 100.0 ml LV vol s, MOD A2C: 49.4 ml LV vol s, MOD A4C: 43.6 ml LV SV MOD A2C:     49.3 ml LV SV MOD A4C:     100.0 ml LV SV MOD BP:      54.7 ml RIGHT VENTRICLE             IVC RV Basal diam:  2.70 cm     IVC diam: 1.40 cm RV S prime:     20.00 cm/s TAPSE (M-mode): 2.2 cm LEFT ATRIUM             Index        RIGHT ATRIUM          Index LA diam:        2.90 cm 1.44 cm/m   RA Area:     7.43 cm LA Vol (A2C):   34.7 ml 17.21 ml/m  RA Volume:   9.46 ml  4.69 ml/m LA Vol (A4C):   31.4 ml 15.57 ml/m LA Biplane Vol: 30.9 ml 15.32 ml/m  AORTIC VALVE LVOT Vmax:   130.00 cm/s LVOT Vmean:  87.200 cm/s LVOT VTI:    0.259 m AI PHT:      842 msec  AORTA Ao Root diam: 3.60 cm Ao Asc diam:  3.80 cm MITRAL VALVE               TRICUSPID VALVE MV Area (PHT): 1.93 cm    TR Peak grad:   7.2 mmHg MV Decel Time: 394 msec    TR Vmax:        134.00 cm/s MV E velocity: 50.10 cm/s MV A velocity: 93.80 cm/s  SHUNTS MV E/A ratio:  0.53        Systemic VTI:  0.26 m                            Systemic Diam: 2.50 cm [name] MD Electronically signed by [name] MD Signature Date/Time: 04/15/2023/3:27:38 PM    Final    VAS [place] LOWER EXTREMITY VENOUS (DVT)  Result Date: 04/15/2023  Lower Venous DVT Study Patient Name:  WASIL WOLKE  Date of Exam:   04/15/2023 Medical Rec #: [id number]    Accession #:  [id number] Date of Birth: 1945/11/03    Patient Gender: M Patient Age:   63 years Exam Location:  [facility]  Procedure:      VAS [place] LOWER EXTREMITY VENOUS (DVT) Referring Phys: [facility] GOEL --------------------------------------------------------------------------------  Indications: Pulmonary embolism.  Comparison Study: No previous exams Performing Technologist: Jody Hill RVT, RDMS  Examination Guidelines: A complete evaluation includes B-mode imaging, spectral Doppler, color Doppler, and power Doppler as needed of all accessible portions of each vessel. Bilateral testing is considered an integral part of a complete examination. Limited examinations for reoccurring indications may be performed as noted. The reflux portion of the exam is performed with the patient in reverse Trendelenburg.  +---------+---------------+---------+-----------+----------+--------------+ RIGHT    CompressibilityPhasicitySpontaneityPropertiesThrombus Aging +---------+---------------+---------+-----------+----------+--------------+ CFV      Full           Yes      Yes                                 +---------+---------------+---------+-----------+----------+--------------+ SFJ      Full                                                        +---------+---------------+---------+-----------+----------+--------------+ FV Prox  Full           Yes      Yes                                 +---------+---------------+---------+-----------+----------+--------------+ FV Mid   Full           Yes      Yes                                 +---------+---------------+---------+-----------+----------+--------------+ FV DistalFull           Yes      Yes                                 +---------+---------------+---------+-----------+----------+--------------+ PFV      Full                                                        +---------+---------------+---------+-----------+----------+--------------+ POP      Full           Yes      Yes                                  +---------+---------------+---------+-----------+----------+--------------+ PTV      None           No       No                   Acute          +---------+---------------+---------+-----------+----------+--------------+ PERO     Full                                                        +---------+---------------+---------+-----------+----------+--------------+   +---------+---------------+---------+-----------+----------+--------------+  LEFT     CompressibilityPhasicitySpontaneityPropertiesThrombus Aging +---------+---------------+---------+-----------+----------+--------------+ CFV      Full           Yes      Yes                                 +---------+---------------+---------+-----------+----------+--------------+ SFJ      Full                                                        +---------+---------------+---------+-----------+----------+--------------+ FV Prox  Full           Yes      Yes                                 +---------+---------------+---------+-----------+----------+--------------+ FV Mid   Full           Yes      Yes                                 +---------+---------------+---------+-----------+----------+--------------+ FV DistalFull           Yes      Yes                                 +---------+---------------+---------+-----------+----------+--------------+ PFV      Full                                                        +---------+---------------+---------+-----------+----------+--------------+ POP      Full           Yes      Yes                                 +---------+---------------+---------+-----------+----------+--------------+ PTV      None           No       No                   Acute          +---------+---------------+---------+-----------+----------+--------------+ PERO     Full                                                         +---------+---------------+---------+-----------+----------+--------------+     Summary: BILATERAL: -No evidence of popliteal cyst, bilaterally. RIGHT: - Findings consistent with acute deep vein thrombosis involving the right posterior tibial veins.   LEFT: - Findings consistent with acute deep vein thrombosis involving the left posterior tibial veins.   *See table(s) above for measurements and observations. Electronically signed by Carolynn Sayers on 04/15/2023 at 1:25:45 PM.    Final    US Abdomen Limited RUQ (  LIVER/GB)  Result Date: 04/15/2023 CLINICAL DATA:  Elevated LFTs EXAM: ULTRASOUND ABDOMEN LIMITED RIGHT UPPER QUADRANT COMPARISON:  Chest CT today FINDINGS: Gallbladder: Stones and sludge seen within the gallbladder. No wall thickening. The patient was tender over the gallbladder during the study. Common bile duct: Diameter: Normal caliber, 3 mm Liver: Increased echotexture compatible with fatty infiltration. No focal abnormality or biliary ductal dilatation. Portal vein is patent on color Doppler imaging with normal direction of blood flow towards the liver. Other: None. IMPRESSION: Gallstones and sludge within the gallbladder. The patient was tender over the gallbladder during the study. Recommend clinical correlation for possible cholecystitis. Fatty liver. Electronically Signed   By: Charlett Nose M.D.   On: 04/15/2023 00:20   CT Soft Tissue Neck W Contrast  Result Date: 04/14/2023 CLINICAL DATA:  Initial evaluation for acute difficulty swallowing over past few days. EXAM: CT NECK WITH CONTRAST TECHNIQUE: Multidetector CT imaging of the neck was performed using the standard protocol following the bolus administration of intravenous contrast. RADIATION DOSE REDUCTION: This exam was performed according to the departmental dose-optimization program which includes automated exposure control, adjustment of the mA and/or kV according to patient size and/or use of iterative reconstruction technique.  CONTRAST:  OMNIPAQUE IOHEXOL 300 MG/ML  SOLN COMPARISON:  None Available. FINDINGS: Pharynx and larynx: Oral cavity within normal limits. Oropharynx and nasopharynx within normal limits. No retropharyngeal collection or swelling. Epiglottis within normal limits. Hypopharynx and supraglottic larynx normal. Glottis symmetric and normal. Subglottic airway patent clear. Salivary glands: Salivary glands including the parotid and submandibular glands are within normal limits. Thyroid: Normal. Lymph nodes: No enlarged or pathologic adenopathy. Vascular: Normal intravascular enhancement seen within the neck. Mild atheromatous change about the carotid bifurcations. Limited intracranial: Unremarkable. Visualized orbits: Unremarkable. Mastoids and visualized paranasal sinuses: Clear/normal. Skeleton: 6 mm sclerotic lesion within the T2 vertebral body noted, nonspecific. No other worrisome osseous lesions. 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 Upper chest: No acute finding. Other: None. IMPRESSION: 1. Negative CT of the neck. No acute inflammatory changes or other abnormality identified. 2. 6 mm sclerotic lesion within the T2 vertebral body, favored benign, but incompletely assessed on this exam. Further evaluation with dedicated MRI, with and without contrast, recommended for complete evaluation. Electronically Signed   By: Rise Mu M.D.   On: 04/14/2023 17:50   CT Chest W Contrast  Result Date: 04/14/2023 CLINICAL DATA:  Dysphagia, difficulty swallowing even liquids over the last few days, nausea, vomiting EXAM: CT CHEST WITH CONTRAST TECHNIQUE: Multidetector CT imaging of the chest was performed during intravenous contrast administration. RADIATION DOSE REDUCTION: This exam was performed according to the departmental dose-optimization program which includes automated exposure control, adjustment of the mA and/or kV according to patient size  and/or use of iterative reconstruction technique. CONTRAST:  OMNIPAQUE IOHEXOL 300 MG/ML  SOLN COMPARISON:  Chest radiograph earlier today FINDINGS: Cardiovascular: Acute pulmonary emboli at the bifurcation of the right mainstem bronchus extending into the right upper lobe and right lower lobe pulmonary arteries. No evidence of right heart strain. No pericardial effusion. Aortic atherosclerotic calcification. Mediastinum/Nodes: Wall thickening of the lower esophagus with layering debris in the esophagus upstream from the area of thickening. Trace fluid about the lower esophagus. Trachea is unremarkable. No lymphadenopathy. Lungs/Pleura: No focal consolidation, pleural effusion, or pneumothorax. 4 mm ground-glass nodule in the left lower lobe, likely infectious/inflammatory. Per Fleischner Society guidelines, no routine follow-up imaging is recommended. These guidelines do  not apply to immunocompromised patients and patients with cancer. Follow up in patients with significant comorbidities as clinically warranted. For lung cancer screening, adhere to Lung-RADS guidelines. Reference: Radiology. 2017; 284(1):228-43. Upper Abdomen: No acute abnormality. Musculoskeletal: No acute fracture. IMPRESSION: 1. Acute pulmonary embolism in the right main pulmonary artery extending into the upper and lower lobes. No evidence of right heart strain. 2. Wall thickening of the lower esophagus with layering debris upstream from the area of thickening. Trace fluid about the lower esophagus. This may be due to esophagitis however correlation with endoscopy is recommended to exclude malignancy. Aortic Atherosclerosis (ICD10-I70.0). Critical Value/emergent results were called by telephone at the time of interpretation on 04/14/2023 at 5:32 pm to provider Sabra Heck , who verbally acknowledged these results. Electronically Signed   By: Minerva Fester M.D.   On: 04/14/2023 17:32   DG Chest Port 1 View  Result Date:  04/14/2023 CLINICAL DATA:  Chest discomfort EXAM: PORTABLE CHEST 1 VIEW COMPARISON:  None Available. FINDINGS: The heart size and mediastinal contours are within normal limits. Bibasilar scarring/atelectasis. The lungs are otherwise clear. Advanced arthritis left shoulder. Aortic atherosclerotic calcification. IMPRESSION: No active disease. Electronically Signed   By: Minerva Fester M.D.   On: 04/14/2023 15:57       IMPRESSION/ PLAN:  The patient has been diagnosed with adenocarcinoma of the distal esophagus.  A feeding tube has been placed.  This clinically appears to potentially represent a T3 N0 M0 tumor.  Endoscopic ultrasound will be attempted for further staging clarification.  I discussed with the patient a potential 5-1/2-week course of radiation treatment to be given with concurrent chemotherapy.  We discussed the rationale of such a treatment as well as possible side effects and risks.  All of the patient's questions were answered and he does wish to proceed with this treatment.  He is looking forward to seeing cardiothoracic surgery as well to further discuss options.  He will proceed with simulation in the near future for treatment planning.  I anticipate beginning his course of chemoradiation treatment on 05/22/2023.     The patient was seen in person today in clinic.  The total time spent on the patient's visit today was 60 minutes, including chart review, direct discussion/evaluation with the patient, and coordination of care.      ________________________________   Radene Gunning, MD, PhD   **Disclaimer: This note was dictated with voice recognition software. Similar sounding words can inadvertently be transcribed and this note may contain transcription errors which may not have been corrected upon publication of note.**

## 2023-05-09 NOTE — Progress Notes (Signed)
GI Location of Tumor / Histology: Esophagus  Samuel [name] presented with dysphagia and weight loss.   Biopsies of Esophageal Mass 04/18/2023    Past/Anticipated interventions by surgeon, if any:  -PEG Tube insertion   Past/Anticipated interventions by medical oncology, if any:  Dr. Mosetta Putt -ChemoRT -Possible start 05/22/2023   Weight changes, if any: He has lost about 40 pounds in the last 3-4 months.  Bowel/Bladder complaints, if any: He reports some constipation, taking senna PRN.  Nausea / Vomiting, if any: He reports increased saliva and mucus production.    Pain issues, if any:  He reports occasional discomfort.  Swallowing: He is receiving all of his nutrition via PEG Tube.  He is doing bolus feeds and free water.  He is not taking anything PO.  SAFETY ISSUES: Prior radiation? No Pacemaker/ICD? No Possible current pregnancy? N/a Is the patient on methotrexate? No  Current Complaints/Details:

## 2023-05-09 NOTE — Progress Notes (Signed)
77 year old male diagnosed with esophageal cancer.  He is followed by Dr. Mosetta Putt and Dr. Mitzi Hansen.  He will receive concurrent chemoradiation therapy with carboplatin and Taxol weekly.  Patient diagnosed with severe malnutrition during hospitalization on October 18.  Past medical history includes diabetes.  Medications include multivitamin and Zofran.  Labs include Glucose 122 on October 31.  Height: 66 inches. Weight: 171 pounds on November 5.  Weight improved 166 pounds on October 22. Usual body weight: 200 pounds. BMI: 26.6.  Estimated nutrition needs: 2100-2300 cal, 110-125 g protein, greater than 2.2 L fluid.  Patient's status post PEG and interventional radiology on October 17 due to severe dysphagia and malnutrition.  He is currently NPO.  Tube feeding: 6 cartons Osmolite 1.5 with additional 720 mL free water flushes provides 2130 cal, 90 g protein, 1806 mL free water.  Home health: [facility]  Patient is giving 1 carton Osmolite 1.5 with 60 mL of free water before and after each bolus 6 times daily.  He is tolerating this well however is causing him to wake up overnight to do feedings.  He denies nausea and vomiting.  Reports constipation is controlled.  He confirms he has adequate tube feeding formula and supplies at home.  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  Intervention: Changed bolus feeding to 1-1/2 cartons 4 times daily at 830, 1230, 430, and 830.  Flush with 60 mL free water before and after bolus. Add Prosource TF 3 times daily between bolus feedings with additional 60 mL water.  Provided samples.  Patient will order additional protein via [employer]. Continue additional free water with medications via tube. Continue bowel regimen.  Osmolite 1.5+ Prosource TF provides 2250 cal and 123 g protein.   This is meeting 100% estimated calorie and protein needs.  Patient has good understanding of tube feeding regimen.  Written fact sheet provided.  Monitoring, evaluation, goals: Patient will tolerate tube feeding at goal rate to minimize further weight loss and improve malnutrition.  Next visit: Monday, November 18 during infusion.  **Disclaimer: This note was dictated with voice recognition software. Similar sounding words can inadvertently be transcribed and this note may contain transcription errors which may not have been corrected upon publication of note.**

## 2023-05-10 DIAGNOSIS — C155 Malignant neoplasm of lower third of esophagus: Secondary | ICD-10-CM | POA: Diagnosis not present

## 2023-05-11 ENCOUNTER — Ambulatory Visit: Admission: RE | Admit: 2023-05-11 | Payer: 59 | Source: Ambulatory Visit | Admitting: Radiation Oncology

## 2023-05-11 DIAGNOSIS — Z5111 Encounter for antineoplastic chemotherapy: Secondary | ICD-10-CM | POA: Insufficient documentation

## 2023-05-11 DIAGNOSIS — Z510 Encounter for antineoplastic radiation therapy: Secondary | ICD-10-CM | POA: Insufficient documentation

## 2023-05-11 DIAGNOSIS — Z931 Gastrostomy status: Secondary | ICD-10-CM | POA: Diagnosis not present

## 2023-05-11 DIAGNOSIS — C155 Malignant neoplasm of lower third of esophagus: Secondary | ICD-10-CM | POA: Insufficient documentation

## 2023-05-11 DIAGNOSIS — E43 Unspecified severe protein-calorie malnutrition: Secondary | ICD-10-CM | POA: Insufficient documentation

## 2023-05-11 DIAGNOSIS — Z7901 Long term (current) use of anticoagulants: Secondary | ICD-10-CM | POA: Diagnosis not present

## 2023-05-12 ENCOUNTER — Other Ambulatory Visit: Payer: Self-pay

## 2023-05-12 ENCOUNTER — Telehealth: Payer: Self-pay

## 2023-05-15 NOTE — Progress Notes (Signed)

## 2023-05-15 NOTE — Patient Instructions (Signed)
Visit Information  Thank you for taking time to visit with me today. Please don't hesitate to contact me if I can be of assistance to you before our next scheduled telephone appointment.  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   RNCM Clinical Goal(s):  Patient will work with the Care Management team over the next 30 days to address Transition of Care Barriers: Need for Oncology follow up on malignant-appearing esophageal stenosis/biopsy results with Dr. Mosetta Putt on 04/24/23; Adjusting to NPO status and need for Osmolite 1.5 tube feedings to address protein calorie malnutrition through collaboration with RN Care manager, provider, and care team. 11/1 Update - Patient has had appt w/ Dr. Mosetta Putt and will be pursuing chemo/radiation as soon as possible, as well as planning for Esophagectomy once he is strong enough for surgery. Patient has an excellent mindset, positive attitude, and is pro-active in his personal healthcare management. In order to undergo an esophagectomy, however, patient needs to increase his protein intake(noted for having severe protein cal malnutrition) and increase his overall physical stamina/conditioning from current deconditioning status d/t recent hospitlaization before undergoing surgery to remove part or all of the esophagus and then having the "esophagus" rebuilt from part of the stomach with the end result = removal of cancerous tissue and ability to eventually eat solid food again.  Interventions: Evaluation of current treatment plan related to self management and patient's adherence to plan as established by provider Ongoing Short Term Oncology  Interventions: Assessed patient understanding of cancer diagnosis and recommended treatment plan Reviewed upcoming provider appointments and treatment appointments Assessed available transportation to appointments and treatments. Has consistent/reliable transportation: Yes Assessed support system. Has consistent/reliable family or other support: Yes PHQ2/PHQ9 performed Nutrition assessment performed  Patient Goals/Self-Care Activities Participate in Transition of Care Program/Attend TOC scheduled calls Take all medications as prescribed Call pharmacy for medication refills 3-7 days in advance of running out of medications Call provider office for new concerns or questions    The patient has been provided with contact information for the care management team and has been advised to call with any health related questions or concerns.   Please call the care guide team at [phone] if you need to cancel or reschedule your appointment.   Please call 1-800-273-TALK (toll free, 24 hour hotline) if you are experiencing a Mental Health or Behavioral Health Crisis or need someone to talk to.  [name], RN, BA, [facility], CRRN [facility] Coordinator, Transition of Care Ph # [phone]

## 2023-05-15 NOTE — Transitions of Care (Post Inpatient/ED Visit) (Signed)
     Name: Samuel [name] MRN: [id number] DOB: 07-Jan-1946  Today's TOC FU Call Status: Successful      Patient's Name and Date of Birth confirmed.  Transition Care Management Follow-up Telephone Call    Items Reviewed:    Medications Reviewed Today: Medications Reviewed Today     Reviewed by Marcos Eke, RN (Registered Nurse) on 05/05/23 at 1143  Med List Status: <None>   Medication Order Taking? Sig Documenting Provider Last Dose Status Informant  apixaban (ELIQUIS) 5 MG TABS tablet [id number] No Place 1 tablet (5 mg total) into feeding tube 2 (two) times daily. Azucena Fallen, MD Taking Active   Blood Glucose Monitoring Suppl (ACCU-CHEK GUIDE ME) w/Device Andria Rhein [id number]  USE AS DIRECTED 3 TIMES A DAY Newlin, Enobong, MD  Active Self  glucose blood (ACCU-CHEK GUIDE) test strip [id number]  Use as instructed daily [name], MD  Active Self  Multiple Vitamins-Minerals (MULTIVITAMIN MEN) TABS [id number]  Take 1 tablet by mouth daily with breakfast. [provider]  Active Self  Nutritional Supplements (FEEDING SUPPLEMENT, OSMOLITE 1.5 CAL,) LIQD [id number] No Place 237 mLs into feeding tube 6 (six) times daily. Azucena Fallen, MD Taking Active   ondansetron (ZOFRAN-ODT) 4 MG disintegrating tablet [id number]  Take 1 tablet (4 mg total) by mouth every 8 (eight) hours as needed for nausea or vomiting. [name], MD  Active   Water For Irrigation, Sterile (FREE WATER) Criss Rosales [id number] No Place 120 mLs into feeding tube 6 (six) times daily. Azucena Fallen, MD Taking Active             Home Care and Equipment/Supplies: See previous documentation completed on 04/28/23    Functional Questionnaire: See previous documentation completed on 04/28/23      Follow up appointments reviewed:See previous documentation completed on 04/28/23        [name], RN, BA, [facility], CRRN [facility] Population Health Care Management Coordinator, Transition of Care Ph # [phone]

## 2023-05-15 NOTE — Patient Outreach (Signed)
  Care Management  Transitions of Care Program Transitions of Care Post-discharge week 2   05/12/23 Name: Samuel [name] MRN: [id number] DOB: 1945-09-22  Subjective: Samuel [name] is a 77 y.o. year old male who is a primary care patient of [name], MD. The Care Management team Engaged with patient Engaged with patient by telephone to assess and address transitions of care needs.   Consent to Services:  Patient was given information about Managed Medicaid Care Management services, agreed to services, and gave verbal consent to participate.   Assessment: Please see "Wrap Up". We spent most of our hour engagement discussing mind/body preparation for upcoming start of chemotherapy and radiation, strategies to increase total protein/albumen levels including use of PROsource protein supplements via PEG tube, discussing the addition of medications to counter acid reflux with radiation therapy, and reviewing proactive endeavors patient can engage in to increase his physical and mental conditioning for upcoming/planned esophagectomy. Currently, patient actively meditates. Patient needs to increase physical endurance slowly, but steadily as well.   Plan: The patient has been provided with contact information for the care management team and has been advised to call with any health related questions or concerns.   [name], RN, BA, [facility], CRRN [facility] Coordinator, Transition of Care Ph # [phone]

## 2023-05-16 ENCOUNTER — Other Ambulatory Visit: Payer: Self-pay

## 2023-05-17 ENCOUNTER — Other Ambulatory Visit: Payer: Self-pay | Admitting: Hematology

## 2023-05-17 DIAGNOSIS — C159 Malignant neoplasm of esophagus, unspecified: Secondary | ICD-10-CM

## 2023-05-17 MED ORDER — DEXAMETHASONE 4 MG PO TABS: ORAL_TABLET | ORAL | 1 refills | Status: DC

## 2023-05-17 MED ORDER — PROCHLORPERAZINE MALEATE 10 MG PO TABS: 10.0000 mg | ORAL_TABLET | Freq: Four times a day (QID) | ORAL | 1 refills | Status: DC | PRN

## 2023-05-17 MED ORDER — LIDOCAINE-PRILOCAINE 2.5-2.5 % EX CREA: TOPICAL_CREAM | CUTANEOUS | 3 refills | Status: DC

## 2023-05-17 MED ORDER — ONDANSETRON HCL 8 MG PO TABS: 8.0000 mg | ORAL_TABLET | Freq: Three times a day (TID) | ORAL | 1 refills | Status: DC | PRN

## 2023-05-18 ENCOUNTER — Inpatient Hospital Stay: Payer: 59

## 2023-05-19 ENCOUNTER — Other Ambulatory Visit: Payer: Self-pay

## 2023-05-19 ENCOUNTER — Telehealth: Payer: Self-pay

## 2023-05-19 ENCOUNTER — Encounter: Payer: Self-pay | Admitting: Thoracic Surgery (Cardiothoracic Vascular Surgery)

## 2023-05-19 ENCOUNTER — Encounter: Payer: 59 | Admitting: Thoracic Surgery (Cardiothoracic Vascular Surgery)

## 2023-05-19 ENCOUNTER — Institutional Professional Consult (permissible substitution) (INDEPENDENT_AMBULATORY_CARE_PROVIDER_SITE_OTHER): Payer: 59 | Admitting: Thoracic Surgery (Cardiothoracic Vascular Surgery)

## 2023-05-19 VITALS — BP 120/77 | HR 72 | Resp 20 | Ht 66.0 in | Wt 176.0 lb

## 2023-05-19 DIAGNOSIS — C155 Malignant neoplasm of lower third of esophagus: Secondary | ICD-10-CM | POA: Diagnosis not present

## 2023-05-19 NOTE — Patient Outreach (Signed)
Care Management  Transitions of Care Program Transitions of Care Post-discharge Week 3+   05/19/2023 Name: Samuel [name] MRN: [id number] DOB: 04-03-1946  Subjective: Samuel [name] is a 77 y.o. year old male who is a primary care patient of [name], MD. The Care Management team Engaged with patient Engaged with patient by telephone to assess and address transitions of care needs.   Consent to Services:  Patient was given information about care management services, agreed to services, and gave verbal consent to participate.   Assessment:           SDOH Interventions    Flowsheet Row Clinical Support from 05/08/2023 in [facility] Of 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   RNCM Clinical Goal(s):  Patient will work with the Care Management team over the next 30 days to address Transition of Care Barriers: Need for Oncology follow up on malignant-appearing esophageal stenosis/biopsy results with Dr. Mosetta Putt on 04/24/23; Adjusting to NPO status and need for Osmolite 1.5 tube feedings to address protein calorie malnutrition  through collaboration with RN Care manager, provider, and care team. 11/1 Update - Patient has had appt w/ Dr. Mosetta Putt and will be pursuing chemo/radiation as soon as possible, as well as planning for Esophagectomy once he is strong enough for surgery. Patient has an excellent mindset, positive attitude, and is pro-active in his personal healthcare management. In order to undergo an  esophagectomy, however, patient needs to increase his protein intake(noted for having severe protein cal malnutrition) and increase his overall physical stamina/conditioning from current deconditioning status d/t recent hospitlaization before undergoing surgery to remove part or all of the esophagus and then having the "esophagus" rebuilt from part of the stomach with the end result = removal of cancerous tissue and ability to eventually eat solid food again. 11/15 Update - patient starting chemotherapy and radiation treatment twice weekly at the [facility] starting Monday, 11/18. Received education and tours from "[place]" of Oncology and assigned an Oncology Nurse Navigator named [place]. Oriented to Infusion center & staff as well as Radiation department staff. Has appointment with Surgeon, Dr. Brynda Greathouse at 12:30 today, 11/15 to discuss probable esophagectomy following chemotherapy/radiation treatment.  Interventions: Evaluation of current treatment plan related to  self management and patient's adherence to plan as established by provider   Oncology:  (Status: ONGOING) Short Term Goal Assessment of understanding of oncology diagnosis: 11/1 patient is highly intelligent and has an excellent understanding of his oncology diagnosis Assessed patient understanding of cancer diagnosis and recommended treatment plan Reviewed upcoming provider appointments and treatment appointments Assessed available transportation to appointments and treatments. Has consistent/reliable transportation: Yes Assessed support system. Has consistent/reliable family or other support: Yes PHQ2/PHQ9 performed Nutrition assessment performed  Patient Goals/Self-Care Activities: Participate in Transition of Care Program/Attend TOC scheduled calls Take all medications as prescribed Call pharmacy for medication refills 3-7 days in advance of running out of medications Call provider office for new concerns or  questions  Follow Up Plan:  The patient has been provided with contact information for the care management team and has been advised to call with any health related questions or concerns.          Plan: The patient has been provided with contact information for the care management team and has been advised to call with any health related questions or concerns.   [name], RN, BA, [facility], CRRN [facility] Coordinator, Transition of Care Ph # [phone]

## 2023-05-19 NOTE — Patient Instructions (Signed)
Visit Information  Hi Samuel [name],  Thank you for taking time to visit with me today. I know you have an appointment with Surgeon, Dr. Brynda Greathouse today at 12:30pm and hope you are able to get many of your questions answered about the surgical needed to remove your esophagus and reconstruction of a newly-formed passageway in order for you to be able to swallow food again (utilizing your stomach), after your chemo/radiation treatments . Please don't hesitate to contact me if I can be of assistance to you before our next scheduled telephone appointment.  Our next appointment is by tele-visit on 11/22 at 10:30am  Warm regards,  [name]  Following is a copy of your care plan:   Goals Addressed             This Visit's Progress    Transition of Care       Current Barriers:  Chronic Disease Management support and education needs related to Esophageal stenosis and rapid onset of dysphagia, initially solids transitioning to liquids, now unable to tolerate any po without severe pleuritic chest pain.G-tube placement on 10/17, NPO.   RNCM Clinical Goal(s):  Patient will work with the Care Management team over the next 30 days to address Transition of Care Barriers: Need for Oncology follow up on malignant-appearing esophageal stenosis/biopsy results with Dr. Mosetta Putt on 04/24/23; Adjusting to NPO status and need for Osmolite 1.5 tube feedings to address protein calorie malnutrition  through collaboration with RN Care manager, provider, and care team. 11/1 Update - Patient has had appt w/ Dr. Mosetta Putt and will be pursuing chemo/radiation as soon as possible, as well as planning for Esophagectomy once he is strong enough for surgery. Patient has an excellent mindset, positive attitude, and is pro-active in his personal healthcare management. In order to undergo an esophagectomy, however, patient needs to increase his protein intake(noted for having severe protein cal malnutrition) and increase his overall physical  stamina/conditioning from current deconditioning status d/t recent hospitlaization before undergoing surgery to remove part or all of the esophagus and then having the "esophagus" rebuilt from part of the stomach with the end result = removal of cancerous tissue and ability to eventually eat solid food again. 11/15 Update - patient starting chemotherapy and radiation treatment twice weekly at the [facility] starting Monday, 11/18. Received education and tours from "[name]" of Oncology and assigned an Oncology Nurse Navigator named [name]. Oriented to Infusion center & staff as well as Radiation department staff. Has appointment with Surgeon, Dr. Brynda Greathouse at 12:30 today, 11/15 to discuss probable esophagectomy following chemotherapy/radiation treatment.  Interventions: Evaluation of current treatment plan related to  self management and patient's adherence to plan as established by provider   Oncology:  (Status: ONGOING) Short Term Goal Assessment of understanding of oncology diagnosis: 11/1 patient is highly intelligent and has an excellent understanding of his oncology diagnosis Assessed patient understanding of cancer diagnosis and recommended treatment plan Reviewed upcoming provider appointments and treatment appointments Assessed available transportation to appointments and treatments. Has consistent/reliable transportation: Yes Assessed support system. Has consistent/reliable family or other support: Yes PHQ2/PHQ9 performed Nutrition assessment performed  Patient Goals/Self-Care Activities: Participate in Transition of Care Program/Attend TOC scheduled calls Take all medications as prescribed Call pharmacy for medication refills 3-7 days in advance of running out of medications Call provider office for new concerns or questions   Follow Up Plan:  The patient has been provided with contact information for the care management team and has been advised to call  with any  health related questions or concerns.          Patient verbalizes understanding of instructions and care plan provided today and agrees to view in MyChart. Active MyChart status and patient understanding of how to access instructions and care plan via MyChart confirmed with patient.     The patient has been provided with contact information for the care management team and has been advised to call with any health related questions or concerns.   Please call the care guide team at [phone] if you need to cancel or reschedule your appointment.   Please call 1-800-273-TALK (toll free, 24 hour hotline) if you are experiencing a Mental Health or Behavioral Health Crisis or need someone to talk to.  [name], RN, BA, [facility], CRRN [facility] Coordinator, Transition of Care Ph # [phone]

## 2023-05-19 NOTE — Progress Notes (Signed)
[address]       [place] [address]             [id number]                    [name] [facility] Medical Record #[id number] Date of Birth: 20-Mar-1946  Referring: [name], MD Primary Care: [name], MD Primary Cardiologist: [name], MD  Chief Complaint:    Chief Complaint  Patient presents with   Esophageal Cancer    New patient consultation, PET 10/28, Chest CT 10/11, Barium swallow 10/14, Upper Endo 10/14    History of Present Illness:    Samuel [name] 77 y.o. male presents for surgical evaluation diagnosed distal esophageal cancer.  This was found incidentally after he developed progressive dysphagia, tachypnea, and a 50 pound weight loss.  He subsequently had a PEG tube placed, and has started to put on more weight.  In regards to his living situation, he recently married a woman from the [place].  Unfortunate she is not present at this visit.  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      Zubrod Score: At the time of surgery this patient's most appropriate activity status/level should be described as: []     0    Normal activity, no symptoms []     1    Restricted in physical strenuous activity but ambulatory, able to do out light work [x]     2    Ambulatory and capable of self care, unable to do work activities, up and about               >50 % of waking hours                              []     3    Only limited self care, in bed greater than 50% of waking hours []     4    Completely disabled, no self care, confined to bed or chair []     5    Moribund   Past Medical History:  Diagnosis Date   Diabetes mellitus without complication [facility])     Past Surgical History:  Procedure Laterality Date   BIOPSY  04/18/2023   Procedure: BIOPSY;  Surgeon: Lynann Bologna, DO;  Location[facility] ENDOSCOPY;  Service: Gastroenterology;;   ESOPHAGOGASTRODUODENOSCOPY N/A  04/18/2023   Procedure: ESOPHAGOGASTRODUODENOSCOPY (EGD);  Surgeon: Lynann Bologna, DO;  Location: [facility] ENDOSCOPY;  Service: Gastroenterology;  Laterality: N/A;   IR GASTROSTOMY TUBE MOD SED  04/20/2023   IR NASO G TUBE PLC W/FL W/RAD  04/20/2023   KNEE ARTHROPLASTY Bilateral     Family History  Problem Relation Age of Onset   Hypertension Father    COPD Father    COPD Sister    Diabetes Maternal Grandfather      Social History   Tobacco Use  Smoking Status Former   Current packs/day: 0.00   Average packs/day: 1 pack/day for 15.0 years (15.0 ttl pk-yrs)   Types: Cigarettes   Start date: 06/14/1970   Quit date: 06/14/1985   Years since quitting: 37.9  Smokeless Tobacco Never    Social History   Substance and Sexual Activity  Alcohol Use Yes   Alcohol/week: 1.0 - 2.0 standard drink of alcohol   Types: 1 - 2 Cans  of beer per week     Allergies  Allergen Reactions   Penicillins     Shortness of breath   Sulfa Antibiotics     Blisters    Procaine Other (See Comments)    Caused fatigue    Current Outpatient Medications  Medication Sig Dispense Refill   apixaban (ELIQUIS) 5 MG TABS tablet Place 1 tablet (5 mg total) into feeding tube 2 (two) times daily. 60 tablet 0   Blood Glucose Monitoring Suppl (ACCU-CHEK GUIDE ME) w/Device KIT USE AS DIRECTED 3 TIMES A DAY 1 kit 0   dexamethasone (DECADRON) 4 MG tablet Take 2 tablets daily for 2 days, start the day after chemotherapy. Take with food. 30 tablet 1   glucose blood (ACCU-CHEK GUIDE) test strip Use as instructed daily 100 each 12   lidocaine-prilocaine (EMLA) cream Apply to affected area once 30 g 3   Multiple Vitamins-Minerals (MULTIVITAMIN MEN) TABS Take 1 tablet by mouth daily with breakfast.     Nutritional Supplements (FEEDING SUPPLEMENT, OSMOLITE 1.5 CAL,) LIQD Place 237 mLs into feeding tube 6 (six) times daily. 42660 mL 0   ondansetron (ZOFRAN) 8 MG tablet Take 1 tablet (8 mg total) by mouth every 8 (eight)  hours as needed for nausea or vomiting. Start on the third day after chemotherapy. 30 tablet 1   ondansetron (ZOFRAN-ODT) 4 MG disintegrating tablet Take 1 tablet (4 mg total) by mouth every 8 (eight) hours as needed for nausea or vomiting. 20 tablet 0   prochlorperazine (COMPAZINE) 10 MG tablet Take 1 tablet (10 mg total) by mouth every 6 (six) hours as needed for nausea or vomiting. 30 tablet 1   Water For Irrigation, Sterile (FREE WATER) SOLN Place 120 mLs into feeding tube 6 (six) times daily. 24000 mL 0   No current facility-administered medications for this visit.    Review of Systems  Constitutional:  Positive for malaise/fatigue and weight loss.  Cardiovascular:  Negative for chest pain.  Neurological: Negative.      PHYSICAL EXAMINATION: BP 120/77 (BP Location: Left Arm, Patient Position: Sitting, Cuff Size: Normal)   Pulse 72   Resp 20   Ht 5\' 6"  (1.676 m)   Wt 176 lb (79.8 kg)   SpO2 96% Comment: RA  BMI 28.41 kg/m  Physical Exam Constitutional:      General: He is not in acute distress.    Appearance: He is ill-appearing.  HENT:     Head: Normocephalic and atraumatic.  Eyes:     Extraocular Movements: Extraocular movements intact.  Cardiovascular:     Rate and Rhythm: Normal rate.  Pulmonary:     Effort: Pulmonary effort is normal.  Abdominal:     General: There is no distension.  Musculoskeletal:        General: Normal range of motion.     Cervical back: Normal range of motion.  Skin:    General: Skin is warm and dry.  Neurological:     General: No focal deficit present.     Mental Status: He is alert and oriented to person, place, and time.     Diagnostic Studies & Laboratory data:     PET/CT:  MPRESSION: 1. Long segment hypermetabolic activity within the distal thoracic esophagus, consistent with known esophageal cancer. 2. No evidence of metastatic disease. A small high right paratracheal lymph node demonstrates low-level metabolic  activity and is nonspecific, possibly reactive. 3. Interval volume loss in the left lower lobe with paramediastinal opacity and low level  metabolic activity, which could reflect atelectasis or aspiration. 4. Stable cystic appearing-lesion within the pancreatic head, without hypermetabolic activity, likely a side branch IPMN. Given the patient's history, suggest attention on follow-up imaging for the patient's esophageal cancer. 5. Interval percutaneous G-tube placement with low level activity surrounding the skin entrance site. 6.  Aortic Atherosclerosis (ICD10-I70.0).  EGD/EUS:  Findings: One malignant- appearing, intrinsic severe ( stenosis; an endoscope cannot pass) stenosis was found 30 to 34 cm from the incisors. This stenosis measured 4 cm ( in length) . The stenosis was traversed after downsizing scope. Biopsies were taken with a cold forceps for histology.  The Z- line was regular and was found 37 cm from the incisors.  A 3 cm hiatal hernia was present.  Path: C. ESOPHAGUS, BIOPSY:       At least intramucosal adenocarcinoma.       See comment.        I have independently reviewed the above radiology studies  and reviewed the findings with the patient.   Recent Lab Findings: Lab Results  Component Value Date   WBC 10.4 05/04/2023   HGB 16.2 05/04/2023   HCT 49.3 05/04/2023   PLT 290 05/04/2023   GLUCOSE 122 (H) 05/04/2023   CHOL 190 12/15/2022   TRIG 113 12/15/2022   HDL 59 12/15/2022   LDLCALC 111 (H) 12/15/2022   ALT 35 05/04/2023   AST 28 05/04/2023   NA 139 05/04/2023   K 4.6 05/04/2023   CL 102 05/04/2023   CREATININE 0.74 05/04/2023   BUN 17 05/04/2023   CO2 31 05/04/2023   TSH 0.242 (L) 04/15/2023   INR 1.4 (H) 04/20/2023   HGBA1C 7.0 (H) 12/15/2022       Assessment / Plan:   77 year old male with distal esophageal cancer.  He has also had significant weight loss and has a PEG tube in place.  I do not think that an EUS to be possible given the  stenosis from the mass.  He does appear quite frail given his 50 pound weight loss, and his social situation is very complicated.  He remains resolute in the idea of undergoing surgery but is open to the idea of nonsurgical management.  I think that it is he tolerates chemotherapy will ultimately decide whether he is a surgical candidate.  I will continue to follow-up with him during his neoadjuvant therapy.     I  spent 40 minutes with the patient face to face counseling and coordination of care.    Corliss Skains 05/19/2023 4:08 PM

## 2023-05-19 NOTE — Patient Outreach (Signed)
  Care Management  Transitions of Care Program   05/19/2023 Name: Samuel [name] MRN: [id number] DOB: [birth year]  Subjective: Samuel [name] is a 77 y.o. year old male who is a primary care patient of [name], MD. The Care Management team was unable to reach the patient by phone to assess and address transitions of care needs.   Plan: Additional outreach attempts will be made to reach the patient enrolled in the [facility] Program (Post Inpatient/ED Visit).  [name], RN, BA, [facility], CRRN [facility] Coordinator, Transition of Care Ph # [phone]

## 2023-05-21 NOTE — Assessment & Plan Note (Signed)
-  [id number] -Patient presented to hospital with progressive dysphagia and weight loss. -EGD showed a malignant appearing severe stenosis 30 to 34 cm from incisors.  The stenosis measured 4 cm, the scope was not able to pass.  Biopsy confirmed adenocarcinoma -He underwent PEG feeding tube for nutrition -PET scan showed hypermetabolic lesion in the distal esophagus, no nodal or distant metastasis.   -pt was evaluated by cardiothoracic surgeon Dr. Cliffton Asters, he is not a good candidate for surgery, but will re-evaluate him after chemoradiation -Plan to start concurrent chemoradiation with weekly carbo platinum and Taxol today 05/22/23

## 2023-05-22 ENCOUNTER — Other Ambulatory Visit: Payer: Self-pay

## 2023-05-22 ENCOUNTER — Inpatient Hospital Stay: Payer: 59 | Admitting: Nutrition

## 2023-05-22 ENCOUNTER — Emergency Department (HOSPITAL_COMMUNITY): Payer: 59

## 2023-05-22 ENCOUNTER — Inpatient Hospital Stay: Payer: 59

## 2023-05-22 ENCOUNTER — Encounter (HOSPITAL_COMMUNITY): Payer: Self-pay | Admitting: *Deleted

## 2023-05-22 ENCOUNTER — Ambulatory Visit
Admission: RE | Admit: 2023-05-22 | Discharge: 2023-05-22 | Disposition: A | Discharge status: Home / Self Care | Payer: 59 | Source: Ambulatory Visit | Attending: Radiation Oncology | Admitting: Radiation Oncology

## 2023-05-22 ENCOUNTER — Inpatient Hospital Stay (HOSPITAL_BASED_OUTPATIENT_CLINIC_OR_DEPARTMENT_OTHER): Payer: 59 | Admitting: Hematology

## 2023-05-22 ENCOUNTER — Encounter: Payer: Self-pay | Admitting: Hematology

## 2023-05-22 ENCOUNTER — Observation Stay (HOSPITAL_COMMUNITY)
Admission: EM | Admit: 2023-05-22 | Discharge: 2023-05-24 | Disposition: A | Discharge status: Home / Self Care | Payer: 59 | Attending: Internal Medicine | Admitting: Internal Medicine

## 2023-05-22 VITALS — BP 127/83 | HR 101 | Temp 98.3°F | Resp 18 | Wt 172.9 lb

## 2023-05-22 VITALS — BP 126/80 | HR 74 | Temp 98.2°F | Resp 16

## 2023-05-22 DIAGNOSIS — E663 Overweight: Secondary | ICD-10-CM | POA: Insufficient documentation

## 2023-05-22 DIAGNOSIS — Z9221 Personal history of antineoplastic chemotherapy: Secondary | ICD-10-CM | POA: Diagnosis not present

## 2023-05-22 DIAGNOSIS — C159 Malignant neoplasm of esophagus, unspecified: Secondary | ICD-10-CM

## 2023-05-22 DIAGNOSIS — Z7901 Long term (current) use of anticoagulants: Secondary | ICD-10-CM | POA: Insufficient documentation

## 2023-05-22 DIAGNOSIS — R1084 Generalized abdominal pain: Secondary | ICD-10-CM | POA: Diagnosis not present

## 2023-05-22 DIAGNOSIS — E860 Dehydration: Secondary | ICD-10-CM

## 2023-05-22 DIAGNOSIS — Z86711 Personal history of pulmonary embolism: Secondary | ICD-10-CM

## 2023-05-22 DIAGNOSIS — Z4682 Encounter for fitting and adjustment of non-vascular catheter: Secondary | ICD-10-CM | POA: Diagnosis not present

## 2023-05-22 DIAGNOSIS — I110 Hypertensive heart disease with heart failure: Secondary | ICD-10-CM | POA: Diagnosis not present

## 2023-05-22 DIAGNOSIS — Z6827 Body mass index (BMI) 27.0-27.9, adult: Secondary | ICD-10-CM | POA: Diagnosis not present

## 2023-05-22 DIAGNOSIS — Z923 Personal history of irradiation: Secondary | ICD-10-CM | POA: Diagnosis not present

## 2023-05-22 DIAGNOSIS — R7989 Other specified abnormal findings of blood chemistry: Secondary | ICD-10-CM | POA: Diagnosis not present

## 2023-05-22 DIAGNOSIS — R531 Weakness: Secondary | ICD-10-CM | POA: Diagnosis not present

## 2023-05-22 DIAGNOSIS — I5032 Chronic diastolic (congestive) heart failure: Secondary | ICD-10-CM | POA: Diagnosis not present

## 2023-05-22 DIAGNOSIS — K9420 Gastrostomy complication, unspecified: Secondary | ICD-10-CM | POA: Diagnosis not present

## 2023-05-22 DIAGNOSIS — Z87891 Personal history of nicotine dependence: Secondary | ICD-10-CM | POA: Diagnosis not present

## 2023-05-22 DIAGNOSIS — K5730 Diverticulosis of large intestine without perforation or abscess without bleeding: Secondary | ICD-10-CM | POA: Diagnosis not present

## 2023-05-22 DIAGNOSIS — Z931 Gastrostomy status: Secondary | ICD-10-CM | POA: Diagnosis not present

## 2023-05-22 DIAGNOSIS — K8020 Calculus of gallbladder without cholecystitis without obstruction: Secondary | ICD-10-CM | POA: Diagnosis not present

## 2023-05-22 DIAGNOSIS — Z510 Encounter for antineoplastic radiation therapy: Secondary | ICD-10-CM | POA: Diagnosis not present

## 2023-05-22 DIAGNOSIS — N19 Unspecified kidney failure: Secondary | ICD-10-CM

## 2023-05-22 DIAGNOSIS — R000 Tachycardia, unspecified: Secondary | ICD-10-CM | POA: Diagnosis not present

## 2023-05-22 DIAGNOSIS — R079 Chest pain, unspecified: Secondary | ICD-10-CM | POA: Diagnosis not present

## 2023-05-22 DIAGNOSIS — Z96653 Presence of artificial knee joint, bilateral: Secondary | ICD-10-CM | POA: Insufficient documentation

## 2023-05-22 DIAGNOSIS — C155 Malignant neoplasm of lower third of esophagus: Secondary | ICD-10-CM | POA: Diagnosis not present

## 2023-05-22 DIAGNOSIS — E119 Type 2 diabetes mellitus without complications: Secondary | ICD-10-CM | POA: Diagnosis not present

## 2023-05-22 DIAGNOSIS — R770 Abnormality of albumin: Secondary | ICD-10-CM | POA: Diagnosis not present

## 2023-05-22 DIAGNOSIS — Z8501 Personal history of malignant neoplasm of esophagus: Secondary | ICD-10-CM | POA: Insufficient documentation

## 2023-05-22 DIAGNOSIS — Z79899 Other long term (current) drug therapy: Secondary | ICD-10-CM | POA: Insufficient documentation

## 2023-05-22 DIAGNOSIS — R9389 Abnormal findings on diagnostic imaging of other specified body structures: Secondary | ICD-10-CM | POA: Diagnosis not present

## 2023-05-22 DIAGNOSIS — Z743 Need for continuous supervision: Secondary | ICD-10-CM | POA: Diagnosis not present

## 2023-05-22 HISTORY — DX: Malignant neoplasm of esophagus, unspecified: C15.9

## 2023-05-22 HISTORY — DX: Other pulmonary embolism without acute cor pulmonale: I26.99

## 2023-05-22 LAB — COMPREHENSIVE METABOLIC PANEL
ALT: 19 U/L (ref 0–44)
AST: 24 U/L (ref 15–41)
Albumin: 3.1 g/dL — ABNORMAL LOW (ref 3.5–5.0)
Alkaline Phosphatase: 63 U/L (ref 38–126)
Anion gap: 8 (ref 5–15)
BUN: 22 mg/dL (ref 8–23)
CO2: 22 mmol/L (ref 22–32)
Calcium: 8.4 mg/dL — ABNORMAL LOW (ref 8.9–10.3)
Chloride: 107 mmol/L (ref 98–111)
Creatinine, Ser: 0.64 mg/dL (ref 0.61–1.24)
GFR, Estimated: >60 mL/min (ref >60)
Glucose, Bld: 183 mg/dL — ABNORMAL HIGH (ref 70–99)
Potassium: 4.3 mmol/L (ref 3.5–5.1)
Sodium: 137 mmol/L (ref 135–145)
Total Bilirubin: 1 mg/dL (ref <1.2)
Total Protein: 6.3 g/dL — ABNORMAL LOW (ref 6.5–8.1)

## 2023-05-22 LAB — CBC WITH DIFFERENTIAL/PLATELET
Abs Immature Granulocytes: 0.24 10*3/uL — ABNORMAL HIGH (ref 0.00–0.07)
Basophils Absolute: 0 10*3/uL (ref 0.0–0.1)
Basophils Relative: 0 %
Eosinophils Absolute: 0 10*3/uL (ref 0.0–0.5)
Eosinophils Relative: 0 %
HCT: 45.5 % (ref 39.0–52.0)
Hemoglobin: 15.3 g/dL (ref 13.0–17.0)
Immature Granulocytes: 2 %
Lymphocytes Relative: 7 %
Lymphs Abs: 0.7 10*3/uL (ref 0.7–4.0)
MCH: 30.5 pg (ref 26.0–34.0)
MCHC: 33.6 g/dL (ref 30.0–36.0)
MCV: 90.6 fL (ref 80.0–100.0)
Monocytes Absolute: 0.1 10*3/uL (ref 0.1–1.0)
Monocytes Relative: 1 %
Neutro Abs: 9.2 10*3/uL — ABNORMAL HIGH (ref 1.7–7.7)
Neutrophils Relative %: 90 %
Platelets: 259 10*3/uL (ref 150–400)
RBC: 5.02 MIL/uL (ref 4.22–5.81)
RDW: 15.5 % (ref 11.5–15.5)
WBC: 10.3 10*3/uL (ref 4.0–10.5)
nRBC: 0 % (ref 0.0–0.2)

## 2023-05-22 LAB — RAD ONC ARIA SESSION SUMMARY
Course Elapsed Days: 0
Plan Fractions Treated to Date: 1
Plan Prescribed Dose Per Fraction: 1.8 Gy
Plan Total Fractions Prescribed: 25
Plan Total Prescribed Dose: 45 Gy
Reference Point Dosage Given to Date: 1.8 Gy
Reference Point Session Dosage Given: 1.8 Gy
Session Number: 1

## 2023-05-22 LAB — CBC WITH DIFFERENTIAL (CANCER CENTER ONLY)
Abs Immature Granulocytes: 0.18 10*3/uL — ABNORMAL HIGH (ref 0.00–0.07)
Basophils Absolute: 0.1 10*3/uL (ref 0.0–0.1)
Basophils Relative: 1 %
Eosinophils Absolute: 0.1 10*3/uL (ref 0.0–0.5)
Eosinophils Relative: 1 %
HCT: 46.6 % (ref 39.0–52.0)
Hemoglobin: 15.2 g/dL (ref 13.0–17.0)
Immature Granulocytes: 2 %
Lymphocytes Relative: 19 %
Lymphs Abs: 2.1 10*3/uL (ref 0.7–4.0)
MCH: 29.6 pg (ref 26.0–34.0)
MCHC: 32.6 g/dL (ref 30.0–36.0)
MCV: 90.7 fL (ref 80.0–100.0)
Monocytes Absolute: 1.1 10*3/uL — ABNORMAL HIGH (ref 0.1–1.0)
Monocytes Relative: 10 %
Neutro Abs: 7.9 10*3/uL — ABNORMAL HIGH (ref 1.7–7.7)
Neutrophils Relative %: 67 %
Platelet Count: 268 10*3/uL (ref 150–400)
RBC: 5.14 MIL/uL (ref 4.22–5.81)
RDW: 15.6 % — ABNORMAL HIGH (ref 11.5–15.5)
WBC Count: 11.5 10*3/uL — ABNORMAL HIGH (ref 4.0–10.5)
nRBC: 0 % (ref 0.0–0.2)

## 2023-05-22 LAB — CMP (CANCER CENTER ONLY)
ALT: 14 U/L (ref 0–44)
AST: 17 U/L (ref 15–41)
Albumin: 3.4 g/dL — ABNORMAL LOW (ref 3.5–5.0)
Alkaline Phosphatase: 64 U/L (ref 38–126)
Anion gap: 7 (ref 5–15)
BUN: 25 mg/dL — ABNORMAL HIGH (ref 8–23)
CO2: 26 mmol/L (ref 22–32)
Calcium: 8.9 mg/dL (ref 8.9–10.3)
Chloride: 106 mmol/L (ref 98–111)
Creatinine: 0.68 mg/dL (ref 0.61–1.24)
GFR, Estimated: >60 mL/min (ref >60)
Glucose, Bld: 168 mg/dL — ABNORMAL HIGH (ref 70–99)
Potassium: 4 mmol/L (ref 3.5–5.1)
Sodium: 139 mmol/L (ref 135–145)
Total Bilirubin: 0.8 mg/dL (ref <1.2)
Total Protein: 6.3 g/dL — ABNORMAL LOW (ref 6.5–8.1)

## 2023-05-22 LAB — URINALYSIS, ROUTINE W REFLEX MICROSCOPIC
Bilirubin Urine: NEGATIVE
Glucose, UA: NEGATIVE mg/dL
Hgb urine dipstick: NEGATIVE
Ketones, ur: NEGATIVE mg/dL
Leukocytes,Ua: NEGATIVE
Nitrite: NEGATIVE
Protein, ur: NEGATIVE mg/dL
Specific Gravity, Urine: >1.046 — ABNORMAL HIGH (ref 1.005–1.030)
pH: 7 (ref 5.0–8.0)

## 2023-05-22 LAB — LIPASE, BLOOD: Lipase: 28 U/L (ref 11–51)

## 2023-05-22 LAB — MAGNESIUM: Magnesium: 2.4 mg/dL (ref 1.7–2.4)

## 2023-05-22 MED ORDER — DEXAMETHASONE SODIUM PHOSPHATE 10 MG/ML IJ SOLN
10.0000 mg | Freq: Once | INTRAMUSCULAR | Status: AC
  Administered 2023-05-22: 10 mg via INTRAVENOUS
  Filled 2023-05-22: qty 1

## 2023-05-22 MED ORDER — ACETAMINOPHEN 325 MG PO TABS: 650.0000 mg | ORAL_TABLET | Freq: Four times a day (QID) | ORAL | Status: DC | PRN

## 2023-05-22 MED ORDER — SODIUM CHLORIDE 0.9 % IV SOLN
184.2000 mg | Freq: Once | INTRAVENOUS | Status: AC
  Administered 2023-05-22: 180 mg via INTRAVENOUS
  Filled 2023-05-22: qty 18

## 2023-05-22 MED ORDER — PALONOSETRON HCL INJECTION 0.25 MG/5ML
0.2500 mg | Freq: Once | INTRAVENOUS | Status: AC
  Administered 2023-05-22: 0.25 mg via INTRAVENOUS
  Filled 2023-05-22: qty 5

## 2023-05-22 MED ORDER — CETIRIZINE HCL 10 MG/ML IV SOLN
10.0000 mg | Freq: Once | INTRAVENOUS | Status: AC
  Administered 2023-05-22: 10 mg via INTRAVENOUS
  Filled 2023-05-22: qty 1

## 2023-05-22 MED ORDER — SODIUM CHLORIDE 0.9 % IV SOLN: INTRAVENOUS | Status: AC

## 2023-05-22 MED ORDER — ONDANSETRON HCL 4 MG/2ML IJ SOLN: 4.0000 mg | Freq: Four times a day (QID) | INTRAMUSCULAR | Status: DC | PRN

## 2023-05-22 MED ORDER — MELATONIN 3 MG PO TABS: 3.0000 mg | ORAL_TABLET | Freq: Every evening | ORAL | Status: DC | PRN

## 2023-05-22 MED ORDER — SODIUM CHLORIDE 0.9 % IV SOLN: INTRAVENOUS | Status: DC

## 2023-05-22 MED ORDER — IOHEXOL 300 MG/ML  SOLN
100.0000 mL | Freq: Once | INTRAMUSCULAR | Status: AC | PRN
  Administered 2023-05-22: 100 mL via INTRAVENOUS

## 2023-05-22 MED ORDER — FAMOTIDINE IN NACL 20-0.9 MG/50ML-% IV SOLN
20.0000 mg | Freq: Once | INTRAVENOUS | Status: AC
  Administered 2023-05-22: 20 mg via INTRAVENOUS
  Filled 2023-05-22: qty 50

## 2023-05-22 MED ORDER — LORATADINE 10 MG PO TABS: 10.0000 mg | ORAL_TABLET | Freq: Every day | ORAL | Status: DC

## 2023-05-22 MED ORDER — PACLITAXEL CHEMO INJECTION 300 MG/50ML
50.0000 mg/m2 | Freq: Once | INTRAVENOUS | Status: AC
  Administered 2023-05-22: 96 mg via INTRAVENOUS
  Filled 2023-05-22: qty 16

## 2023-05-22 MED ORDER — ACETAMINOPHEN 650 MG RE SUPP: 650.0000 mg | Freq: Four times a day (QID) | RECTAL | Status: DC | PRN

## 2023-05-22 NOTE — H&P (Signed)
History and Physical      Samuel [name] JKK:[id number] DOB: 1945-12-19 DOA: 05/22/2023; DOS: 05/22/2023  PCP: [name], MD *** Patient coming from: home ***  I have personally briefly reviewed patient's old medical records in [facility] Link  Chief Complaint: ***  HPI: Samuel [name] is a 77 y.o. male with medical history significant for *** who is admitted to [facility] on 05/22/2023 with *** after presenting from home*** to [facility] ED complaining of ***.   ***        ***  ED Course:  Vital signs in the ED were notable for the following: ***  Labs were notable for the following: ***  Per my interpretation, EKG in ED demonstrated the following:  ***  Imaging in the ED, per corresponding formal radiology read, was notable for the following: ***  While in the ED, the following were administered: ***  Subsequently, the patient was admitted  ***  ***red   Review of Systems: As per HPI otherwise 10 point review of systems negative.   Past Medical History:  Diagnosis Date   Diabetes mellitus without complication [facility])     Past Surgical History:  Procedure Laterality Date   BIOPSY  04/18/2023   Procedure: BIOPSY;  Surgeon: Lynann Bologna, DO;  Location: [facility] ENDOSCOPY;  Service: Gastroenterology;;   ESOPHAGOGASTRODUODENOSCOPY N/A 04/18/2023   Procedure: ESOPHAGOGASTRODUODENOSCOPY (EGD);  Surgeon: Lynann Bologna, DO;  Location: [facility] ENDOSCOPY;  Service: Gastroenterology;  Laterality: N/A;   IR GASTROSTOMY TUBE MOD SED  04/20/2023   IR NASO G TUBE PLC W/FL W/RAD  04/20/2023   KNEE ARTHROPLASTY Bilateral     Social History:  reports that he quit smoking about 37 years ago. His smoking use included cigarettes. He started smoking about 52 years ago. He has a 15 pack-year smoking history. He has never used smokeless tobacco. He reports current alcohol use of about 1.0 - 2.0 standard drink of alcohol per week. He reports that he does not use  drugs.   Allergies  Allergen Reactions   Penicillins     Shortness of breath   Sulfa Antibiotics     Blisters    Procaine Other (See Comments)    Caused fatigue    Family History  Problem Relation Age of Onset   Hypertension Father    COPD Father    COPD Sister    Diabetes Maternal Grandfather     Family history reviewed and not pertinent ***   Prior to Admission medications   Medication Sig Start Date End Date Taking? Authorizing Provider  apixaban (ELIQUIS) 5 MG TABS tablet Place 1 tablet (5 mg total) into feeding tube 2 (two) times daily. 04/25/23   Azucena Fallen, MD  Blood Glucose Monitoring Suppl (ACCU-CHEK GUIDE ME) w/Device KIT USE AS DIRECTED 3 TIMES A DAY 01/16/23   [name], MD  dexamethasone (DECADRON) 4 MG tablet Take 2 tablets daily for 2 days, start the day after chemotherapy. Take with food. 05/17/23   [name], MD  glucose blood (ACCU-CHEK GUIDE) test strip Use as instructed daily 12/19/22   [name], MD  lidocaine-prilocaine (EMLA) cream Apply to affected area once 05/17/23   [name], MD  Multiple Vitamins-Minerals (MULTIVITAMIN MEN) TABS Take 1 tablet by mouth daily with breakfast.    [provider]  Nutritional Supplements (FEEDING SUPPLEMENT, OSMOLITE 1.5 CAL,) LIQD Place 237 mLs into feeding tube 6 (six) times daily. 04/25/23 05/25/23  Azucena Fallen, MD  ondansetron (ZOFRAN) 8 MG  tablet Take 1 tablet (8 mg total) by mouth every 8 (eight) hours as needed for nausea or vomiting. Start on the third day after chemotherapy. 05/17/23   [name], MD  ondansetron (ZOFRAN-ODT) 4 MG disintegrating tablet Take 1 tablet (4 mg total) by mouth every 8 (eight) hours as needed for nausea or vomiting. 05/04/23   [name], MD  prochlorperazine (COMPAZINE) 10 MG tablet Take 1 tablet (10 mg total) by mouth every 6 (six) hours as needed for nausea or vomiting. 05/17/23   [name], MD  Water For Irrigation, Sterile (FREE WATER) SOLN Place  120 mLs into feeding tube 6 (six) times daily. 04/25/23   Azucena Fallen, MD     Objective    Physical Exam: Vitals:   05/22/23 1800 05/22/23 1830 05/22/23 1845 05/22/23 2112  BP: 111/69 (!) 136/106 127/83   Pulse: (!) 106 98 (!) 101   Resp: 17 16 20    Temp:    97.7 F (36.5 C)  TempSrc:    Oral  SpO2: 92% 96% 95%   Weight:      Height:        General: appears to be stated age; alert, oriented Skin: warm, dry, no rash Head:  AT/[place] Mouth:  Oral mucosa membranes appear moist, normal dentition Neck: supple; trachea midline Heart:  RRR; did not appreciate any M/R/G Lungs: CTAB, did not appreciate any wheezes, rales, or rhonchi Abdomen: + BS; soft, ND, NT Vascular: 2+ pedal pulses b/l; 2+ radial pulses b/l Extremities: no peripheral edema, no muscle wasting Neuro: strength and sensation intact in upper and lower extremities b/l    *** Neuro: 5/5 strength of the proximal and distal flexors and extensors of the upper and lower extremities bilaterally; sensation intact in upper and lower extremities b/l; cranial nerves II through XII grossly intact; no pronator drift; no evidence suggestive of slurred speech, dysarthria, or facial droop; Normal muscle tone. No tremors. *** Neuro: In the setting of the patient's current mental status and associated inability to follow instructions, unable to perform full neurologic exam at this time.  As such, assessment of strength, sensation, and cranial nerves is limited at this time. Patient noted to spontaneously move all 4 extremities. No tremors.  ***    Labs on Admission: I have personally reviewed following labs and imaging studies  CBC: Recent Labs  Lab 05/22/23 0821 05/22/23 1756  WBC 11.5* 10.3  NEUTROABS 7.9* 9.2*  HGB 15.2 15.3  HCT 46.6 45.5  MCV 90.7 90.6  PLT 268 259   Basic Metabolic Panel: Recent Labs  Lab 05/22/23 0821 05/22/23 1756  NA 139 137  K 4.0 4.3  CL 106 107  CO2 26 22  GLUCOSE 168* 183*   BUN 25* 22  CREATININE 0.68 0.64  CALCIUM 8.9 8.4*   GFR: Estimated Creatinine Clearance: 74.8 mL/min (by C-G formula based on SCr of 0.64 mg/dL). Liver Function Tests: Recent Labs  Lab 05/22/23 0821 05/22/23 1756  AST 17 24  ALT 14 19  ALKPHOS 64 63  BILITOT 0.8 1.0  PROT 6.3* 6.3*  ALBUMIN 3.4* 3.1*   Recent Labs  Lab 05/22/23 1756  LIPASE 28   No results for input(s): "AMMONIA" in the last 168 hours. Coagulation Profile: No results for input(s): "INR", "PROTIME" in the last 168 hours. Cardiac Enzymes: No results for input(s): "CKTOTAL", "CKMB", "CKMBINDEX", "TROPONINI" in the last 168 hours. BNP (last 3 results) No results for input(s): "PROBNP" in the last 8760 hours. HbA1C: No results  for input(s): "HGBA1C" in the last 72 hours. CBG: No results for input(s): "GLUCAP" in the last 168 hours. Lipid Profile: No results for input(s): "CHOL", "HDL", "LDLCALC", "TRIG", "CHOLHDL", "LDLDIRECT" in the last 72 hours. Thyroid Function Tests: No results for input(s): "TSH", "T4TOTAL", "FREET4", "T3FREE", "THYROIDAB" in the last 72 hours. Anemia Panel: No results for input(s): "VITAMINB12", "FOLATE", "FERRITIN", "TIBC", "IRON", "RETICCTPCT" in the last 72 hours. Urine analysis:    Component Value Date/Time   COLORURINE YELLOW 05/22/2023 2113   APPEARANCEUR CLEAR 05/22/2023 2113   LABSPEC >1.046 (H) 05/22/2023 2113   PHURINE 7.0 05/22/2023 2113   GLUCOSEU NEGATIVE 05/22/2023 2113   HGBUR NEGATIVE 05/22/2023 2113   BILIRUBINUR NEGATIVE 05/22/2023 2113   KETONESUR NEGATIVE 05/22/2023 2113   PROTEINUR NEGATIVE 05/22/2023 2113   NITRITE NEGATIVE 05/22/2023 2113   LEUKOCYTESUR NEGATIVE 05/22/2023 2113    Radiological Exams on Admission: CT ABDOMEN PELVIS W CONTRAST  Result Date: 05/22/2023 CLINICAL DATA:  Abdominal pain. Pain around G-tube. History of esophageal cancer. EXAM: CT ABDOMEN AND PELVIS WITH CONTRAST TECHNIQUE: Multidetector CT imaging of the abdomen and  pelvis was performed using the standard protocol following bolus administration of intravenous contrast. RADIATION DOSE REDUCTION: This exam was performed according to the departmental dose-optimization program which includes automated exposure control, adjustment of the mA and/or kV according to patient size and/or use of iterative reconstruction technique. CONTRAST:  OMNIPAQUE IOHEXOL 300 MG/ML  SOLN COMPARISON:  04/19/2023 FINDINGS: Lower chest: Elevation of the left hemidiaphragm. Left base atelectasis. Right lung base clear. No effusions. Heart is normal size. Hepatobiliary: Small gallstone noted, unchanged. No focal hepatic abnormality or biliary ductal dilatation. Pancreas: No focal abnormality or ductal dilatation. Spleen: No focal abnormality.  Normal size. Adrenals/Urinary Tract: Adrenal glands normal. Stable large cyst in the left kidney measuring up to 8 cm. No follow-up imaging recommended. No stones or hydronephrosis. Urinary bladder unremarkable. Stomach/Bowel: Left colonic diverticulosis. No active diverticulitis. Stomach and small bowel decompressed, unremarkable. Normal appendix. Vascular/Lymphatic: Aortic atherosclerosis. No evidence of aneurysm or adenopathy. Reproductive: Prostate enlargement. Other: No free fluid or free air. Musculoskeletal: No acute or focal osseous abnormality. IMPRESSION: Gastrostomy tube within the stomach. No visible complicating feature. Left colonic diverticulosis.  No active diverticulitis. Cholelithiasis, unchanged. Elevation of the left hemidiaphragm.  Left base atelectasis. Prostate enlargement. Aortic atherosclerosis. Electronically Signed   By: Charlett Nose M.D.   On: 05/22/2023 21:15   DG Abdomen 1 View  Result Date: 05/22/2023 CLINICAL DATA:  Evaluate feeding tube EXAM: ABDOMEN - 1 VIEW COMPARISON:  CT 04/19/2023 FINDINGS: Feeding tube projects over the upper abdomen near the midline, exact location difficult to determine on this single view without  contrast. No bowel obstruction, free air or organomegaly. IMPRESSION: Feeding tube projects over the upper abdomen near the midline. No acute findings. Electronically Signed   By: Charlett Nose M.D.   On: 05/22/2023 21:10   DG Chest 2 View  Result Date: 05/22/2023 CLINICAL DATA:  Pain around G-tube. Concern for aspiration. Esophageal cancer EXAM: CHEST - 2 VIEW COMPARISON:  None available FINDINGS: Elevation of the left hemidiaphragm. No confluent airspace opacities or effusions. Heart mediastinal contours within normal limits. No acute bony abnormality. IMPRESSION: No active cardiopulmonary disease. Electronically Signed   By: Charlett Nose M.D.   On: 05/22/2023 21:09      Assessment/Plan    Principal Problem:   Generalized weakness  ***              ***                ***               ***               ***               ***              ***               ***               ***               ***               ***               ***               ***              ***  DVT prophylaxis: SCD's ***  Code Status: Full code*** Family Communication: none*** Disposition Plan: Per Rounding Team Consults called: none***;  Admission status: ***    I SPENT GREATER THAN 75 *** MINUTES IN CLINICAL CARE TIME/MEDICAL DECISION-MAKING IN COMPLETING THIS ADMISSION.     Chaney Born Arshdeep Bolger DO [facility] From 7PM - 7AM   05/22/2023, 9:47 PM   ***

## 2023-05-22 NOTE — ED Provider Notes (Signed)
[place] EMERGENCY DEPARTMENT AT [facility] Provider Note   CSN: [id number] Arrival date & time: 05/22/23  1703     History  Chief Complaint  Patient presents with   Abdominal Pain    Samuel [name] is a 77 y.o. male.  Patient here with abdominal pain.  He is undergoing treatment for esophageal cancer currently with radiation and chemotherapy.  He is having pain in the abdomen around his G-tube but also diffusely.  He is doing feeds bolus through his feeding tube.  He had some discomfort while doing this earlier today.  He had his treatment today.  He overall he has been handling things well given all the significant processes here the last few weeks.  He wanted to make sure his feeding tube is doing okay and his bowel was doing okay.  He does admit to some constipation but he feels like he has been doing well up medications to help with that as well.  He has been dealing with a lot of saliva issues and mucus issues but feels comfortable with the plan that his oncologist has provided as well.  He denies any fever or chills otherwise.  The history is provided by the patient.       Home Medications Prior to Admission medications   Medication Sig Start Date End Date Taking? Authorizing Provider  apixaban (ELIQUIS) 5 MG TABS tablet Place 1 tablet (5 mg total) into feeding tube 2 (two) times daily. 04/25/23   Azucena Fallen, MD  Blood Glucose Monitoring Suppl (ACCU-CHEK GUIDE ME) w/Device KIT USE AS DIRECTED 3 TIMES A DAY 01/16/23   [name], MD  dexamethasone (DECADRON) 4 MG tablet Take 2 tablets daily for 2 days, start the day after chemotherapy. Take with food. 05/17/23   [name], MD  glucose blood (ACCU-CHEK GUIDE) test strip Use as instructed daily 12/19/22   [name], MD  lidocaine-prilocaine (EMLA) cream Apply to affected area once 05/17/23   [name], MD  Multiple Vitamins-Minerals (MULTIVITAMIN MEN) TABS Take 1 tablet by mouth daily with breakfast.     [provider]  Nutritional Supplements (FEEDING SUPPLEMENT, OSMOLITE 1.5 CAL,) LIQD Place 237 mLs into feeding tube 6 (six) times daily. 04/25/23 05/25/23  Azucena Fallen, MD  ondansetron (ZOFRAN) 8 MG tablet Take 1 tablet (8 mg total) by mouth every 8 (eight) hours as needed for nausea or vomiting. Start on the third day after chemotherapy. 05/17/23   [name], MD  ondansetron (ZOFRAN-ODT) 4 MG disintegrating tablet Take 1 tablet (4 mg total) by mouth every 8 (eight) hours as needed for nausea or vomiting. 05/04/23   [name], MD  prochlorperazine (COMPAZINE) 10 MG tablet Take 1 tablet (10 mg total) by mouth every 6 (six) hours as needed for nausea or vomiting. 05/17/23   [name], MD  Water For Irrigation, Sterile (FREE WATER) SOLN Place 120 mLs into feeding tube 6 (six) times daily. 04/25/23   Azucena Fallen, MD      Allergies    Penicillins, Sulfa antibiotics, and Procaine    Review of Systems   Review of Systems  Physical Exam Updated Vital Signs BP 127/83   Pulse (!) 101   Temp 97.7 F (36.5 C) (Oral)   Resp 20   Ht 5\' 8"  (1.727 m)   Wt 78.4 kg   SpO2 95%   BMI 26.28 kg/m  Physical Exam Vitals and nursing note reviewed.  Constitutional:      General: He  is not in acute distress.    Appearance: He is well-developed. He is not ill-appearing.  HENT:     Head: Normocephalic and atraumatic.  Eyes:     Extraocular Movements: Extraocular movements intact.     Conjunctiva/sclera: Conjunctivae normal.  Cardiovascular:     Rate and Rhythm: Normal rate and regular rhythm.     Heart sounds: Normal heart sounds. No murmur heard. Pulmonary:     Effort: Pulmonary effort is normal. No respiratory distress.     Breath sounds: Normal breath sounds.  Abdominal:     Palpations: Abdomen is soft.     Tenderness: There is generalized abdominal tenderness.  Musculoskeletal:        General: No swelling.     Cervical back: Neck supple.  Skin:    General: Skin  is warm and dry.     Capillary Refill: Capillary refill takes less than 2 seconds.  Neurological:     Mental Status: He is alert.  Psychiatric:        Mood and Affect: Mood normal.     ED Results / Procedures / Treatments   Labs (all labs ordered are listed, but only abnormal results are displayed) Labs Reviewed  CBC WITH DIFFERENTIAL/PLATELET - Abnormal; Notable for the following components:      Result Value   Neutro Abs 9.2 (*)    Abs Immature Granulocytes 0.24 (*)    All other components within normal limits  COMPREHENSIVE METABOLIC PANEL - Abnormal; Notable for the following components:   Glucose, Bld 183 (*)    Calcium 8.4 (*)    Total Protein 6.3 (*)    Albumin 3.1 (*)    All other components within normal limits  LIPASE, BLOOD  URINALYSIS, ROUTINE W REFLEX MICROSCOPIC    EKG None  Radiology CT ABDOMEN PELVIS W CONTRAST  Result Date: 05/22/2023 CLINICAL DATA:  Abdominal pain. Pain around G-tube. History of esophageal cancer. EXAM: CT ABDOMEN AND PELVIS WITH CONTRAST TECHNIQUE: Multidetector CT imaging of the abdomen and pelvis was performed using the standard protocol following bolus administration of intravenous contrast. RADIATION DOSE REDUCTION: This exam was performed according to the departmental dose-optimization program which includes automated exposure control, adjustment of the mA and/or kV according to patient size and/or use of iterative reconstruction technique. CONTRAST:  OMNIPAQUE IOHEXOL 300 MG/ML  SOLN COMPARISON:  04/19/2023 FINDINGS: Lower chest: Elevation of the left hemidiaphragm. Left base atelectasis. Right lung base clear. No effusions. Heart is normal size. Hepatobiliary: Small gallstone noted, unchanged. No focal hepatic abnormality or biliary ductal dilatation. Pancreas: No focal abnormality or ductal dilatation. Spleen: No focal abnormality.  Normal size. Adrenals/Urinary Tract: Adrenal glands normal. Stable large cyst in the left kidney  measuring up to 8 cm. No follow-up imaging recommended. No stones or hydronephrosis. Urinary bladder unremarkable. Stomach/Bowel: Left colonic diverticulosis. No active diverticulitis. Stomach and small bowel decompressed, unremarkable. Normal appendix. Vascular/Lymphatic: Aortic atherosclerosis. No evidence of aneurysm or adenopathy. Reproductive: Prostate enlargement. Other: No free fluid or free air. Musculoskeletal: No acute or focal osseous abnormality. IMPRESSION: Gastrostomy tube within the stomach. No visible complicating feature. Left colonic diverticulosis.  No active diverticulitis. Cholelithiasis, unchanged. Elevation of the left hemidiaphragm.  Left base atelectasis. Prostate enlargement. Aortic atherosclerosis. Electronically Signed   By: Charlett Nose M.D.   On: 05/22/2023 21:15   DG Abdomen 1 View  Result Date: 05/22/2023 CLINICAL DATA:  Evaluate feeding tube EXAM: ABDOMEN - 1 VIEW COMPARISON:  CT 04/19/2023 FINDINGS: Feeding tube projects over the  upper abdomen near the midline, exact location difficult to determine on this single view without contrast. No bowel obstruction, free air or organomegaly. IMPRESSION: Feeding tube projects over the upper abdomen near the midline. No acute findings. Electronically Signed   By: Charlett Nose M.D.   On: 05/22/2023 21:10   DG Chest 2 View  Result Date: 05/22/2023 CLINICAL DATA:  Pain around G-tube. Concern for aspiration. Esophageal cancer EXAM: CHEST - 2 VIEW COMPARISON:  None available FINDINGS: Elevation of the left hemidiaphragm. No confluent airspace opacities or effusions. Heart mediastinal contours within normal limits. No acute bony abnormality. IMPRESSION: No active cardiopulmonary disease. Electronically Signed   By: Charlett Nose M.D.   On: 05/22/2023 21:09    Procedures Procedures    Medications Ordered in ED Medications  iohexol (OMNIPAQUE) 300 MG/ML solution 100 mL (100 mLs Intravenous Contrast Given 05/22/23 1819)    ED  Course/ Medical Decision Making/ A&P                                 Medical Decision Making Amount and/or Complexity of Data Reviewed Labs: ordered. Radiology: ordered.  Risk Prescription drug management. Decision regarding hospitalization.   Shawna Frieders is here for abdominal pain.  Currently undergoing treatment with radiation and chemo for esophageal cancer.  He has been n.p.o. during this time using feeding tube.  He felt like he had some issues doing a bolus feed this afternoon.  Feels abdominal pain diffusely and was concerned and wanted to be evaluated.  He is tender on his abdominal exam but I do not appreciate any peritonitis.  Vital signs are unremarkable.  Lab work done outpatient today was unremarkable.  Looks like his feeding tube is in place I see some gastric contents in the tube as well but will get a KUB with some contrast to confirm placement and that there is no issues with that as well as get a CT scan abdomen pelvis to evaluate for any colitis or obstruction or ileus.  Will get a chest x-ray to make sure there is no aspiration pneumonia check basic labs again.  Overall he has declined pain meds.  Overall lab work is unremarkable per my review and interpretation.  CT images unremarkable NG tube appears to be functioning well.  Overall patient started chemotherapy and radiation today.  He is feeling overwhelmed and cannot manage giving him his tube feeds and medications at home.  He does not feel like he has the support at home to take care of himself right now.  Overall I have talked with Dr. Mosetta Putt with oncology and I think that admitted him overnight to try to get some more education about how to take care of himself at home would be beneficial.  He is also having a lot of discomfort with his secretions as well.  Overall we will admit to hospitalist for further support.  This chart was dictated using voice recognition software.  Despite best efforts to proofread,  errors can  occur which can change the documentation meaning.         Final Clinical Impression(s) / ED Diagnoses Final diagnoses:  Generalized abdominal pain  Malignant neoplasm of esophagus, unspecified location [facility])    Rx / DC Orders ED Discharge Orders     None         Virgina Norfolk, DO 05/22/23 2125

## 2023-05-22 NOTE — ED Triage Notes (Signed)
BIB EMS due to pain around G-Tube. Pt has radiation/chemo daily, last received today. Pt has esophogeal ca.118/76-110-91% RA

## 2023-05-22 NOTE — ED Notes (Signed)
Assisted patient with his feeding tube, feeding and flushing with water. JRPRN

## 2023-05-22 NOTE — Patient Instructions (Signed)
 [facility] - A DEPT OF [facility]  Discharge Instructions: Thank you for choosing [facility] to provide your oncology and hematology care.   If you have a lab appointment with the Cancer Center, please go directly to the Cancer Center and check in at the registration area.   Wear comfortable clothing and clothing appropriate for easy access to any Portacath or PICC line.   We strive to give you quality time with your provider. You may need to reschedule your appointment if you arrive late (15 or more minutes).  Arriving late affects you and other patients whose appointments are after yours.  Also, if you miss three or more appointments without notifying the office, you may be dismissed from the clinic at the provider's discretion.      For prescription refill requests, have your pharmacy contact our office and allow 72 hours for refills to be completed.    Today you received the following chemotherapy and/or immunotherapy agents paclitaxel carboplatin      To help prevent nausea and vomiting after your treatment, we encourage you to take your nausea medication as directed.  BELOW ARE SYMPTOMS THAT SHOULD BE REPORTED IMMEDIATELY: *FEVER GREATER THAN 100.4 F (38 C) OR HIGHER *CHILLS OR SWEATING *NAUSEA AND VOMITING THAT IS NOT CONTROLLED WITH YOUR NAUSEA MEDICATION *UNUSUAL SHORTNESS OF BREATH *UNUSUAL BRUISING OR BLEEDING *URINARY PROBLEMS (pain or burning when urinating, or frequent urination) *BOWEL PROBLEMS (unusual diarrhea, constipation, pain near the anus) TENDERNESS IN MOUTH AND THROAT WITH OR WITHOUT PRESENCE OF ULCERS (sore throat, sores in mouth, or a toothache) UNUSUAL RASH, SWELLING OR PAIN  UNUSUAL VAGINAL DISCHARGE OR ITCHING   Items with * indicate a potential emergency and should be followed up as soon as possible or go to the Emergency Department if any problems should occur.  Please show the CHEMOTHERAPY ALERT CARD or  IMMUNOTHERAPY ALERT CARD at check-in to the Emergency Department and triage nurse.  Should you have questions after your visit or need to cancel or reschedule your appointment, please contact [facility] - A DEPT OF [facility]  Dept: [phone]  and follow the prompts.  Office hours are 8:00 a.m. to 4:30 p.m. Monday - Friday. Please note that voicemails left after 4:00 p.m. may not be returned until the following business day.  We are closed weekends and major holidays. You have access to a nurse at all times for urgent questions. Please call the main number to the clinic Dept: [phone] and follow the prompts.   For any non-urgent questions, you may also contact your provider using MyChart. We now offer e-Visits for anyone 61 and older to request care online for non-urgent symptoms. For details visit [email or web address].   Also download the MyChart app! Go to the app store, search "MyChart", open the app, select , and log in with your MyChart username and password.

## 2023-05-22 NOTE — Progress Notes (Signed)
[facility]   [phone] Fax:([phone]) [phone]   Clinic Follow up Note   Patient Care Team: [name], MD as PCP - General (Family Medicine) [name], MD as PCP - Cardiology (Cardiology) [name], MD as Consulting Physician (Hematology and Oncology)  Date of Service:  05/22/2023  CHIEF COMPLAINT: f/u of esophageal cancer  CURRENT THERAPY:  Concurrent chemoradiation with weekly carboplatin and paclitaxel  Oncology History   Malignant neoplasm of esophagus (HCC) -cTxN0M0 -Patient presented to hospital with progressive dysphagia and weight loss. -EGD showed a malignant appearing severe stenosis 30 to 34 cm from incisors.  The stenosis measured 4 cm, the scope was not able to pass.  Biopsy confirmed adenocarcinoma -He underwent PEG feeding tube for nutrition -PET scan showed hypermetabolic lesion in the distal esophagus, no nodal or distant metastasis.   -pt was evaluated by cardiothoracic surgeon Dr. Cliffton Asters, he is not a good candidate for surgery, but will re-evaluate him after chemoradiation -Plan to start concurrent chemoradiation with weekly carbo platinum and Taxol today 05/22/23   Assessment and Plan    Esophageal Cancer 77 year old with esophageal cancer, starting chemotherapy and radiation today. Managing bolus feedings via syringe, advised by nutritionist. Significant saliva and mucus buildup managed with suction tube and cold water. Concerned about side effects of combined chemo and radiation, including fatigue and sleep disturbances. Prefers treatment to potentially reduce tumor and return to normal life, acknowledging potential permanent dietary changes. - Administer chemotherapy at 9:30 AM - Administer radiation therapy at 1:00 PM - Evaluate weekly -I again reviewed potential side effect and the management, especially for fatigue, nutritional intake, pain, and fever - Instruct to contact clinic if experiencing significant  issues  Nutritional Support On regimen of 1.5 cartons of nutritional supplement four times daily, plus protein source. Reports good energy and hunger management. Concerned about self-feeding if incapacitated by treatment side effects. - Continue current nutritional regimen - Monitor ability to self-feed; consider direct admission if unable to manage at home  Saliva and Mucus Management Significant saliva and mucus buildup managed with suction tube and cold water, providing relief for 8-10 hours. - Continue using suction tube and cold water  General Health Maintenance Supervises home activities, ensures twins take medications, manages own laundry. Sleeps in medical bed with head elevation, beneficial for condition. - Continue current home management routine - Ensure head is elevated during sleep  Follow-up -Lab reviewed, adequate for treatment, will proceed for cycle carboplatin and Taxol today, and continue weekly - schedule weekly lab and follow-up appointments for evaluation.         SUMMARY OF ONCOLOGIC HISTORY: Oncology History  Malignant neoplasm of esophagus (HCC)  04/18/2023 Cancer Staging   Staging form: Esophagus - Adenocarcinoma, AJCC 8th Edition - Clinical stage from 04/18/2023: Stage Unknown (cTX, cN0, cM0) - Signed by [name], MD on 05/04/2023 Total positive nodes: 0   04/20/2023 Initial Diagnosis   Malignant neoplasm of lower third of esophagus (HCC)   05/22/2023 -  Chemotherapy   Patient is on Treatment Plan : ESOPHAGUS Carboplatin + Paclitaxel Weekly X 6 Weeks with XRT        Discussed the use of AI scribe software for clinical note transcription with the patient, who gave verbal consent to proceed.  History of Present Illness   The patient, a 77 year old male with esophageal cancer, is scheduled to start chemo and radiation today. He reports doing well at home, having reduced his load to a supervisory capacity. He is getting  good sleep and managing his  feeding through a syringe. He is following a nutritionist's advice, taking one and a half cartons four times per day plus a protein source, which has improved his energy levels and satiated his hunger.  However, he is experiencing issues with saliva and mucus build-up due to blockage in his esophagus. He uses a suction tube to manage this, needing to regurgitate the build-up every eight to ten hours. He finds that very cold or ice water helps draw the mucus and saliva.  He is also getting quite a bit of exercise, being up early in the morning and puttering about. He is taking care of twins in a supervisory mode, with his wife and one of the twins helping with household chores. His wife is currently a visitor from the [place] of [place], undergoing immigration procedures with the help of an attorney.  The patient is also concerned about potential fatigue from the upcoming chemo and radiation treatment, which might incapacitate him and prevent him from feeding himself or taking care of himself.         All other systems were reviewed with the patient and are negative.  MEDICAL HISTORY:  Past Medical History:  Diagnosis Date   Diabetes mellitus without complication [facility])     SURGICAL HISTORY: Past Surgical History:  Procedure Laterality Date   BIOPSY  04/18/2023   Procedure: BIOPSY;  Surgeon: Lynann Bologna, DO;  Location: [facility] ENDOSCOPY;  Service: Gastroenterology;;   ESOPHAGOGASTRODUODENOSCOPY N/A 04/18/2023   Procedure: ESOPHAGOGASTRODUODENOSCOPY (EGD);  Surgeon: Lynann Bologna, DO;  Location: [facility] ENDOSCOPY;  Service: Gastroenterology;  Laterality: N/A;   IR GASTROSTOMY TUBE MOD SED  04/20/2023   IR NASO G TUBE PLC W/FL W/RAD  04/20/2023   KNEE ARTHROPLASTY Bilateral     I have reviewed the social history and family history with the patient and they are unchanged from previous note.  ALLERGIES:  is allergic to penicillins, sulfa antibiotics, and  procaine.  MEDICATIONS:  Current Outpatient Medications  Medication Sig Dispense Refill   apixaban (ELIQUIS) 5 MG TABS tablet Place 1 tablet (5 mg total) into feeding tube 2 (two) times daily. 60 tablet 0   Blood Glucose Monitoring Suppl (ACCU-CHEK GUIDE ME) w/Device KIT USE AS DIRECTED 3 TIMES A DAY 1 kit 0   dexamethasone (DECADRON) 4 MG tablet Take 2 tablets daily for 2 days, start the day after chemotherapy. Take with food. 30 tablet 1   glucose blood (ACCU-CHEK GUIDE) test strip Use as instructed daily 100 each 12   lidocaine-prilocaine (EMLA) cream Apply to affected area once 30 g 3   Multiple Vitamins-Minerals (MULTIVITAMIN MEN) TABS Take 1 tablet by mouth daily with breakfast.     Nutritional Supplements (FEEDING SUPPLEMENT, OSMOLITE 1.5 CAL,) LIQD Place 237 mLs into feeding tube 6 (six) times daily. 42660 mL 0   ondansetron (ZOFRAN) 8 MG tablet Take 1 tablet (8 mg total) by mouth every 8 (eight) hours as needed for nausea or vomiting. Start on the third day after chemotherapy. 30 tablet 1   ondansetron (ZOFRAN-ODT) 4 MG disintegrating tablet Take 1 tablet (4 mg total) by mouth every 8 (eight) hours as needed for nausea or vomiting. 20 tablet 0   prochlorperazine (COMPAZINE) 10 MG tablet Take 1 tablet (10 mg total) by mouth every 6 (six) hours as needed for nausea or vomiting. 30 tablet 1   Water For Irrigation, Sterile (FREE WATER) SOLN Place 120 mLs into feeding tube 6 (six) times daily. 24000  mL 0   No current facility-administered medications for this visit.    PHYSICAL EXAMINATION: ECOG PERFORMANCE STATUS: 1 - Symptomatic but completely ambulatory  Vitals:   05/22/23 0851  BP: 127/83  Pulse: (!) 101  Resp: 18  Temp: 98.3 F (36.8 C)  SpO2: 93%   Wt Readings from Last 3 Encounters:  05/22/23 172 lb 14.4 oz (78.4 kg)  05/19/23 176 lb (79.8 kg)  05/09/23 171 lb (77.6 kg)     GENERAL:alert, no distress and comfortable SKIN: skin color, texture, turgor are normal, no  rashes or significant lesions EYES: normal, Conjunctiva are pink and non-injected, sclera clear NECK: supple, thyroid normal size, non-tender, without nodularity LYMPH:  no palpable lymphadenopathy in the cervical, axillary  LUNGS: clear to auscultation and percussion with normal breathing effort HEART: regular rate & rhythm and no murmurs and no lower extremity edema ABDOMEN:abdomen soft, non-tender and normal bowel sounds Musculoskeletal:no cyanosis of digits and no clubbing  NEURO: alert & oriented x 3 with fluent speech, no focal motor/sensory deficits  Physical Exam   MEASUREMENTS: WT- 177 pounds      LABORATORY DATA:  I have reviewed the data as listed    Latest Ref Rng & Units 05/22/2023    8:21 AM 05/04/2023   12:10 PM 04/25/2023    5:07 AM  CBC  WBC 4.0 - 10.5 K/uL 11.5  10.4  10.3   Hemoglobin 13.0 - 17.0 g/dL 16.1  09.6  04.5   Hematocrit 39.0 - 52.0 % 46.6  49.3  46.9   Platelets 150 - 400 K/uL 268  290  206         Latest Ref Rng & Units 05/22/2023    8:21 AM 05/04/2023   12:10 PM 04/20/2023    3:15 AM  CMP  Glucose 70 - 99 mg/dL 409  811  83   BUN 8 - 23 mg/dL 25  17  8    Creatinine 0.61 - 1.24 mg/dL 9.14  7.82  9.56   Sodium 135 - 145 mmol/L 139  139  144   Potassium 3.5 - 5.1 mmol/L 4.0  4.6  3.5   Chloride 98 - 111 mmol/L 106  102  108   CO2 22 - 32 mmol/L 26  31  23    Calcium 8.9 - 10.3 mg/dL 8.9  9.5  9.1   Total Protein 6.5 - 8.1 g/dL 6.3  6.9  4.9   Total Bilirubin <1.2 mg/dL 0.8  0.6  1.2   Alkaline Phos 38 - 126 U/L 64  100  46   AST 15 - 41 U/L 17  28  37   ALT 0 - 44 U/L 14  35  41       RADIOGRAPHIC STUDIES: I have personally reviewed the radiological images as listed and agreed with the findings in the report. No results found.    No orders of the defined types were placed in this encounter.  All questions were answered. The patient knows to call the clinic with any problems, questions or concerns. No barriers to learning was  detected. The total time spent in the appointment was 30 minutes.     [name], MD 05/22/2023

## 2023-05-23 ENCOUNTER — Encounter (HOSPITAL_COMMUNITY): Payer: Self-pay | Admitting: Internal Medicine

## 2023-05-23 ENCOUNTER — Ambulatory Visit
Admission: RE | Admit: 2023-05-23 | Discharge: 2023-05-23 | Disposition: A | Discharge status: Home / Self Care | Payer: 59 | Source: Ambulatory Visit | Attending: Radiation Oncology

## 2023-05-23 ENCOUNTER — Telehealth: Payer: Self-pay

## 2023-05-23 ENCOUNTER — Other Ambulatory Visit: Payer: Self-pay

## 2023-05-23 DIAGNOSIS — E860 Dehydration: Secondary | ICD-10-CM | POA: Diagnosis not present

## 2023-05-23 DIAGNOSIS — C155 Malignant neoplasm of lower third of esophagus: Secondary | ICD-10-CM | POA: Diagnosis not present

## 2023-05-23 DIAGNOSIS — C159 Malignant neoplasm of esophagus, unspecified: Secondary | ICD-10-CM | POA: Diagnosis not present

## 2023-05-23 DIAGNOSIS — N19 Unspecified kidney failure: Secondary | ICD-10-CM | POA: Diagnosis not present

## 2023-05-23 DIAGNOSIS — Z86711 Personal history of pulmonary embolism: Secondary | ICD-10-CM | POA: Diagnosis present

## 2023-05-23 DIAGNOSIS — E119 Type 2 diabetes mellitus without complications: Secondary | ICD-10-CM | POA: Diagnosis not present

## 2023-05-23 DIAGNOSIS — I5032 Chronic diastolic (congestive) heart failure: Secondary | ICD-10-CM | POA: Diagnosis not present

## 2023-05-23 DIAGNOSIS — Z510 Encounter for antineoplastic radiation therapy: Secondary | ICD-10-CM | POA: Diagnosis not present

## 2023-05-23 DIAGNOSIS — R531 Weakness: Secondary | ICD-10-CM | POA: Diagnosis not present

## 2023-05-23 LAB — HEMOGLOBIN A1C
Hgb A1c MFr Bld: 6.4 % — ABNORMAL HIGH (ref 4.8–5.6)
Mean Plasma Glucose: 136.98 mg/dL

## 2023-05-23 LAB — COMPREHENSIVE METABOLIC PANEL
ALT: 18 U/L (ref 0–44)
AST: 21 U/L (ref 15–41)
Albumin: 2.8 g/dL — ABNORMAL LOW (ref 3.5–5.0)
Alkaline Phosphatase: 57 U/L (ref 38–126)
Anion gap: 8 (ref 5–15)
BUN: 21 mg/dL (ref 8–23)
CO2: 22 mmol/L (ref 22–32)
Calcium: 8.3 mg/dL — ABNORMAL LOW (ref 8.9–10.3)
Chloride: 108 mmol/L (ref 98–111)
Creatinine, Ser: 0.53 mg/dL — ABNORMAL LOW (ref 0.61–1.24)
GFR, Estimated: >60 mL/min (ref >60)
Glucose, Bld: 97 mg/dL (ref 70–99)
Potassium: 4.2 mmol/L (ref 3.5–5.1)
Sodium: 138 mmol/L (ref 135–145)
Total Bilirubin: 0.6 mg/dL (ref <1.2)
Total Protein: 5.9 g/dL — ABNORMAL LOW (ref 6.5–8.1)

## 2023-05-23 LAB — RAD ONC ARIA SESSION SUMMARY
Course Elapsed Days: 1
Plan Fractions Treated to Date: 2
Plan Prescribed Dose Per Fraction: 1.8 Gy
Plan Total Fractions Prescribed: 25
Plan Total Prescribed Dose: 45 Gy
Reference Point Dosage Given to Date: 3.6 Gy
Reference Point Session Dosage Given: 1.8 Gy
Session Number: 2

## 2023-05-23 LAB — CBC WITH DIFFERENTIAL/PLATELET
Abs Immature Granulocytes: 0.34 10*3/uL — ABNORMAL HIGH (ref 0.00–0.07)
Basophils Absolute: 0 10*3/uL (ref 0.0–0.1)
Basophils Relative: 0 %
Eosinophils Absolute: 0 10*3/uL (ref 0.0–0.5)
Eosinophils Relative: 0 %
HCT: 47 % (ref 39.0–52.0)
Hemoglobin: 15 g/dL (ref 13.0–17.0)
Immature Granulocytes: 3 %
Lymphocytes Relative: 13 %
Lymphs Abs: 1.6 10*3/uL (ref 0.7–4.0)
MCH: 29.7 pg (ref 26.0–34.0)
MCHC: 31.9 g/dL (ref 30.0–36.0)
MCV: 93.1 fL (ref 80.0–100.0)
Monocytes Absolute: 0.8 10*3/uL (ref 0.1–1.0)
Monocytes Relative: 6 %
Neutro Abs: 9.8 10*3/uL — ABNORMAL HIGH (ref 1.7–7.7)
Neutrophils Relative %: 78 %
Platelets: 257 10*3/uL (ref 150–400)
RBC: 5.05 MIL/uL (ref 4.22–5.81)
RDW: 15.5 % (ref 11.5–15.5)
WBC: 12.6 10*3/uL — ABNORMAL HIGH (ref 4.0–10.5)
nRBC: 0 % (ref 0.0–0.2)

## 2023-05-23 LAB — MAGNESIUM: Magnesium: 2.5 mg/dL — ABNORMAL HIGH (ref 1.7–2.4)

## 2023-05-23 LAB — GLUCOSE, CAPILLARY
Glucose-Capillary: 153 mg/dL — ABNORMAL HIGH (ref 70–99)
Glucose-Capillary: 193 mg/dL — ABNORMAL HIGH (ref 70–99)
Glucose-Capillary: 105 mg/dL — ABNORMAL HIGH (ref 70–99)
Glucose-Capillary: 129 mg/dL — ABNORMAL HIGH (ref 70–99)

## 2023-05-23 LAB — VITAMIN B12: Vitamin B-12: 296 pg/mL (ref 180–914)

## 2023-05-23 LAB — TSH: TSH: 0.532 u[IU]/mL (ref 0.350–4.500)

## 2023-05-23 MED ORDER — FREE WATER
120.0000 mL | Freq: Every day | Status: DC
  Administered 2023-05-23 – 2023-05-24 (×8): 120 mL

## 2023-05-23 MED ORDER — ORAL CARE MOUTH RINSE
15.0000 mL | OROMUCOSAL | Status: DC
  Administered 2023-05-23 – 2023-05-24 (×6): 15 mL via OROMUCOSAL

## 2023-05-23 MED ORDER — APIXABAN 5 MG PO TABS
5.0000 mg | ORAL_TABLET | Freq: Two times a day (BID) | ORAL | Status: DC
  Administered 2023-05-23 – 2023-05-24 (×3): 5 mg
  Filled 2023-05-23 (×3): qty 1

## 2023-05-23 MED ORDER — ORAL CARE MOUTH RINSE: 15.0000 mL | OROMUCOSAL | Status: DC | PRN

## 2023-05-23 MED ORDER — SODIUM CHLORIDE 0.9 % IV SOLN: INTRAVENOUS | Status: DC

## 2023-05-23 MED ORDER — OSMOLITE 1.5 CAL PO LIQD
237.0000 mL | Freq: Every day | ORAL | Status: DC
  Administered 2023-05-23 (×3): 237 mL
  Filled 2023-05-23 (×5): qty 237

## 2023-05-23 MED ORDER — OSMOLITE 1.5 CAL PO LIQD
1000.0000 mL | ORAL | Status: DC
  Administered 2023-05-23: 1000 mL
  Filled 2023-05-23: qty 1000

## 2023-05-23 MED ORDER — INSULIN ASPART 100 UNIT/ML IJ SOLN
0.0000 [IU] | Freq: Four times a day (QID) | INTRAMUSCULAR | Status: DC
  Administered 2023-05-23 (×2): 1 [IU] via SUBCUTANEOUS

## 2023-05-23 NOTE — Care Management Obs Status (Signed)
MEDICARE OBSERVATION STATUS NOTIFICATION   Patient Details  Name: Samuel [name] MRN: [id number] Date of Birth: October 27, 1945   Medicare Observation Status Notification Given:  Yes    Otelia Santee, LCSW 05/23/2023, 1:41 PM

## 2023-05-23 NOTE — Telephone Encounter (Signed)
Samuel [name] is currently admitted from ED. Pt had spasms and pain in diaphragm and intercostal spaces when trying to give himself his feeding last evening. Went to ED. He is awaiting to be seen by Dr. Mosetta Putt. May be discharged fo SNF.

## 2023-05-23 NOTE — Progress Notes (Signed)
Initial Nutrition Assessment  INTERVENTION:   Transition to night feed regimen tonight at 1800: -Osmolite 1.5 @ 60 ml/hr x 16 hours (1800-1000) via PEG  If tolerates tonight, can go to goal rate the next night (11/20) of: -Osmolite 1.5 @ 100 ml/hr x 16 hours (1800-1000) -Provides 2400 kcals, 100g protein and 1219 ml H2O -Free water flushes: 120 ml every 4 hours (720 ml)  NUTRITION DIAGNOSIS:   Increased nutrient needs related to cancer and cancer related treatments as evidenced by estimated needs.  GOAL:   Patient will meet greater than or equal to 90% of their needs  MONITOR:   Labs, Weight trends, TF tolerance, I & O's  REASON FOR ASSESSMENT:   Consult Enteral/tube feeding initiation and management  ASSESSMENT:   77 y.o. male with medical history significant for esophageal cancer status post G-tube placement, undergoing chemo and radiation, chronic diastolic heart failure, type 2 diabetes mellitus, pulmonary embolism diagnosed in October 2024, on Eliquis, who is admitted to [facility] on 05/22/2023 with generalized weakness PEG placed 04/20/23.  At time of visit, pt very upset about not having water to irrigate his mouth with. It took some time to communicate with patient regarding tube feedings. State he is very concerned about not getting his tube feeding and protein.  Pt reports he was continuing to administer feeds as recommended by [facility] RD, 1.5 cartons Osmolite 1.5 QID. Pt has been tolerating formula but has been having excruciating pain when administering bolus feeds since radiation. Reports having pain in diaphragm and intercostal spaces r/t radiation. Pt unable to administer his feeds himself as it is very painful physically. Pt plans to continue chemo and radiation so suspect this to be a continued issue if not worse.  Requiring assistance from nursing to administer feeds, supplements, medications and free water.  Pt is agreeable to transitioning to  continuous feeding overnight via pump. Alerted Jeri Modena with [employer] of pt's admission and potential change in tube feeding regimen.   Will continue some bolus feeds today so patient doesn't go all day without some nutrition, nursing to administer. Will start night regimen tonight.   Per weight records, pt did lose 37 lbs from 12/15/22 to 04/25/23. Significant weight loss.  Since tube feedings have been started weight has been trending up. Current weight: 179 lbs.   Medications reviewed.  Labs reviewed: CBGs: 153-193 Elevated Mg   NUTRITION - FOCUSED PHYSICAL EXAM:  Will attempt at follow-up  Diet Order:   Diet Order             Diet NPO time specified Except for: Sips with Meds  Diet effective now                   EDUCATION NEEDS:   Education needs have been addressed  Skin:  Skin Assessment: Reviewed RN Assessment  Last BM:  11/17  Height:   Ht Readings from Last 1 Encounters:  05/22/23 5\' 8"  (1.727 m)    Weight:   Wt Readings from Last 1 Encounters:  05/23/23 81.3 kg    BMI:  Body mass index is 27.25 kg/m.  Estimated Nutritional Needs:   Kcal:  2200-2400  Protein:  110-125g  Fluid:  2.2L/day   Tilda Franco, MS, RD, LDN Inpatient Clinical Dietitian Contact information available via Amion

## 2023-05-23 NOTE — Hospital Course (Signed)
Patient is a 77 year old Caucasian male with a past medical history significant for melanoma to esophageal cancer status post G-tube placement who is currently undergoing chemotherapy and radiation, chronic diastolic CHF, diabetes mellitus type 2, pulmonary embolism diagnosed in October 2024 on anticoagulation as well as other comorbidities who presented was along with generalized weakness without any focal deficits.  He states that he recently started chemotherapy and radiation for his esophageal cancer.  Denied any chest pain or shortness of breath or fevers, chills myalgias.  States that he has a G-tube in place and has been n.p.o. otherwise.  No diarrhea.  He came to the ED and was worked up and was admitted for further evaluation of his generalized weakness and dehydration and is slowly improving.  Will continue IV fluid hydration overnight and anticipate discharge in next 24 to 48 hours if stable and PT OT evaluated today.  Assessment and Plan:  Generalized Weakness and Physical Deconditioning -In the absence of any acute focal neurological deficits or focal weakness to suggest CVA -Likely stemming from his chemotherapy and radiation as well as clinical evidence of dehydration -No infectious process noted in WBC likely in setting of dehydration -Chest x-ray showed no evidence of infiltrate and CT of the abdomen pelvis done and showed no evidence of acute intra-abdominal or intrapelvic processes -PT OT evaluated and OT recommending SNF with PT recommending home health -Continue with fall precautions and repeat labs in the a.m. -Resume gentle IV fluids but increase the rate from 50 mL/hr to 75 mL/hr x1 Day -Check TSH and obtain orthostatic vital signs in the a.m. and anticipate discharge in the next 24 to 48 hours of stable  Dehydration -Had dry oral mucous membranes as well as laboratory findings notable for acute prerenal azotemia and UA demonstrating elevated specific gravity -IV fluid  hydration now increased to 75 mL/h and plan for 1 day -Resume home Osmolite bolus feedings and tube feedings per nutritionist -Strict I's and O's and daily weights  Esophageal Cancer and recent abdominal pain -Recently started Chemotherapy and Radiation is currently going on with -CT Abd/Pelvis done and showed G-tube placed within the stomach -Currently NPO but will allow Clears for Water for Irrigation of his throat -C/w G-Tube feeding with Nutritionist recommendations: Per Nutritionist: Transition to night feed regimen tonight at 1800: -Osmolite 1.5 @ 60 ml/hr x 16 hours (1800-1000) via PEG   If tolerates tonight, can go to goal rate the next night (11/20) of: -Osmolite 1.5 @ 100 ml/hr x 16 hours (1800-1000) -Provides 2400 kcals, 100g protein and 1219 ml H2O -Free water flushes: 120 ml every 4 hours (720 ml) -Having pain and spasms in the pain in the  diaphragm and intercostal spaces -WBC Trend: Recent Labs  Lab 04/25/23 0507 05/04/23 1210 05/22/23 0821 05/22/23 1756 05/23/23 0550  WBC 10.3 10.4 11.5* 10.3 12.6*    Chronic Diastolic CHF -ECHO in October showed LVEF of 55% and G1DD with Normal RVSF -Currently not decompensated but will need to continue monitor given the patient has been resumed on IV fluids -Strict I's and O's and Daily weights;  Intake/Output Summary (Last 24 hours) at 05/23/2023 1931 Last data filed at 05/23/2023 1812 Gross per 24 hour  Intake 539 ml  Output 550 ml  Net -11 ml  -Continue to Monitor for S/Sx of Volume Overload   Diabetes Mellitus Type 2 -HbA1c was 7.0 on 12/15/22 and repeat this visit was 6.4 -C/w Very Sensitive Novolog SSI q6h -CBG Trend: Recent Labs  Lab 04/25/23  0827 04/25/23 1233 04/25/23 1634 05/01/23 1243 05/23/23 0840 05/23/23 1211 05/23/23 1703  GLUCAP 208* 115* 178* 123* 153* 193* 105*   Hx of PE -C/w Anticoagulation with Apixaban via G Tube  Hypoalbuminemia -Patient's Albumin Trend: Recent Labs  Lab  05/04/23 1210 05/22/23 0821 05/22/23 1756 05/23/23 0550  ALBUMIN 3.6 3.4* 3.1* 2.8*  -Continue to Monitor and Trend and repeat CMP in the AM  Overweight -Complicates overall prognosis and care -Estimated body mass index is 27.25 kg/m as calculated from the following:   Height as of this encounter: 5\' 8"  (1.727 m).   Weight as of this encounter: 81.3 kg.  -Weight Loss and Dietary Counseling given

## 2023-05-23 NOTE — Plan of Care (Signed)
Pt did not complain of pain overnight. He states he mostly felt discomfort while pushing the osmolite through his PEG tube. Pt concerned with having his radiation treatment done today.  Problem: Education: Goal: Knowledge of General Education information will improve Description: Including pain rating scale, medication(s)/side effects and non-pharmacologic comfort measures Outcome: Progressing   Problem: Health Behavior/Discharge Planning: Goal: Ability to manage health-related needs will improve Outcome: Progressing   Problem: Clinical Measurements: Goal: Ability to maintain clinical measurements within normal limits will improve Outcome: Progressing Goal: Will remain free from infection Outcome: Progressing Goal: Diagnostic test results will improve Outcome: Progressing Goal: Respiratory complications will improve Outcome: Progressing Goal: Cardiovascular complication will be avoided Outcome: Progressing   Problem: Activity: Goal: Risk for activity intolerance will decrease Outcome: Progressing   Problem: Nutrition: Goal: Adequate nutrition will be maintained Outcome: Progressing   Problem: Coping: Goal: Level of anxiety will decrease Outcome: Progressing   Problem: Elimination: Goal: Will not experience complications related to bowel motility Outcome: Progressing Goal: Will not experience complications related to urinary retention Outcome: Progressing   Problem: Pain Management: Goal: General experience of comfort will improve Outcome: Progressing   Problem: Safety: Goal: Ability to remain free from injury will improve Outcome: Progressing   Problem: Skin Integrity: Goal: Risk for impaired skin integrity will decrease Outcome: Progressing

## 2023-05-23 NOTE — Telephone Encounter (Signed)
-----   Message from Nurse Guilford Shi sent at 05/22/2023  1:25 PM EST ----- Regarding: FT chemo Samuel [name] Pt completed taxol/carbo without incident. Dr. Mosetta Putt.

## 2023-05-23 NOTE — Progress Notes (Deleted)
  Electrophysiology Office Note:   Date:  05/23/2023  ID:  Samuel [name], DOB 1945-08-27, MRN [id number]  Primary Cardiologist: [name], MD Electrophysiologist: None  {Click to update primary MD,subspecialty MD or APP then REFRESH:1}    History of Present Illness:   Samuel [name] is a 77 y.o. male with h/o h/o bradycardia, AF, h/o PE and DVT, progressive dysphagia, and possible sternoclavicular lesion seen today for routine electrophysiology followup.   Pt presented to the ED on 10/11 with a chief complaint of dysphagia. He was found to have primarily intermittent 2:1 AV block while admitted. With at least one episode felt to be true Mobitz II AV block.   He has reverted to AF now, and is no longer having bradycardia.  Found with acute b/l DVTs and PE >> heparin incidentally noted to have intermittent 2:1 AV block Long segment of esophageal stenosis/stricture   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, biopsies taken 10/16 CT abdomen pelvis pending for possible percutaneous gastric tube placement, TPN initiated in the interim 10/17 G-tube placement per IR 10/18 pacemaker placement planning ongoing -tentative date 04/24/23 10/19-20 -patient continues to refuse physical therapy or ambulation screen with staff.  Placement will be difficult if he is not cooperative   No sustained bradycardia over weekend 10/19 - 10/20 and not felt to meed indication for pacing. He has a newly diagnosed gastric malignancy. ? if there is some compression/interaction with his vagus nerve, causing his intermittent AVN block. There is some concern that without a pacemaker he will be unable to receive treatment in the outpatient setting.     Review of systems complete and found to be negative unless listed in HPI.   EP Information / Studies Reviewed:    EKG is not ordered today. EKG  from 05/22/2023 reviewed which showed sinus tachycardia at 112 bpm       Echo 04/15/2023 LVEF 55%, grade 1 DD, normal RV, trivial MR, mild/mod AI    Physical Exam:   VS:  There were no vitals taken for this visit.   Wt Readings from Last 3 Encounters:  05/23/23 179 lb 3.7 oz (81.3 kg)  05/22/23 172 lb 14.4 oz (78.4 kg)  05/19/23 176 lb (79.8 kg)     GEN: Well nourished, well developed in no acute distress NECK: No JVD; No carotid bruits CARDIAC: {EPRHYTHM:28826}, no murmurs, rubs, gallops RESPIRATORY:  Clear to auscultation without rales, wheezing or rhonchi  ABDOMEN: Soft, non-tender, non-distended EXTREMITIES:  No edema; No deformity   ASSESSMENT AND PLAN:    Intermittent AV block 2:1 conduction He has not had any further bradycardia over the weekend With uninterrupted OAC and PEG tube, his risk of bleeding and or infection are significant.  Will discuss case further with MD. Leave NPO for now.    Dysphagia - Likely malignant PEG tube Esophageal stenosis. ? R sternoclavicular swelling ? Sclerotic lesion T2 noted on Ct May require chemo/radiation/surgery Pending evaluation for potential METs. A large concern is his ability to tolerate treatment without pacing. If this question arises, can revisit the need for pacing.    Acute PEs / DVTs Continue Eliquis.  Unable to interrupt, so greatly increased risk of hematoma -> infection   {Click here to Review PMH, Prob List, Meds, Allergies, SHx, FHx  :1}   Follow up with {UVOZD:66440} {EPFOLLOW HK:74259}  Signed, Graciella Freer, PA-C

## 2023-05-23 NOTE — Evaluation (Signed)
Occupational Therapy Evaluation Patient Details Name: Samuel [name] MRN: [id number] DOB: [birth year] Today's Date: 05/23/2023   History of Present Illness Samuel [name] is a 77 y.o. male admitted 05/22/23 with generalized weakness. Xray imaging and CT imaging PMH includes for esophageal cancer status post G-tube placement, undergoing chemo and radiation, chronic diastolic heart failure, type 2 diabetes mellitus, PE diagnosed in October 2024, on Eliquis.   Clinical Impression   Pt is typically mod I in home environment with DME. He is the caregiver for 2 elderly twins (they are active with [facility]) and while he does not physically help them he assists with managing their IADL and safety in the home. He states that the hardest part for him is self-feeding. The radiation has impacted his intercostal and other muscles so that he experiences pain that starts and stops when he has to push the "syringe" of food into his tube. Today he was able to use BUE for functional tasks of grooming, bathing, peri care, LB clothing management. Pt is overall min A/CGA for ADL but presents with decreased activity tolerance, generalized weakness and deconditioning. OT will follow acutely and post-acute Pt will require <3 hour daily therapy to maximize safety and independence in ADL and functional transfers.       If plan is discharge home, recommend the following: A little help with walking and/or transfers;A little help with bathing/dressing/bathroom;Assistance with feeding;Assist for transportation;Help with stairs or ramp for entrance    Functional Status Assessment  Patient has had a recent decline in their functional status and demonstrates the ability to make significant improvements in function in a reasonable and predictable amount of time.  Equipment Recommendations  None recommended by OT (Pt has appropriate DME)    Recommendations for Other Services PT consult     Precautions / Restrictions  Precautions Precautions: Fall      Mobility Bed Mobility Overal bed mobility: Needs Assistance Bed Mobility: Supine to Sit     Supine to sit: Contact guard     General bed mobility comments: use of bed rails to assist with trunk elevation    Transfers Overall transfer level: Needs assistance Equipment used: Rolling walker (2 wheels), None Transfers: Sit to/from Stand, Bed to chair/wheelchair/BSC Sit to Stand: Contact guard assist     Step pivot transfers: Contact guard assist     General transfer comment: safety      Balance Overall balance assessment: Needs assistance Sitting-balance support: Feet supported Sitting balance-Leahy Scale: Good Sitting balance - Comments: unchallenged   Standing balance support: Single extremity supported, During functional activity Standing balance-Leahy Scale: Fair Standing balance comment: extended balance in standing wit UE support                           ADL either performed or assessed with clinical judgement   ADL Overall ADL's : Needs assistance/impaired Eating/Feeding: NPO;Modified independent Eating/Feeding Details (indicate cue type and reason): manages his own oral irrigation Grooming: Wash/dry hands;Contact guard assist;Standing Grooming Details (indicate cue type and reason): sink level Upper Body Bathing: Minimal assistance Upper Body Bathing Details (indicate cue type and reason): for back Lower Body Bathing: Minimal assistance;Sitting/lateral leans Lower Body Bathing Details (indicate cue type and reason): uses tub bench at baseline Upper Body Dressing : Set up Upper Body Dressing Details (indicate cue type and reason): for extra gown like robe Lower Body Dressing: Minimal assistance Lower Body Dressing Details (indicate cue type and reason): donning socks Toilet Transfer: Contact  guard assist;Ambulation;Rolling walker (2 wheels)   Toileting- Clothing Manipulation and Hygiene: Contact guard  assist;Sit to/from stand Toileting - Clothing Manipulation Details (indicate cue type and reason): front and rear peri care     Functional mobility during ADLs: Contact guard assist;Rolling walker (2 wheels) (none in bathroom due to space) General ADL Comments: decreased activity tolerance     Vision   Vision Assessment?: No apparent visual deficits     Perception         Praxis         Pertinent Vitals/Pain Pain Assessment Pain Assessment: No/denies pain     Extremity/Trunk Assessment Upper Extremity Assessment Upper Extremity Assessment: Generalized weakness (complains of pain when he pushes feeding tube using BUE he states that the pain starts and stops immediately with pushing functionally BUE WFL)   Lower Extremity Assessment Lower Extremity Assessment: Defer to PT evaluation       Communication Communication Communication: No apparent difficulties (very tangential) Cueing Techniques: Verbal cues   Cognition Arousal: Alert Behavior During Therapy: WFL for tasks assessed/performed Overall Cognitive Status: No family/caregiver present to determine baseline cognitive functioning                                 General Comments: Pt very tangential. consistent with his background and set up. but some of his claims seem extraordinary - almost too good to be true. He is a lovely man kind and easy to work, and could be telling the truth - but no family or way to confirm     General Comments  goes by "Samuel [name]"    Exercises     Shoulder Instructions      Home Living Family/patient expects to be discharged to:: Private residence Living Arrangements: Spouse/significant other;Non-relatives/Friends Available Help at Discharge: Family;Friend(s) Type of Home: House Home Access: Stairs to enter;Ramped entrance Entrance Stairs-Number of Steps: 5 Entrance Stairs-Rails: Right;Left Home Layout: Two level;Able to live on main level with bedroom/bathroom      Bathroom Shower/Tub: Chief Strategy Officer: Standard Bathroom Accessibility: Yes How Accessible:  (difficult with both) Home Equipment: Tub bench;Grab bars - tub/shower;Hand held shower head;Cane - single point;Rolling Walker (2 wheels);BSC/3in1;Hospital bed   Additional Comments: 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 He helps with their daily ADL's and chores, though they have other help from [facility] and go to programs most days. Patient's wife here from [facility] as well though they sleep separate. patient lives on the main level      Prior Functioning/Environment Prior Level of Function : Independent/Modified Independent             Mobility Comments: walks around the house without DME, and mainly uses the RW for outside the home ADLs Comments: Indep in ADL and IADL uses tub bench and DME as energy conservation marginal meal prep but his wife assists with cleaning and cooking now        OT Problem List: Decreased strength;Decreased activity tolerance;Impaired balance (sitting and/or standing);Decreased safety awareness;Pain      OT Treatment/Interventions: Self-care/ADL training;Energy conservation;DME and/or AE instruction;Cognitive remediation/compensation;Patient/family education;Balance training    OT Goals(Current goals can be found in the care plan section) Acute Rehab OT Goals Patient Stated Goal: get better to be able to return home and resume caregiving activities OT Goal Formulation: With patient Time For Goal Achievement: 06/06/23  Potential to Achieve Goals: Good ADL Goals Pt Will Perform Grooming: with modified independence;standing Pt Will Perform Upper Body Dressing: with modified independence;sitting Pt Will Perform Lower Body Dressing: with modified independence;sit to/from stand Pt Will Transfer to Toilet: with modified independence;ambulating Pt Will Perform  Toileting - Clothing Manipulation and hygiene: with modified independence;sit to/from stand Additional ADL Goal #1: Pt will verbalize 3 energy conservation strategies to implement during ADL routine with no cues  OT Frequency: Min 1X/week    Co-evaluation              AM-PAC OT "6 Clicks" Daily Activity     Outcome Measure Help from another person eating meals?: A Lot Help from another person taking care of personal grooming?: A Little Help from another person toileting, which includes using toliet, bedpan, or urinal?: A Little Help from another person bathing (including washing, rinsing, drying)?: A Little Help from another person to put on and taking off regular upper body clothing?: A Little Help from another person to put on and taking off regular lower body clothing?: A Little 6 Click Score: 17   End of Session Equipment Utilized During Treatment: Gait belt;Rolling walker (2 wheels) Nurse Communication: Mobility status;Other (comment) (BM and urine documented)  Activity Tolerance: Patient tolerated treatment well Patient left: in chair;with call bell/phone within reach;with chair alarm set;Other (comment) (MD in room)  OT Visit Diagnosis: Unsteadiness on feet (R26.81);Muscle weakness (generalized) (M62.81);Other symptoms and signs involving cognitive function;Adult, failure to thrive (R62.7)                Time: 6578-4696 OT Time Calculation (min): 61 min Charges:  OT General Charges $OT Visit: 1 Visit OT Evaluation $OT Eval Moderate Complexity: 1 Mod OT Treatments $Self Care/Home Management : 8-22 mins  Nyoka Cowden OTR/L Acute Rehabilitation Services Office: [phone]  Evern Bio [facility] 05/23/2023, 10:54 AM

## 2023-05-23 NOTE — Evaluation (Signed)
Physical Therapy Evaluation Patient Details Name: Samuel [name] MRN: [id number] DOB: 12-30-1945 Today's Date: 05/23/2023  History of Present Illness  Mostafa Huebsch is a 77 y.o. male admitted 05/22/23 with generalized weakness. Xray imaging and CT imaging unremarkable. PMH includes for esophageal cancer status post G-tube placement, undergoing chemo and radiation, chronic diastolic heart failure, type 2 diabetes mellitus, PE diagnosed in October 2024, on Eliquis.  Clinical Impression  Pt admitted with above diagnosis. At baseline, pt ambulatory without AD.  He lives with his wife and 2 elderly [place] ladies that he helps to take care of.  Today, he was supervision level for transfers and ambulated 200' with RW safely.  Pt reports feeling tired and weaker than baseline.  Pt expected to progress well with therapy.  He had expressed that he would like SNF at d/c; however, from PT perspective pt at supervision level and AMPAC score of 20, no PT needs for SNF.  Recommend HHPT at d/c. Pt currently with functional limitations due to the deficits listed below (see PT Problem List). Pt will benefit from acute skilled PT to increase their independence and safety with mobility to allow discharge.           If plan is discharge home, recommend the following: A little help with walking and/or transfers;A little help with bathing/dressing/bathroom;Assistance with cooking/housework;Help with stairs or ramp for entrance   Can travel by private vehicle        Equipment Recommendations None recommended by PT  Recommendations for Other Services       Functional Status Assessment Patient has had a recent decline in their functional status and demonstrates the ability to make significant improvements in function in a reasonable and predictable amount of time.     Precautions / Restrictions Precautions Precautions: Fall      Mobility  Bed Mobility Overal bed mobility: Needs Assistance Bed Mobility:  Supine to Sit, Sit to Supine     Supine to sit: Supervision Sit to supine: Supervision        Transfers Overall transfer level: Needs assistance Equipment used: Rolling walker (2 wheels), None Transfers: Sit to/from Stand Sit to Stand: Supervision                Ambulation/Gait Ambulation/Gait assistance: Contact guard assist, Supervision Gait Distance (Feet): 200 Feet Assistive device: Rolling walker (2 wheels), None Gait Pattern/deviations: Step-through pattern Gait velocity: decreased     General Gait Details: CGA progressing to supervision.  Utilized RW in hallway and no AD in room. No LOB just reports fatigue  Stairs            Wheelchair Mobility     Tilt Bed    Modified Rankin (Stroke Patients Only)       Balance Overall balance assessment: Needs assistance Sitting-balance support: Feet supported Sitting balance-Leahy Scale: Good     Standing balance support: No upper extremity supported Standing balance-Leahy Scale: Good Standing balance comment: Did ambulate in room without AD, pt adjusting lines and bed sheets in standing, performed ADLs in standing                             Pertinent Vitals/Pain Pain Assessment Pain Assessment: No/denies pain    Home Living   Living Arrangements: Spouse/significant other;Non-relatives/Friends Available Help at Discharge: Family;Friend(s);Available 24 hours/day Type of Home: House Home Access: Stairs to enter;Ramped entrance Entrance Stairs-Rails: Right;Left Entrance Stairs-Number of Steps: 5 Alternate Level Stairs-Number of Steps: flight  Home Layout: Two level;Able to live on main level with bedroom/bathroom Home Equipment: Tub bench;Grab bars - tub/shower;Hand held shower head;Cane - single point;Rolling Walker (2 wheels);BSC/3in1;Hospital bed Additional Comments: 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 He helps with their daily ADL's and chores, though they have other help from [facility] and go to programs most days. Patient's wife here from [place] there as well.  Patient lives on the main level    Prior Function Prior Level of Function : Independent/Modified Independent             Mobility Comments: walks around the house without DME, and mainly uses the RW for outside the home ADLs Comments: Indep in ADL and IADL uses tub bench and DME as energy conservation marginal meal prep but his wife assists with cleaning and cooking now     Extremity/Trunk Assessment   Upper Extremity Assessment Upper Extremity Assessment: Defer to OT evaluation    Lower Extremity Assessment Lower Extremity Assessment: LLE deficits/detail;RLE deficits/detail RLE Deficits / Details: ROM WFL; MMT 5/5 LLE Deficits / Details: ROM WFL; MMT 5/5    Cervical / Trunk Assessment Cervical / Trunk Assessment: Normal  Communication      Cognition Arousal: Alert Behavior During Therapy: WFL for tasks assessed/performed Overall Cognitive Status: No family/caregiver present to determine baseline cognitive functioning                                 General Comments: Pt very tangential. consistent with his background and set up but no family to confirm        General Comments      Exercises     Assessment/Plan    PT Assessment Patient needs continued PT services  PT Problem List Decreased strength;Pain;Decreased range of motion;Decreased activity tolerance;Decreased knowledge of use of DME;Decreased balance;Decreased mobility       PT Treatment Interventions DME instruction;Therapeutic exercise;Gait training;Functional mobility training;Therapeutic activities;Patient/family education;Neuromuscular re-education;Stair training;Balance training;Modalities    PT Goals (Current goals can be found in the Care Plan section)  Acute Rehab PT Goals Patient Stated Goal: pt expressed wanting to  go to SNF to OT - largely due to spasms with tube feeds PT Goal Formulation: With patient Time For Goal Achievement: 06/06/23 Potential to Achieve Goals: Good    Frequency Min 1X/week     Co-evaluation               AM-PAC PT "6 Clicks" Mobility  Outcome Measure Help needed turning from your back to your side while in a flat bed without using bedrails?: None Help needed moving from lying on your back to sitting on the side of a flat bed without using bedrails?: None Help needed moving to and from a bed to a chair (including a wheelchair)?: A Little Help needed standing up from a chair using your arms (e.g., wheelchair or bedside chair)?: A Little Help needed to walk in hospital room?: A Little Help needed climbing 3-5 steps with a railing? : A Little 6 Click Score: 20    End of Session   Activity Tolerance: Patient tolerated treatment well Patient left: in bed;with call bell/phone within reach;with bed alarm set Nurse Communication: Mobility status PT Visit Diagnosis: Other abnormalities of gait and mobility (R26.89);Muscle weakness (generalized) (M62.81)    Time: 1610-9604 PT Time Calculation (min) (ACUTE ONLY): 18 min  Charges:   PT Evaluation $PT Eval Low Complexity: 1 Low   PT General Charges $$ ACUTE PT VISIT: 1 Visit         Anise Salvo, PT Acute Rehab [facility] [phone]   Rayetta Humphrey 05/23/2023, 2:55 PM

## 2023-05-23 NOTE — Progress Notes (Signed)
PROGRESS NOTE    Samuel [name]  NGE:[id number] DOB: 11-17-1945 DOA: 05/22/2023 PCP: [name], MD   Brief Narrative:  Patient is a 77 year old Caucasian male with a past medical history significant for melanoma to esophageal cancer status post G-tube placement who is currently undergoing chemotherapy and radiation, chronic diastolic CHF, diabetes mellitus type 2, pulmonary embolism diagnosed in October 2024 on anticoagulation as well as other comorbidities who presented was along with generalized weakness without any focal deficits.  He states that he recently started chemotherapy and radiation for his esophageal cancer.  Denied any chest pain or shortness of breath or fevers, chills myalgias.  States that he has a G-tube in place and has been n.p.o. otherwise.  No diarrhea.  He came to the ED and was worked up and was admitted for further evaluation of his generalized weakness and dehydration and is slowly improving.  Will continue IV fluid hydration overnight and anticipate discharge in next 24 to 48 hours if stable and PT OT evaluated today.  Assessment and Plan:  Generalized Weakness and Physical Deconditioning -In the absence of any acute focal neurological deficits or focal weakness to suggest CVA -Likely stemming from his chemotherapy and radiation as well as clinical evidence of dehydration -No infectious process noted in WBC likely in setting of dehydration -Chest x-ray showed no evidence of infiltrate and CT of the abdomen pelvis done and showed no evidence of acute intra-abdominal or intrapelvic processes -PT OT evaluated and OT recommending SNF with PT recommending home health -Continue with fall precautions and repeat labs in the a.m. -Resume gentle IV fluids but increase the rate from 50 mL/hr to 75 mL/hr x1 Day -Check TSH and obtain orthostatic vital signs in the a.m. and anticipate discharge in the next 24 to 48 hours of stable  Dehydration -Had dry oral mucous membranes as  well as laboratory findings notable for acute prerenal azotemia and UA demonstrating elevated specific gravity -IV fluid hydration now increased to 75 mL/h and plan for 1 day -Resume home Osmolite bolus feedings and tube feedings per nutritionist -Strict I's and O's and daily weights  Esophageal Cancer and recent abdominal pain -Recently started Chemotherapy and Radiation is currently going on with -CT Abd/Pelvis done and showed G-tube placed within the stomach -Currently NPO but will allow Clears for Water for Irrigation of his throat -C/w G-Tube feeding with Nutritionist recommendations: Per Nutritionist: Transition to night feed regimen tonight at 1800: -Osmolite 1.5 @ 60 ml/hr x 16 hours (1800-1000) via PEG   If tolerates tonight, can go to goal rate the next night (11/20) of: -Osmolite 1.5 @ 100 ml/hr x 16 hours (1800-1000) -Provides 2400 kcals, 100g protein and 1219 ml H2O -Free water flushes: 120 ml every 4 hours (720 ml) -Having pain and spasms in the pain in the  diaphragm and intercostal spaces -WBC Trend: Recent Labs  Lab 04/25/23 0507 05/04/23 1210 05/22/23 0821 05/22/23 1756 05/23/23 0550  WBC 10.3 10.4 11.5* 10.3 12.6*    Chronic Diastolic CHF -ECHO in October showed LVEF of 55% and G1DD with Normal RVSF -Currently not decompensated but will need to continue monitor given the patient has been resumed on IV fluids -Strict I's and O's and Daily weights;  Intake/Output Summary (Last 24 hours) at 05/23/2023 1931 Last data filed at 05/23/2023 1812 Gross per 24 hour  Intake 539 ml  Output 550 ml  Net -11 ml  -Continue to Monitor for S/Sx of Volume Overload   Diabetes Mellitus Type 2 -HbA1c was  7.0 on 12/15/22 and repeat this visit was 6.4 -C/w Very Sensitive Novolog SSI q6h -CBG Trend: Recent Labs  Lab 04/25/23 0827 04/25/23 1233 04/25/23 1634 05/01/23 1243 05/23/23 0840 05/23/23 1211 05/23/23 1703  GLUCAP 208* 115* 178* 123* 153* 193* 105*   Hx of  PE -C/w Anticoagulation with Apixaban via G Tube  Hypoalbuminemia -Patient's Albumin Trend: Recent Labs  Lab 05/04/23 1210 05/22/23 0821 05/22/23 1756 05/23/23 0550  ALBUMIN 3.6 3.4* 3.1* 2.8*  -Continue to Monitor and Trend and repeat CMP in the AM  Overweight -Complicates overall prognosis and care -Estimated body mass index is 27.25 kg/m as calculated from the following:   Height as of this encounter: 5\' 8"  (1.727 m).   Weight as of this encounter: 81.3 kg.  -Weight Loss and Dietary Counseling given   DVT prophylaxis: SCDs Start: 05/22/23 2143 apixaban (ELIQUIS) tablet 5 mg    Code Status: Full Code Family Communication: No family present at bedside  Disposition Plan:  Level of care: Telemetry Status is: Observation The patient remains OBS appropriate and will d/c before 2 midnights.   Consultants:  Dr. Mosetta Putt notified via Epic by Admitter  Procedures:  As delineated as above  Antimicrobials:  Anti-infectives (From admission, onward)    None       Subjective: Seen and examined at bedside and was doing okay but states that he is having difficulty clearing his mucus.  Continues to have some squeezing pain noted.  Continues to feel weak.  No nausea or vomiting.  Denies any other concerns or complaints this time.  Objective: Vitals:   05/22/23 2309 05/23/23 0302 05/23/23 0500 05/23/23 1413  BP: 134/78 104/65  102/61  Pulse: 66 88  83  Resp: 20 19  16   Temp: 98.5 F (36.9 C) 98.6 F (37 C)  98.4 F (36.9 C)  TempSrc: Oral Oral  Oral  SpO2: 98% 97%  95%  Weight:   81.3 kg   Height:        Intake/Output Summary (Last 24 hours) at 05/23/2023 1933 Last data filed at 05/23/2023 1812 Gross per 24 hour  Intake 539 ml  Output 550 ml  Net -11 ml   Filed Weights   05/22/23 1711 05/23/23 0500  Weight: 78.4 kg 81.3 kg   Examination: Physical Exam:  Constitutional: Chronically ill-appearing Caucasian male in no acute distress Respiratory: Diminished to  auscultation bilaterally, no wheezing, rales, rhonchi or crackles. Normal respiratory effort and patient is not tachypenic. No accessory muscle use.  Unlabored breathing Cardiovascular: RRR, no murmurs / rubs / gallops. S1 and S2 auscultated. No extremity edema.  Abdomen: Soft, Slightly-tender, Distended 2/2 body habitus.  PEG tube is in place bowel sounds positive.  GU: Deferred. Musculoskeletal: No clubbing / cyanosis of digits/nails. No joint deformity upper and lower extremities.  Skin: No rashes, lesions, ulcers on a limited skin evaluation. No induration; Warm and dry.  Neurologic: CN 2-12 grossly intact with no focal deficits. Romberg sign cerebellar reflexes not assessed.  Psychiatric: Normal judgment and insight. Alert and oriented x 3. Normal mood and appropriate affect.   Data Reviewed: I have personally reviewed following labs and imaging studies  CBC: Recent Labs  Lab 05/22/23 0821 05/22/23 1756 05/23/23 0550  WBC 11.5* 10.3 12.6*  NEUTROABS 7.9* 9.2* 9.8*  HGB 15.2 15.3 15.0  HCT 46.6 45.5 47.0  MCV 90.7 90.6 93.1  PLT 268 259 257   Basic Metabolic Panel: Recent Labs  Lab 05/22/23 0821 05/22/23 1756 05/22/23 2230 05/23/23 0550  NA 139 137  --  138  K 4.0 4.3  --  4.2  CL 106 107  --  108  CO2 26 22  --  22  GLUCOSE 168* 183*  --  97  BUN 25* 22  --  21  CREATININE 0.68 0.64  --  0.53*  CALCIUM 8.9 8.4*  --  8.3*  MG  --   --  2.4 2.5*   GFR: Estimated Creatinine Clearance: 74.8 mL/min (A) (by C-G formula based on SCr of 0.53 mg/dL (L)). Liver Function Tests: Recent Labs  Lab 05/22/23 0821 05/22/23 1756 05/23/23 0550  AST 17 24 21   ALT 14 19 18   ALKPHOS 64 63 57  BILITOT 0.8 1.0 0.6  PROT 6.3* 6.3* 5.9*  ALBUMIN 3.4* 3.1* 2.8*   Recent Labs  Lab 05/22/23 1756  LIPASE 28   No results for input(s): "AMMONIA" in the last 168 hours. Coagulation Profile: No results for input(s): "INR", "PROTIME" in the last 168 hours. Cardiac Enzymes: No  results for input(s): "CKTOTAL", "CKMB", "CKMBINDEX", "TROPONINI" in the last 168 hours. BNP (last 3 results) No results for input(s): "PROBNP" in the last 8760 hours. HbA1C: Recent Labs    05/23/23 0550  HGBA1C 6.4*   CBG: Recent Labs  Lab 05/23/23 0840 05/23/23 1211 05/23/23 1703  GLUCAP 153* 193* 105*   Lipid Profile: No results for input(s): "CHOL", "HDL", "LDLCALC", "TRIG", "CHOLHDL", "LDLDIRECT" in the last 72 hours. Thyroid Function Tests: Recent Labs    05/23/23 0621  TSH 0.532   Anemia Panel: Recent Labs    05/23/23 0621  VITAMINB12 296   Sepsis Labs: No results for input(s): "PROCALCITON", "LATICACIDVEN" in the last 168 hours.  No results found for this or any previous visit (from the past 240 hour(s)).   Radiology Studies: CT ABDOMEN PELVIS W CONTRAST  Result Date: 05/22/2023 CLINICAL DATA:  Abdominal pain. Pain around G-tube. History of esophageal cancer. EXAM: CT ABDOMEN AND PELVIS WITH CONTRAST TECHNIQUE: Multidetector CT imaging of the abdomen and pelvis was performed using the standard protocol following bolus administration of intravenous contrast. RADIATION DOSE REDUCTION: This exam was performed according to the departmental dose-optimization program which includes automated exposure control, adjustment of the mA and/or kV according to patient size and/or use of iterative reconstruction technique. CONTRAST:  OMNIPAQUE IOHEXOL 300 MG/ML  SOLN COMPARISON:  04/19/2023 FINDINGS: Lower chest: Elevation of the left hemidiaphragm. Left base atelectasis. Right lung base clear. No effusions. Heart is normal size. Hepatobiliary: Small gallstone noted, unchanged. No focal hepatic abnormality or biliary ductal dilatation. Pancreas: No focal abnormality or ductal dilatation. Spleen: No focal abnormality.  Normal size. Adrenals/Urinary Tract: Adrenal glands normal. Stable large cyst in the left kidney measuring up to 8 cm. No follow-up imaging recommended. No stones  or hydronephrosis. Urinary bladder unremarkable. Stomach/Bowel: Left colonic diverticulosis. No active diverticulitis. Stomach and small bowel decompressed, unremarkable. Normal appendix. Vascular/Lymphatic: Aortic atherosclerosis. No evidence of aneurysm or adenopathy. Reproductive: Prostate enlargement. Other: No free fluid or free air. Musculoskeletal: No acute or focal osseous abnormality. IMPRESSION: Gastrostomy tube within the stomach. No visible complicating feature. Left colonic diverticulosis.  No active diverticulitis. Cholelithiasis, unchanged. Elevation of the left hemidiaphragm.  Left base atelectasis. Prostate enlargement. Aortic atherosclerosis. Electronically Signed   By: Charlett Nose M.D.   On: 05/22/2023 21:15   DG Abdomen 1 View  Result Date: 05/22/2023 CLINICAL DATA:  Evaluate feeding tube EXAM: ABDOMEN - 1 VIEW COMPARISON:  CT 04/19/2023 FINDINGS: Feeding tube projects over the  upper abdomen near the midline, exact location difficult to determine on this single view without contrast. No bowel obstruction, free air or organomegaly. IMPRESSION: Feeding tube projects over the upper abdomen near the midline. No acute findings. Electronically Signed   By: Charlett Nose M.D.   On: 05/22/2023 21:10   DG Chest 2 View  Result Date: 05/22/2023 CLINICAL DATA:  Pain around G-tube. Concern for aspiration. Esophageal cancer EXAM: CHEST - 2 VIEW COMPARISON:  None available FINDINGS: Elevation of the left hemidiaphragm. No confluent airspace opacities or effusions. Heart mediastinal contours within normal limits. No acute bony abnormality. IMPRESSION: No active cardiopulmonary disease. Electronically Signed   By: Charlett Nose M.D.   On: 05/22/2023 21:09     Scheduled Meds:  apixaban  5 mg Per Tube BID   feeding supplement (OSMOLITE 1.5 CAL)  1,000 mL Per Tube Q24H   free water  120 mL Per Tube 6 X Daily   insulin aspart  0-6 Units Subcutaneous Q6H   mouth rinse  15 mL Mouth Rinse 4 times per  day   Continuous Infusions:  sodium chloride      LOS: 0 days   Marguerita Merles, DO [facility] Available via Epic secure chat 7am-7pm After these hours, please refer to coverage provider listed on [email or web address] 05/23/2023, 7:33 PM

## 2023-05-24 ENCOUNTER — Ambulatory Visit: Payer: 59 | Admitting: Student

## 2023-05-24 ENCOUNTER — Other Ambulatory Visit: Payer: Self-pay

## 2023-05-24 ENCOUNTER — Ambulatory Visit
Admission: RE | Admit: 2023-05-24 | Discharge: 2023-05-24 | Disposition: A | Discharge status: Home / Self Care | Payer: 59 | Source: Ambulatory Visit | Attending: Radiation Oncology | Admitting: Radiation Oncology

## 2023-05-24 DIAGNOSIS — R531 Weakness: Secondary | ICD-10-CM | POA: Diagnosis not present

## 2023-05-24 DIAGNOSIS — Z510 Encounter for antineoplastic radiation therapy: Secondary | ICD-10-CM | POA: Diagnosis not present

## 2023-05-24 DIAGNOSIS — R1319 Other dysphagia: Secondary | ICD-10-CM

## 2023-05-24 DIAGNOSIS — I2699 Other pulmonary embolism without acute cor pulmonale: Secondary | ICD-10-CM

## 2023-05-24 DIAGNOSIS — I10 Essential (primary) hypertension: Secondary | ICD-10-CM

## 2023-05-24 DIAGNOSIS — C155 Malignant neoplasm of lower third of esophagus: Secondary | ICD-10-CM | POA: Diagnosis not present

## 2023-05-24 LAB — COMPREHENSIVE METABOLIC PANEL
ALT: 17 U/L (ref 0–44)
AST: 23 U/L (ref 15–41)
Albumin: 2.9 g/dL — ABNORMAL LOW (ref 3.5–5.0)
Alkaline Phosphatase: 62 U/L (ref 38–126)
Anion gap: 6 (ref 5–15)
BUN: 20 mg/dL (ref 8–23)
CO2: 24 mmol/L (ref 22–32)
Calcium: 8.4 mg/dL — ABNORMAL LOW (ref 8.9–10.3)
Chloride: 108 mmol/L (ref 98–111)
Creatinine, Ser: 0.6 mg/dL — ABNORMAL LOW (ref 0.61–1.24)
GFR, Estimated: >60 mL/min (ref >60)
Glucose, Bld: 115 mg/dL — ABNORMAL HIGH (ref 70–99)
Potassium: 4 mmol/L (ref 3.5–5.1)
Sodium: 138 mmol/L (ref 135–145)
Total Bilirubin: 0.9 mg/dL (ref <1.2)
Total Protein: 5.9 g/dL — ABNORMAL LOW (ref 6.5–8.1)

## 2023-05-24 LAB — RAD ONC ARIA SESSION SUMMARY
Course Elapsed Days: 2
Plan Fractions Treated to Date: 3
Plan Prescribed Dose Per Fraction: 1.8 Gy
Plan Total Fractions Prescribed: 25
Plan Total Prescribed Dose: 45 Gy
Reference Point Dosage Given to Date: 5.4 Gy
Reference Point Session Dosage Given: 1.8 Gy
Session Number: 3

## 2023-05-24 LAB — CBC WITH DIFFERENTIAL/PLATELET
Abs Immature Granulocytes: 0.07 10*3/uL (ref 0.00–0.07)
Basophils Absolute: 0 10*3/uL (ref 0.0–0.1)
Basophils Relative: 1 %
Eosinophils Absolute: 0.1 10*3/uL (ref 0.0–0.5)
Eosinophils Relative: 2 %
HCT: 47.3 % (ref 39.0–52.0)
Hemoglobin: 15.4 g/dL (ref 13.0–17.0)
Immature Granulocytes: 1 %
Lymphocytes Relative: 23 %
Lymphs Abs: 1.8 10*3/uL (ref 0.7–4.0)
MCH: 30.4 pg (ref 26.0–34.0)
MCHC: 32.6 g/dL (ref 30.0–36.0)
MCV: 93.3 fL (ref 80.0–100.0)
Monocytes Absolute: 0.5 10*3/uL (ref 0.1–1.0)
Monocytes Relative: 7 %
Neutro Abs: 5.4 10*3/uL (ref 1.7–7.7)
Neutrophils Relative %: 66 %
Platelets: 212 10*3/uL (ref 150–400)
RBC: 5.07 MIL/uL (ref 4.22–5.81)
RDW: 15.6 % — ABNORMAL HIGH (ref 11.5–15.5)
WBC: 8 10*3/uL (ref 4.0–10.5)
nRBC: 0 % (ref 0.0–0.2)

## 2023-05-24 LAB — TSH: TSH: 0.952 u[IU]/mL (ref 0.350–4.500)

## 2023-05-24 LAB — GLUCOSE, CAPILLARY
Glucose-Capillary: 102 mg/dL — ABNORMAL HIGH (ref 70–99)
Glucose-Capillary: 138 mg/dL — ABNORMAL HIGH (ref 70–99)
Glucose-Capillary: 146 mg/dL — ABNORMAL HIGH (ref 70–99)

## 2023-05-24 LAB — PHOSPHORUS: Phosphorus: 3.4 mg/dL (ref 2.5–4.6)

## 2023-05-24 LAB — MAGNESIUM: Magnesium: 2.5 mg/dL — ABNORMAL HIGH (ref 1.7–2.4)

## 2023-05-24 MED ORDER — OSMOLITE 1.5 CAL PO LIQD: 355.0000 mL | Freq: Four times a day (QID) | ORAL | Status: DC

## 2023-05-24 NOTE — Progress Notes (Signed)
Mobility Specialist - Progress Note   05/24/23 1407  Mobility  Activity Ambulated with assistance to bathroom  Level of Assistance Standby assist, set-up cues, supervision of patient - no hands on  Assistive Device Front wheel walker  Distance Ambulated (ft) 20 ft  Range of Motion/Exercises Active  Activity Response Tolerated well  Mobility Referral Yes  $Mobility charge 1 Mobility  Mobility Specialist Start Time (ACUTE ONLY) 1350  Mobility Specialist Stop Time (ACUTE ONLY) 1405  Mobility Specialist Time Calculation (min) (ACUTE ONLY) 15 min   Received in bed and requesting to use restroom. No issues and returned to bed with all needs met.  Marilynne Halsted Mobility Specialist

## 2023-05-24 NOTE — TOC Initial Note (Signed)
Transition of Care [facility]) - Initial/Assessment Note    Patient Details  Name: Samuel [name] MRN: [id number] Date of Birth: 10-14-1945  Transition of Care [facility]) CM/SW Contact:    Otelia Santee, LCSW Phone Number: 05/24/2023, 11:04 AM  Clinical Narrative:                 Pt from home alone. Pt recommended for home health services at discharge. Pt shares he has not had home health services before but, his spouse has. He does not have a preference for a HHA. HHPT/OT has been arranged with [facility]. HH orders will need to be placed prior to discharge.  Pt screened for SDOH concern with transportation. Pt shares he has used Psychologist, educational and other transportation services in the past to get to appointments. Pt is agreeable to have additional resources for transportation placed on his AVS.   Expected Discharge Plan: Home w Home Health Services Barriers to Discharge: No Barriers Identified   Patient Goals and CMS Choice Patient states their goals for this hospitalization and ongoing recovery are:: To go home CMS Medicare.gov Compare Post Acute Care list provided to:: Patient Choice offered to / list presented to : Patient Crown ownership interest in [facility].provided to::  (NA)    Expected Discharge Plan and Services In-house Referral: Clinical Social Work Discharge Planning Services: NA Post Acute Care Choice: Home Health Living arrangements for the past 2 months: Single Family Home                 DME Arranged: N/A DME Agency: NA       HH Arranged: PT, OT HH AgencyHotel manager Home Health Care Date [facility] Agency Contacted: 05/24/23 Time HH Agency Contacted: 1104 Representative spoke with at [facility] Agency: Cindie  Prior Living Arrangements/Services Living arrangements for the past 2 months: Single Family Home Lives with:: Self Patient language and need for interpreter reviewed:: Yes Do you feel safe going back to the place where you live?: Yes      Need for Family  Participation in Patient Care: No (Comment) Care giver support system in place?: No (comment) Current home services: DME Criminal Activity/Legal Involvement Pertinent to Current Situation/Hospitalization: No - Comment as needed  Activities of Daily Living   ADL Screening (condition at time of admission) Independently performs ADLs?: Yes (appropriate for developmental age) Is the patient deaf or have difficulty hearing?: Yes Does the patient have difficulty seeing, even when wearing glasses/contacts?: Yes Does the patient have difficulty concentrating, remembering, or making decisions?: No  Permission Sought/Granted Permission sought to share information with : Facility Industrial/product designer granted to share information with : Yes, Verbal Permission Granted     Permission granted to share info w AGENCY: HHA        Emotional Assessment Appearance:: Appears stated age Attitude/Demeanor/Rapport: Engaged Affect (typically observed): Accepting Orientation: : Oriented to Self, Oriented to Place, Oriented to  Time, Oriented to Situation Alcohol / Substance Use: Not Applicable Psych Involvement: No (comment)  Admission diagnosis:  Generalized abdominal pain [R10.84] Generalized weakness [R53.1] Malignant neoplasm of esophagus, unspecified location [facility]) [C15.9] Patient Active Problem List   Diagnosis Date Noted   Acute prerenal azotemia 05/23/2023   Esophageal cancer (HCC) 05/23/2023   Chronic diastolic CHF (congestive heart failure) (HCC) 05/23/2023   History of pulmonary embolism 05/23/2023   Generalized weakness 05/22/2023   Protein-calorie malnutrition, severe 04/22/2023   Malignant neoplasm of esophagus (HCC) 04/20/2023   AV block, Mobitz 2 04/19/2023  Bradycardia 04/18/2023   Dysphagia 04/14/2023   Dehydration 04/14/2023   LFT elevation 04/14/2023   Acute pulmonary embolism (HCC) 04/14/2023   Swelling of clavicular region 04/14/2023   Pulmonary embolism (HCC)  04/14/2023   Troponin I above reference range 04/14/2023   DM2 (diabetes mellitus, type 2) (HCC) 12/19/2022   Primary hypertension 02/01/2022   Prediabetes 06/15/2015   Preventative health care 06/15/2015   Psoriasis 06/15/2015   PCP:  [name], MD Pharmacy:   [facility] [phone] Ginette Otto, [place] - [address] AT [facility] [address] [place] [address] Phone: [phone] Fax: [phone]     Social Determinants of Health (SDOH) Social History: SDOH Screenings   Food Insecurity: No Food Insecurity (05/23/2023)  Housing: Low Risk  (05/23/2023)  Transportation Needs: Unmet Transportation Needs (05/23/2023)  Utilities: Not At Risk (05/23/2023)  Alcohol Screen: Low Risk  (08/19/2022)  Depression (PHQ2-9): Low Risk  (05/09/2023)  Financial Resource Strain: Low Risk  (08/19/2022)  Physical Activity: Sufficiently Active (08/19/2022)  Social Connections: Moderately Integrated (08/19/2022)  Stress: No Stress Concern Present (08/19/2022)  Tobacco Use: Medium Risk (05/23/2023)  Health Literacy: Adequate Health Literacy (05/05/2023)   SDOH Interventions:     Readmission Risk Interventions    04/25/2023   11:05 AM  Readmission Risk Prevention Plan  Post Dischage Appt Complete  Medication Screening Complete  Transportation Screening Complete

## 2023-05-24 NOTE — Progress Notes (Signed)
AVS reviewed w/ pt who verbalized an understanding. PIV removed as documented. Pt's ride will be here at 1500 per pt. Primary RN updated

## 2023-05-24 NOTE — Plan of Care (Signed)
  Problem: Clinical Measurements: Goal: Diagnostic test results will improve Outcome: Progressing Goal: Respiratory complications will improve Outcome: Progressing Goal: Cardiovascular complication will be avoided Outcome: Progressing   

## 2023-05-24 NOTE — Progress Notes (Signed)
Per dietician, [employer] w// try and meet with pt today or tomorrow to see if there is an alternative syringe for tube feeds.

## 2023-05-24 NOTE — Progress Notes (Signed)
Nutrition Follow-up  INTERVENTION:   Pt now would like to resume home regimen with bolus tube feedings.  -Goal: 1.5 cartons (355 ml) Osmolite 1.5 QID via PEG -Flush with 60 ml free water before and after each feeding -Needs additional free water: 120 ml x 6 daily (720 ml) -60 ml Prosource TF 20 BID -Provides 2290 kcals, 129g protein and 1806 ml H2O   NUTRITION DIAGNOSIS:   Increased nutrient needs related to cancer and cancer related treatments as evidenced by estimated needs.  Ongoing.  GOAL:   Patient will meet greater than or equal to 90% of their needs  Progressing.  MONITOR:   Labs, Weight trends, TF tolerance, I & O's  ASSESSMENT:   77 y.o. male with medical history significant for esophageal cancer status post G-tube placement, undergoing chemo and radiation, chronic diastolic heart failure, type 2 diabetes mellitus, pulmonary embolism diagnosed in October 2024, on Eliquis, who is admitted to [facility] on 05/22/2023 with generalized weakness  Spoke with patient at bedside today. States he tolerated the tube feeding overnight but does not want to go home with a pump now. Didn't like having to be elevated when sleeping. He would like to resume his bolus regimen once discharged. He has all the supplies, did have a question about getting a syringe that is easier to use (?).  Pt adamant today he has support at home if he is unable to administer his feeds or meds himself. States rehab is not an option for him but again states he is able to get help if needed.  Updated Jeri Modena with [employer] the change in discharge plans. She will try to see patient today to see if she can help him with his feeding regimen.   Admission weight: 172 lbs Current weight: 169 lbs  Medications reviewed.   Labs reviewed: CBGs: 102-193 Elevated Mg  Diet Order:   Diet Order             Diet clear liquid Fluid consistency: Thin  Diet effective now                    EDUCATION NEEDS:   Education needs have been addressed  Skin:  Skin Assessment: Reviewed RN Assessment  Last BM:  11/20  Height:   Ht Readings from Last 1 Encounters:  05/22/23 5\' 8"  (1.727 m)    Weight:   Wt Readings from Last 1 Encounters:  05/24/23 76.7 kg    BMI:  Body mass index is 25.73 kg/m.  Estimated Nutritional Needs:   Kcal:  2200-2400  Protein:  110-125g  Fluid:  2.2L/day  Tilda Franco, MS, RD, LDN Inpatient Clinical Dietitian Contact information available via Amion

## 2023-05-24 NOTE — Discharge Summary (Signed)
[facility]  Physician Discharge Summary   Patient ID: Samuel [name] MRN: [id number] DOB/AGE: 1946/05/25 77 y.o.  Admit date: 05/22/2023 Discharge date: 05/24/2023    PCP: [name], MD  DISCHARGE DIAGNOSES:    Generalized weakness   DM2 (diabetes mellitus, type 2) (HCC)   Acute prerenal azotemia   Esophageal cancer (HCC)   Chronic diastolic CHF (congestive heart failure) (HCC)   History of pulmonary embolism   RECOMMENDATIONS FOR OUTPATIENT FOLLOW UP: Outpatient follow-up with the oncology   Home Health: PT OT Equipment/Devices: None  CODE STATUS: Full code   DISCHARGE CONDITION: fair  Diet recommendation: As before  INITIAL HISTORY: 77 year old Caucasian male with a past medical history significant for melanoma to esophageal cancer status post G-tube placement who is currently undergoing chemotherapy and radiation, chronic diastolic CHF, diabetes mellitus type 2, pulmonary embolism diagnosed in October 2024 on anticoagulation as well as other comorbidities who presented was along with generalized weakness without any focal deficits. He states that he recently started chemotherapy and radiation for his esophageal cancer. Denied any chest pain or shortness of breath or fevers, chills myalgias. States that he has a G-tube in place and has been n.p.o. otherwise. No diarrhea. He came to the ED and was worked up and was admitted for further evaluation of his generalized weakness and dehydration and is slowly improving. Will continue IV fluid hydration overnight and anticipate discharge in next 24 to 48 hours if stable and PT OT evaluated today.   HOSPITAL COURSE:   Generalized Weakness and Physical Deconditioning Thought to be secondary to chemotherapy and radiation treatments.  Patient was given IV fluids.  Improvement noted.  CT scan of the abdomen pelvis did not show any acute findings.  Chest x-ray did not show any acute findings.   Dehydration Improved  with IV fluids.   Esophageal Cancer and recent abdominal pain -Recently started Chemotherapy and Radiation is currently going on with -CT Abd/Pelvis done and showed G-tube placed within the stomach   Chronic Diastolic CHF -ECHO in October showed LVEF of 55% and G1DD with Normal RVSF   Diabetes Mellitus Type 2 -HbA1c was 7.0 on 12/15/22 and repeat this visit was 6.4   Hx of PE -C/w Anticoagulation with Apixaban via G Tube   Hypoalbuminemia   Overweight -Estimated body mass index is 27.25 kg/m as calculated from the following:   Height as of this encounter: 5\' 8"  (1.727 m).   Weight as of this encounter: 81.3 kg.     Patient initially wanted assistance with the tube feedings at home.  He was feeling overwhelmed.  Plan was to involve TOC as well as nutritionist.  However then subsequently I was notified by nurse that he was asking to be discharged immediately.  Since he was otherwise stable he was discharged home.  He was asked to follow-up with his medical oncologist for further issues.  PERTINENT LABS:  The results of significant diagnostics from this hospitalization (including imaging, microbiology, ancillary and laboratory) are listed below for reference.     Labs:   Basic Metabolic Panel: Recent Labs  Lab 05/22/23 0821 05/22/23 1756 05/22/23 2230 05/23/23 0550 05/24/23 0535  NA 139 137  --  138 138  K 4.0 4.3  --  4.2 4.0  CL 106 107  --  108 108  CO2 26 22  --  22 24  GLUCOSE 168* 183*  --  97 115*  BUN 25* 22  --  21 20  CREATININE 0.68 0.64  --  0.53* 0.60*  CALCIUM 8.9 8.4*  --  8.3* 8.4*  MG  --   --  2.4 2.5* 2.5*  PHOS  --   --   --   --  3.4   Liver Function Tests: Recent Labs  Lab 05/22/23 0821 05/22/23 1756 05/23/23 0550 05/24/23 0535  AST 17 24 21 23   ALT 14 19 18 17   ALKPHOS 64 63 57 62  BILITOT 0.8 1.0 0.6 0.9  PROT 6.3* 6.3* 5.9* 5.9*  ALBUMIN 3.4* 3.1* 2.8* 2.9*   Recent Labs  Lab 05/22/23 1756  LIPASE 28    CBC: Recent Labs   Lab 05/22/23 0821 05/22/23 1756 05/23/23 0550 05/24/23 0535  WBC 11.5* 10.3 12.6* 8.0  NEUTROABS 7.9* 9.2* 9.8* 5.4  HGB 15.2 15.3 15.0 15.4  HCT 46.6 45.5 47.0 47.3  MCV 90.7 90.6 93.1 93.3  PLT 268 259 257 212     CBG: Recent Labs  Lab 05/23/23 1703 05/23/23 2010 05/24/23 0002 05/24/23 0536 05/24/23 1139  GLUCAP 105* 129* 146* 102* 138*     IMAGING STUDIES CT ABDOMEN PELVIS W CONTRAST  Result Date: 05/22/2023 CLINICAL DATA:  Abdominal pain. Pain around G-tube. History of esophageal cancer. EXAM: CT ABDOMEN AND PELVIS WITH CONTRAST TECHNIQUE: Multidetector CT imaging of the abdomen and pelvis was performed using the standard protocol following bolus administration of intravenous contrast. RADIATION DOSE REDUCTION: This exam was performed according to the departmental dose-optimization program which includes automated exposure control, adjustment of the mA and/or kV according to patient size and/or use of iterative reconstruction technique. CONTRAST:  OMNIPAQUE IOHEXOL 300 MG/ML  SOLN COMPARISON:  04/19/2023 FINDINGS: Lower chest: Elevation of the left hemidiaphragm. Left base atelectasis. Right lung base clear. No effusions. Heart is normal size. Hepatobiliary: Small gallstone noted, unchanged. No focal hepatic abnormality or biliary ductal dilatation. Pancreas: No focal abnormality or ductal dilatation. Spleen: No focal abnormality.  Normal size. Adrenals/Urinary Tract: Adrenal glands normal. Stable large cyst in the left kidney measuring up to 8 cm. No follow-up imaging recommended. No stones or hydronephrosis. Urinary bladder unremarkable. Stomach/Bowel: Left colonic diverticulosis. No active diverticulitis. Stomach and small bowel decompressed, unremarkable. Normal appendix. Vascular/Lymphatic: Aortic atherosclerosis. No evidence of aneurysm or adenopathy. Reproductive: Prostate enlargement. Other: No free fluid or free air. Musculoskeletal: No acute or focal osseous  abnormality. IMPRESSION: Gastrostomy tube within the stomach. No visible complicating feature. Left colonic diverticulosis.  No active diverticulitis. Cholelithiasis, unchanged. Elevation of the left hemidiaphragm.  Left base atelectasis. Prostate enlargement. Aortic atherosclerosis. Electronically Signed   By: Charlett Nose M.D.   On: 05/22/2023 21:15   DG Abdomen 1 View  Result Date: 05/22/2023 CLINICAL DATA:  Evaluate feeding tube EXAM: ABDOMEN - 1 VIEW COMPARISON:  CT 04/19/2023 FINDINGS: Feeding tube projects over the upper abdomen near the midline, exact location difficult to determine on this single view without contrast. No bowel obstruction, free air or organomegaly. IMPRESSION: Feeding tube projects over the upper abdomen near the midline. No acute findings. Electronically Signed   By: Charlett Nose M.D.   On: 05/22/2023 21:10   DG Chest 2 View  Result Date: 05/22/2023 CLINICAL DATA:  Pain around G-tube. Concern for aspiration. Esophageal cancer EXAM: CHEST - 2 VIEW COMPARISON:  None available FINDINGS: Elevation of the left hemidiaphragm. No confluent airspace opacities or effusions. Heart mediastinal contours within normal limits. No acute bony abnormality. IMPRESSION: No active cardiopulmonary disease. Electronically Signed   By: Charlett Nose M.D.   On: 05/22/2023 21:09  NM PET Image Initial (PI) Skull Base To Thigh  Result Date: 05/02/2023 CLINICAL DATA:  Initial treatment strategy for esophageal cancer. EXAM: NUCLEAR MEDICINE PET SKULL BASE TO THIGH TECHNIQUE: 8.94 mCi F-18 FDG was injected intravenously. Full-ring PET imaging was performed from the skull base to thigh after the radiotracer. CT data was obtained and used for attenuation correction and anatomic localization. Fasting blood glucose: 123. mg/dl COMPARISON:  CT of the neck and chest 04/14/2023. Abdominopelvic CT 04/19/2023. FINDINGS: Mediastinal blood pool activity: SUV max 2.5 NECK: No hypermetabolic cervical lymph nodes are  identified. No suspicious activity identified within the pharyngeal mucosal space. Incidental CT findings: none CHEST: There is long segment hypermetabolic activity within the distal thoracic esophagus, extending approximately 7.3 cm in length and demonstrating an SUV max of 8.9. Upstream esophageal dilatation with layering debris, as before. There are no enlarged or significant hypermetabolic mediastinal, hilar or axillary lymph nodes. 5 mm high right paratracheal node on image 43/6 demonstrates low-level metabolic activity (SUV max 2.3). No hypermetabolic pulmonary activity or suspicious nodularity. Incidental CT findings: Interval volume loss in the left lower lobe with paramediastinal opacity and low level metabolic activity, possibly reflecting atelectasis, although suspicious for aspiration. Mild aortic and great vessel atherosclerosis. ABDOMEN/PELVIS: There is no hypermetabolic activity within the liver, adrenal glands, spleen or pancreas. There is no hypermetabolic nodal activity in the abdomen or pelvis. Incidental CT findings: Interval percutaneous G-tube placement with low level activity surrounding the skin entrance site. Small gallstones. Stable cystic appearing lesion within the pancreatic head, measuring 1.9 cm on image 88/6, without hypermetabolic activity. Stable loculated left renal cysts measuring up to 8.3 cm on image 93/6, without hypermetabolic activity; no specific follow-up imaging recommended. Mild distal colonic diverticulosis without evidence of acute inflammation. Moderate prostatomegaly. SKELETON: There is no hypermetabolic activity to suggest osseous metastatic disease. Incidental CT findings: Mild spondylosis. Previously noted sclerotic lesions in the T2-L5 vertebral bodies demonstrate no associated hypermetabolic activity and are likely benign incidental findings. IMPRESSION: 1. Long segment hypermetabolic activity within the distal thoracic esophagus, consistent with known esophageal  cancer. 2. No evidence of metastatic disease. A small high right paratracheal lymph node demonstrates low-level metabolic activity and is nonspecific, possibly reactive. 3. Interval volume loss in the left lower lobe with paramediastinal opacity and low level metabolic activity, which could reflect atelectasis or aspiration. 4. Stable cystic appearing-lesion within the pancreatic head, without hypermetabolic activity, likely a side branch IPMN. Given the patient's history, suggest attention on follow-up imaging for the patient's esophageal cancer. 5. Interval percutaneous G-tube placement with low level activity surrounding the skin entrance site. 6.  Aortic Atherosclerosis (ICD10-I70.0). Electronically Signed   By: Carey Bullocks M.D.   On: 05/02/2023 17:37    DISCHARGE EXAMINATION: Vitals:   05/23/23 2251 05/24/23 0500 05/24/23 0504 05/24/23 1143  BP: 110/74  138/87 120/87  Pulse: 84  96 (!) 103  Resp: 18  20   Temp: 98.8 F (37.1 C)  98.2 F (36.8 C) 99.5 F (37.5 C)  TempSrc: Oral  Oral   SpO2: 99%  94% 96%  Weight:  76.7 kg    Height:       General appearance: Awake alert.  In no distress Resp: Clear to auscultation bilaterally.  Normal effort Cardio: S1-S2 is normal regular.  No S3-S4.  No rubs murmurs or bruit GI: Abdomen is soft.  Nontender nondistended.  Bowel sounds are present normal.  No masses organomegaly.  PEG tube noted    DISPOSITION: Home  Discharge  Instructions     Call MD for:  difficulty breathing, headache or visual disturbances   Complete by: As directed    Call MD for:  extreme fatigue   Complete by: As directed    Call MD for:  persistant dizziness or light-headedness   Complete by: As directed    Call MD for:  persistant nausea and vomiting   Complete by: As directed    Call MD for:  severe uncontrolled pain   Complete by: As directed    Call MD for:  temperature >100.4   Complete by: As directed    Discharge instructions   Complete by: As directed     Please be sure to follow-up with Dr. Mosetta Putt for further management of your cancer.  You were cared for by a hospitalist during your hospital stay. If you have any questions about your discharge medications or the care you received while you were in the hospital after you are discharged, you can call the unit and asked to speak with the hospitalist on call if the hospitalist that took care of you is not available. Once you are discharged, your primary care physician will handle any further medical issues. Please note that NO REFILLS for any discharge medications will be authorized once you are discharged, as it is imperative that you return to your primary care physician (or establish a relationship with a primary care physician if you do not have one) for your aftercare needs so that they can reassess your need for medications and monitor your lab values. If you do not have a primary care physician, you can call [phone] for a physician referral.   Increase activity slowly   Complete by: As directed          Allergies as of 05/24/2023       Reactions   Penicillins    Shortness of breath   Sulfa Antibiotics    Blisters   Procaine Other (See Comments)   Caused fatigue        Medication List     STOP taking these medications    dexamethasone 4 MG tablet Commonly known as: DECADRON   lidocaine-prilocaine cream Commonly known as: EMLA   Multivitamin Men Tabs   ondansetron 4 MG disintegrating tablet Commonly known as: ZOFRAN-ODT   ondansetron 8 MG tablet Commonly known as: Zofran   prochlorperazine 10 MG tablet Commonly known as: COMPAZINE       TAKE these medications    Accu-Chek Guide Me w/Device Kit USE AS DIRECTED 3 TIMES A DAY   Accu-Chek Guide test strip Generic drug: glucose blood Use as instructed daily   apixaban 5 MG Tabs tablet Commonly known as: ELIQUIS Place 1 tablet (5 mg total) into feeding tube 2 (two) times daily.   feeding supplement  (OSMOLITE 1.5 CAL) Liqd Place 237 mLs into feeding tube 6 (six) times daily.   free water Soln Place 120 mLs into feeding tube 6 (six) times daily.          Follow-up Information     [facility] Follow up.   Specialty: Home Health Services Why: [facility] will follow up with you at discharge to provide home health services Contact information: [address] [place] [address] [phone]         [name], MD Follow up.   Specialty: Family Medicine Contact information: [address] [place] [address] [place] [address] [phone]         Mosetta Putt,  Terrace Arabia, MD Follow up.   Specialties: Hematology, Oncology Contact information: [address] [place] [address] [phone]                 TOTAL DISCHARGE TIME: 35 minutes  Chelsey Kimberley Rito Ehrlich  [facility] Pager on [email or web address]  05/25/2023, 10:57 AM

## 2023-05-25 ENCOUNTER — Telehealth: Payer: Self-pay

## 2023-05-25 ENCOUNTER — Other Ambulatory Visit: Payer: Self-pay | Admitting: Family Medicine

## 2023-05-25 ENCOUNTER — Ambulatory Visit
Admission: RE | Admit: 2023-05-25 | Discharge: 2023-05-25 | Disposition: A | Discharge status: Home / Self Care | Payer: 59 | Source: Ambulatory Visit | Attending: Radiation Oncology | Admitting: Radiation Oncology

## 2023-05-25 ENCOUNTER — Other Ambulatory Visit: Payer: Self-pay

## 2023-05-25 DIAGNOSIS — Z7901 Long term (current) use of anticoagulants: Secondary | ICD-10-CM | POA: Diagnosis not present

## 2023-05-25 DIAGNOSIS — Z931 Gastrostomy status: Secondary | ICD-10-CM | POA: Diagnosis not present

## 2023-05-25 DIAGNOSIS — Z5111 Encounter for antineoplastic chemotherapy: Secondary | ICD-10-CM | POA: Diagnosis not present

## 2023-05-25 DIAGNOSIS — E43 Unspecified severe protein-calorie malnutrition: Secondary | ICD-10-CM | POA: Diagnosis not present

## 2023-05-25 DIAGNOSIS — C155 Malignant neoplasm of lower third of esophagus: Secondary | ICD-10-CM | POA: Diagnosis not present

## 2023-05-25 DIAGNOSIS — Z510 Encounter for antineoplastic radiation therapy: Secondary | ICD-10-CM | POA: Diagnosis not present

## 2023-05-25 LAB — RAD ONC ARIA SESSION SUMMARY
Course Elapsed Days: 3
Plan Fractions Treated to Date: 4
Plan Prescribed Dose Per Fraction: 1.8 Gy
Plan Total Fractions Prescribed: 25
Plan Total Prescribed Dose: 45 Gy
Reference Point Dosage Given to Date: 7.2 Gy
Reference Point Session Dosage Given: 1.8 Gy
Session Number: 4

## 2023-05-25 NOTE — Telephone Encounter (Signed)
From [facility] call:   The patient is requesting a refill of Eliquis be sent to [facility] as soon a possible  He said he has enough for tonight and tomorrow and is very anxious about getting the refill in time so he does not miss a dose.

## 2023-05-25 NOTE — Telephone Encounter (Signed)
Medication Refill -  Most Recent Primary Care Visit:  Provider: [name]  Department: [facility]  Visit Type: PHYSICAL  Date: 12/15/2022  Medication: apixaban (ELIQUIS) 5 MG TABS tablet [[id number]]   Has the patient contacted their pharmacy? Yes ( Is this the correct pharmacy for this prescription? Yes If no, delete pharmacy and type the correct one.  This is the patient's preferred pharmacy:  [id number] [place], [place] - [address] AT [facility] [address] [place] [address] Phone: [phone] Fax: [phone]   Has the prescription been filled recently? Yes  Is the patient out of the medication? Yes  Has the patient been seen for an appointment in the last year OR does the patient have an upcoming appointment? Yes  Can we respond through MyChart? Yes  Agent: Please be advised that Rx refills may take up to 3 business days. We ask that you follow-up with your pharmacy.

## 2023-05-25 NOTE — Transitions of Care (Post Inpatient/ED Visit) (Signed)
05/25/2023  Name: Samuel [name] MRN: [id number] DOB: 1945/08/11  Today's TOC FU Call Status: Today's TOC FU Call Status:: Successful TOC FU Call Completed TOC FU Call Complete Date: 05/25/23 Patient's Name and Date of Birth confirmed.  Transition Care Management Follow-up Telephone Call Date of Discharge: 05/24/23 Discharge Facility: [facility]) Type of Discharge: Inpatient Admission Primary Inpatient Discharge Diagnosis:: generalized weakness How have you been since you were released from the hospital?: Better (He said he just had a radiation treatment this morning.) Any questions or concerns?: No  Items Reviewed: Did you receive and understand the discharge instructions provided?: Yes Medications obtained,verified, and reconciled?: Yes (Medications Reviewed) (He has the eliquis; but needs a refill. He stated he only has enough for today and tomorrow. I tod him I would notify PCP.  He also said that he was instructed to have robitussin and sennokot on hand in the event he needs to administer them.) Any new allergies since your discharge?: No Dietary orders reviewed?: Yes Type of Diet Ordered:: NPO- He self administers his Osmolite PEG feedings 4 x/day as instructed and supplements with Prosource 3x/day and a total of 2 L water /day via PEG Do you have support at home?: Yes People in Home: spouse Name of Support/Comfort Primary Source: his wife and he said he also receives alot of support from the community  Medications Reviewed Today: Medications Reviewed Today     Reviewed by Robyne Peers, RN (Case Manager) on 05/25/23 at 1338  Med List Status: <None>   Medication Order Taking? Sig Documenting Provider Last Dose Status Informant  apixaban (ELIQUIS) 5 MG TABS tablet [id number] No Place 1 tablet (5 mg total) into feeding tube 2 (two) times daily. Azucena Fallen, MD 05/22/2023 0530 Active Self, Pharmacy Records  Blood Glucose Monitoring Suppl (ACCU-CHEK GUIDE ME)  w/Device KIT [id number] No USE AS DIRECTED 3 TIMES A Harlan Stains, MD Taking Active Self, Pharmacy Records           Med Note Robyne Peers   Thu May 25, 2023  1:37 PM) He said he is in the process of getting a new glucometer  glucose blood (ACCU-CHEK GUIDE) test strip [id number] No Use as instructed daily [name], MD Taking Active Self, Pharmacy Records           Med Note Terrall Laity May 25, 2023  1:37 PM) He said he is in the process of getting a new glucometer  Nutritional Supplements (FEEDING SUPPLEMENT, OSMOLITE 1.5 CAL,) LIQD [id number] No Place 237 mLs into feeding tube 6 (six) times daily. Azucena Fallen, MD 05/22/2023 Active Self, Pharmacy Records           Med Note Robyne Peers   Thu May 25, 2023  1:38 PM) He it administering the feedings 4x/day as instructed.  He stated he receives the same calories just in 4 feedings instead of 6.   Water For Irrigation, Sterile (FREE WATER) SOLN [id number] No Place 120 mLs into feeding tube 6 (six) times daily. Azucena Fallen, MD 05/22/2023 Active Self, Pharmacy Records            Home Care and Equipment/Supplies: Were Home Health Services Ordered?: Yes Name of Home Health Agency:: [facility] and OT Has Agency set up a time to come to your home?: Yes First Home Health Visit Date: 05/27/23 Any new equipment or medical supplies ordered?: No  Functional Questionnaire: Do you need assistance with bathing/showering or dressing?: No Do  you need assistance with meal preparation?: No (NPO) Do you need assistance with eating?: No (NPO) Do you have difficulty maintaining continence: No Do you need assistance with getting out of bed/getting out of a chair/moving?: Yes (He said he is trying to maintain his strength and has been ambulating without an assistive device but he has a cane to use if needed) Do you have difficulty managing or taking your medications?: No (adminstered via PEG. He needs a new glucometer and  said that a friend is helping him get a new one.)  Follow up appointments reviewed: PCP Follow-up appointment confirmed?: Yes Date of PCP follow-up appointment?: 06/22/23 Follow-up Provider: [facility] Follow-up appointment confirmed?: Yes Date of Specialist follow-up appointment?: 05/25/23 Follow-Up Specialty Provider:: He has multiple appointments scheduled for radiation treatments as well as with oncology. Do you need transportation to your follow-up appointment?: No Do you understand care options if your condition(s) worsen?: Yes-patient verbalized understanding    SIGNATURE Robyne Peers, RN

## 2023-05-26 ENCOUNTER — Other Ambulatory Visit: Payer: Self-pay

## 2023-05-26 ENCOUNTER — Telehealth: Payer: Self-pay

## 2023-05-26 ENCOUNTER — Ambulatory Visit
Admission: RE | Admit: 2023-05-26 | Discharge: 2023-05-26 | Disposition: A | Discharge status: Home / Self Care | Payer: 59 | Source: Ambulatory Visit | Attending: Radiation Oncology

## 2023-05-26 ENCOUNTER — Ambulatory Visit
Admission: RE | Admit: 2023-05-26 | Discharge: 2023-05-26 | Disposition: A | Discharge status: Home / Self Care | Payer: 59 | Source: Ambulatory Visit | Attending: Radiation Oncology | Admitting: Radiation Oncology

## 2023-05-26 DIAGNOSIS — C155 Malignant neoplasm of lower third of esophagus: Secondary | ICD-10-CM | POA: Diagnosis not present

## 2023-05-26 DIAGNOSIS — Z931 Gastrostomy status: Secondary | ICD-10-CM | POA: Diagnosis not present

## 2023-05-26 DIAGNOSIS — Z7901 Long term (current) use of anticoagulants: Secondary | ICD-10-CM | POA: Diagnosis not present

## 2023-05-26 DIAGNOSIS — Z5111 Encounter for antineoplastic chemotherapy: Secondary | ICD-10-CM | POA: Diagnosis not present

## 2023-05-26 DIAGNOSIS — Z510 Encounter for antineoplastic radiation therapy: Secondary | ICD-10-CM | POA: Diagnosis not present

## 2023-05-26 DIAGNOSIS — E43 Unspecified severe protein-calorie malnutrition: Secondary | ICD-10-CM | POA: Diagnosis not present

## 2023-05-26 LAB — RAD ONC ARIA SESSION SUMMARY
Course Elapsed Days: 4
Plan Fractions Treated to Date: 5
Plan Prescribed Dose Per Fraction: 1.8 Gy
Plan Total Fractions Prescribed: 25
Plan Total Prescribed Dose: 45 Gy
Reference Point Dosage Given to Date: 9 Gy
Reference Point Session Dosage Given: 1.8 Gy
Session Number: 5

## 2023-05-26 MED ORDER — APIXABAN 5 MG PO TABS: 5.0000 mg | ORAL_TABLET | Freq: Two times a day (BID) | ORAL | 2 refills | Status: DC

## 2023-05-26 MED ORDER — SONAFINE EX EMUL
1.0000 | Freq: Once | CUTANEOUS | Status: AC
  Administered 2023-05-26: 1 via TOPICAL

## 2023-05-26 NOTE — Patient Instructions (Signed)
Visit Information  Thank you for taking time to visit with me today. Always my honor and privilege to work with you! Please don't hesitate to contact me if I can be of assistance to you before our next scheduled telephone appointment.  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   RNCM Clinical Goal(s):  Patient will work with the Care Management team over the next 30 days to address Transition of Care Barriers: Need for Oncology follow up on malignant-appearing esophageal stenosis/biopsy results with Dr. Mosetta Putt on 04/24/23; Adjusting to NPO status and need for Osmolite 1.5 tube feedings to address protein calorie malnutrition  through collaboration with RN Care manager, provider, and care team. 11/1 Update - Patient has had appt w/ Dr. Mosetta Putt and will be pursuing chemo/radiation as soon as possible, as well as planning for Esophagectomy once he is strong enough for surgery. Patient has an excellent mindset, positive attitude, and is pro-active in his personal healthcare management. In order to undergo an esophagectomy, however, patient needs to increase his protein intake(noted for having severe protein cal malnutrition) and increase his overall physical stamina/conditioning from current deconditioning status d/t recent hospitlaization before undergoing surgery to remove part or all of the esophagus and then having the "esophagus" rebuilt from part of the stomach with the end result = removal of cancerous  tissue and ability to eventually eat solid food again. 11/15 Update - patient starting chemotherapy and radiation treatment twice weekly at the [facility] starting Monday, 11/18. Received education and tours from "[facility]" of Oncology and assigned an Oncology Nurse Navigator named [place]. Oriented to Infusion center & staff as well as Radiation department staff. Has appointment with Surgeon, Dr. Brynda Greathouse at 12:30 today, 11/15 to discuss probable esophagectomy following chemotherapy/radiation treatment. 11/22 Update - undergoing chemotherapy and radiation at [facility], was briefly hospitalized (observation status) on Monday, 11/18 through Wednesday 11/20 due to esophageal pain and spasms post first chemotherapy and radiation treatment, generalized weakness, and inability to take in any nutrition (NPO and was unable to administer tube feeding due to pain and spasming. Patient has also decided not to pursue 9-hour, complicated and dangerous esophagectomy after speaking with Dr. Cliffton Asters who recommended not pursuing due to high risk for death  Interventions: Evaluation of current treatment plan related to  self management and patient's adherence to plan as established by provider   Oncology:  (Status: ONGOING) Short Term Goal Assessment of understanding of oncology diagnosis: 11/1 patient is highly intelligent and has an excellent understanding of his oncology diagnosis Assessed patient understanding of cancer diagnosis and recommended treatment plan Reviewed upcoming provider appointments and treatment appointments Assessed available transportation to appointments and treatments. Has consistent/reliable transportation: Yes Assessed support system. Has consistent/reliable family or other support: Yes PHQ2/PHQ9 performed Nutrition assessment performed  Patient Goals/Self-Care Activities: Participate in Transition of Care Program/Attend TOC scheduled calls Take all medications as  prescribed Call pharmacy for medication refills 3-7 days in advance of running  out of medications Call provider office for new concerns or questions   Follow Up Plan:  The patient has been provided with contact information for the care management team and has been advised to call with any health related questions or concerns.          Patient verbalizes understanding of instructions and care plan provided today and agrees to view in MyChart. Active MyChart status and patient understanding of how to access instructions and care plan via MyChart confirmed with patient.     The patient has been provided with contact information for the care management team and has been advised to call with any health related questions or concerns.   Please call the care guide team at [phone] if you need to cancel or reschedule your appointment.   Please call 1-800-273-TALK (toll free, 24 hour hotline) if you are experiencing a Mental Health or Behavioral Health Crisis or need someone to talk to.  [name], RN, BA, [facility], CRRN [facility] Coordinator, Transition of Care Ph # [phone]

## 2023-05-26 NOTE — Telephone Encounter (Signed)
Refill has been sent to his pharmacy

## 2023-05-26 NOTE — Telephone Encounter (Signed)
He can take melatonin 3 to 5 mg.

## 2023-05-26 NOTE — Patient Outreach (Signed)
Care Management  Transitions of Care Program Transitions of Care Post-discharge week 3   05/26/2023 Name: Samuel [name] MRN: [id number] DOB: 23-Feb-1946  Subjective: Samuel [name] is a 77 y.o. year old male who is a primary care patient of Samuel Register, MD. The Care Management team Engaged with patient Engaged with patient by telephone to assess and address transitions of care needs.   Consent to Services:  Patient was given information about care management services, agreed to services, and gave verbal consent to participate.   Assessment:           SDOH Interventions    Flowsheet Row ED to Hosp-Admission (Discharged) from 05/22/2023 in [name] Clinical Support from 05/08/2023 in [name]  SDOH Interventions    Transportation Interventions Inpatient TOC, Other (Comment)  [Resource added to AVS] Other (Comment)  [referred to transportation department]        Goals Addressed             This Visit's Progress    Transition of Care       Current Barriers:  Chronic Disease Management support and education needs related to Esophageal stenosis and rapid onset of dysphagia, initially solids transitioning to liquids, now unable to tolerate any po without severe pleuritic chest pain.G-tube placement on 10/17, NPO.   RNCM Clinical Goal(s):  Patient will work with the Care Management team over the next 30 days to address Transition of Care Barriers: Need for Oncology follow up on malignant-appearing esophageal stenosis/biopsy results with Samuel [name] on 04/24/23; Adjusting to NPO status and need for Osmolite 1.5 tube feedings to address protein calorie malnutrition  through collaboration with RN Care manager, provider, and care team. 11/1 Update - Patient has had appt w/ [name] and will be pursuing chemo/radiation as soon as possible, as well as planning for Esophagectomy  once he is strong enough for surgery. Patient has an excellent mindset, positive attitude, and is pro-active in his personal healthcare management. In order to undergo an esophagectomy, however, patient needs to increase his protein intake(noted for having severe protein cal malnutrition) and increase his overall physical stamina/conditioning from current deconditioning status d/t recent hospitlaization before undergoing surgery to remove part or all of the esophagus and then having the "esophagus" rebuilt from part of the stomach with the end result = removal of cancerous tissue and ability to eventually eat solid food again. 11/15 Update - patient starting chemotherapy and radiation treatment twice weekly at the [name] starting Monday, 11/18. Received education and tours from "[name]" of Oncology and assigned an Oncology Nurse Navigator named [place]. Oriented to Infusion center & staff as well as Radiation department staff. Has appointment with Surgeon, [name] at 12:30 today, 11/15 to discuss probable esophagectomy following chemotherapy/radiation treatment. 11/22 Update - undergoing chemotherapy and radiation at [name], was briefly hospitalized (observation status) on Monday, 11/18 through Wednesday 11/20 due to esophageal pain and spasms post first chemotherapy and radiation treatment, generalized weakness, and inability to take in any nutrition (NPO and was unable to administer tube feeding due to pain and spasming. Patient has also decided not to pursue 9-hour, complicated and dangerous esophagectomy after speaking with Dr. Cliffton [name] who recommended not pursuing due to high risk for death  Interventions: Evaluation of current treatment plan related to  self management and patient's adherence to plan as established by provider   Oncology:  (Status:  ONGOING) Short Term Goal Assessment of understanding of oncology diagnosis: 11/1 patient is highly intelligent and has an excellent  understanding of his oncology diagnosis Assessed patient understanding of cancer diagnosis and recommended treatment plan Reviewed upcoming provider appointments and treatment appointments Assessed available transportation to appointments and treatments. Has consistent/reliable transportation: Yes Assessed support system. Has consistent/reliable family or other support: Yes PHQ2/PHQ9 performed Nutrition assessment performed  Patient Goals/Self-Care Activities: Participate in Transition of Care Program/Attend TOC scheduled calls Take all medications as prescribed Call pharmacy for medication refills 3-7 days in advance of running out of medications Call provider office for new concerns or questions   Follow Up Plan:  The patient has been provided with contact information for the care management team and has been advised to call with any health related questions or concerns.          Plan: The patient has been provided with contact information for the care management team and has been advised to call with any health related questions or concerns.   Samuel Low, RN, BA, [facility], CRRN [name] Coordinator, Transition of Care Ph # [phone]

## 2023-05-26 NOTE — Telephone Encounter (Signed)
I called the patient and shared Dr Baxter Flattery recommendation of Melatonin 3-5 mg.  He said he understood and was very Adult nurse

## 2023-05-26 NOTE — Telephone Encounter (Signed)
I called the patient to inform him that the prescription for eliquis has been sent to his pharmacy.  He was very appreciative and then said that he has been having trouble sleeping and one of his health care providers recommended melatonin. He would like to know if Dr Alvis Lemmings would recommend that for him and if so what strength, or if there is something else he could take for sleep.

## 2023-05-27 DIAGNOSIS — I2699 Other pulmonary embolism without acute cor pulmonale: Secondary | ICD-10-CM | POA: Diagnosis not present

## 2023-05-27 DIAGNOSIS — C159 Malignant neoplasm of esophagus, unspecified: Secondary | ICD-10-CM | POA: Diagnosis not present

## 2023-05-27 DIAGNOSIS — Z931 Gastrostomy status: Secondary | ICD-10-CM | POA: Diagnosis not present

## 2023-05-27 DIAGNOSIS — E119 Type 2 diabetes mellitus without complications: Secondary | ICD-10-CM | POA: Diagnosis not present

## 2023-05-27 DIAGNOSIS — Z9181 History of falling: Secondary | ICD-10-CM | POA: Diagnosis not present

## 2023-05-27 DIAGNOSIS — E8809 Other disorders of plasma-protein metabolism, not elsewhere classified: Secondary | ICD-10-CM | POA: Diagnosis not present

## 2023-05-27 DIAGNOSIS — K8020 Calculus of gallbladder without cholecystitis without obstruction: Secondary | ICD-10-CM | POA: Diagnosis not present

## 2023-05-27 DIAGNOSIS — K5730 Diverticulosis of large intestine without perforation or abscess without bleeding: Secondary | ICD-10-CM | POA: Diagnosis not present

## 2023-05-27 DIAGNOSIS — J9811 Atelectasis: Secondary | ICD-10-CM | POA: Diagnosis not present

## 2023-05-27 DIAGNOSIS — I5032 Chronic diastolic (congestive) heart failure: Secondary | ICD-10-CM | POA: Diagnosis not present

## 2023-05-27 DIAGNOSIS — I700 Atherosclerosis of aorta: Secondary | ICD-10-CM | POA: Diagnosis not present

## 2023-05-27 DIAGNOSIS — Z85820 Personal history of malignant melanoma of skin: Secondary | ICD-10-CM | POA: Diagnosis not present

## 2023-05-27 DIAGNOSIS — Z7901 Long term (current) use of anticoagulants: Secondary | ICD-10-CM | POA: Diagnosis not present

## 2023-05-27 DIAGNOSIS — E860 Dehydration: Secondary | ICD-10-CM | POA: Diagnosis not present

## 2023-05-27 DIAGNOSIS — Z96653 Presence of artificial knee joint, bilateral: Secondary | ICD-10-CM | POA: Diagnosis not present

## 2023-05-27 DIAGNOSIS — Z87891 Personal history of nicotine dependence: Secondary | ICD-10-CM | POA: Diagnosis not present

## 2023-05-28 NOTE — Assessment & Plan Note (Signed)
-  [id number] -Patient presented to hospital with progressive dysphagia and weight loss. -EGD showed a malignant appearing severe stenosis 30 to 34 cm from incisors.  The stenosis measured 4 cm, the scope was not able to pass.  Biopsy confirmed adenocarcinoma -He underwent PEG feeding tube for nutrition -PET scan showed hypermetabolic lesion in the distal esophagus, no nodal or distant metastasis.   -pt was evaluated by cardiothoracic surgeon Dr. Cliffton Asters, he is not a good candidate for surgery, but will re-evaluate him after chemoradiation -Plan to start concurrent chemoradiation with weekly carbo platinum and Taxol today 05/22/23 -today is cycle 1 day 8

## 2023-05-28 NOTE — Progress Notes (Unsigned)
Patient Care Team: [name], MD as PCP - General (Family Medicine) [name], MD as PCP - Cardiology (Cardiology) [name], MD as Consulting Physician (Hematology and Oncology)  Clinic Day:  05/30/2023  Referring physician: [name], MD  ASSESSMENT & PLAN:   Assessment & Plan: Malignant neoplasm of esophagus (HCC) -cTxN0M0 -Patient presented to hospital with progressive dysphagia and weight loss. -EGD showed a malignant appearing severe stenosis 30 to 34 cm from incisors.  The stenosis measured 4 cm, the scope was not able to pass.  Biopsy confirmed adenocarcinoma -He underwent PEG feeding tube for nutrition -PET scan showed hypermetabolic lesion in the distal esophagus, no nodal or distant metastasis.   -pt was evaluated by cardiothoracic surgeon Dr. Cliffton Asters, he is not a good candidate for surgery, but will re-evaluate him after chemoradiation -Plan to start concurrent chemoradiation with weekly carbo platinum and Taxol today 05/22/23 -today is cycle 1 day 8   Plan: Labs reviewed  -CBC showing WBC 8.4; Hgb 14.2; Hct 43.8; Plt 214; Anc 6.4 -CMP - K 4.3; glucose 12.7; BUN 29; Creatinine 0.59; eGFR > 60; Ca 8.7; LFTs normal.   Reviewed recent hospitalization record with the patient. Unclear why PRN medications d/c  from medication list. Added back. No need for refills today per patient.  Labs adequate for treatment today  Proceed with treatment with carboplatin and taxol as scheduled  Labs with follow up and treatment as scheduled   The patient understands the plans discussed today and is in agreement with them.  He knows to contact our office if he develops concerns prior to his next appointment.  I provided 25 minutes of face-to-face time during this encounter and > 50% was spent counseling as documented under my assessment and plan.    Carlean Jews, NP  [facility] - A DEPT OF [facility] [address] [place] [address] Dept: [phone] Dept Fax: [phone]   No orders of the defined types were placed in this encounter.     CHIEF COMPLAINT:  CC: malignant neoplasm of lower third of esophagus  Current Treatment:  concurrent  chemoradiation with weekly carboplatin and paclitaxol   INTERVAL HISTORY:  Samuel [name] is here today for repeat clinical assessment. Last saw Dr. Mosetta Putt on 05/22/2023. Started concurrent  chemoradiation.  He states that he had some "queasy" stomach sensation after initial dose of chemo.  Needed nausea relief medication only once.  States he is doing ice baths for fingers and feet during chemotherapy.  Initially had some cold sensitivity.  Lasted a few days and resolved.  Reports feeling well in general.  Difficult to gauge appetite as he uses a feeding tube for nutrition.  Did have hospitalization due to abdominal pain and was found to have semiobstructed feeding tube.  This was adjusted.  He was visited by nutritionist who helped him get the correct supplies.  Has had no problems since then.  Tolerating tube feeds better now.  He denies fevers or chills. He denies pain.  His weight has decreased 3 pounds over last 1 week .  I have reviewed the past medical history, past surgical history, social history and family history with the patient and they are unchanged from previous note.  ALLERGIES:  is allergic to penicillins, sulfa antibiotics, and procaine.  MEDICATIONS:  Current Outpatient Medications  Medication Sig Dispense Refill   apixaban (ELIQUIS) 5 MG TABS tablet Place 1 tablet (5 mg total) into  feeding tube 2 (two) times daily. 60 tablet 2   Blood Glucose Monitoring Suppl (ACCU-CHEK GUIDE ME) w/Device KIT USE AS DIRECTED 3 TIMES A DAY 1 kit 0   dexamethasone (DECADRON) 4 MG tablet Take 2 tablets daily for 2 days, start the day after chemotherapy. Take with food.     glucose blood (ACCU-CHEK GUIDE) test strip Use as instructed daily 100 each 12    ondansetron (ZOFRAN) 8 MG tablet Take 1 tablet (8 mg total) by mouth every 8 (eight) hours as needed for nausea or vomiting. Start on the third day after chemotherapy.     prochlorperazine (COMPAZINE) 10 MG tablet Take 1 tablet (10 mg total) by mouth every 6 (six) hours as needed for nausea or vomiting.     Water For Irrigation, Sterile (FREE WATER) SOLN Place 120 mLs into feeding tube 6 (six) times daily. 24000 mL 0   No current facility-administered medications for this visit.    HISTORY OF PRESENT ILLNESS:   Oncology History  Malignant neoplasm of esophagus (HCC)  04/18/2023 Cancer Staging   Staging form: Esophagus - Adenocarcinoma, AJCC 8th Edition - Clinical stage from 04/18/2023: Stage Unknown (cTX, cN0, cM0) - Signed by [name], MD on 05/04/2023 Total positive nodes: 0   04/20/2023 Initial Diagnosis   Malignant neoplasm of lower third of esophagus (HCC)   05/22/2023 -  Chemotherapy   Patient is on Treatment Plan : ESOPHAGUS Carboplatin + Paclitaxel Weekly X 6 Weeks with XRT         REVIEW OF SYSTEMS:   Constitutional: Denies fevers or chills.  Has lost 3 pounds since most recent visit. Eyes: Denies blurriness of vision Ears, nose, mouth, throat, and face: Denies mucositis or sore throat Respiratory: Denies cough, dyspnea or wheezes Cardiovascular: Denies palpitation, chest discomfort or lower extremity swelling Gastrointestinal:  Denies nausea, heartburn or change in bowel habits.  Tolerating tube feeds better since hospitalization. Skin: Denies abnormal skin rashes Lymphatics: Denies new lymphadenopathy or easy bruising Neurological:Denies numbness, tingling or new weaknesses.  Has had very mild cold sensitivity few days after treatment.  Resolved currently. Behavioral/Psych: Mood is stable, no new changes  All other systems were reviewed with the patient and are negative.   VITALS:   Today's Vitals   05/29/23 1031  BP: 118/72  Pulse: (!) 102  Resp: 17  Temp:  98 F (36.7 C)  TempSrc: Temporal  SpO2: 95%  PainSc: 0-No pain   There is no height or weight on file to calculate BMI.   Wt Readings from Last 3 Encounters:  05/29/23 170 lb 4 oz (77.2 kg)  05/24/23 169 lb 3.2 oz (76.7 kg)  05/22/23 172 lb 14.4 oz (78.4 kg)    There is no height or weight on file to calculate BMI.  Performance status (ECOG): 1 - Symptomatic but completely ambulatory  PHYSICAL EXAM:   GENERAL:alert, no distress and comfortable SKIN: skin color, texture, turgor are normal, no rashes or significant lesions EYES: normal, Conjunctiva are pink and non-injected, sclera clear OROPHARYNX:no exudate, no erythema and lips, buccal mucosa, and tongue normal  NECK: supple, thyroid normal size, non-tender, without nodularity LYMPH:  no palpable lymphadenopathy in the cervical, axillary or inguinal LUNGS: clear to auscultation and percussion with normal breathing effort HEART: regular rate & rhythm and no murmurs and no lower extremity edema ABDOMEN:abdomen soft, non-tender and normal bowel sounds.  Intact feeding tube. Musculoskeletal:no cyanosis of digits and no clubbing  NEURO: alert & oriented x 3 with fluent speech,  no focal motor/sensory deficits  LABORATORY DATA:  I have reviewed the data as listed    Component Value Date/Time   NA 137 05/29/2023 1006   NA 144 08/09/2022 1425   K 4.3 05/29/2023 1006   CL 106 05/29/2023 1006   CO2 25 05/29/2023 1006   GLUCOSE 127 (H) 05/29/2023 1006   BUN 29 (H) 05/29/2023 1006   BUN 15 08/09/2022 1425   CREATININE 0.59 (L) 05/29/2023 1006   CALCIUM 8.7 (L) 05/29/2023 1006   PROT 6.2 (L) 05/29/2023 1006   PROT 6.5 08/09/2022 1425   ALBUMIN 3.3 (L) 05/29/2023 1006   ALBUMIN 4.2 08/09/2022 1425   AST 16 05/29/2023 1006   ALT 14 05/29/2023 1006   ALKPHOS 71 05/29/2023 1006   BILITOT 0.6 05/29/2023 1006   GFRNONAA >60 05/29/2023 1006   GFRAA 83 06/15/2015 0941    Lab Results  Component Value Date   WBC 8.4 05/29/2023    NEUTROABS 6.4 05/29/2023   HGB 14.2 05/29/2023   HCT 43.8 05/29/2023   MCV 90.1 05/29/2023   PLT 214 05/29/2023     RADIOGRAPHIC STUDIES: CT ABDOMEN PELVIS W CONTRAST  Result Date: 05/22/2023 CLINICAL DATA:  Abdominal pain. Pain around G-tube. History of esophageal cancer. EXAM: CT ABDOMEN AND PELVIS WITH CONTRAST TECHNIQUE: Multidetector CT imaging of the abdomen and pelvis was performed using the standard protocol following bolus administration of intravenous contrast. RADIATION DOSE REDUCTION: This exam was performed according to the departmental dose-optimization program which includes automated exposure control, adjustment of the mA and/or kV according to patient size and/or use of iterative reconstruction technique. CONTRAST:  OMNIPAQUE IOHEXOL 300 MG/ML  SOLN COMPARISON:  04/19/2023 FINDINGS: Lower chest: Elevation of the left hemidiaphragm. Left base atelectasis. Right lung base clear. No effusions. Heart is normal size. Hepatobiliary: Small gallstone noted, unchanged. No focal hepatic abnormality or biliary ductal dilatation. Pancreas: No focal abnormality or ductal dilatation. Spleen: No focal abnormality.  Normal size. Adrenals/Urinary Tract: Adrenal glands normal. Stable large cyst in the left kidney measuring up to 8 cm. No follow-up imaging recommended. No stones or hydronephrosis. Urinary bladder unremarkable. Stomach/Bowel: Left colonic diverticulosis. No active diverticulitis. Stomach and small bowel decompressed, unremarkable. Normal appendix. Vascular/Lymphatic: Aortic atherosclerosis. No evidence of aneurysm or adenopathy. Reproductive: Prostate enlargement. Other: No free fluid or free air. Musculoskeletal: No acute or focal osseous abnormality. IMPRESSION: Gastrostomy tube within the stomach. No visible complicating feature. Left colonic diverticulosis.  No active diverticulitis. Cholelithiasis, unchanged. Elevation of the left hemidiaphragm.  Left base atelectasis. Prostate  enlargement. Aortic atherosclerosis. Electronically Signed   By: Charlett Nose M.D.   On: 05/22/2023 21:15   DG Abdomen 1 View  Result Date: 05/22/2023 CLINICAL DATA:  Evaluate feeding tube EXAM: ABDOMEN - 1 VIEW COMPARISON:  CT 04/19/2023 FINDINGS: Feeding tube projects over the upper abdomen near the midline, exact location difficult to determine on this single view without contrast. No bowel obstruction, free air or organomegaly. IMPRESSION: Feeding tube projects over the upper abdomen near the midline. No acute findings. Electronically Signed   By: Charlett Nose M.D.   On: 05/22/2023 21:10   DG Chest 2 View  Result Date: 05/22/2023 CLINICAL DATA:  Pain around G-tube. Concern for aspiration. Esophageal cancer EXAM: CHEST - 2 VIEW COMPARISON:  None available FINDINGS: Elevation of the left hemidiaphragm. No confluent airspace opacities or effusions. Heart mediastinal contours within normal limits. No acute bony abnormality. IMPRESSION: No active cardiopulmonary disease. Electronically Signed   By: Charlett Nose M.D.  On: 05/22/2023 21:09   NM PET Image Initial (PI) Skull Base To Thigh  Result Date: 05/02/2023 CLINICAL DATA:  Initial treatment strategy for esophageal cancer. EXAM: NUCLEAR MEDICINE PET SKULL BASE TO THIGH TECHNIQUE: 8.94 mCi F-18 FDG was injected intravenously. Full-ring PET imaging was performed from the skull base to thigh after the radiotracer. CT data was obtained and used for attenuation correction and anatomic localization. Fasting blood glucose: 123. mg/dl COMPARISON:  CT of the neck and chest 04/14/2023. Abdominopelvic CT 04/19/2023. FINDINGS: Mediastinal blood pool activity: SUV max 2.5 NECK: No hypermetabolic cervical lymph nodes are identified. No suspicious activity identified within the pharyngeal mucosal space. Incidental CT findings: none CHEST: There is long segment hypermetabolic activity within the distal thoracic esophagus, extending approximately 7.3 cm in length and  demonstrating an SUV max of 8.9. Upstream esophageal dilatation with layering debris, as before. There are no enlarged or significant hypermetabolic mediastinal, hilar or axillary lymph nodes. 5 mm high right paratracheal node on image 43/6 demonstrates low-level metabolic activity (SUV max 2.3). No hypermetabolic pulmonary activity or suspicious nodularity. Incidental CT findings: Interval volume loss in the left lower lobe with paramediastinal opacity and low level metabolic activity, possibly reflecting atelectasis, although suspicious for aspiration. Mild aortic and great vessel atherosclerosis. ABDOMEN/PELVIS: There is no hypermetabolic activity within the liver, adrenal glands, spleen or pancreas. There is no hypermetabolic nodal activity in the abdomen or pelvis. Incidental CT findings: Interval percutaneous G-tube placement with low level activity surrounding the skin entrance site. Small gallstones. Stable cystic appearing lesion within the pancreatic head, measuring 1.9 cm on image 88/6, without hypermetabolic activity. Stable loculated left renal cysts measuring up to 8.3 cm on image 93/6, without hypermetabolic activity; no specific follow-up imaging recommended. Mild distal colonic diverticulosis without evidence of acute inflammation. Moderate prostatomegaly. SKELETON: There is no hypermetabolic activity to suggest osseous metastatic disease. Incidental CT findings: Mild spondylosis. Previously noted sclerotic lesions in the T2-L5 vertebral bodies demonstrate no associated hypermetabolic activity and are likely benign incidental findings. IMPRESSION: 1. Long segment hypermetabolic activity within the distal thoracic esophagus, consistent with known esophageal cancer. 2. No evidence of metastatic disease. A small high right paratracheal lymph node demonstrates low-level metabolic activity and is nonspecific, possibly reactive. 3. Interval volume loss in the left lower lobe with paramediastinal opacity  and low level metabolic activity, which could reflect atelectasis or aspiration. 4. Stable cystic appearing-lesion within the pancreatic head, without hypermetabolic activity, likely a side branch IPMN. Given the patient's history, suggest attention on follow-up imaging for the patient's esophageal cancer. 5. Interval percutaneous G-tube placement with low level activity surrounding the skin entrance site. 6.  Aortic Atherosclerosis (ICD10-I70.0). Electronically Signed   By: Carey Bullocks M.D.   On: 05/02/2023 17:37    Addendum I have seen the patient, examined him. I agree with the assessment and and plan and have edited the notes.   I reviewed his hospital admission course.  He has recovered well, he is tolerating tube feeds well.  Lab reviewed, adequate for treatment, will continue chemo and radiation.  Will follow-up closely.  We reviewed potential side effect and management, he knows what to watch and call us if needed.  I spent a total of 25 minutes for his visit today, more than 50% time on face-to-face counseling.  [name]  05/29/2023

## 2023-05-29 ENCOUNTER — Inpatient Hospital Stay: Payer: 59

## 2023-05-29 ENCOUNTER — Other Ambulatory Visit: Payer: Self-pay

## 2023-05-29 ENCOUNTER — Ambulatory Visit
Admission: RE | Admit: 2023-05-29 | Discharge: 2023-05-29 | Disposition: A | Discharge status: Home / Self Care | Payer: 59 | Source: Ambulatory Visit | Attending: Radiation Oncology | Admitting: Radiation Oncology

## 2023-05-29 ENCOUNTER — Inpatient Hospital Stay (HOSPITAL_BASED_OUTPATIENT_CLINIC_OR_DEPARTMENT_OTHER): Payer: 59 | Admitting: Nurse Practitioner

## 2023-05-29 ENCOUNTER — Encounter: Payer: 59 | Admitting: Nutrition

## 2023-05-29 ENCOUNTER — Encounter: Payer: Self-pay | Admitting: Nurse Practitioner

## 2023-05-29 VITALS — BP 118/72 | HR 102 | Temp 98.0°F | Resp 17

## 2023-05-29 VITALS — BP 117/81 | HR 87 | Wt 170.2 lb

## 2023-05-29 DIAGNOSIS — Z7901 Long term (current) use of anticoagulants: Secondary | ICD-10-CM | POA: Diagnosis not present

## 2023-05-29 DIAGNOSIS — E43 Unspecified severe protein-calorie malnutrition: Secondary | ICD-10-CM | POA: Diagnosis not present

## 2023-05-29 DIAGNOSIS — C159 Malignant neoplasm of esophagus, unspecified: Secondary | ICD-10-CM

## 2023-05-29 DIAGNOSIS — C155 Malignant neoplasm of lower third of esophagus: Secondary | ICD-10-CM | POA: Diagnosis not present

## 2023-05-29 DIAGNOSIS — Z510 Encounter for antineoplastic radiation therapy: Secondary | ICD-10-CM | POA: Diagnosis not present

## 2023-05-29 DIAGNOSIS — Z931 Gastrostomy status: Secondary | ICD-10-CM | POA: Diagnosis not present

## 2023-05-29 DIAGNOSIS — Z5111 Encounter for antineoplastic chemotherapy: Secondary | ICD-10-CM | POA: Diagnosis not present

## 2023-05-29 LAB — CMP (CANCER CENTER ONLY)
ALT: 14 U/L (ref 0–44)
AST: 16 U/L (ref 15–41)
Albumin: 3.3 g/dL — ABNORMAL LOW (ref 3.5–5.0)
Alkaline Phosphatase: 71 U/L (ref 38–126)
Anion gap: 6 (ref 5–15)
BUN: 29 mg/dL — ABNORMAL HIGH (ref 8–23)
CO2: 25 mmol/L (ref 22–32)
Calcium: 8.7 mg/dL — ABNORMAL LOW (ref 8.9–10.3)
Chloride: 106 mmol/L (ref 98–111)
Creatinine: 0.59 mg/dL — ABNORMAL LOW (ref 0.61–1.24)
GFR, Estimated: >60 mL/min (ref >60)
Glucose, Bld: 127 mg/dL — ABNORMAL HIGH (ref 70–99)
Potassium: 4.3 mmol/L (ref 3.5–5.1)
Sodium: 137 mmol/L (ref 135–145)
Total Bilirubin: 0.6 mg/dL (ref <1.2)
Total Protein: 6.2 g/dL — ABNORMAL LOW (ref 6.5–8.1)

## 2023-05-29 LAB — CBC WITH DIFFERENTIAL (CANCER CENTER ONLY)
Abs Immature Granulocytes: 0.15 10*3/uL — ABNORMAL HIGH (ref 0.00–0.07)
Basophils Absolute: 0 10*3/uL (ref 0.0–0.1)
Basophils Relative: 1 %
Eosinophils Absolute: 0.1 10*3/uL (ref 0.0–0.5)
Eosinophils Relative: 1 %
HCT: 43.8 % (ref 39.0–52.0)
Hemoglobin: 14.2 g/dL (ref 13.0–17.0)
Immature Granulocytes: 2 %
Lymphocytes Relative: 12 %
Lymphs Abs: 1 10*3/uL (ref 0.7–4.0)
MCH: 29.2 pg (ref 26.0–34.0)
MCHC: 32.4 g/dL (ref 30.0–36.0)
MCV: 90.1 fL (ref 80.0–100.0)
Monocytes Absolute: 0.7 10*3/uL (ref 0.1–1.0)
Monocytes Relative: 8 %
Neutro Abs: 6.4 10*3/uL (ref 1.7–7.7)
Neutrophils Relative %: 76 %
Platelet Count: 214 10*3/uL (ref 150–400)
RBC: 4.86 MIL/uL (ref 4.22–5.81)
RDW: 14.8 % (ref 11.5–15.5)
WBC Count: 8.4 10*3/uL (ref 4.0–10.5)
nRBC: 0 % (ref 0.0–0.2)

## 2023-05-29 LAB — RAD ONC ARIA SESSION SUMMARY
Course Elapsed Days: 7
Plan Fractions Treated to Date: 6
Plan Prescribed Dose Per Fraction: 1.8 Gy
Plan Total Fractions Prescribed: 25
Plan Total Prescribed Dose: 45 Gy
Reference Point Dosage Given to Date: 10.8 Gy
Reference Point Session Dosage Given: 1.8 Gy
Session Number: 6

## 2023-05-29 MED ORDER — CETIRIZINE HCL 10 MG/ML IV SOLN
10.0000 mg | Freq: Once | INTRAVENOUS | Status: AC
  Administered 2023-05-29: 10 mg via INTRAVENOUS
  Filled 2023-05-29: qty 1

## 2023-05-29 MED ORDER — PALONOSETRON HCL INJECTION 0.25 MG/5ML
0.2500 mg | Freq: Once | INTRAVENOUS | Status: AC
  Administered 2023-05-29: 0.25 mg via INTRAVENOUS
  Filled 2023-05-29: qty 5

## 2023-05-29 MED ORDER — SODIUM CHLORIDE 0.9 % IV SOLN
50.0000 mg/m2 | Freq: Once | INTRAVENOUS | Status: AC
  Administered 2023-05-29: 96 mg via INTRAVENOUS
  Filled 2023-05-29: qty 16

## 2023-05-29 MED ORDER — DEXAMETHASONE SODIUM PHOSPHATE 10 MG/ML IJ SOLN
10.0000 mg | Freq: Once | INTRAMUSCULAR | Status: AC
  Administered 2023-05-29: 10 mg via INTRAVENOUS
  Filled 2023-05-29: qty 1

## 2023-05-29 MED ORDER — SODIUM CHLORIDE 0.9 % IV SOLN: INTRAVENOUS | Status: DC

## 2023-05-29 MED ORDER — PROCHLORPERAZINE MALEATE 10 MG PO TABS: 10.0000 mg | ORAL_TABLET | Freq: Four times a day (QID) | ORAL | Status: DC | PRN

## 2023-05-29 MED ORDER — SODIUM CHLORIDE 0.9 % IV SOLN
184.2000 mg | Freq: Once | INTRAVENOUS | Status: AC
  Administered 2023-05-29: 180 mg via INTRAVENOUS
  Filled 2023-05-29: qty 18

## 2023-05-29 MED ORDER — ONDANSETRON HCL 8 MG PO TABS: 8.0000 mg | ORAL_TABLET | Freq: Three times a day (TID) | ORAL | Status: DC | PRN

## 2023-05-29 MED ORDER — DEXAMETHASONE 4 MG PO TABS: ORAL_TABLET | ORAL | Status: DC

## 2023-05-29 MED ORDER — FAMOTIDINE IN NACL 20-0.9 MG/50ML-% IV SOLN
20.0000 mg | Freq: Once | INTRAVENOUS | Status: AC
  Administered 2023-05-29: 20 mg via INTRAVENOUS
  Filled 2023-05-29: qty 50

## 2023-05-29 NOTE — Patient Instructions (Signed)
[facility] - A [facility]PT OF [facility]  Discharge Instructions: Thank you for choosing [facility] to provide your oncology and hematology care.   If you have a lab appointment with the Cancer Center, please go directly to the Cancer Center and check in at the registration area.   Wear comfortable clothing and clothing appropriate for easy access to any Portacath or PICC line.   We strive to give you quality time with your provider. You may need to reschedule your appointment if you arrive late (15 or more minutes).  Arriving late affects you and other patients whose appointments are after yours.  Also, if you miss three or more appointments without notifying the office, you may be dismissed from the clinic at the provider's discretion.      For prescription refill requests, have your pharmacy contact our office and allow 72 hours for refills to be completed.    Today you received the following chemotherapy and/or immunotherapy agents: Paclitaxel, Carboplatin.       To help prevent nausea and vomiting after your treatment, we encourage you to take your nausea medication as directed.  BELOW ARE SYMPTOMS THAT SHOULD BE REPORTED IMMEDIATELY: *FEVER GREATER THAN 100.4 F (38 C) OR HIGHER *CHILLS OR SWEATING *NAUSEA AND VOMITING THAT IS NOT CONTROLLED WITH YOUR NAUSEA MEDICATION *UNUSUAL SHORTNESS OF BREATH *UNUSUAL BRUISING OR BLEEDING *URINARY PROBLEMS (pain or burning when urinating, or frequent urination) *BOWEL PROBLEMS (unusual diarrhea, constipation, pain near the anus) TENDERNESS IN MOUTH AND THROAT WITH OR WITHOUT PRESENCE OF ULCERS (sore throat, sores in mouth, or a toothache) UNUSUAL RASH, SWELLING OR PAIN  UNUSUAL VAGINAL DISCHARGE OR ITCHING   Items with * indicate a potential emergency and should be followed up as soon as possible or go to the Emergency Department if any problems should occur.  Please show the CHEMOTHERAPY ALERT CARD  or IMMUNOTHERAPY ALERT CARD at check-in to the Emergency Department and triage nurse.  Should you have questions after your visit or need to cancel or reschedule your appointment, please contact [facility] - A DEPT OF [facility]  Dept: [phone]  and follow the prompts.  Office hours are 8:00 a.m. to 4:30 p.m. Monday - Friday. Please note that voicemails left after 4:00 p.m. may not be returned until the following business day.  We are closed weekends and major holidays. You have access to a nurse at all times for urgent questions. Please call the main number to the clinic Dept: [phone] and follow the prompts.   For any non-urgent questions, you may also contact your provider using MyChart. We now offer e-Visits for anyone 41 and older to request care online for non-urgent symptoms. For details visit [email or web address].   Also download the MyChart app! Go to the app store, search "MyChart", open the app, select [place], and log in with your MyChart username and password.

## 2023-05-29 NOTE — Progress Notes (Signed)
Nutrition Follow-up:  Patient with esophageal cancer.  Followed by Dr Mosetta Putt and Mitzi Hansen.  He is receiving concurrent chemotherapy and radiation with carboplatin and taxol weekly.   Met with patient during infusion.  Recent hospitalization for weakness.  Patient reports that he is using 6 cartons of osmolite 1.5 daily via tube along with using 30ml of prosource TID daily.  He is using ~2 liters of water through tube daily with flush and medication.  Gives 3 cartons of formula in the am and 3 cartons in the evening.  Denies trouble tolerating feeding.      Medications: reviewed  Labs: reviewed  Anthropometrics:   Weight 170 lb 4 oz today  171 lb on 11/5.    UBW of 200 lb    Estimated Energy Needs  Kcals: 2100-2300 Protein: 110-125 g Fluid: > 2.2 L  NUTRITION DIAGNOSIS: Severe malnutrition continues   INTERVENTION:  Continue current tube feeding regimen of 6 cartons of osmolite 1.5.  30ml of Prosource TID via feeding tube (purchased via [employer]).  Spoke with [employer] representative Jeri Modena as patient wanting syringe that he was given at the hospital.  Pam will follow-up with patient.      MONITORING, EVALUATION, GOAL: weight trends, tube feeding tolerance   NEXT VISIT: Monday, Dec 2 during infusion with Quentin Angst B. Freida Busman, RD, LDN Registered Dietitian [phone]

## 2023-05-30 ENCOUNTER — Other Ambulatory Visit: Payer: Self-pay

## 2023-05-30 ENCOUNTER — Telehealth: Payer: Self-pay

## 2023-05-30 ENCOUNTER — Ambulatory Visit
Admission: RE | Admit: 2023-05-30 | Discharge: 2023-05-30 | Disposition: A | Discharge status: Home / Self Care | Payer: 59 | Source: Ambulatory Visit | Attending: Radiation Oncology | Admitting: Radiation Oncology

## 2023-05-30 ENCOUNTER — Inpatient Hospital Stay: Payer: 59

## 2023-05-30 ENCOUNTER — Encounter: Payer: Self-pay | Admitting: Hematology

## 2023-05-30 DIAGNOSIS — E8809 Other disorders of plasma-protein metabolism, not elsewhere classified: Secondary | ICD-10-CM | POA: Diagnosis not present

## 2023-05-30 DIAGNOSIS — Z7901 Long term (current) use of anticoagulants: Secondary | ICD-10-CM | POA: Diagnosis not present

## 2023-05-30 DIAGNOSIS — E43 Unspecified severe protein-calorie malnutrition: Secondary | ICD-10-CM | POA: Diagnosis not present

## 2023-05-30 DIAGNOSIS — Z87891 Personal history of nicotine dependence: Secondary | ICD-10-CM | POA: Diagnosis not present

## 2023-05-30 DIAGNOSIS — J9811 Atelectasis: Secondary | ICD-10-CM | POA: Diagnosis not present

## 2023-05-30 DIAGNOSIS — I2699 Other pulmonary embolism without acute cor pulmonale: Secondary | ICD-10-CM | POA: Diagnosis not present

## 2023-05-30 DIAGNOSIS — Z96653 Presence of artificial knee joint, bilateral: Secondary | ICD-10-CM | POA: Diagnosis not present

## 2023-05-30 DIAGNOSIS — Z5111 Encounter for antineoplastic chemotherapy: Secondary | ICD-10-CM | POA: Diagnosis not present

## 2023-05-30 DIAGNOSIS — I700 Atherosclerosis of aorta: Secondary | ICD-10-CM | POA: Diagnosis not present

## 2023-05-30 DIAGNOSIS — Z510 Encounter for antineoplastic radiation therapy: Secondary | ICD-10-CM | POA: Diagnosis not present

## 2023-05-30 DIAGNOSIS — K8020 Calculus of gallbladder without cholecystitis without obstruction: Secondary | ICD-10-CM | POA: Diagnosis not present

## 2023-05-30 DIAGNOSIS — K5730 Diverticulosis of large intestine without perforation or abscess without bleeding: Secondary | ICD-10-CM | POA: Diagnosis not present

## 2023-05-30 DIAGNOSIS — Z85820 Personal history of malignant melanoma of skin: Secondary | ICD-10-CM | POA: Diagnosis not present

## 2023-05-30 DIAGNOSIS — I5032 Chronic diastolic (congestive) heart failure: Secondary | ICD-10-CM | POA: Diagnosis not present

## 2023-05-30 DIAGNOSIS — E860 Dehydration: Secondary | ICD-10-CM | POA: Diagnosis not present

## 2023-05-30 DIAGNOSIS — Z931 Gastrostomy status: Secondary | ICD-10-CM | POA: Diagnosis not present

## 2023-05-30 DIAGNOSIS — E119 Type 2 diabetes mellitus without complications: Secondary | ICD-10-CM | POA: Diagnosis not present

## 2023-05-30 DIAGNOSIS — Z9181 History of falling: Secondary | ICD-10-CM | POA: Diagnosis not present

## 2023-05-30 DIAGNOSIS — C155 Malignant neoplasm of lower third of esophagus: Secondary | ICD-10-CM | POA: Diagnosis not present

## 2023-05-30 DIAGNOSIS — C159 Malignant neoplasm of esophagus, unspecified: Secondary | ICD-10-CM | POA: Diagnosis not present

## 2023-05-30 LAB — RAD ONC ARIA SESSION SUMMARY
Course Elapsed Days: 8
Plan Fractions Treated to Date: 7
Plan Prescribed Dose Per Fraction: 1.8 Gy
Plan Total Fractions Prescribed: 25
Plan Total Prescribed Dose: 45 Gy
Reference Point Dosage Given to Date: 12.6 Gy
Reference Point Session Dosage Given: 1.8 Gy
Session Number: 7

## 2023-05-30 NOTE — Telephone Encounter (Signed)
Copied from CRM [id number]. Topic: General - Other >> May 29, 2023  2:29 PM Epimenio Foot F wrote: Reason for CRM: Home Health Verbal Orders - Caller/Agency: [facility]  Callback Number: [phone] Service Requested: Physical Therapy Frequency: 2 week 2 , 1 week 2  Any new concerns about the patient? No

## 2023-05-30 NOTE — Telephone Encounter (Signed)
Verbal orders were given for patient.

## 2023-05-31 ENCOUNTER — Inpatient Hospital Stay: Payer: 59

## 2023-05-31 ENCOUNTER — Other Ambulatory Visit: Payer: Self-pay

## 2023-05-31 ENCOUNTER — Ambulatory Visit
Admission: RE | Admit: 2023-05-31 | Discharge: 2023-05-31 | Disposition: A | Discharge status: Home / Self Care | Payer: 59 | Source: Ambulatory Visit | Attending: Radiation Oncology

## 2023-05-31 DIAGNOSIS — C155 Malignant neoplasm of lower third of esophagus: Secondary | ICD-10-CM | POA: Diagnosis not present

## 2023-05-31 DIAGNOSIS — E43 Unspecified severe protein-calorie malnutrition: Secondary | ICD-10-CM | POA: Diagnosis not present

## 2023-05-31 DIAGNOSIS — Z7901 Long term (current) use of anticoagulants: Secondary | ICD-10-CM | POA: Diagnosis not present

## 2023-05-31 DIAGNOSIS — Z510 Encounter for antineoplastic radiation therapy: Secondary | ICD-10-CM | POA: Diagnosis not present

## 2023-05-31 DIAGNOSIS — Z5111 Encounter for antineoplastic chemotherapy: Secondary | ICD-10-CM | POA: Diagnosis not present

## 2023-05-31 DIAGNOSIS — Z931 Gastrostomy status: Secondary | ICD-10-CM | POA: Diagnosis not present

## 2023-05-31 LAB — RAD ONC ARIA SESSION SUMMARY
Course Elapsed Days: 9
Plan Fractions Treated to Date: 8
Plan Prescribed Dose Per Fraction: 1.8 Gy
Plan Total Fractions Prescribed: 25
Plan Total Prescribed Dose: 45 Gy
Reference Point Dosage Given to Date: 14.4 Gy
Reference Point Session Dosage Given: 1.8 Gy
Session Number: 8

## 2023-06-02 ENCOUNTER — Other Ambulatory Visit: Payer: Self-pay

## 2023-06-03 NOTE — Assessment & Plan Note (Signed)
-  cTxN0M0 -Patient presented to hospital with progressive dysphagia and weight loss. -EGD showed a malignant appearing severe stenosis 30 to 34 cm from incisors.  The stenosis measured 4 cm, the scope was not able to pass.  Biopsy confirmed adenocarcinoma -He underwent PEG feeding tube for nutrition -PET scan showed hypermetabolic lesion in the distal esophagus, no nodal or distant metastasis.   -pt was evaluated by cardiothoracic surgeon Dr. Cliffton Asters, he is not a good candidate for surgery, but will re-evaluate him after chemoradiation -He started concurrent chemoradiation with weekly carbo platinum and Taxol on 05/22/23

## 2023-06-05 ENCOUNTER — Inpatient Hospital Stay: Payer: 59

## 2023-06-05 ENCOUNTER — Ambulatory Visit
Admission: RE | Admit: 2023-06-05 | Discharge: 2023-06-05 | Disposition: A | Discharge status: Home / Self Care | Payer: 59 | Source: Ambulatory Visit | Attending: Radiation Oncology | Admitting: Radiation Oncology

## 2023-06-05 ENCOUNTER — Encounter: Payer: Self-pay | Admitting: Hematology

## 2023-06-05 ENCOUNTER — Other Ambulatory Visit: Payer: Self-pay

## 2023-06-05 ENCOUNTER — Inpatient Hospital Stay: Payer: 59 | Admitting: Nutrition

## 2023-06-05 ENCOUNTER — Inpatient Hospital Stay (HOSPITAL_BASED_OUTPATIENT_CLINIC_OR_DEPARTMENT_OTHER): Payer: 59 | Admitting: Hematology

## 2023-06-05 ENCOUNTER — Ambulatory Visit: Payer: 59

## 2023-06-05 VITALS — BP 98/70 | HR 58 | Temp 97.8°F | Resp 17 | Wt 169.8 lb

## 2023-06-05 DIAGNOSIS — Z510 Encounter for antineoplastic radiation therapy: Secondary | ICD-10-CM | POA: Diagnosis not present

## 2023-06-05 DIAGNOSIS — E43 Unspecified severe protein-calorie malnutrition: Secondary | ICD-10-CM | POA: Insufficient documentation

## 2023-06-05 DIAGNOSIS — C159 Malignant neoplasm of esophagus, unspecified: Secondary | ICD-10-CM

## 2023-06-05 DIAGNOSIS — Z931 Gastrostomy status: Secondary | ICD-10-CM | POA: Insufficient documentation

## 2023-06-05 DIAGNOSIS — C155 Malignant neoplasm of lower third of esophagus: Secondary | ICD-10-CM | POA: Diagnosis not present

## 2023-06-05 DIAGNOSIS — R682 Dry mouth, unspecified: Secondary | ICD-10-CM | POA: Insufficient documentation

## 2023-06-05 DIAGNOSIS — Z5111 Encounter for antineoplastic chemotherapy: Secondary | ICD-10-CM | POA: Insufficient documentation

## 2023-06-05 DIAGNOSIS — E46 Unspecified protein-calorie malnutrition: Secondary | ICD-10-CM | POA: Insufficient documentation

## 2023-06-05 DIAGNOSIS — Z7901 Long term (current) use of anticoagulants: Secondary | ICD-10-CM | POA: Insufficient documentation

## 2023-06-05 DIAGNOSIS — R0609 Other forms of dyspnea: Secondary | ICD-10-CM | POA: Insufficient documentation

## 2023-06-05 LAB — CMP (CANCER CENTER ONLY)
ALT: 17 U/L (ref 0–44)
AST: 15 U/L (ref 15–41)
Albumin: 3.2 g/dL — ABNORMAL LOW (ref 3.5–5.0)
Alkaline Phosphatase: 60 U/L (ref 38–126)
Anion gap: 7 (ref 5–15)
BUN: 24 mg/dL — ABNORMAL HIGH (ref 8–23)
CO2: 26 mmol/L (ref 22–32)
Calcium: 8.5 mg/dL — ABNORMAL LOW (ref 8.9–10.3)
Chloride: 105 mmol/L (ref 98–111)
Creatinine: 0.62 mg/dL (ref 0.61–1.24)
GFR, Estimated: >60 mL/min (ref >60)
Glucose, Bld: 178 mg/dL — ABNORMAL HIGH (ref 70–99)
Potassium: 4 mmol/L (ref 3.5–5.1)
Sodium: 138 mmol/L (ref 135–145)
Total Bilirubin: 0.8 mg/dL (ref <1.2)
Total Protein: 5.6 g/dL — ABNORMAL LOW (ref 6.5–8.1)

## 2023-06-05 LAB — CBC WITH DIFFERENTIAL (CANCER CENTER ONLY)
Abs Immature Granulocytes: 0.05 10*3/uL (ref 0.00–0.07)
Basophils Absolute: 0 10*3/uL (ref 0.0–0.1)
Basophils Relative: 1 %
Eosinophils Absolute: 0 10*3/uL (ref 0.0–0.5)
Eosinophils Relative: 1 %
HCT: 43 % (ref 39.0–52.0)
Hemoglobin: 14.3 g/dL (ref 13.0–17.0)
Immature Granulocytes: 2 %
Lymphocytes Relative: 13 %
Lymphs Abs: 0.4 10*3/uL — ABNORMAL LOW (ref 0.7–4.0)
MCH: 30 pg (ref 26.0–34.0)
MCHC: 33.3 g/dL (ref 30.0–36.0)
MCV: 90.1 fL (ref 80.0–100.0)
Monocytes Absolute: 0.4 10*3/uL (ref 0.1–1.0)
Monocytes Relative: 14 %
Neutro Abs: 2.2 10*3/uL (ref 1.7–7.7)
Neutrophils Relative %: 69 %
Platelet Count: 196 10*3/uL (ref 150–400)
RBC: 4.77 MIL/uL (ref 4.22–5.81)
RDW: 15.3 % (ref 11.5–15.5)
WBC Count: 3.1 10*3/uL — ABNORMAL LOW (ref 4.0–10.5)
nRBC: 1 % — ABNORMAL HIGH (ref 0.0–0.2)

## 2023-06-05 LAB — RAD ONC ARIA SESSION SUMMARY
Course Elapsed Days: 14
Plan Fractions Treated to Date: 9
Plan Prescribed Dose Per Fraction: 1.8 Gy
Plan Total Fractions Prescribed: 25
Plan Total Prescribed Dose: 45 Gy
Reference Point Dosage Given to Date: 16.2 Gy
Reference Point Session Dosage Given: 1.8 Gy
Session Number: 9

## 2023-06-05 MED ORDER — PALONOSETRON HCL INJECTION 0.25 MG/5ML
0.2500 mg | Freq: Once | INTRAVENOUS | Status: AC
  Administered 2023-06-05: 0.25 mg via INTRAVENOUS
  Filled 2023-06-05: qty 5

## 2023-06-05 MED ORDER — SODIUM CHLORIDE 0.9 % IV SOLN: INTRAVENOUS | Status: DC

## 2023-06-05 MED ORDER — SODIUM CHLORIDE 0.9 % IV SOLN
50.0000 mg/m2 | Freq: Once | INTRAVENOUS | Status: AC
  Administered 2023-06-05: 96 mg via INTRAVENOUS
  Filled 2023-06-05: qty 16

## 2023-06-05 MED ORDER — DEXAMETHASONE SODIUM PHOSPHATE 10 MG/ML IJ SOLN
10.0000 mg | Freq: Once | INTRAMUSCULAR | Status: AC
  Administered 2023-06-05: 10 mg via INTRAVENOUS
  Filled 2023-06-05: qty 1

## 2023-06-05 MED ORDER — CETIRIZINE HCL 10 MG/ML IV SOLN
10.0000 mg | Freq: Once | INTRAVENOUS | Status: AC
  Administered 2023-06-05: 10 mg via INTRAVENOUS
  Filled 2023-06-05: qty 1

## 2023-06-05 MED ORDER — SODIUM CHLORIDE 0.9 % IV SOLN
184.2000 mg | Freq: Once | INTRAVENOUS | Status: AC
  Administered 2023-06-05: 180 mg via INTRAVENOUS
  Filled 2023-06-05: qty 18

## 2023-06-05 MED ORDER — FAMOTIDINE IN NACL 20-0.9 MG/50ML-% IV SOLN
20.0000 mg | Freq: Once | INTRAVENOUS | Status: AC
  Administered 2023-06-05: 20 mg via INTRAVENOUS
  Filled 2023-06-05: qty 50

## 2023-06-05 MED ORDER — ONDANSETRON HCL 4 MG PO TABS: 4.0000 mg | ORAL_TABLET | Freq: Three times a day (TID) | ORAL | 2 refills | Status: DC | PRN

## 2023-06-05 NOTE — Patient Instructions (Signed)
[facility]  Discharge Instructions: Thank you for choosing [facility] to provide your oncology and hematology care.   If you have a lab appointment with the Cancer Center, please go directly to the Cancer Center and check in at the registration area.   Wear comfortable clothing and clothing appropriate for easy access to any Portacath or PICC line.   We strive to give you quality time with your provider. You may need to reschedule your appointment if you arrive late (15 or more minutes).  Arriving late affects you and other patients whose appointments are after yours.  Also, if you miss three or more appointments without notifying the office, you may be dismissed from the clinic at the provider's discretion.      For prescription refill requests, have your pharmacy contact our office and allow 72 hours for refills to be completed.    Today you received the following chemotherapy and/or immunotherapy agents Taxol / Carboplatin      To help prevent nausea and vomiting after your treatment, we encourage you to take your nausea medication as directed.  BELOW ARE SYMPTOMS THAT SHOULD BE REPORTED IMMEDIATELY: *FEVER GREATER THAN 100.4 F (38 C) OR HIGHER *CHILLS OR SWEATING *NAUSEA AND VOMITING THAT IS NOT CONTROLLED WITH YOUR NAUSEA MEDICATION *UNUSUAL SHORTNESS OF BREATH *UNUSUAL BRUISING OR BLEEDING *URINARY PROBLEMS (pain or burning when urinating, or frequent urination) *BOWEL PROBLEMS (unusual diarrhea, constipation, pain near the anus) TENDERNESS IN MOUTH AND THROAT WITH OR WITHOUT PRESENCE OF ULCERS (sore throat, sores in mouth, or a toothache) UNUSUAL RASH, SWELLING OR PAIN  UNUSUAL VAGINAL DISCHARGE OR ITCHING   Items with * indicate a potential emergency and should be followed up as soon as possible or go to the Emergency Department if any problems should occur.  Please show the CHEMOTHERAPY ALERT CARD or  IMMUNOTHERAPY ALERT CARD at check-in to the Emergency Department and triage nurse.  Should you have questions after your visit or need to cancel or reschedule your appointment, please contact [facility] - A [facility]  Dept: [phone]  and follow the prompts.  Office hours are 8:00 a.m. to 4:30 p.m. Monday - Friday. Please note that voicemails left after 4:00 p.m. may not be returned until the following business day.  We are closed weekends and major holidays. You have access to a nurse at all times for urgent questions. Please call the main number to the clinic Dept: [phone] and follow the prompts.   For any non-urgent questions, you may also contact your provider using MyChart. We now offer e-Visits for anyone 33 and older to request care online for non-urgent symptoms. For details visit [email or web address].   Also download the MyChart app! Go to the app store, search "MyChart", open the app, select [place], and log in with your MyChart username and password.

## 2023-06-05 NOTE — Patient Outreach (Signed)
Care Management  Transitions of Care Program Transitions of Care Post-discharge Final tele-visit, week 5   06/02/2023 Name: Samuel [name] MRN: [id number] DOB: 10-19-1945  Subjective: Samuel [name] is a 77 y.o. year old male who is a primary care patient of [name], MD. The Care Management team Engaged with patient Engaged with patient by telephone to assess and address transitions of care needs.   Consent to Services:  Patient was given information about care management services, agreed to services, and gave verbal consent to participate.   Assessment:           SDOH Interventions    Flowsheet Row Patient Outreach from 06/02/2023 in [facility] to Hosp-Admission (Discharged) from 05/22/2023 in [facility] Clinical Support from 05/08/2023 in [facility] 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   RNCM Clinical Goal(s):  Patient will work with the Care Management team over the next 30 days to address Transition of Care Barriers: Need for Oncology follow up on malignant-appearing esophageal stenosis/biopsy results with Dr. Mosetta Putt on 04/24/23; Adjusting to NPO status and need for Osmolite 1.5 tube feedings to address protein  calorie malnutrition  through collaboration with RN Care manager, provider, and care team. 11/1 Update - Patient has had appt w/ Dr. Mosetta Putt and will be pursuing chemo/radiation as soon as possible, as well as planning for Esophagectomy once he is strong enough for surgery. Patient has an excellent mindset, positive attitude, and is pro-active in his personal healthcare management. In order to undergo an esophagectomy, however, patient needs to increase his protein intake(noted for having severe protein cal malnutrition) and increase his overall physical stamina/conditioning from current deconditioning status d/t recent hospitlaization before undergoing surgery to remove part or all of the esophagus and then having the "esophagus" rebuilt from part of the stomach with the end result = removal of cancerous tissue and ability to eventually eat solid food again. 11/15 Update - patient starting chemotherapy and radiation treatment twice weekly at the [facility] starting Monday, 11/18. Received education and tours from "[place]" of Oncology and assigned an Oncology Nurse Navigator named [place]. Oriented to Infusion center & staff as well as Radiation department staff. Has appointment with Surgeon, Dr. Brynda Greathouse at 12:30 today, 11/15 to discuss probable esophagectomy following chemotherapy/radiation treatment. 11/22 Update - undergoing chemotherapy and radiation at [facility], was briefly hospitalized (observation status) on Monday, 11/18 through Wednesday 11/20 due to esophageal pain and spasms post first chemotherapy and radiation treatment, generalized weakness, and inability to take in any nutrition (NPO and was unable to administer tube feeding due to pain and spasming. Patient has also decided not to pursue 9-hour, complicated and dangerous esophagectomy after speaking with Dr. Cliffton Asters who recommended not pursuing  due to high risk for death 11/29 Update - Patient is tolerating chemotherapy and radiation  therapy better than he expected, and is approximately 1/3rd done with planned treatment.   Interventions: Evaluation of current treatment plan related to  self management and patient's adherence to plan as established by provider   Oncology:  (Status: ONGOING) Short Term Goal Assessment of understanding of oncology diagnosis: 11/1 patient is highly intelligent and has an excellent understanding of his oncology diagnosis Assessed patient understanding of cancer diagnosis and recommended treatment plan Reviewed upcoming provider appointments and treatment appointments Assessed available transportation to appointments and treatments. Has consistent/reliable transportation: Yes Assessed support system. Has consistent/reliable family or other support: Yes PHQ2/PHQ9 performed Nutrition assessment performed  Patient Goals/Self-Care Activities: Participate in Transition of Care Program/Attend TOC scheduled calls Take all medications as prescribed Call pharmacy for medication refills 3-7 days in advance of running out of medications Call provider office for new concerns or questions   Follow Up Plan:  The patient has been provided with contact information for the care management team and has been advised to call with any health related questions or concerns.          Plan: The patient has been provided with contact information for the care management team and has been advised to call with any health related questions or concerns.   [name], RN, BA, [facility], CRRN [facility] Coordinator, Transition of Care Ph # [phone]

## 2023-06-05 NOTE — Progress Notes (Signed)
[facility]   Telephone:(336) [phone] Fax:(336) [phone]   Clinic Follow up Note   Patient Care Team: [name], MD as PCP - General (Family Medicine) [name], MD as PCP - Cardiology (Cardiology) [name], MD as Consulting Physician (Hematology and Oncology)  Date of Service:  06/05/2023  CHIEF COMPLAINT: f/u of esophageal cancer  CURRENT THERAPY:  Concurrent chemoradiation with weekly carboplatin and Taxol  Oncology History   Malignant neoplasm of esophagus (HCC) -cTxN0M0 -Patient presented to hospital with progressive dysphagia and weight loss. -EGD showed a malignant appearing severe stenosis 30 to 34 cm from incisors.  The stenosis measured 4 cm, the scope was not able to pass.  Biopsy confirmed adenocarcinoma -He underwent PEG feeding tube for nutrition -PET scan showed hypermetabolic lesion in the distal esophagus, no nodal or distant metastasis.   -pt was evaluated by cardiothoracic surgeon Dr. Cliffton Asters, he is not a good candidate for surgery, but will re-evaluate him after chemoradiation -He started concurrent chemoradiation with weekly carbo platinum and Taxol on 05/22/23   Assessment and Plan    Esophageal Cancer Follow-up for esophageal cancer. Reports general well-being but experiences breathlessness, especially in elevators. No fever or chills. Continues physical therapy with improvement. No numbness or tingling. Uses ice on extremities during infusion to reduce neuropathy. Blood tests are well-managed. Dexa administered as a bolus post-chemotherapy.  - Continue current chemotherapy regimen - Continue radiaiton therapy - Continue using ice during infusions to reduce neuropathy - Monitor blood tests - Administer Dexa as prescribed  dyspnea on exertion -Patient reports dyspnea on exertion and feels out of breath when he is in elevated - Checked oxygen levels during ambulation today and no hypoxia observed.  Medication  Adjustment Requests to change Zofran dosage from 8 mg to 4 mg due to difficulty cutting tablets. Prefers to grind and administer via bolus. Discussed benefits of dosage adjustment for ease of administration. - Change Zofran dosage to 4 mg - Send prescription to [facility] on [address]   Plan -Lab reviewed, adequate for treatment, will proceed chemotherapy today -Continue radiation -Continue tube feeds at home -No hypoxia during ambulation, I do not recommend home oxygen -Follow-up in 1 week    SUMMARY OF ONCOLOGIC HISTORY: Oncology History  Malignant neoplasm of esophagus (HCC)  04/18/2023 Cancer Staging   Staging form: Esophagus - Adenocarcinoma, AJCC 8th Edition - Clinical stage from 04/18/2023: Stage Unknown (cTX, cN0, cM0) - Signed by [name], MD on 05/04/2023 Total positive nodes: 0   04/20/2023 Initial Diagnosis   Malignant neoplasm of lower third of esophagus (HCC)   05/22/2023 -  Chemotherapy   Patient is on Treatment Plan : ESOPHAGUS Carboplatin + Paclitaxel Weekly X 6 Weeks with XRT        Discussed the use of AI scribe software for clinical note transcription with the patient, who gave verbal consent to proceed.  History of Present Illness   Samuel [name], a 77 year old gentleman with a history of esophageal cancer, presents for follow-up. He reports feeling generally well but has concerns about an EKG performed during his last infusion, which he perceived as irregular. Despite this, he denies palpitations and is not aware of any irregularities in his heart rhythm. 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 He is considering getting a portable oxygen concentrator for this reason.  In addition to his cardiac concerns, Samuel [name] reports feeling tired, particularly when walking, but is  actively working on this with physical therapy. He denies fever and chills. He is able to care for  himself at home and assists in caring for his twins with the help of his wife. He is managing his feeding via a stomach bolus and has recently acquired a new set of syringes that are easier to use. He denies any numbness or tingling.  Samuel [name] also mentions taking a steroid (Dexa) via bolus for two days after chemotherapy, which he believes is helping. He is also considering changing his Zofran dosage from 8mg  to 4mg .         All other systems were reviewed with the patient and are negative.  MEDICAL HISTORY:  Past Medical History:  Diagnosis Date   Diabetes mellitus without complication (HCC)    Esophageal cancer (HCC)    Pulmonary embolism (HCC)    In October 2024    SURGICAL HISTORY: Past Surgical History:  Procedure Laterality Date   BIOPSY  04/18/2023   Procedure: BIOPSY;  Surgeon: Lynann Bologna, DO;  Location: [facility] ENDOSCOPY;  Service: Gastroenterology;;   ESOPHAGOGASTRODUODENOSCOPY N/A 04/18/2023   Procedure: ESOPHAGOGASTRODUODENOSCOPY (EGD);  Surgeon: Lynann Bologna, DO;  Location: [facility] ENDOSCOPY;  Service: Gastroenterology;  Laterality: N/A;   IR GASTROSTOMY TUBE MOD SED  04/20/2023   IR NASO G TUBE PLC W/FL W/RAD  04/20/2023   KNEE ARTHROPLASTY Bilateral     I have reviewed the social history and family history with the patient and they are unchanged from previous note.  ALLERGIES:  is allergic to penicillins, sulfa antibiotics, and procaine.  MEDICATIONS:  Current Outpatient Medications  Medication Sig Dispense Refill   ondansetron (ZOFRAN) 4 MG tablet Take 1 tablet (4 mg total) by mouth every 8 (eight) hours as needed for nausea or vomiting. 20 tablet 2   apixaban (ELIQUIS) 5 MG TABS tablet Place 1 tablet (5 mg total) into feeding tube 2 (two) times daily. 60 tablet 2   Blood Glucose Monitoring Suppl (ACCU-CHEK GUIDE ME) w/Device KIT USE AS DIRECTED 3 TIMES A DAY 1 kit 0   dexamethasone (DECADRON) 4 MG tablet Take 2 tablets daily for 2 days, start the day  after chemotherapy. Take with food.     glucose blood (ACCU-CHEK GUIDE) test strip Use as instructed daily 100 each 12   prochlorperazine (COMPAZINE) 10 MG tablet Take 1 tablet (10 mg total) by mouth every 6 (six) hours as needed for nausea or vomiting. (Patient not taking: Reported on 06/02/2023)     Water For Irrigation, Sterile (FREE WATER) SOLN Place 120 mLs into feeding tube 6 (six) times daily. 24000 mL 0   No current facility-administered medications for this visit.    PHYSICAL EXAMINATION: ECOG PERFORMANCE STATUS: 2 - Symptomatic, <50% confined to bed  Vitals:   06/05/23 0955  BP: 98/70  Pulse: (!) 58  Resp: 17  Temp: 97.8 F (36.6 C)  SpO2: 96%   Wt Readings from Last 3 Encounters:  06/05/23 169 lb 12.8 oz (77 kg)  05/29/23 170 lb 4 oz (77.2 kg)  05/24/23 169 lb 3.2 oz (76.7 kg)     GENERAL:alert, no distress and comfortable SKIN: skin color, texture, turgor are normal, no rashes or significant lesions EYES: normal, Conjunctiva are pink and non-injected, sclera clear NECK: supple, thyroid normal size, non-tender, without nodularity LYMPH:  no palpable lymphadenopathy in the cervical, axillary  LUNGS: clear to auscultation and percussion with normal breathing effort HEART: regular rate & rhythm and no murmurs and no lower extremity  edema ABDOMEN:abdomen soft, non-tender and normal bowel sounds Musculoskeletal:no cyanosis of digits and no clubbing  NEURO: alert & oriented x 3 with fluent speech, no focal motor/sensory deficits    LABORATORY DATA:  I have reviewed the data as listed    Latest Ref Rng & Units 06/05/2023    9:23 AM 05/29/2023   10:06 AM 05/24/2023    5:35 AM  CBC  WBC 4.0 - 10.5 K/uL 3.1  8.4  8.0   Hemoglobin 13.0 - 17.0 g/dL 40.9  81.1  91.4   Hematocrit 39.0 - 52.0 % 43.0  43.8  47.3   Platelets 150 - 400 K/uL 196  214  212         Latest Ref Rng & Units 06/05/2023    9:23 AM 05/29/2023   10:06 AM 05/24/2023    5:35 AM  CMP  Glucose 70  - 99 mg/dL 782  956  213   BUN 8 - 23 mg/dL 24  29  20    Creatinine 0.61 - 1.24 mg/dL 0.86  5.78  4.69   Sodium 135 - 145 mmol/L 138  137  138   Potassium 3.5 - 5.1 mmol/L 4.0  4.3  4.0   Chloride 98 - 111 mmol/L 105  106  108   CO2 22 - 32 mmol/L 26  25  24    Calcium 8.9 - 10.3 mg/dL 8.5  8.7  8.4   Total Protein 6.5 - 8.1 g/dL 5.6  6.2  5.9   Total Bilirubin <1.2 mg/dL 0.8  0.6  0.9   Alkaline Phos 38 - 126 U/L 60  71  62   AST 15 - 41 U/L 15  16  23    ALT 0 - 44 U/L 17  14  17        RADIOGRAPHIC STUDIES: I have personally reviewed the radiological images as listed and agreed with the findings in the report. No results found.    No orders of the defined types were placed in this encounter.  All questions were answered. The patient knows to call the clinic with any problems, questions or concerns. No barriers to learning was detected. The total time spent in the appointment was 25 minutes.     [name], MD 06/05/2023

## 2023-06-05 NOTE — Patient Instructions (Signed)
Visit Information  Samuel [name],  It has been a pleasure and privilege assisting you with your transition of care from hospital to home. Please don't hesitate to contact me if I can be of assistance to you again.  Warm Regards,  [name]    Following is a copy of your care plan:   Goals Addressed             This Visit's Progress    COMPLETED: Transition of Care       Current Barriers:  Chronic Disease Management support and education needs related to Esophageal stenosis and rapid onset of dysphagia, initially solids transitioning to liquids, now unable to tolerate any po without severe pleuritic chest pain.G-tube placement on 10/17, NPO.   RNCM Clinical Goal(s):  Patient will work with the Care Management team over the next 30 days to address Transition of Care Barriers: Need for Oncology follow up on malignant-appearing esophageal stenosis/biopsy results with Dr. Mosetta Putt on 04/24/23; Adjusting to NPO status and need for Osmolite 1.5 tube feedings to address protein calorie malnutrition  through collaboration with RN Care manager, provider, and care team. 11/1 Update - Patient has had appt w/ Dr. Mosetta Putt and will be pursuing chemo/radiation as soon as possible, as well as planning for Esophagectomy once he is strong enough for surgery. Patient has an excellent mindset, positive attitude, and is pro-active in his personal healthcare management. In order to undergo an esophagectomy, however, patient needs to increase his protein intake(noted for having severe protein cal malnutrition) and increase his overall physical stamina/conditioning from current deconditioning status d/t recent hospitlaization before undergoing surgery to remove part or all of the esophagus and then having the "esophagus" rebuilt from part of the stomach with the end result = removal of cancerous tissue and ability to eventually eat solid food again. 11/15 Update - patient starting chemotherapy and radiation treatment twice  weekly at the [facility] starting Monday, 11/18. Received education and tours from "[place]" of Oncology and assigned an Oncology Nurse Navigator named [place]. Oriented to Infusion center & staff as well as Radiation department staff. Has appointment with Surgeon, Dr. Brynda Greathouse at 12:30 today, 11/15 to discuss probable esophagectomy following chemotherapy/radiation treatment. 11/22 Update - undergoing chemotherapy and radiation at [facility], was briefly hospitalized (observation status) on Monday, 11/18 through Wednesday 11/20 due to esophageal pain and spasms post first chemotherapy and radiation treatment, generalized weakness, and inability to take in any nutrition (NPO and was unable to administer tube feeding due to pain and spasming. Patient has also decided not to pursue 9-hour, complicated and dangerous esophagectomy after speaking with Dr. Cliffton Asters who recommended not pursuing due to high risk for death 06/09/2023 Update - Patient is tolerating chemotherapy and radiation therapy better than he expected, and is approximately 1/3rd done with planned treatment.   Interventions: Evaluation of current treatment plan related to  self management and patient's adherence to plan as established by provider   Oncology:  (Status: ONGOING) Short Term Goal Assessment of understanding of oncology diagnosis: 11/1 patient is highly intelligent and has an excellent understanding of his oncology diagnosis Assessed patient understanding of cancer diagnosis and recommended treatment plan Reviewed upcoming provider appointments and treatment appointments Assessed available transportation to appointments and treatments. Has consistent/reliable transportation: Yes Assessed support system. Has consistent/reliable family or other support: Yes PHQ2/PHQ9 performed Nutrition assessment performed  Patient Goals/Self-Care Activities: Participate in Transition of Care Program/Attend TOC scheduled calls Take all  medications as prescribed Call pharmacy for medication refills 3-7  days in advance of running out of medications Call provider office for new concerns or questions   Follow Up Plan:  The patient has been provided with contact information for the care management team and has been advised to call with any health related questions or concerns.          Patient verbalizes understanding of instructions and care plan provided today and agrees to view in MyChart. Active MyChart status and patient understanding of how to access instructions and care plan via MyChart confirmed with patient.     The patient has been provided with contact information for the care management team and has been advised to call with any health related questions or concerns.   Please call the care guide team at [phone] if you need to cancel or reschedule your appointment.   Please call 1-800-273-TALK (toll free, 24 hour hotline) if you are experiencing a Mental Health or Behavioral Health Crisis or need someone to talk to.  [name], RN, BA, [facility], CRRN [facility] Coordinator, Transition of Care Ph # [phone]

## 2023-06-05 NOTE — Progress Notes (Signed)
Nutrition follow-up completed with patient during infusion for esophageal cancer.  He receives concurrent chemoradiation therapy with carboplatin and Taxol.  Patient is followed by Dr. Mosetta Putt and Dr. Mitzi Hansen.  Weight is stable and documented as 169.8 pounds December 2.  Patient weighed 170 pounds 4 ounces November 25.  Noted labs: Glucose 178, BUN 24, creatinine 0.62, albumin 3.2.  Estimated nutrition needs: 2100-2300 cal, 110-125 g protein, greater than 2.2 L fluid.  Patient reports tolerating 3 cartons of Osmolite 1.5 twice daily via PEG.  Provide 30 mL Prosource 3 times a day via PEG.  He denies nausea, vomiting, constipation, and diarrhea.  He takes nausea medication as needed.  Reports soft semisolid stools.  States he has adequate supplies and has no questions regarding feeding tube or tube feeding regimen.  He is pleased with weight maintenance.  Tube feeding: 6 cartons Osmolite 1.5+30 mL Prosource TF 3 times daily plus free water flushes provides 2250 cal, 123 g protein.  Receives approximately 2 L of free water flushes in addition to free water via Osmolite 1.5.  Nutrition diagnosis: Severe malnutrition, ongoing.  Intervention: Educated to continue 6 cartons Osmolite 1.5 daily with 30 mL Prosource 3 times daily via PEG. Continue free water flushes to provide adequate hydration. Reviewed importance of flushing a minimum of 30 mL's free water after each medication via tube. Continue medications as needed to manage nausea or constipation.  Monitoring, evaluation, goals: Patient will tolerate adequate calories and protein to minimize weight loss.  Next visit: Monday, December 9 during infusion.

## 2023-06-06 ENCOUNTER — Other Ambulatory Visit: Payer: Self-pay

## 2023-06-06 ENCOUNTER — Ambulatory Visit
Admission: RE | Admit: 2023-06-06 | Discharge: 2023-06-06 | Disposition: A | Discharge status: Home / Self Care | Payer: 59 | Source: Ambulatory Visit | Attending: Radiation Oncology

## 2023-06-06 DIAGNOSIS — Z931 Gastrostomy status: Secondary | ICD-10-CM | POA: Diagnosis not present

## 2023-06-06 DIAGNOSIS — E860 Dehydration: Secondary | ICD-10-CM | POA: Diagnosis not present

## 2023-06-06 DIAGNOSIS — Z5111 Encounter for antineoplastic chemotherapy: Secondary | ICD-10-CM | POA: Diagnosis not present

## 2023-06-06 DIAGNOSIS — I700 Atherosclerosis of aorta: Secondary | ICD-10-CM | POA: Diagnosis not present

## 2023-06-06 DIAGNOSIS — J9811 Atelectasis: Secondary | ICD-10-CM | POA: Diagnosis not present

## 2023-06-06 DIAGNOSIS — I5032 Chronic diastolic (congestive) heart failure: Secondary | ICD-10-CM | POA: Diagnosis not present

## 2023-06-06 DIAGNOSIS — E43 Unspecified severe protein-calorie malnutrition: Secondary | ICD-10-CM | POA: Diagnosis not present

## 2023-06-06 DIAGNOSIS — C159 Malignant neoplasm of esophagus, unspecified: Secondary | ICD-10-CM | POA: Diagnosis not present

## 2023-06-06 DIAGNOSIS — Z87891 Personal history of nicotine dependence: Secondary | ICD-10-CM | POA: Diagnosis not present

## 2023-06-06 DIAGNOSIS — Z510 Encounter for antineoplastic radiation therapy: Secondary | ICD-10-CM | POA: Diagnosis not present

## 2023-06-06 DIAGNOSIS — Z7901 Long term (current) use of anticoagulants: Secondary | ICD-10-CM | POA: Diagnosis not present

## 2023-06-06 DIAGNOSIS — C155 Malignant neoplasm of lower third of esophagus: Secondary | ICD-10-CM | POA: Diagnosis not present

## 2023-06-06 DIAGNOSIS — K5730 Diverticulosis of large intestine without perforation or abscess without bleeding: Secondary | ICD-10-CM | POA: Diagnosis not present

## 2023-06-06 DIAGNOSIS — I2699 Other pulmonary embolism without acute cor pulmonale: Secondary | ICD-10-CM | POA: Diagnosis not present

## 2023-06-06 DIAGNOSIS — Z96653 Presence of artificial knee joint, bilateral: Secondary | ICD-10-CM | POA: Diagnosis not present

## 2023-06-06 DIAGNOSIS — Z85820 Personal history of malignant melanoma of skin: Secondary | ICD-10-CM | POA: Diagnosis not present

## 2023-06-06 DIAGNOSIS — K8020 Calculus of gallbladder without cholecystitis without obstruction: Secondary | ICD-10-CM | POA: Diagnosis not present

## 2023-06-06 DIAGNOSIS — E119 Type 2 diabetes mellitus without complications: Secondary | ICD-10-CM | POA: Diagnosis not present

## 2023-06-06 DIAGNOSIS — R1310 Dysphagia, unspecified: Secondary | ICD-10-CM | POA: Diagnosis not present

## 2023-06-06 DIAGNOSIS — E8809 Other disorders of plasma-protein metabolism, not elsewhere classified: Secondary | ICD-10-CM | POA: Diagnosis not present

## 2023-06-06 DIAGNOSIS — Z9181 History of falling: Secondary | ICD-10-CM | POA: Diagnosis not present

## 2023-06-06 LAB — RAD ONC ARIA SESSION SUMMARY
Course Elapsed Days: 15
Plan Fractions Treated to Date: 10
Plan Prescribed Dose Per Fraction: 1.8 Gy
Plan Total Fractions Prescribed: 25
Plan Total Prescribed Dose: 45 Gy
Reference Point Dosage Given to Date: 18 Gy
Reference Point Session Dosage Given: 1.8 Gy
Session Number: 10

## 2023-06-07 ENCOUNTER — Other Ambulatory Visit: Payer: Self-pay

## 2023-06-07 ENCOUNTER — Ambulatory Visit
Admission: RE | Admit: 2023-06-07 | Discharge: 2023-06-07 | Disposition: A | Discharge status: Home / Self Care | Payer: 59 | Source: Ambulatory Visit | Attending: Radiation Oncology | Admitting: Radiation Oncology

## 2023-06-07 ENCOUNTER — Inpatient Hospital Stay: Payer: 59

## 2023-06-07 DIAGNOSIS — E43 Unspecified severe protein-calorie malnutrition: Secondary | ICD-10-CM | POA: Diagnosis not present

## 2023-06-07 DIAGNOSIS — Z5111 Encounter for antineoplastic chemotherapy: Secondary | ICD-10-CM | POA: Diagnosis not present

## 2023-06-07 DIAGNOSIS — Z510 Encounter for antineoplastic radiation therapy: Secondary | ICD-10-CM | POA: Diagnosis not present

## 2023-06-07 DIAGNOSIS — Z931 Gastrostomy status: Secondary | ICD-10-CM | POA: Diagnosis not present

## 2023-06-07 DIAGNOSIS — C155 Malignant neoplasm of lower third of esophagus: Secondary | ICD-10-CM | POA: Diagnosis not present

## 2023-06-07 DIAGNOSIS — Z7901 Long term (current) use of anticoagulants: Secondary | ICD-10-CM | POA: Diagnosis not present

## 2023-06-07 LAB — RAD ONC ARIA SESSION SUMMARY
Course Elapsed Days: 16
Plan Fractions Treated to Date: 11
Plan Prescribed Dose Per Fraction: 1.8 Gy
Plan Total Fractions Prescribed: 25
Plan Total Prescribed Dose: 45 Gy
Reference Point Dosage Given to Date: 19.8 Gy
Reference Point Session Dosage Given: 1.8 Gy
Session Number: 11

## 2023-06-08 ENCOUNTER — Ambulatory Visit
Admission: RE | Admit: 2023-06-08 | Discharge: 2023-06-08 | Disposition: A | Discharge status: Home / Self Care | Payer: 59 | Source: Ambulatory Visit | Attending: Radiation Oncology | Admitting: Radiation Oncology

## 2023-06-08 ENCOUNTER — Inpatient Hospital Stay: Payer: 59

## 2023-06-08 ENCOUNTER — Other Ambulatory Visit: Payer: Self-pay

## 2023-06-08 ENCOUNTER — Ambulatory Visit: Payer: 59 | Admitting: Nutrition

## 2023-06-08 DIAGNOSIS — C155 Malignant neoplasm of lower third of esophagus: Secondary | ICD-10-CM | POA: Diagnosis not present

## 2023-06-08 DIAGNOSIS — Z5111 Encounter for antineoplastic chemotherapy: Secondary | ICD-10-CM | POA: Diagnosis not present

## 2023-06-08 DIAGNOSIS — E43 Unspecified severe protein-calorie malnutrition: Secondary | ICD-10-CM | POA: Diagnosis not present

## 2023-06-08 DIAGNOSIS — Z7901 Long term (current) use of anticoagulants: Secondary | ICD-10-CM | POA: Diagnosis not present

## 2023-06-08 DIAGNOSIS — Z510 Encounter for antineoplastic radiation therapy: Secondary | ICD-10-CM | POA: Diagnosis not present

## 2023-06-08 DIAGNOSIS — Z931 Gastrostomy status: Secondary | ICD-10-CM | POA: Diagnosis not present

## 2023-06-08 LAB — RAD ONC ARIA SESSION SUMMARY
Course Elapsed Days: 17
Plan Fractions Treated to Date: 12
Plan Prescribed Dose Per Fraction: 1.8 Gy
Plan Total Fractions Prescribed: 25
Plan Total Prescribed Dose: 45 Gy
Reference Point Dosage Given to Date: 21.6 Gy
Reference Point Session Dosage Given: 1.8 Gy
Session Number: 12

## 2023-06-08 NOTE — Progress Notes (Signed)
Patient left a message on inpatient RD phone that he has questions about nutrition. Contacted patient who was concerned about his last albumin level of 3.2. Patient wonders if he needs to increase protein intake to increase his albumin level. He receives 123 grams protein via TF and protein modular. (1.6 gm/kg) States he feels stronger now that he is working with PT. His weight has been stable.  Because albumin is not a reliable indicator of protein requirements and is influenced by the inflammatory process, I recommend he continue with present TF and protein modular. I have a follow up scheduled with him on Monday, Dec 9, during infusion.

## 2023-06-09 ENCOUNTER — Ambulatory Visit
Admission: RE | Admit: 2023-06-09 | Discharge: 2023-06-09 | Disposition: A | Discharge status: Home / Self Care | Payer: 59 | Source: Ambulatory Visit | Attending: Radiation Oncology | Admitting: Radiation Oncology

## 2023-06-09 ENCOUNTER — Inpatient Hospital Stay: Payer: 59

## 2023-06-09 ENCOUNTER — Ambulatory Visit
Admission: RE | Admit: 2023-06-09 | Discharge: 2023-06-09 | Disposition: A | Discharge status: Home / Self Care | Payer: 59 | Source: Ambulatory Visit | Attending: Radiation Oncology

## 2023-06-09 ENCOUNTER — Other Ambulatory Visit: Payer: Self-pay | Admitting: Family Medicine

## 2023-06-09 ENCOUNTER — Other Ambulatory Visit: Payer: Self-pay

## 2023-06-09 DIAGNOSIS — Z96653 Presence of artificial knee joint, bilateral: Secondary | ICD-10-CM | POA: Diagnosis not present

## 2023-06-09 DIAGNOSIS — Z5111 Encounter for antineoplastic chemotherapy: Secondary | ICD-10-CM | POA: Diagnosis not present

## 2023-06-09 DIAGNOSIS — C159 Malignant neoplasm of esophagus, unspecified: Secondary | ICD-10-CM | POA: Diagnosis not present

## 2023-06-09 DIAGNOSIS — K5730 Diverticulosis of large intestine without perforation or abscess without bleeding: Secondary | ICD-10-CM | POA: Diagnosis not present

## 2023-06-09 DIAGNOSIS — Z510 Encounter for antineoplastic radiation therapy: Secondary | ICD-10-CM | POA: Diagnosis not present

## 2023-06-09 DIAGNOSIS — E43 Unspecified severe protein-calorie malnutrition: Secondary | ICD-10-CM | POA: Diagnosis not present

## 2023-06-09 DIAGNOSIS — Z931 Gastrostomy status: Secondary | ICD-10-CM | POA: Diagnosis not present

## 2023-06-09 DIAGNOSIS — J9811 Atelectasis: Secondary | ICD-10-CM | POA: Diagnosis not present

## 2023-06-09 DIAGNOSIS — I5032 Chronic diastolic (congestive) heart failure: Secondary | ICD-10-CM | POA: Diagnosis not present

## 2023-06-09 DIAGNOSIS — K8020 Calculus of gallbladder without cholecystitis without obstruction: Secondary | ICD-10-CM | POA: Diagnosis not present

## 2023-06-09 DIAGNOSIS — E119 Type 2 diabetes mellitus without complications: Secondary | ICD-10-CM | POA: Diagnosis not present

## 2023-06-09 DIAGNOSIS — Z9181 History of falling: Secondary | ICD-10-CM | POA: Diagnosis not present

## 2023-06-09 DIAGNOSIS — Z85820 Personal history of malignant melanoma of skin: Secondary | ICD-10-CM | POA: Diagnosis not present

## 2023-06-09 DIAGNOSIS — E8809 Other disorders of plasma-protein metabolism, not elsewhere classified: Secondary | ICD-10-CM | POA: Diagnosis not present

## 2023-06-09 DIAGNOSIS — C155 Malignant neoplasm of lower third of esophagus: Secondary | ICD-10-CM | POA: Diagnosis not present

## 2023-06-09 DIAGNOSIS — I700 Atherosclerosis of aorta: Secondary | ICD-10-CM | POA: Diagnosis not present

## 2023-06-09 DIAGNOSIS — Z87891 Personal history of nicotine dependence: Secondary | ICD-10-CM | POA: Diagnosis not present

## 2023-06-09 DIAGNOSIS — I2699 Other pulmonary embolism without acute cor pulmonale: Secondary | ICD-10-CM | POA: Diagnosis not present

## 2023-06-09 DIAGNOSIS — E860 Dehydration: Secondary | ICD-10-CM | POA: Diagnosis not present

## 2023-06-09 DIAGNOSIS — Z7901 Long term (current) use of anticoagulants: Secondary | ICD-10-CM | POA: Diagnosis not present

## 2023-06-09 LAB — RAD ONC ARIA SESSION SUMMARY
Course Elapsed Days: 18
Plan Fractions Treated to Date: 13
Plan Prescribed Dose Per Fraction: 1.8 Gy
Plan Total Fractions Prescribed: 25
Plan Total Prescribed Dose: 45 Gy
Reference Point Dosage Given to Date: 23.4 Gy
Reference Point Session Dosage Given: 1.8 Gy
Session Number: 13

## 2023-06-11 NOTE — Assessment & Plan Note (Signed)
-  cTxN0M0 -Patient presented to hospital with progressive dysphagia and weight loss. -EGD showed a malignant appearing severe stenosis 30 to 34 cm from incisors.  The stenosis measured 4 cm, the scope was not able to pass.  Biopsy confirmed adenocarcinoma -He underwent PEG feeding tube for nutrition -PET scan showed hypermetabolic lesion in the distal esophagus, no nodal or distant metastasis.   -pt was evaluated by cardiothoracic surgeon Dr. Cliffton Asters, he is not a good candidate for surgery, but will re-evaluate him after chemoradiation -He started concurrent chemoradiation with weekly carbo platinum and Taxol on 05/22/23, he is tolerating moderately well overall

## 2023-06-12 ENCOUNTER — Encounter: Payer: Self-pay | Admitting: Hematology

## 2023-06-12 ENCOUNTER — Other Ambulatory Visit: Payer: Self-pay

## 2023-06-12 ENCOUNTER — Inpatient Hospital Stay (HOSPITAL_BASED_OUTPATIENT_CLINIC_OR_DEPARTMENT_OTHER): Payer: 59 | Admitting: Hematology

## 2023-06-12 ENCOUNTER — Inpatient Hospital Stay: Payer: 59

## 2023-06-12 ENCOUNTER — Inpatient Hospital Stay: Payer: 59 | Admitting: Nutrition

## 2023-06-12 ENCOUNTER — Ambulatory Visit
Admission: RE | Admit: 2023-06-12 | Discharge: 2023-06-12 | Disposition: A | Discharge status: Home / Self Care | Payer: 59 | Source: Ambulatory Visit | Attending: Radiation Oncology | Admitting: Radiation Oncology

## 2023-06-12 VITALS — BP 104/72 | HR 78 | Temp 97.7°F | Resp 18 | Wt 168.2 lb

## 2023-06-12 DIAGNOSIS — Z931 Gastrostomy status: Secondary | ICD-10-CM | POA: Diagnosis not present

## 2023-06-12 DIAGNOSIS — Z510 Encounter for antineoplastic radiation therapy: Secondary | ICD-10-CM | POA: Diagnosis not present

## 2023-06-12 DIAGNOSIS — C155 Malignant neoplasm of lower third of esophagus: Secondary | ICD-10-CM

## 2023-06-12 DIAGNOSIS — Z7901 Long term (current) use of anticoagulants: Secondary | ICD-10-CM | POA: Diagnosis not present

## 2023-06-12 DIAGNOSIS — C159 Malignant neoplasm of esophagus, unspecified: Secondary | ICD-10-CM

## 2023-06-12 DIAGNOSIS — E43 Unspecified severe protein-calorie malnutrition: Secondary | ICD-10-CM | POA: Diagnosis not present

## 2023-06-12 DIAGNOSIS — Z5111 Encounter for antineoplastic chemotherapy: Secondary | ICD-10-CM | POA: Diagnosis not present

## 2023-06-12 LAB — CBC WITH DIFFERENTIAL (CANCER CENTER ONLY)
Abs Immature Granulocytes: 0.03 10*3/uL (ref 0.00–0.07)
Basophils Absolute: 0 10*3/uL (ref 0.0–0.1)
Basophils Relative: 0 %
Eosinophils Absolute: 0 10*3/uL (ref 0.0–0.5)
Eosinophils Relative: 0 %
HCT: 41 % (ref 39.0–52.0)
Hemoglobin: 13.8 g/dL (ref 13.0–17.0)
Immature Granulocytes: 1 %
Lymphocytes Relative: 16 %
Lymphs Abs: 0.4 10*3/uL — ABNORMAL LOW (ref 0.7–4.0)
MCH: 30.4 pg (ref 26.0–34.0)
MCHC: 33.7 g/dL (ref 30.0–36.0)
MCV: 90.3 fL (ref 80.0–100.0)
Monocytes Absolute: 0.2 10*3/uL (ref 0.1–1.0)
Monocytes Relative: 10 %
Neutro Abs: 1.7 10*3/uL (ref 1.7–7.7)
Neutrophils Relative %: 73 %
Platelet Count: 155 10*3/uL (ref 150–400)
RBC: 4.54 MIL/uL (ref 4.22–5.81)
RDW: 15.6 % — ABNORMAL HIGH (ref 11.5–15.5)
WBC Count: 2.4 10*3/uL — ABNORMAL LOW (ref 4.0–10.5)
nRBC: 0 % (ref 0.0–0.2)

## 2023-06-12 LAB — CMP (CANCER CENTER ONLY)
ALT: 18 U/L (ref 0–44)
AST: 16 U/L (ref 15–41)
Albumin: 3.3 g/dL — ABNORMAL LOW (ref 3.5–5.0)
Alkaline Phosphatase: 63 U/L (ref 38–126)
Anion gap: 7 (ref 5–15)
BUN: 26 mg/dL — ABNORMAL HIGH (ref 8–23)
CO2: 25 mmol/L (ref 22–32)
Calcium: 8.7 mg/dL — ABNORMAL LOW (ref 8.9–10.3)
Chloride: 105 mmol/L (ref 98–111)
Creatinine: 0.65 mg/dL (ref 0.61–1.24)
GFR, Estimated: >60 mL/min (ref >60)
Glucose, Bld: 143 mg/dL — ABNORMAL HIGH (ref 70–99)
Potassium: 4.2 mmol/L (ref 3.5–5.1)
Sodium: 137 mmol/L (ref 135–145)
Total Bilirubin: 0.6 mg/dL (ref <1.2)
Total Protein: 5.8 g/dL — ABNORMAL LOW (ref 6.5–8.1)

## 2023-06-12 LAB — RAD ONC ARIA SESSION SUMMARY
Course Elapsed Days: 21
Plan Fractions Treated to Date: 14
Plan Prescribed Dose Per Fraction: 1.8 Gy
Plan Total Fractions Prescribed: 25
Plan Total Prescribed Dose: 45 Gy
Reference Point Dosage Given to Date: 25.2 Gy
Reference Point Session Dosage Given: 1.8 Gy
Session Number: 14

## 2023-06-12 MED ORDER — SODIUM CHLORIDE 0.9 % IV SOLN: INTRAVENOUS | Status: DC

## 2023-06-12 MED ORDER — CETIRIZINE HCL 10 MG/ML IV SOLN
10.0000 mg | Freq: Once | INTRAVENOUS | Status: AC
  Administered 2023-06-12: 10 mg via INTRAVENOUS
  Filled 2023-06-12: qty 1

## 2023-06-12 MED ORDER — SODIUM CHLORIDE 0.9 % IV SOLN
184.2000 mg | Freq: Once | INTRAVENOUS | Status: AC
  Administered 2023-06-12: 180 mg via INTRAVENOUS
  Filled 2023-06-12: qty 18

## 2023-06-12 MED ORDER — PALONOSETRON HCL INJECTION 0.25 MG/5ML
0.2500 mg | Freq: Once | INTRAVENOUS | Status: AC
  Administered 2023-06-12: 0.25 mg via INTRAVENOUS
  Filled 2023-06-12: qty 5

## 2023-06-12 MED ORDER — FAMOTIDINE IN NACL 20-0.9 MG/50ML-% IV SOLN
20.0000 mg | Freq: Once | INTRAVENOUS | Status: AC
  Administered 2023-06-12: 20 mg via INTRAVENOUS
  Filled 2023-06-12: qty 50

## 2023-06-12 MED ORDER — SODIUM CHLORIDE 0.9 % IV SOLN
50.0000 mg/m2 | Freq: Once | INTRAVENOUS | Status: AC
  Administered 2023-06-12: 96 mg via INTRAVENOUS
  Filled 2023-06-12: qty 16

## 2023-06-12 MED ORDER — DEXAMETHASONE SODIUM PHOSPHATE 10 MG/ML IJ SOLN
10.0000 mg | Freq: Once | INTRAMUSCULAR | Status: AC
  Administered 2023-06-12: 10 mg via INTRAVENOUS
  Filled 2023-06-12: qty 1

## 2023-06-12 NOTE — Progress Notes (Signed)
[facility]   [phone] [phone]   Clinic Follow up Note   Patient Care Team: [name], MD as PCP - General (Family Medicine) [name], MD as PCP - Cardiology (Cardiology) [name], MD as Consulting Physician (Hematology and Oncology)  Date of Service:  06/12/2023  CHIEF COMPLAINT: f/u of esophageal cancer  CURRENT THERAPY:  Concurrent chemoradiation with weekly carboplatin and Taxol  Oncology History   Malignant neoplasm of esophagus (HCC) -cTxN0M0 -Patient presented to hospital with progressive dysphagia and weight loss. -EGD showed a malignant appearing severe stenosis 30 to 34 cm from incisors.  The stenosis measured 4 cm, the scope was not able to pass.  Biopsy confirmed adenocarcinoma -He underwent PEG feeding tube for nutrition -PET scan showed hypermetabolic lesion in the distal esophagus, no nodal or distant metastasis.   -pt was evaluated by cardiothoracic surgeon Dr. Cliffton Asters, he is not a good candidate for surgery, but will re-evaluate him after chemoradiation -He started concurrent chemoradiation with weekly carbo platinum and Taxol on 05/22/23, he is tolerating moderately well overall    Assessment and Plan    Esophageal Cancer 77 year old with esophageal cancer undergoing chemotherapy and radiation therapy, currently halfway through both treatments. No significant adverse effects from recent chemo or radiation. Reports dry mouth managed with ice-cold water. Blood counts are dropping due to chemotherapy but remain adequate for continued treatment.  - Continue chemotherapy and radiation as scheduled - Monitor lab regularly - Discuss any new symptoms or side effects promptly  Wound Care at PEG insertion site  Seepage and crusting around the abdominal wound site at PEG following physical therapy. No current pain or active bleeding. Wound needs cleaning. Advised to communicate physical therapy limitations to  therapist to prevent further issues. - Refer to infusion nurse for wound cleaning - Advise to communicate physical therapy limitations to therapist  Malnutrition -On tube feeds, he is tolerating well -Follow-up with dietitian, he has appointment today  Plan -Lab reviewed, adequate for treatment, will proceed week for chemo carboplatin and Taxol today -Follow-up next week before chemo -Continue radiation     SUMMARY OF ONCOLOGIC HISTORY: Oncology History  Malignant neoplasm of esophagus (HCC)  04/18/2023 Cancer Staging   Staging form: Esophagus - Adenocarcinoma, AJCC 8th Edition - Clinical stage from 04/18/2023: Stage Unknown (cTX, cN0, cM0) - Signed by [name], MD on 05/04/2023 Total positive nodes: 0   04/20/2023 Initial Diagnosis   Malignant neoplasm of lower third of esophagus (HCC)   05/22/2023 -  Chemotherapy   Patient is on Treatment Plan : ESOPHAGUS Carboplatin + Paclitaxel Weekly X 6 Weeks with XRT        Discussed the use of AI scribe software for clinical note transcription with the patient, who gave verbal consent to proceed.  History of Present Illness   Samuel [name], a 77 year old gentleman with esophageal cancer, presents for follow-up. He reports a complication with his physical therapy regimen, which involved jumping exercises that caused his abdominal patch to seep. The seepage has since stopped, but the area has become crusty and may require cleaning. He denies pain at the site.  He is currently undergoing chemotherapy and radiation, with no reported side effects. He is halfway through his treatment regimen. He reports dry mouth, which he manages by rinsing with ice-cold water. He denies any increase in saliva production or difficulty swallowing.  He also reports some issues with his medication regimen. He has been using 4mg  Zofran instead of the prescribed 10mg ,  which he finds effective. He has questions about the use of water for irrigation through his  feeding tube. He has been taking Eliquis last during his feeding regimen, as taking it first resulted in minor nose bleeding.  He is also receiving physical therapy twice a week, but plans to discuss reducing the intensity of the exercises with his therapist. He has been managing his care at home with the help of two caregivers and his wife.         All other systems were reviewed with the patient and are negative.  MEDICAL HISTORY:  Past Medical History:  Diagnosis Date   Diabetes mellitus without complication (HCC)    Esophageal cancer (HCC)    Pulmonary embolism (HCC)    In October 2024    SURGICAL HISTORY: Past Surgical History:  Procedure Laterality Date   BIOPSY  04/18/2023   Procedure: BIOPSY;  Surgeon: Lynann Bologna, DO;  Location: [facility] ENDOSCOPY;  Service: Gastroenterology;;   ESOPHAGOGASTRODUODENOSCOPY N/A 04/18/2023   Procedure: ESOPHAGOGASTRODUODENOSCOPY (EGD);  Surgeon: Lynann Bologna, DO;  Location: [facility] ENDOSCOPY;  Service: Gastroenterology;  Laterality: N/A;   IR GASTROSTOMY TUBE MOD SED  04/20/2023   IR NASO G TUBE PLC W/FL W/RAD  04/20/2023   KNEE ARTHROPLASTY Bilateral     I have reviewed the social history and family history with the patient and they are unchanged from previous note.  ALLERGIES:  is allergic to penicillins, sulfa antibiotics, and procaine.  MEDICATIONS:  Current Outpatient Medications  Medication Sig Dispense Refill   apixaban (ELIQUIS) 5 MG TABS tablet Place 1 tablet (5 mg total) into feeding tube 2 (two) times daily. 60 tablet 2   Blood Glucose Monitoring Suppl (ACCU-CHEK GUIDE ME) w/Device KIT USE AS DIRECTED 3 TIMES A DAY 1 kit 0   dexamethasone (DECADRON) 4 MG tablet Take 2 tablets daily for 2 days, start the day after chemotherapy. Take with food.     glucose blood (ACCU-CHEK GUIDE) test strip Use as instructed daily 100 each 12   ondansetron (ZOFRAN) 4 MG tablet Take 1 tablet (4 mg total) by mouth every 8 (eight) hours as  needed for nausea or vomiting. 20 tablet 2   prochlorperazine (COMPAZINE) 10 MG tablet Take 1 tablet (10 mg total) by mouth every 6 (six) hours as needed for nausea or vomiting. (Patient not taking: Reported on 06/02/2023)     Water For Irrigation, Sterile (FREE WATER) SOLN Place 120 mLs into feeding tube 6 (six) times daily. 24000 mL 0   No current facility-administered medications for this visit.    PHYSICAL EXAMINATION: ECOG PERFORMANCE STATUS: 2 - Symptomatic, <50% confined to bed  Vitals:   06/12/23 0919  BP: 104/72  Pulse: 78  Resp: 18  Temp: 97.7 F (36.5 C)  SpO2: 98%   Wt Readings from Last 3 Encounters:  06/12/23 168 lb 3.2 oz (76.3 kg)  06/05/23 169 lb 12.8 oz (77 kg)  05/29/23 170 lb 4 oz (77.2 kg)     GENERAL:alert, no distress and comfortable SKIN: skin color, texture, turgor are normal, no rashes or significant lesions EYES: normal, Conjunctiva are pink and non-injected, sclera clear NECK: supple, thyroid normal size, non-tender, without nodularity LYMPH:  no palpable lymphadenopathy in the cervical, axillary  LUNGS: clear to auscultation and percussion with normal breathing effort HEART: regular rate & rhythm and no murmurs and no lower extremity edema ABDOMEN:abdomen soft, non-tender and normal bowel sounds, (+) PEG site has old blood and crust  Musculoskeletal:no cyanosis of  digits and no clubbing  NEURO: alert & oriented x 3 with fluent speech, no focal motor/sensory deficits    LABORATORY DATA:  I have reviewed the data as listed    Latest Ref Rng & Units 06/12/2023    9:04 AM 06/05/2023    9:23 AM 05/29/2023   10:06 AM  CBC  WBC 4.0 - 10.5 K/uL 2.4  3.1  8.4   Hemoglobin 13.0 - 17.0 g/dL 16.1  09.6  04.5   Hematocrit 39.0 - 52.0 % 41.0  43.0  43.8   Platelets 150 - 400 K/uL 155  196  214         Latest Ref Rng & Units 06/05/2023    9:23 AM 05/29/2023   10:06 AM 05/24/2023    5:35 AM  CMP  Glucose 70 - 99 mg/dL 409  811  914   BUN 8 - 23  mg/dL 24  29  20    Creatinine 0.61 - 1.24 mg/dL 7.82  9.56  2.13   Sodium 135 - 145 mmol/L 138  137  138   Potassium 3.5 - 5.1 mmol/L 4.0  4.3  4.0   Chloride 98 - 111 mmol/L 105  106  108   CO2 22 - 32 mmol/L 26  25  24    Calcium 8.9 - 10.3 mg/dL 8.5  8.7  8.4   Total Protein 6.5 - 8.1 g/dL 5.6  6.2  5.9   Total Bilirubin <1.2 mg/dL 0.8  0.6  0.9   Alkaline Phos 38 - 126 U/L 60  71  62   AST 15 - 41 U/L 15  16  23    ALT 0 - 44 U/L 17  14  17        RADIOGRAPHIC STUDIES: I have personally reviewed the radiological images as listed and agreed with the findings in the report. No results found.    No orders of the defined types were placed in this encounter.  All questions were answered. The patient knows to call the clinic with any problems, questions or concerns. No barriers to learning was detected. The total time spent in the appointment was 25 minutes.     [name], MD 06/12/2023

## 2023-06-12 NOTE — Patient Instructions (Signed)
 [facility] - A DEPT OF [facility]  Discharge Instructions: Thank you for choosing [facility] to provide your oncology and hematology care.   If you have a lab appointment with the Cancer Center, please go directly to the Cancer Center and check in at the registration area.   Wear comfortable clothing and clothing appropriate for easy access to any Portacath or PICC line.   We strive to give you quality time with your provider. You may need to reschedule your appointment if you arrive late (15 or more minutes).  Arriving late affects you and other patients whose appointments are after yours.  Also, if you miss three or more appointments without notifying the office, you may be dismissed from the clinic at the provider's discretion.      For prescription refill requests, have your pharmacy contact our office and allow 72 hours for refills to be completed.    Today you received the following chemotherapy and/or immunotherapy agents Taxol / Carboplatin      To help prevent nausea and vomiting after your treatment, we encourage you to take your nausea medication as directed.  BELOW ARE SYMPTOMS THAT SHOULD BE REPORTED IMMEDIATELY: *FEVER GREATER THAN 100.4 F (38 C) OR HIGHER *CHILLS OR SWEATING *NAUSEA AND VOMITING THAT IS NOT CONTROLLED WITH YOUR NAUSEA MEDICATION *UNUSUAL SHORTNESS OF BREATH *UNUSUAL BRUISING OR BLEEDING *URINARY PROBLEMS (pain or burning when urinating, or frequent urination) *BOWEL PROBLEMS (unusual diarrhea, constipation, pain near the anus) TENDERNESS IN MOUTH AND THROAT WITH OR WITHOUT PRESENCE OF ULCERS (sore throat, sores in mouth, or a toothache) UNUSUAL RASH, SWELLING OR PAIN  UNUSUAL VAGINAL DISCHARGE OR ITCHING   Items with * indicate a potential emergency and should be followed up as soon as possible or go to the Emergency Department if any problems should occur.  Please show the CHEMOTHERAPY ALERT CARD or  IMMUNOTHERAPY ALERT CARD at check-in to the Emergency Department and triage nurse.  Should you have questions after your visit or need to cancel or reschedule your appointment, please contact [facility] - A DEPT OF [facility]  Dept: [phone]  and follow the prompts.  Office hours are 8:00 a.m. to 4:30 p.m. Monday - Friday. Please note that voicemails left after 4:00 p.m. may not be returned until the following business day.  We are closed weekends and major holidays. You have access to a nurse at all times for urgent questions. Please call the main number to the clinic Dept: [phone] and follow the prompts.   For any non-urgent questions, you may also contact your provider using MyChart. We now offer e-Visits for anyone 33 and older to request care online for non-urgent symptoms. For details visit [email or web address].   Also download the MyChart app! Go to the app store, search "MyChart", open the app, select [place], and log in with your MyChart username and password.

## 2023-06-12 NOTE — Progress Notes (Signed)
Nutrition follow up completed with patient during infusion for esophageal cancer.   He receives concurrent chemoradiation therapy with carboplatin and Taxol.  Patient is followed by Dr. Mosetta Putt and Dr. Mitzi Hansen.   Weight is stable and documented as 168 lb 3.2 oz pounds December 9.  Patient weighed 169.8 pounds Dec 2.   Noted labs: Glucose 143, BUN 26, albumin 3.3.   Estimated nutrition needs: 2100-2300 cal, 110-125 g protein, greater than 2.2 L fluid.   Patient reports tolerating 2 cartons of Osmolite 1.5 three times daily via PEG.  Provide 30 mL Prosource 3 times a day via PEG.  He denies nausea, vomiting, constipation, and diarrhea.  He takes nausea medication as needed. Ice cold water helps him to be able to spit mucus out of his mouth. Noted PEG site needed to be cleaned per MD. PEG site does not look like he has cleaned or changed the dressing for several weeks. (Note on bandage with date of Nov 20) Patient states he was told to leave it alone and not touch it. RN removed bandages and cleaned around PEG. Education provided to clean site daily and change dressing. Patient does not think he can do this and states he has nobody to help him change the dressing.   Tube feeding: 6 cartons Osmolite 1.5+30 mL Prosource TF 3 times daily plus free water flushes provides 2250 cal, 123 g protein.  Receives approximately 2 L of free water flushes in addition to free water via Osmolite 1.5.   Nutrition diagnosis: Severe malnutrition, ongoing.   Intervention: Educated to continue 6 cartons Osmolite 1.5 daily with 30 mL Prosource 3 times daily via PEG. Continue free water flushes to provide adequate hydration. Reviewed importance of flushing a minimum of 30 mL's free water after each medication via tube. Continue medications as needed to manage nausea or constipation. Clean PEG site daily. Ask for home health referral to help manage PEG cleaning.   Monitoring, evaluation, goals: Patient will tolerate adequate  calories and protein to minimize weight loss.   Next visit: Monday, Dec 16 during infusion.

## 2023-06-12 NOTE — Progress Notes (Signed)
Referral sent to [facility] for [facility] Tube Dressing Care.  Awaiting acceptance of pt by [facility].

## 2023-06-13 ENCOUNTER — Ambulatory Visit
Admission: RE | Admit: 2023-06-13 | Discharge: 2023-06-13 | Disposition: A | Discharge status: Home / Self Care | Payer: 59 | Source: Ambulatory Visit | Attending: Radiation Oncology

## 2023-06-13 ENCOUNTER — Other Ambulatory Visit: Payer: Self-pay

## 2023-06-13 ENCOUNTER — Inpatient Hospital Stay: Payer: 59

## 2023-06-13 DIAGNOSIS — J9811 Atelectasis: Secondary | ICD-10-CM | POA: Diagnosis not present

## 2023-06-13 DIAGNOSIS — Z96653 Presence of artificial knee joint, bilateral: Secondary | ICD-10-CM | POA: Diagnosis not present

## 2023-06-13 DIAGNOSIS — E860 Dehydration: Secondary | ICD-10-CM | POA: Diagnosis not present

## 2023-06-13 DIAGNOSIS — I2699 Other pulmonary embolism without acute cor pulmonale: Secondary | ICD-10-CM | POA: Diagnosis not present

## 2023-06-13 DIAGNOSIS — I700 Atherosclerosis of aorta: Secondary | ICD-10-CM | POA: Diagnosis not present

## 2023-06-13 DIAGNOSIS — K8020 Calculus of gallbladder without cholecystitis without obstruction: Secondary | ICD-10-CM | POA: Diagnosis not present

## 2023-06-13 DIAGNOSIS — C159 Malignant neoplasm of esophagus, unspecified: Secondary | ICD-10-CM | POA: Diagnosis not present

## 2023-06-13 DIAGNOSIS — I5032 Chronic diastolic (congestive) heart failure: Secondary | ICD-10-CM | POA: Diagnosis not present

## 2023-06-13 DIAGNOSIS — C155 Malignant neoplasm of lower third of esophagus: Secondary | ICD-10-CM | POA: Diagnosis not present

## 2023-06-13 DIAGNOSIS — Z9181 History of falling: Secondary | ICD-10-CM | POA: Diagnosis not present

## 2023-06-13 DIAGNOSIS — E43 Unspecified severe protein-calorie malnutrition: Secondary | ICD-10-CM | POA: Diagnosis not present

## 2023-06-13 DIAGNOSIS — Z510 Encounter for antineoplastic radiation therapy: Secondary | ICD-10-CM | POA: Diagnosis not present

## 2023-06-13 DIAGNOSIS — Z5111 Encounter for antineoplastic chemotherapy: Secondary | ICD-10-CM | POA: Diagnosis not present

## 2023-06-13 DIAGNOSIS — E119 Type 2 diabetes mellitus without complications: Secondary | ICD-10-CM | POA: Diagnosis not present

## 2023-06-13 DIAGNOSIS — E8809 Other disorders of plasma-protein metabolism, not elsewhere classified: Secondary | ICD-10-CM | POA: Diagnosis not present

## 2023-06-13 DIAGNOSIS — Z7901 Long term (current) use of anticoagulants: Secondary | ICD-10-CM | POA: Diagnosis not present

## 2023-06-13 DIAGNOSIS — Z931 Gastrostomy status: Secondary | ICD-10-CM | POA: Diagnosis not present

## 2023-06-13 DIAGNOSIS — K5730 Diverticulosis of large intestine without perforation or abscess without bleeding: Secondary | ICD-10-CM | POA: Diagnosis not present

## 2023-06-13 DIAGNOSIS — Z85820 Personal history of malignant melanoma of skin: Secondary | ICD-10-CM | POA: Diagnosis not present

## 2023-06-13 DIAGNOSIS — Z87891 Personal history of nicotine dependence: Secondary | ICD-10-CM | POA: Diagnosis not present

## 2023-06-13 LAB — RAD ONC ARIA SESSION SUMMARY
Course Elapsed Days: 22
Plan Fractions Treated to Date: 15
Plan Prescribed Dose Per Fraction: 1.8 Gy
Plan Total Fractions Prescribed: 25
Plan Total Prescribed Dose: 45 Gy
Reference Point Dosage Given to Date: 27 Gy
Reference Point Session Dosage Given: 1.8 Gy
Session Number: 15

## 2023-06-14 ENCOUNTER — Ambulatory Visit
Admission: RE | Admit: 2023-06-14 | Discharge: 2023-06-14 | Disposition: A | Discharge status: Home / Self Care | Payer: 59 | Source: Ambulatory Visit | Attending: Radiation Oncology | Admitting: Radiation Oncology

## 2023-06-14 ENCOUNTER — Inpatient Hospital Stay: Payer: 59

## 2023-06-14 ENCOUNTER — Other Ambulatory Visit: Payer: Self-pay

## 2023-06-14 DIAGNOSIS — Z931 Gastrostomy status: Secondary | ICD-10-CM | POA: Diagnosis not present

## 2023-06-14 DIAGNOSIS — Z510 Encounter for antineoplastic radiation therapy: Secondary | ICD-10-CM | POA: Diagnosis not present

## 2023-06-14 DIAGNOSIS — Z5111 Encounter for antineoplastic chemotherapy: Secondary | ICD-10-CM | POA: Diagnosis not present

## 2023-06-14 DIAGNOSIS — C155 Malignant neoplasm of lower third of esophagus: Secondary | ICD-10-CM | POA: Diagnosis not present

## 2023-06-14 DIAGNOSIS — Z7901 Long term (current) use of anticoagulants: Secondary | ICD-10-CM | POA: Diagnosis not present

## 2023-06-14 DIAGNOSIS — E43 Unspecified severe protein-calorie malnutrition: Secondary | ICD-10-CM | POA: Diagnosis not present

## 2023-06-14 LAB — RAD ONC ARIA SESSION SUMMARY
Course Elapsed Days: 23
Plan Fractions Treated to Date: 16
Plan Prescribed Dose Per Fraction: 1.8 Gy
Plan Total Fractions Prescribed: 25
Plan Total Prescribed Dose: 45 Gy
Reference Point Dosage Given to Date: 28.8 Gy
Reference Point Session Dosage Given: 1.8 Gy
Session Number: 16

## 2023-06-15 ENCOUNTER — Ambulatory Visit
Admission: RE | Admit: 2023-06-15 | Discharge: 2023-06-15 | Disposition: A | Discharge status: Home / Self Care | Payer: 59 | Source: Ambulatory Visit | Attending: Radiation Oncology | Admitting: Radiation Oncology

## 2023-06-15 ENCOUNTER — Other Ambulatory Visit: Payer: Self-pay

## 2023-06-15 ENCOUNTER — Inpatient Hospital Stay: Payer: 59

## 2023-06-15 DIAGNOSIS — Z5111 Encounter for antineoplastic chemotherapy: Secondary | ICD-10-CM | POA: Diagnosis not present

## 2023-06-15 DIAGNOSIS — C155 Malignant neoplasm of lower third of esophagus: Secondary | ICD-10-CM | POA: Diagnosis not present

## 2023-06-15 DIAGNOSIS — Z7901 Long term (current) use of anticoagulants: Secondary | ICD-10-CM | POA: Diagnosis not present

## 2023-06-15 DIAGNOSIS — Z510 Encounter for antineoplastic radiation therapy: Secondary | ICD-10-CM | POA: Diagnosis not present

## 2023-06-15 DIAGNOSIS — Z931 Gastrostomy status: Secondary | ICD-10-CM | POA: Diagnosis not present

## 2023-06-15 DIAGNOSIS — E43 Unspecified severe protein-calorie malnutrition: Secondary | ICD-10-CM | POA: Diagnosis not present

## 2023-06-15 LAB — RAD ONC ARIA SESSION SUMMARY
Course Elapsed Days: 24
Plan Fractions Treated to Date: 17
Plan Prescribed Dose Per Fraction: 1.8 Gy
Plan Total Fractions Prescribed: 25
Plan Total Prescribed Dose: 45 Gy
Reference Point Dosage Given to Date: 30.6 Gy
Reference Point Session Dosage Given: 1.8 Gy
Session Number: 17

## 2023-06-16 ENCOUNTER — Other Ambulatory Visit: Payer: Self-pay

## 2023-06-16 ENCOUNTER — Inpatient Hospital Stay: Payer: 59

## 2023-06-16 ENCOUNTER — Ambulatory Visit
Admission: RE | Admit: 2023-06-16 | Discharge: 2023-06-16 | Disposition: A | Discharge status: Home / Self Care | Payer: 59 | Source: Ambulatory Visit | Attending: Radiation Oncology

## 2023-06-16 DIAGNOSIS — Z931 Gastrostomy status: Secondary | ICD-10-CM | POA: Diagnosis not present

## 2023-06-16 DIAGNOSIS — C155 Malignant neoplasm of lower third of esophagus: Secondary | ICD-10-CM | POA: Diagnosis not present

## 2023-06-16 DIAGNOSIS — Z510 Encounter for antineoplastic radiation therapy: Secondary | ICD-10-CM | POA: Diagnosis not present

## 2023-06-16 DIAGNOSIS — Z7901 Long term (current) use of anticoagulants: Secondary | ICD-10-CM | POA: Diagnosis not present

## 2023-06-16 DIAGNOSIS — Z5111 Encounter for antineoplastic chemotherapy: Secondary | ICD-10-CM | POA: Diagnosis not present

## 2023-06-16 DIAGNOSIS — E43 Unspecified severe protein-calorie malnutrition: Secondary | ICD-10-CM | POA: Diagnosis not present

## 2023-06-16 LAB — RAD ONC ARIA SESSION SUMMARY
Course Elapsed Days: 25
Plan Fractions Treated to Date: 18
Plan Prescribed Dose Per Fraction: 1.8 Gy
Plan Total Fractions Prescribed: 25
Plan Total Prescribed Dose: 45 Gy
Reference Point Dosage Given to Date: 32.4 Gy
Reference Point Session Dosage Given: 1.8 Gy
Session Number: 18

## 2023-06-19 ENCOUNTER — Other Ambulatory Visit: Payer: Self-pay

## 2023-06-19 ENCOUNTER — Inpatient Hospital Stay: Payer: 59 | Admitting: Nutrition

## 2023-06-19 ENCOUNTER — Inpatient Hospital Stay: Payer: 59

## 2023-06-19 ENCOUNTER — Inpatient Hospital Stay (HOSPITAL_BASED_OUTPATIENT_CLINIC_OR_DEPARTMENT_OTHER): Payer: 59 | Admitting: Nurse Practitioner

## 2023-06-19 ENCOUNTER — Ambulatory Visit
Admission: RE | Admit: 2023-06-19 | Discharge: 2023-06-19 | Disposition: A | Discharge status: Home / Self Care | Payer: 59 | Source: Ambulatory Visit | Attending: Radiation Oncology | Admitting: Radiation Oncology

## 2023-06-19 VITALS — BP 109/91 | HR 95 | Temp 97.9°F | Resp 16 | Wt 169.5 lb

## 2023-06-19 DIAGNOSIS — E43 Unspecified severe protein-calorie malnutrition: Secondary | ICD-10-CM | POA: Diagnosis not present

## 2023-06-19 DIAGNOSIS — Z931 Gastrostomy status: Secondary | ICD-10-CM | POA: Diagnosis not present

## 2023-06-19 DIAGNOSIS — C155 Malignant neoplasm of lower third of esophagus: Secondary | ICD-10-CM

## 2023-06-19 DIAGNOSIS — C159 Malignant neoplasm of esophagus, unspecified: Secondary | ICD-10-CM

## 2023-06-19 DIAGNOSIS — Z510 Encounter for antineoplastic radiation therapy: Secondary | ICD-10-CM | POA: Diagnosis not present

## 2023-06-19 DIAGNOSIS — Z7901 Long term (current) use of anticoagulants: Secondary | ICD-10-CM | POA: Diagnosis not present

## 2023-06-19 DIAGNOSIS — Z5111 Encounter for antineoplastic chemotherapy: Secondary | ICD-10-CM | POA: Diagnosis not present

## 2023-06-19 LAB — CMP (CANCER CENTER ONLY)
ALT: 17 U/L (ref 0–44)
AST: 17 U/L (ref 15–41)
Albumin: 3.3 g/dL — ABNORMAL LOW (ref 3.5–5.0)
Alkaline Phosphatase: 56 U/L (ref 38–126)
Anion gap: 5 (ref 5–15)
BUN: 27 mg/dL — ABNORMAL HIGH (ref 8–23)
CO2: 24 mmol/L (ref 22–32)
Calcium: 8.6 mg/dL — ABNORMAL LOW (ref 8.9–10.3)
Chloride: 107 mmol/L (ref 98–111)
Creatinine: 0.61 mg/dL (ref 0.61–1.24)
GFR, Estimated: >60 mL/min (ref >60)
Glucose, Bld: 97 mg/dL (ref 70–99)
Potassium: 4.4 mmol/L (ref 3.5–5.1)
Sodium: 136 mmol/L (ref 135–145)
Total Bilirubin: 0.6 mg/dL (ref <1.2)
Total Protein: 5.6 g/dL — ABNORMAL LOW (ref 6.5–8.1)

## 2023-06-19 LAB — RAD ONC ARIA SESSION SUMMARY
Course Elapsed Days: 28
Plan Fractions Treated to Date: 19
Plan Prescribed Dose Per Fraction: 1.8 Gy
Plan Total Fractions Prescribed: 25
Plan Total Prescribed Dose: 45 Gy
Reference Point Dosage Given to Date: 34.2 Gy
Reference Point Session Dosage Given: 1.8 Gy
Session Number: 19

## 2023-06-19 LAB — CBC WITH DIFFERENTIAL (CANCER CENTER ONLY)
Abs Immature Granulocytes: 0.03 10*3/uL (ref 0.00–0.07)
Basophils Absolute: 0 10*3/uL (ref 0.0–0.1)
Basophils Relative: 1 %
Eosinophils Absolute: 0 10*3/uL (ref 0.0–0.5)
Eosinophils Relative: 0 %
HCT: 39.4 % (ref 39.0–52.0)
Hemoglobin: 13.2 g/dL (ref 13.0–17.0)
Immature Granulocytes: 2 %
Lymphocytes Relative: 16 %
Lymphs Abs: 0.2 10*3/uL — ABNORMAL LOW (ref 0.7–4.0)
MCH: 30.7 pg (ref 26.0–34.0)
MCHC: 33.5 g/dL (ref 30.0–36.0)
MCV: 91.6 fL (ref 80.0–100.0)
Monocytes Absolute: 0.2 10*3/uL (ref 0.1–1.0)
Monocytes Relative: 11 %
Neutro Abs: 1 10*3/uL — ABNORMAL LOW (ref 1.7–7.7)
Neutrophils Relative %: 70 %
Platelet Count: 127 10*3/uL — ABNORMAL LOW (ref 150–400)
RBC: 4.3 MIL/uL (ref 4.22–5.81)
RDW: 16.3 % — ABNORMAL HIGH (ref 11.5–15.5)
WBC Count: 1.4 10*3/uL — ABNORMAL LOW (ref 4.0–10.5)
nRBC: 2.1 % — ABNORMAL HIGH (ref 0.0–0.2)

## 2023-06-19 MED ORDER — FAMOTIDINE IN NACL 20-0.9 MG/50ML-% IV SOLN
20.0000 mg | Freq: Once | INTRAVENOUS | Status: AC
  Administered 2023-06-19: 20 mg via INTRAVENOUS
  Filled 2023-06-19: qty 50

## 2023-06-19 MED ORDER — DEXAMETHASONE SODIUM PHOSPHATE 10 MG/ML IJ SOLN
10.0000 mg | Freq: Once | INTRAMUSCULAR | Status: AC
  Administered 2023-06-19: 10 mg via INTRAVENOUS
  Filled 2023-06-19: qty 1

## 2023-06-19 MED ORDER — CARBOPLATIN CHEMO INJECTION 450 MG/45ML
184.2000 mg | Freq: Once | INTRAVENOUS | Status: AC
  Administered 2023-06-19: 180 mg via INTRAVENOUS
  Filled 2023-06-19: qty 18

## 2023-06-19 MED ORDER — SODIUM CHLORIDE 0.9 % IV SOLN: INTRAVENOUS | Status: DC

## 2023-06-19 MED ORDER — PALONOSETRON HCL INJECTION 0.25 MG/5ML
0.2500 mg | Freq: Once | INTRAVENOUS | Status: AC
  Administered 2023-06-19: 0.25 mg via INTRAVENOUS
  Filled 2023-06-19: qty 5

## 2023-06-19 MED ORDER — SODIUM CHLORIDE 0.9 % IV SOLN
50.0000 mg/m2 | Freq: Once | INTRAVENOUS | Status: AC
  Administered 2023-06-19: 96 mg via INTRAVENOUS
  Filled 2023-06-19: qty 16

## 2023-06-19 MED ORDER — CETIRIZINE HCL 10 MG/ML IV SOLN
10.0000 mg | Freq: Once | INTRAVENOUS | Status: AC
Start: 2023-06-19 — End: 2023-06-19
  Administered 2023-06-19: 10 mg via INTRAVENOUS
  Filled 2023-06-19: qty 1

## 2023-06-19 NOTE — Progress Notes (Signed)
Brief nutrition follow-up with patient during infusion for esophageal cancer.    Weight is stable at 169 pounds 8 ounces.    Labs reviewed.    Patient continues to tolerate 6 cartons of Osmolite 1.5 via PEG along with 30 mL of Prosource 3 times a day.  He denies nausea, vomiting, constipation, diarrhea.  Reports that he is now cleaning his PEG tube site.  He has no questions or concerns today.  Estimated nutrition needs: 2100-2300 cal, 110-125 g protein, greater than 2.2 L fluid.  Nutrition diagnosis: Severe malnutrition, stable.  Intervention: Continue strategies for adequate quit calories and protein intake via PEG. P.O. per MD. Continue PEG tube site care.  Monitoring, evaluation, goals: Patient will tolerate adequate calories and protein to minimize weight loss.  Next visit: Monday, December 23 during infusion with Joli.  **Disclaimer: This note was dictated with voice recognition software. Similar sounding words can inadvertently be transcribed and this note may contain transcription errors which may not have been corrected upon publication of note.**

## 2023-06-19 NOTE — Progress Notes (Signed)
Patient Care Team: [name], MD as PCP - General (Family Medicine) [name], MD as PCP - Cardiology (Cardiology) [name], MD as Consulting Physician (Hematology and Oncology)  Clinic Day:  06/25/2023  Referring physician: [name], MD  ASSESSMENT & PLAN:   Assessment & Plan: Malignant neoplasm of esophagus (HCC) -cTxN0M0 -Patient presented to hospital with progressive dysphagia and weight loss. -EGD showed a malignant appearing severe stenosis 30 to 34 cm from incisors.  The stenosis measured 4 cm, the scope was not able to pass.  Biopsy confirmed adenocarcinoma -He underwent PEG feeding tube for nutrition -PET scan showed hypermetabolic lesion in the distal esophagus, no nodal or distant metastasis.   -pt was evaluated by cardiothoracic surgeon Dr. Cliffton Asters, he is not a good candidate for surgery, but will re-evaluate him after chemoradiation -He started concurrent chemoradiation with weekly carbo platinum and Taxol on 05/22/23, he is tolerating moderately well overall  -06/19/2023 - todeay is cycle 1 day 29  Plan: Labs reviewed  -CBC showing WBC 1.4; Hgb 13.2; Hct 39.4; Plt 127; Anc 1.0 -CMP - K 4.4; glucose 97; BUN 27; Creatinine 0.61; eGFR > 60; Ca 8.6; LFTs normal.   Reviewed labs with Dr. Blake Divine, and agreed that patient was adequate to proceed with treatment today.  This is cycle 1 day 29. Continue radiation as scheduled. Return in 1 week for labs/flush, follow-up, and treatment.    The patient understands the plans discussed today and is in agreement with them.  He knows to contact our office if he develops concerns prior to his next appointment.  I provided 25 minutes of face-to-face time during this encounter and > 50% was spent counseling as documented under my assessment and plan.    Carlean Jews, NP  [facility] - A DEPT OF [facility] [address] [place] [address] Dept:  [phone] Dept Fax: [phone]   No orders of the defined types were placed in this encounter.     CHIEF COMPLAINT:  CC: esophageal cancer   Current Treatment:  concurrent chemoradiation with weekly carboplatin and taxol   INTERVAL HISTORY:  Samuel [name] is here today for repeat clinical assessment. Has was last seen by Dr. Mosetta Putt on 06/12/2023.  He reports he has been able to sip tiny bits of ice cold water.  Reports being able to pass gas and belch.  He reports improvement in abdominal symptoms since changing tube feeds to 3 patches of 2 cartons of Omni light.  He reports being able to do routine activities, but does have some mental fatigue. He denies chest pain, chest pressure, or shortness of breath. He denies headaches or visual disturbances. He denies abdominal pain, nausea, vomiting, or changes in bowel or bladder habits.   He denies fevers or chills. He denies pain. His appetite is good. His weight has been stable.  I have reviewed the past medical history, past surgical history, social history and family history with the patient and they are unchanged from previous note.  ALLERGIES:  is allergic to penicillins, sulfa antibiotics, and procaine.  MEDICATIONS:  Current Outpatient Medications  Medication Sig Dispense Refill   Blood Glucose Monitoring Suppl (ACCU-CHEK GUIDE ME) w/Device KIT USE AS DIRECTED 3 TIMES A DAY (Patient not taking: Reported on 06/22/2023) 1 kit 0   dexamethasone (DECADRON) 4 MG tablet Take 2 tablets daily for 2 days, start the day after chemotherapy. Take with food.  glucose blood (ACCU-CHEK GUIDE) test strip Use as instructed daily (Patient not taking: Reported on 06/22/2023) 100 each 12   ondansetron (ZOFRAN) 4 MG tablet Take 1 tablet (4 mg total) by mouth every 8 (eight) hours as needed for nausea or vomiting. 20 tablet 2   prochlorperazine (COMPAZINE) 10 MG tablet Take 1 tablet (10 mg total) by mouth every 6 (six) hours as needed for nausea or vomiting.  (Patient not taking: Reported on 06/22/2023)     Water For Irrigation, Sterile (FREE WATER) SOLN Place 120 mLs into feeding tube 6 (six) times daily. (Patient not taking: Reported on 06/22/2023) 24000 mL 0   apixaban (ELIQUIS) 5 MG TABS tablet Place 1 tablet (5 mg total) into feeding tube 2 (two) times daily. 60 tablet 1   No current facility-administered medications for this visit.    HISTORY OF PRESENT ILLNESS:   Oncology History  Malignant neoplasm of esophagus (HCC)  04/18/2023 Cancer Staging   Staging form: Esophagus - Adenocarcinoma, AJCC 8th Edition - Clinical stage from 04/18/2023: Stage Unknown (cTX, cN0, cM0) - Signed by [name], MD on 05/04/2023 Total positive nodes: 0   04/20/2023 Initial Diagnosis   Malignant neoplasm of lower third of esophagus (HCC)   05/22/2023 -  Chemotherapy   Patient is on Treatment Plan : ESOPHAGUS Carboplatin + Paclitaxel Weekly X 6 Weeks with XRT         REVIEW OF SYSTEMS:   Constitutional: Denies fevers, chills or abnormal weight loss.  Reports being able to tolerate small sips of ice cold water. Eyes: Denies blurriness of vision Ears, nose, mouth, throat, and face: Denies mucositis or sore throat Respiratory: Denies cough, dyspnea or wheezes Cardiovascular: Denies palpitation, chest discomfort or lower extremity swelling Gastrointestinal:  Denies nausea, heartburn or change in bowel habits.  Has been able to pass gas over the past week. Skin: Denies abnormal skin rashes Lymphatics: Denies new lymphadenopathy or easy bruising Neurological:Denies numbness, tingling or new weaknesses Behavioral/Psych: Mood is stable, no new changes  All other systems were reviewed with the patient and are negative.   VITALS:   Today's Vitals   06/19/23 1351 06/19/23 1355  BP: (!) 109/91   Pulse: 95   Resp: 16   Temp: 97.9 F (36.6 C)   TempSrc: Temporal   SpO2: 97%   Weight: 169 lb 8 oz (76.9 kg)   PainSc:  0-No pain   Body mass index is  25.77 kg/m.   Wt Readings from Last 3 Encounters:  06/22/23 171 lb 12.8 oz (77.9 kg)  06/19/23 169 lb 8 oz (76.9 kg)  06/12/23 168 lb 3.2 oz (76.3 kg)    Body mass index is 25.77 kg/m.  Performance status (ECOG): 1 - Symptomatic but completely ambulatory  PHYSICAL EXAM:   GENERAL:alert, no distress and comfortable SKIN: skin color, texture, turgor are normal, no rashes or significant lesions EYES: normal, Conjunctiva are pink and non-injected, sclera clear OROPHARYNX:no exudate, no erythema and lips, buccal mucosa, and tongue normal  NECK: supple, thyroid normal size, non-tender, without nodularity LYMPH:  no palpable lymphadenopathy in the cervical, axillary or inguinal LUNGS: clear to auscultation and percussion with normal breathing effort HEART: regular rate & rhythm and no murmurs and no lower extremity edema ABDOMEN:abdomen soft, non-tender and normal bowel sounds Musculoskeletal:no cyanosis of digits and no clubbing  NEURO: alert & oriented x 3 with fluent speech, no focal motor/sensory deficits  LABORATORY DATA:  I have reviewed the data as listed    Component Value  Date/Time   NA 136 06/19/2023 1205   NA 144 08/09/2022 1425   K 4.4 06/19/2023 1205   CL 107 06/19/2023 1205   CO2 24 06/19/2023 1205   GLUCOSE 97 06/19/2023 1205   BUN 27 (H) 06/19/2023 1205   BUN 15 08/09/2022 1425   CREATININE 0.61 06/19/2023 1205   CALCIUM 8.6 (L) 06/19/2023 1205   PROT 5.6 (L) 06/19/2023 1205   PROT 6.5 08/09/2022 1425   ALBUMIN 3.3 (L) 06/19/2023 1205   ALBUMIN 4.2 08/09/2022 1425   AST 17 06/19/2023 1205   ALT 17 06/19/2023 1205   ALKPHOS 56 06/19/2023 1205   BILITOT 0.6 06/19/2023 1205   GFRNONAA >60 06/19/2023 1205   GFRAA 83 06/15/2015 0941     Lab Results  Component Value Date   WBC 1.4 (L) 06/19/2023   NEUTROABS 1.0 (L) 06/19/2023   HGB 13.2 06/19/2023   HCT 39.4 06/19/2023   MCV 91.6 06/19/2023   PLT 127 (L) 06/19/2023

## 2023-06-19 NOTE — Assessment & Plan Note (Signed)
-  cTxN0M0 -Patient presented to hospital with progressive dysphagia and weight loss. -EGD showed a malignant appearing severe stenosis 30 to 34 cm from incisors.  The stenosis measured 4 cm, the scope was not able to pass.  Biopsy confirmed adenocarcinoma -He underwent PEG feeding tube for nutrition -PET scan showed hypermetabolic lesion in the distal esophagus, no nodal or distant metastasis.   -pt was evaluated by cardiothoracic surgeon Dr. Cliffton Asters, he is not a good candidate for surgery, but will re-evaluate him after chemoradiation -He started concurrent chemoradiation with weekly carbo platinum and Taxol on 05/22/23, he is tolerating moderately well overall  -06/19/2023 - todeay is cycle 1 day 29

## 2023-06-19 NOTE — Progress Notes (Signed)
Per Vincent Gros, NP, OK to treat today with ANC 1.0.

## 2023-06-20 ENCOUNTER — Inpatient Hospital Stay: Payer: 59

## 2023-06-20 ENCOUNTER — Other Ambulatory Visit: Payer: Self-pay

## 2023-06-20 ENCOUNTER — Ambulatory Visit
Admission: RE | Admit: 2023-06-20 | Discharge: 2023-06-20 | Disposition: A | Discharge status: Home / Self Care | Payer: 59 | Source: Ambulatory Visit | Attending: Radiation Oncology

## 2023-06-20 DIAGNOSIS — Z5111 Encounter for antineoplastic chemotherapy: Secondary | ICD-10-CM | POA: Diagnosis not present

## 2023-06-20 DIAGNOSIS — Z931 Gastrostomy status: Secondary | ICD-10-CM | POA: Diagnosis not present

## 2023-06-20 DIAGNOSIS — C155 Malignant neoplasm of lower third of esophagus: Secondary | ICD-10-CM | POA: Diagnosis not present

## 2023-06-20 DIAGNOSIS — Z510 Encounter for antineoplastic radiation therapy: Secondary | ICD-10-CM | POA: Diagnosis not present

## 2023-06-20 DIAGNOSIS — Z7901 Long term (current) use of anticoagulants: Secondary | ICD-10-CM | POA: Diagnosis not present

## 2023-06-20 DIAGNOSIS — E43 Unspecified severe protein-calorie malnutrition: Secondary | ICD-10-CM | POA: Diagnosis not present

## 2023-06-20 LAB — RAD ONC ARIA SESSION SUMMARY
Course Elapsed Days: 29
Plan Fractions Treated to Date: 20
Plan Prescribed Dose Per Fraction: 1.8 Gy
Plan Total Fractions Prescribed: 25
Plan Total Prescribed Dose: 45 Gy
Reference Point Dosage Given to Date: 36 Gy
Reference Point Session Dosage Given: 1.8 Gy
Session Number: 20

## 2023-06-21 ENCOUNTER — Ambulatory Visit
Admission: RE | Admit: 2023-06-21 | Discharge: 2023-06-21 | Disposition: A | Discharge status: Home / Self Care | Payer: 59 | Source: Ambulatory Visit | Attending: Radiation Oncology | Admitting: Radiation Oncology

## 2023-06-21 ENCOUNTER — Other Ambulatory Visit: Payer: Self-pay

## 2023-06-21 ENCOUNTER — Inpatient Hospital Stay: Payer: 59

## 2023-06-21 DIAGNOSIS — I2699 Other pulmonary embolism without acute cor pulmonale: Secondary | ICD-10-CM | POA: Diagnosis not present

## 2023-06-21 DIAGNOSIS — Z931 Gastrostomy status: Secondary | ICD-10-CM | POA: Diagnosis not present

## 2023-06-21 DIAGNOSIS — Z87891 Personal history of nicotine dependence: Secondary | ICD-10-CM | POA: Diagnosis not present

## 2023-06-21 DIAGNOSIS — Z85820 Personal history of malignant melanoma of skin: Secondary | ICD-10-CM | POA: Diagnosis not present

## 2023-06-21 DIAGNOSIS — Z9181 History of falling: Secondary | ICD-10-CM | POA: Diagnosis not present

## 2023-06-21 DIAGNOSIS — Z5111 Encounter for antineoplastic chemotherapy: Secondary | ICD-10-CM | POA: Diagnosis not present

## 2023-06-21 DIAGNOSIS — I700 Atherosclerosis of aorta: Secondary | ICD-10-CM | POA: Diagnosis not present

## 2023-06-21 DIAGNOSIS — E8809 Other disorders of plasma-protein metabolism, not elsewhere classified: Secondary | ICD-10-CM | POA: Diagnosis not present

## 2023-06-21 DIAGNOSIS — E119 Type 2 diabetes mellitus without complications: Secondary | ICD-10-CM | POA: Diagnosis not present

## 2023-06-21 DIAGNOSIS — E43 Unspecified severe protein-calorie malnutrition: Secondary | ICD-10-CM | POA: Diagnosis not present

## 2023-06-21 DIAGNOSIS — J9811 Atelectasis: Secondary | ICD-10-CM | POA: Diagnosis not present

## 2023-06-21 DIAGNOSIS — K8020 Calculus of gallbladder without cholecystitis without obstruction: Secondary | ICD-10-CM | POA: Diagnosis not present

## 2023-06-21 DIAGNOSIS — Z510 Encounter for antineoplastic radiation therapy: Secondary | ICD-10-CM | POA: Diagnosis not present

## 2023-06-21 DIAGNOSIS — K5730 Diverticulosis of large intestine without perforation or abscess without bleeding: Secondary | ICD-10-CM | POA: Diagnosis not present

## 2023-06-21 DIAGNOSIS — E860 Dehydration: Secondary | ICD-10-CM | POA: Diagnosis not present

## 2023-06-21 DIAGNOSIS — C159 Malignant neoplasm of esophagus, unspecified: Secondary | ICD-10-CM | POA: Diagnosis not present

## 2023-06-21 DIAGNOSIS — I5032 Chronic diastolic (congestive) heart failure: Secondary | ICD-10-CM | POA: Diagnosis not present

## 2023-06-21 DIAGNOSIS — Z7901 Long term (current) use of anticoagulants: Secondary | ICD-10-CM | POA: Diagnosis not present

## 2023-06-21 DIAGNOSIS — C155 Malignant neoplasm of lower third of esophagus: Secondary | ICD-10-CM | POA: Diagnosis not present

## 2023-06-21 DIAGNOSIS — Z96653 Presence of artificial knee joint, bilateral: Secondary | ICD-10-CM | POA: Diagnosis not present

## 2023-06-21 LAB — RAD ONC ARIA SESSION SUMMARY
Course Elapsed Days: 30
Plan Fractions Treated to Date: 21
Plan Prescribed Dose Per Fraction: 1.8 Gy
Plan Total Fractions Prescribed: 25
Plan Total Prescribed Dose: 45 Gy
Reference Point Dosage Given to Date: 37.8 Gy
Reference Point Session Dosage Given: 1.8 Gy
Session Number: 21

## 2023-06-22 ENCOUNTER — Ambulatory Visit
Admission: RE | Admit: 2023-06-22 | Discharge: 2023-06-22 | Disposition: A | Discharge status: Home / Self Care | Payer: 59 | Source: Ambulatory Visit | Attending: Radiation Oncology | Admitting: Radiation Oncology

## 2023-06-22 ENCOUNTER — Ambulatory Visit: Payer: 59 | Attending: Family Medicine | Admitting: Family Medicine

## 2023-06-22 ENCOUNTER — Other Ambulatory Visit: Payer: Self-pay

## 2023-06-22 ENCOUNTER — Inpatient Hospital Stay: Payer: 59

## 2023-06-22 VITALS — BP 129/73 | HR 94 | Ht 68.0 in | Wt 171.8 lb

## 2023-06-22 DIAGNOSIS — Z7901 Long term (current) use of anticoagulants: Secondary | ICD-10-CM | POA: Diagnosis not present

## 2023-06-22 DIAGNOSIS — C155 Malignant neoplasm of lower third of esophagus: Secondary | ICD-10-CM

## 2023-06-22 DIAGNOSIS — E43 Unspecified severe protein-calorie malnutrition: Secondary | ICD-10-CM | POA: Diagnosis not present

## 2023-06-22 DIAGNOSIS — H6123 Impacted cerumen, bilateral: Secondary | ICD-10-CM

## 2023-06-22 DIAGNOSIS — Z931 Gastrostomy status: Secondary | ICD-10-CM | POA: Diagnosis not present

## 2023-06-22 DIAGNOSIS — Z7984 Long term (current) use of oral hypoglycemic drugs: Secondary | ICD-10-CM | POA: Diagnosis not present

## 2023-06-22 DIAGNOSIS — I2699 Other pulmonary embolism without acute cor pulmonale: Secondary | ICD-10-CM | POA: Diagnosis not present

## 2023-06-22 DIAGNOSIS — Z510 Encounter for antineoplastic radiation therapy: Secondary | ICD-10-CM | POA: Diagnosis not present

## 2023-06-22 DIAGNOSIS — E119 Type 2 diabetes mellitus without complications: Secondary | ICD-10-CM

## 2023-06-22 DIAGNOSIS — Z5111 Encounter for antineoplastic chemotherapy: Secondary | ICD-10-CM | POA: Diagnosis not present

## 2023-06-22 LAB — RAD ONC ARIA SESSION SUMMARY
Course Elapsed Days: 31
Plan Fractions Treated to Date: 22
Plan Prescribed Dose Per Fraction: 1.8 Gy
Plan Total Fractions Prescribed: 25
Plan Total Prescribed Dose: 45 Gy
Reference Point Dosage Given to Date: 39.6 Gy
Reference Point Session Dosage Given: 1.8 Gy
Session Number: 22

## 2023-06-22 LAB — GLUCOSE, POCT (MANUAL RESULT ENTRY): POC Glucose: 219 mg/dL — AB (ref 70–99)

## 2023-06-22 MED ORDER — APIXABAN 5 MG PO TABS: 5.0000 mg | ORAL_TABLET | Freq: Two times a day (BID) | ORAL | 1 refills | Status: DC

## 2023-06-22 NOTE — Progress Notes (Signed)
Subjective:  Patient ID: Samuel [name], male    DOB: 1946/05/17  Age: 77 y.o. MRN: [id number]  CC: Medical Management of Chronic Issues (Referral to ENT)   HPI Samuel [name] is a 77 y.o. year old male with a history of bilateral knee arthroscopic surgery, fracture of distal phalanx of the middle finger of the left hand in 10/2021, type 2 diabetes mellitus (A1c 7.0), melanoma, esophageal cancer (on chemo and radiation), status post PEG tube, PE (on anticoagulation with Eliquis)  Interval History: Discussed the use of AI scribe software for clinical note transcription with the patient, who gave verbal consent to proceed.  He presents with ear wax impaction. He reports that the wax was pushed deep into his ear during a recent hospital admission, causing partial deafness. He has tried sweet oil without success and requests a referral to an ENT specialist.  Regarding his esophageal cancer, the patient reports that treatment is going well, with one more infusion and seven more radiation treatments remaining. After treatment, he will undergo further scans to assess progress. He is currently able to swallow a little and manage his hydration with Dasani water, which he finds beneficial for managing mucus production.  He is closely followed by his oncologist, radiation oncologist and cardiothoracic surgeon.  He is taking Eliquis. He requests a refill of this medication, as he only has a week's supply left.  He also has a diagnosis of diabetes, which is currently controlled at 6.4.  He was previously prescribed metformin which she was unable to tolerate due to diarrhea.  He remains on dexamethasone prescribed by oncology for his cancer.  Due to the side effects of metformin and the impact of dexamethasone on his blood sugar levels.       Past Medical History:  Diagnosis Date   Diabetes mellitus without complication (HCC)    Esophageal cancer [facility])    Pulmonary embolism (HCC)    In October  2024    Past Surgical History:  Procedure Laterality Date   BIOPSY  04/18/2023   Procedure: BIOPSY;  Surgeon: Lynann Bologna, DO;  Location: [facility] ENDOSCOPY;  Service: Gastroenterology;;   ESOPHAGOGASTRODUODENOSCOPY N/A 04/18/2023   Procedure: ESOPHAGOGASTRODUODENOSCOPY (EGD);  Surgeon: Lynann Bologna, DO;  Location: [facility] ENDOSCOPY;  Service: Gastroenterology;  Laterality: N/A;   IR GASTROSTOMY TUBE MOD SED  04/20/2023   IR NASO G TUBE PLC W/FL W/RAD  04/20/2023   KNEE ARTHROPLASTY Bilateral     Family History  Problem Relation Age of Onset   Hypertension Father    COPD Father    COPD Sister    Diabetes Maternal Grandfather     Social History   Socioeconomic History   Marital status: Married    Spouse name: Not on file   Number of children: Not on file   Years of education: Not on file   Highest education level: Not on file  Occupational History   Not on file  Tobacco Use   Smoking status: Former    Current packs/day: 0.00    Average packs/day: 1 pack/day for 15.0 years (15.0 ttl pk-yrs)    Types: Cigarettes    Start date: 06/14/1970    Quit date: 06/14/1985    Years since quitting: 38.0   Smokeless tobacco: Never  Substance and Sexual Activity   Alcohol use: Yes    Alcohol/week: 1.0 - 2.0 standard drink of alcohol    Types: 1 - 2 Cans of beer per week   Drug use: No  Sexual activity: Not Currently  Other Topics Concern   Not on file  Social History Narrative   Not on file   Social Drivers of Health   Financial Resource Strain: Low Risk  (06/02/2023)   Overall Financial Resource Strain (CARDIA)    Difficulty of Paying Living Expenses: Not very hard  Food Insecurity: No Food Insecurity (06/02/2023)   Hunger Vital Sign    Worried About Running Out of Food in the Last Year: Never true    Ran Out of Food in the Last Year: Never true  Transportation Needs: Unmet Transportation Needs (06/02/2023)   PRAPARE - Administrator, Civil Service  (Medical): Yes    Lack of Transportation (Non-Medical): Yes  Physical Activity: Sufficiently Active (08/19/2022)   Exercise Vital Sign    Days of Exercise per Week: 3 days    Minutes of Exercise per Session: 60 min  Stress: No Stress Concern Present (08/19/2022)   [employer] of Occupational Health - Occupational Stress Questionnaire    Feeling of Stress : Not at all  Social Connections: Moderately Integrated (06/02/2023)   Social Connection and Isolation Panel [NHANES]    Frequency of Communication with Friends and Family: More than three times a week    Frequency of Social Gatherings with Friends and Family: More than three times a week    Attends Religious Services: Never    Database administrator or Organizations: Yes    Attends Engineer, structural: More than 4 times per year    Marital Status: Married    Allergies  Allergen Reactions   Penicillins     Shortness of breath   Sulfa Antibiotics     Blisters    Procaine Other (See Comments)    Caused fatigue    Outpatient Medications Prior to Visit  Medication Sig Dispense Refill   dexamethasone (DECADRON) 4 MG tablet Take 2 tablets daily for 2 days, start the day after chemotherapy. Take with food.     ondansetron (ZOFRAN) 4 MG tablet Take 1 tablet (4 mg total) by mouth every 8 (eight) hours as needed for nausea or vomiting. 20 tablet 2   apixaban (ELIQUIS) 5 MG TABS tablet Place 1 tablet (5 mg total) into feeding tube 2 (two) times daily. 60 tablet 2   Blood Glucose Monitoring Suppl (ACCU-CHEK GUIDE ME) w/Device KIT USE AS DIRECTED 3 TIMES A DAY (Patient not taking: Reported on 06/22/2023) 1 kit 0   glucose blood (ACCU-CHEK GUIDE) test strip Use as instructed daily (Patient not taking: Reported on 06/22/2023) 100 each 12   prochlorperazine (COMPAZINE) 10 MG tablet Take 1 tablet (10 mg total) by mouth every 6 (six) hours as needed for nausea or vomiting. (Patient not taking: Reported on 06/22/2023)     Water For  Irrigation, Sterile (FREE WATER) SOLN Place 120 mLs into feeding tube 6 (six) times daily. (Patient not taking: Reported on 06/22/2023) 24000 mL 0   No facility-administered medications prior to visit.     ROS Review of Systems  Constitutional:  Negative for activity change and appetite change.  HENT:  Negative for sinus pressure and sore throat.   Respiratory:  Negative for chest tightness, shortness of breath and wheezing.   Cardiovascular:  Negative for chest pain and palpitations.  Gastrointestinal:  Negative for abdominal distention, abdominal pain and constipation.  Genitourinary: Negative.   Musculoskeletal: Negative.   Psychiatric/Behavioral:  Negative for behavioral problems and dysphoric mood.     Objective:  BP 129/73  Pulse 94   Ht 5\' 8"  (1.727 m)   Wt 171 lb 12.8 oz (77.9 kg)   SpO2 96%   BMI 26.12 kg/m      06/22/2023   10:29 AM 06/19/2023    1:51 PM 06/12/2023    9:19 AM  BP/Weight  Systolic BP 129 109 104  Diastolic BP 73 91 72  Wt. (Lbs) 171.8 169.5 168.2  BMI 26.12 kg/m2 25.77 kg/m2 25.57 kg/m2      Physical Exam Constitutional:      Appearance: He is well-developed.  HENT:     Right Ear: There is impacted cerumen.     Left Ear: There is impacted cerumen.  Cardiovascular:     Rate and Rhythm: Normal rate.     Heart sounds: Normal heart sounds. No murmur heard. Pulmonary:     Effort: Pulmonary effort is normal.     Breath sounds: Normal breath sounds. No wheezing or rales.  Chest:     Chest wall: No tenderness.  Abdominal:     General: Bowel sounds are normal. There is no distension.     Palpations: Abdomen is soft. There is no mass.     Tenderness: There is no abdominal tenderness.     Comments: PEG tube in place  Musculoskeletal:        General: Normal range of motion.     Right lower leg: No edema.     Left lower leg: No edema.  Neurological:     Mental Status: He is alert and oriented to person, place, and time.  Psychiatric:         Mood and Affect: Mood normal.        Latest Ref Rng & Units 06/19/2023   12:05 PM 06/12/2023    9:04 AM 06/05/2023    9:23 AM  CMP  Glucose 70 - 99 mg/dL 97  841  660   BUN 8 - 23 mg/dL 27  26  24    Creatinine 0.61 - 1.24 mg/dL 6.30  1.60  1.09   Sodium 135 - 145 mmol/L 136  137  138   Potassium 3.5 - 5.1 mmol/L 4.4  4.2  4.0   Chloride 98 - 111 mmol/L 107  105  105   CO2 22 - 32 mmol/L 24  25  26    Calcium 8.9 - 10.3 mg/dL 8.6  8.7  8.5   Total Protein 6.5 - 8.1 g/dL 5.6  5.8  5.6   Total Bilirubin <1.2 mg/dL 0.6  0.6  0.8   Alkaline Phos 38 - 126 U/L 56  63  60   AST 15 - 41 U/L 17  16  15    ALT 0 - 44 U/L 17  18  17      Lipid Panel     Component Value Date/Time   CHOL 190 12/15/2022 1054   TRIG 113 12/15/2022 1054   HDL 59 12/15/2022 1054   LDLCALC 111 (H) 12/15/2022 1054    CBC    Component Value Date/Time   WBC 1.4 (L) 06/19/2023 1205   WBC 8.0 05/24/2023 0535   RBC 4.30 06/19/2023 1205   HGB 13.2 06/19/2023 1205   HGB 15.4 08/09/2022 1425   HCT 39.4 06/19/2023 1205   HCT 46.0 08/09/2022 1425   PLT 127 (L) 06/19/2023 1205   PLT 281 08/09/2022 1425   MCV 91.6 06/19/2023 1205   MCV 87 08/09/2022 1425   MCH 30.7 06/19/2023 1205   MCHC 33.5 06/19/2023 1205  RDW 16.3 (H) 06/19/2023 1205   RDW 12.8 08/09/2022 1425   LYMPHSABS 0.2 (L) 06/19/2023 1205   LYMPHSABS 2.8 08/09/2022 1425   MONOABS 0.2 06/19/2023 1205   EOSABS 0.0 06/19/2023 1205   EOSABS 0.4 08/09/2022 1425   BASOSABS 0.0 06/19/2023 1205   BASOSABS 0.1 08/09/2022 1425    Lab Results  Component Value Date   HGBA1C 6.4 (H) 05/23/2023    Assessment & Plan:      Esophageal Cancer Undergoing chemotherapy and radiation therapy with one more infusion and seven more radiation treatments remaining. Plans for further imaging after inflammation subsides to assess treatment response. -Continue current treatment plan as directed by oncology team.  Impacted Cerumen Reports difficulty hearing  due to impacted ear wax. -Referral to ENT for cerumen removal.  Pulmonary Embolism Currently on Eliquis for a diagnosed pulmonary embolism in the setting of malignancy. Duration of treatment unclear. -Send one month supply of Eli[facility] to [facility] on [place]. -Discuss duration of Eliquis treatment with oncologist at next visit.  Type II Diabetes Mellitus Diet controlled with A1c of 6.4 Reports difficulty with metformin due to diarrhea. Currently on dexamethasone which could potentially cause elevated blood glucose levels. Blood glucose monitoring kit broken. -Check current blood glucose level. -Provide new blood glucose monitoring kit. -Consider alternative diabetes medication if blood glucose levels are elevated. -Consider initiation of statin at next visit.  PEG Tube All medications currently administered via PEG tube due to difficulty swallowing. -Continue current medication administration via PEG tube.  General Health Maintenance -Continue monitoring blood glucose levels while on dexamethasone. -Follow-up with oncology team regarding duration of Eliquis treatment and plans for further imaging.          Meds ordered this encounter  Medications   apixaban (ELIQUIS) 5 MG TABS tablet    Sig: Place 1 tablet (5 mg total) into feeding tube 2 (two) times daily.    Dispense:  60 tablet    Refill:  1    Follow-up: Return in about 3 months (around 09/20/2023) for Coordination of care.       [name], MD, FAAFP. [facility] [place], [place] [phone]   06/22/2023, 1:20 PM

## 2023-06-22 NOTE — Patient Instructions (Signed)
VISIT SUMMARY:  During today's visit, we addressed several of your ongoing health concerns, including your esophageal cancer treatment, ear wax impaction, pulmonary embolism, and diabetes management. We also discussed the administration of your medications via your PEG tube and general health maintenance.  YOUR PLAN:  -ESOPHAGEAL CANCER: Esophageal cancer is a type of cancer that occurs in the esophagus, the tube that carries food from your mouth to your stomach. You are currently undergoing chemotherapy and radiation therapy, with one more infusion and seven more radiation treatments remaining. We will plan for further imaging after the inflammation subsides to assess your treatment response. Please continue with your current treatment plan as directed by your oncology team.  -IMPACTED CERUMEN: Impacted cerumen, or ear wax impaction, occurs when ear wax builds up in the ear canal, causing hearing difficulties. We will refer you to an ENT specialist for cerumen removal to help alleviate your hearing issues.  -PULMONARY EMBOLISM: A pulmonary embolism is a blood clot that blocks blood flow to the lungs. You are currently taking Eliquis to manage this condition. We will send a one-month supply of Eliquis to your pharmacy. Please discuss the duration of your Eliquis treatment with your oncologist at your next visit.  -DIABETES MELLITUS: Diabetes mellitus is a condition that affects how your body processes blood sugar. Your diabetes is currently uncontrolled due to the side effects of metformin and the impact of dexamethasone on your blood sugar levels. We will check your current blood glucose level, provide you with a new blood glucose monitoring kit, and consider alternative diabetes medications if your blood glucose levels are elevated.  -PEG TUBE: A PEG tube is a feeding tube placed in your stomach to help with nutrition and medication administration. You should continue administering all your  medications via the PEG tube as you have been doing.  -GENERAL HEALTH MAINTENANCE: Continue monitoring your blood glucose levels while on dexamethasone. Follow up with your oncology team regarding the duration of your Eliquis treatment and plans for further imaging.  INSTRUCTIONS:  Please follow up with your oncology team to discuss the duration of your Eliquis treatment and plans for further imaging after your esophageal cancer treatment. Additionally, attend the ENT specialist appointment for ear wax removal. Continue monitoring your blood glucose levels and use the new blood glucose monitoring kit provided. If you experience any issues or have questions, do not hesitate to contact our office.

## 2023-06-23 ENCOUNTER — Inpatient Hospital Stay: Payer: 59

## 2023-06-23 ENCOUNTER — Encounter: Payer: Self-pay | Admitting: Hematology

## 2023-06-23 ENCOUNTER — Other Ambulatory Visit: Payer: Self-pay

## 2023-06-23 ENCOUNTER — Ambulatory Visit
Admission: RE | Admit: 2023-06-23 | Discharge: 2023-06-23 | Disposition: A | Discharge status: Home / Self Care | Payer: 59 | Source: Ambulatory Visit | Attending: Radiation Oncology | Admitting: Radiation Oncology

## 2023-06-23 ENCOUNTER — Ambulatory Visit: Payer: 59 | Admitting: Thoracic Surgery (Cardiothoracic Vascular Surgery)

## 2023-06-23 DIAGNOSIS — Z510 Encounter for antineoplastic radiation therapy: Secondary | ICD-10-CM | POA: Diagnosis not present

## 2023-06-23 DIAGNOSIS — Z7901 Long term (current) use of anticoagulants: Secondary | ICD-10-CM | POA: Diagnosis not present

## 2023-06-23 DIAGNOSIS — C155 Malignant neoplasm of lower third of esophagus: Secondary | ICD-10-CM

## 2023-06-23 DIAGNOSIS — Z5111 Encounter for antineoplastic chemotherapy: Secondary | ICD-10-CM | POA: Diagnosis not present

## 2023-06-23 DIAGNOSIS — Z931 Gastrostomy status: Secondary | ICD-10-CM | POA: Diagnosis not present

## 2023-06-23 DIAGNOSIS — E43 Unspecified severe protein-calorie malnutrition: Secondary | ICD-10-CM | POA: Diagnosis not present

## 2023-06-23 LAB — RAD ONC ARIA SESSION SUMMARY
Course Elapsed Days: 32
Plan Fractions Treated to Date: 23
Plan Prescribed Dose Per Fraction: 1.8 Gy
Plan Total Fractions Prescribed: 25
Plan Total Prescribed Dose: 45 Gy
Reference Point Dosage Given to Date: 41.4 Gy
Reference Point Session Dosage Given: 1.8 Gy
Session Number: 23

## 2023-06-23 NOTE — Progress Notes (Signed)
     [address]       [place], [place] [address]             [phone]       Patient: Samuel [name] Provider: Office Consent for Telemedicine visit obtained.  Today's visit was completed via a real-time telehealth (see specific modality noted below). The patient/authorized person provided oral consent at the time of the visit to engage in a telemedicine encounter with the present provider at [facility]. The patient/authorized person was informed of the potential benefits, limitations, and risks of telemedicine. The patient/authorized person expressed understanding that the laws that protect confidentiality also apply to telemedicine. The patient/authorized person acknowledged understanding that telemedicine does not provide emergency services and that he or she would need to call 911 or proceed to the nearest hospital for help if such a need arose.   Total time spent in the clinical discussion 10 minutes.  Telehealth Modality: Phone visit (audio only)  I had a telephone visit with Samuel [name]    Current Weight: 171  Currently tolerating chemorads and tubefeeds via PEG tube.

## 2023-06-25 ENCOUNTER — Encounter: Payer: Self-pay | Admitting: Hematology

## 2023-06-25 ENCOUNTER — Encounter: Payer: Self-pay | Admitting: Nurse Practitioner

## 2023-06-25 ENCOUNTER — Other Ambulatory Visit: Payer: Self-pay | Admitting: Family Medicine

## 2023-06-25 NOTE — Progress Notes (Unsigned)
Patient Care Team: [name], MD as PCP - General (Family Medicine) [name], MD as PCP - Cardiology (Cardiology) [name], MD as Consulting Physician (Hematology and Oncology)  Clinic Day:  06/26/2023  Referring physician: [name], MD  ASSESSMENT & PLAN:   Assessment & Plan: Malignant neoplasm of esophagus (HCC) -cTxN0M0 -Patient presented to hospital with progressive dysphagia and weight loss. -EGD showed a malignant appearing severe stenosis 30 to 34 cm from incisors.  The stenosis measured 4 cm, the scope was not able to pass.  Biopsy confirmed adenocarcinoma -He underwent PEG feeding tube for nutrition -PET scan showed hypermetabolic lesion in the distal esophagus, no nodal or distant metastasis.   -pt was evaluated by cardiothoracic surgeon Dr. Cliffton Asters, he is not a good candidate for surgery, but will re-evaluate him after chemoradiation -He started concurrent chemoradiation with weekly carbo platinum and Taxol on 05/22/23, he is tolerating moderately well overall  -06/26/2023 -today is cycle 1 day 36. This is to be final chemotherapy treatment. Final radiation appointment scheduled for 07/03/2023. Will see me back after labs and final radiation appointment.    Plan: Labs reviewed  -CBC showing WBC 1.4; Hgb 12.5; Hct 36.4; Plt 215; Anc 0.9 -CMP - K 4.1; glucose 148; BUN 27; Creatinine 0.78; eGFR > 60; Ca 8.4; LFT normal.   Suggested addition of Tums OTC to help with symptoms of esophagitis.  Use per package instructions. Labs essentially stable.  Discussed with Dr. Shirline Frees and patient.  Okay to proceed with treatment.  Today is cycle 1 day 36.  Today is to be a last treatment with chemotherapy. Last radiation treatment scheduled for next Monday, 07/03/2023. Follow-up with labs in 1 week. PET scan ordered for last week of January to evaluate response to treatment.  Will then follow-up with Dr. Cliffton Asters for surgical evaluation.  The patient understands the  plans discussed today and is in agreement with them.  He knows to contact our office if he develops concerns prior to his next appointment.  I provided 25 minutes of face-to-face time during this encounter and > 50% was spent counseling as documented under my assessment and plan.    Carlean Jews, NP  [facility] - A DEPT OF [facility] [address] [place] [address] Dept: [phone] Dept Fax: [phone]   Orders Placed This Encounter  Procedures   NM PET Image Initial (PI) Skull Base To Thigh    Standing Status:   Future    Expected Date:   08/03/2023    Expiration Date:   06/25/2024    If indicated for the ordered procedure, I authorize the administration of a radiopharmaceutical per Radiology protocol:   Yes    Preferred imaging location?:   [facility]      CHIEF COMPLAINT:  CC: Esophageal cancer  Current Treatment: Concurrent chemoradiation with weekly carboplatin and Taxol.  INTERVAL HISTORY:  Nahjee is here today for repeat clinical assessment.  He was last seen by myself on 06/19/2023.  States he has spoken with Dr. Alvis Lemmings who is his primary care provider.  She would like him restarted on Eliquis.  Prescription has already been picked up for patient.  Dr. Alvis Lemmings has referred him to ENT to have ears cleaned out.  Patient reports still being able to have very small sips of ice cold water.  He is able to belch and pass gas.  Still unable to eat foods by  mouth.  Relying on tube feeds for nutrition.  Feels like he has developed some esophagitis. He gets a burning sensation in epigastric area and chest after administering tube feeds. He denies fevers or chills. He denies pain.  His weight has increased 6 pounds over last week.  I have reviewed the past medical history, past surgical history, social history and family history with the patient and they are unchanged from previous note.  ALLERGIES:  is  allergic to penicillins, sulfa antibiotics, and procaine.  MEDICATIONS:  Current Outpatient Medications  Medication Sig Dispense Refill   apixaban (ELIQUIS) 5 MG TABS tablet Place 1 tablet (5 mg total) into feeding tube 2 (two) times daily. 60 tablet 1   Blood Glucose Monitoring Suppl (ACCU-CHEK GUIDE ME) w/Device KIT USE AS DIRECTED 3 TIMES A DAY 1 kit 0   dexamethasone (DECADRON) 4 MG tablet Take 2 tablets daily for 2 days, start the day after chemotherapy. Take with food.     glucose blood (ACCU-CHEK GUIDE) test strip Use as instructed daily 100 each 12   ondansetron (ZOFRAN) 4 MG tablet Take 1 tablet (4 mg total) by mouth every 8 (eight) hours as needed for nausea or vomiting. 20 tablet 2   prochlorperazine (COMPAZINE) 10 MG tablet Take 1 tablet (10 mg total) by mouth every 6 (six) hours as needed for nausea or vomiting.     Water For Irrigation, Sterile (FREE WATER) SOLN Place 120 mLs into feeding tube 6 (six) times daily. 24000 mL 0   No current facility-administered medications for this visit.    HISTORY OF PRESENT ILLNESS:   Oncology History  Malignant neoplasm of esophagus (HCC)  04/18/2023 Cancer Staging   Staging form: Esophagus - Adenocarcinoma, AJCC 8th Edition - Clinical stage from 04/18/2023: Stage Unknown (cTX, cN0, cM0) - Signed by [name], MD on 05/04/2023 Total positive nodes: 0   04/20/2023 Initial Diagnosis   Malignant neoplasm of lower third of esophagus (HCC)   05/22/2023 -  Chemotherapy   Patient is on Treatment Plan : ESOPHAGUS Carboplatin + Paclitaxel Weekly X 6 Weeks with XRT         REVIEW OF SYSTEMS:   Constitutional: Denies fevers, chills or abnormal weight loss Eyes: Denies blurriness of vision Ears, nose, mouth, throat, and face: Denies mucositis or sore throat. Ears feel plugged. Has been referred to ENT by his primary care to have ears looked at and cleaned out.  Respiratory: Denies cough, dyspnea or wheezes Cardiovascular: Denies  palpitation, chest discomfort or lower extremity swelling Gastrointestinal:  Denies nausea or changes in bowel habits. Has developed some esophagitis/GERD related to tube feedings.  Skin: Denies abnormal skin rashes Lymphatics: Denies new lymphadenopathy or easy bruising Neurological:Denies numbness, tingling or new weaknesses Behavioral/Psych: Mood is stable, no new changes  All other systems were reviewed with the patient and are negative.   VITALS:   Today's Vitals   06/26/23 0857 06/26/23 0858  BP: 120/78   Pulse: 99   Resp: 16   Temp: (!) 97.1 F (36.2 C)   TempSrc: Temporal   SpO2: 96%   Weight: 177 lb 3.2 oz (80.4 kg)   PainSc:  0-No pain   Body mass index is 26.94 kg/m.   Wt Readings from Last 3 Encounters:  06/26/23 177 lb 3.2 oz (80.4 kg)  06/22/23 171 lb 12.8 oz (77.9 kg)  06/19/23 169 lb 8 oz (76.9 kg)    Body mass index is 26.94 kg/m.  Performance status (ECOG): 1 - Symptomatic but  completely ambulatory  PHYSICAL EXAM:   GENERAL:alert, no distress and comfortable SKIN: skin color, texture, turgor are normal, no rashes or significant lesions EYES: normal, Conjunctiva are pink and non-injected, sclera clear OROPHARYNX:no exudate, no erythema and lips, buccal mucosa, and tongue normal  NECK: supple, thyroid normal size, non-tender, without nodularity LYMPH:  no palpable lymphadenopathy in the cervical, axillary or inguinal LUNGS: clear to auscultation and percussion with normal breathing effort HEART: regular rate & rhythm and no murmurs and no lower extremity edema ABDOMEN:abdomen soft, non-tender and normal bowel sounds. G-tube intact.  Musculoskeletal:no cyanosis of digits and no clubbing  NEURO: alert & oriented x 3 with fluent speech, no focal motor/sensory deficits  LABORATORY DATA:  I have reviewed the data as listed    Component Value Date/Time   NA 137 06/26/2023 0829   NA 144 08/09/2022 1425   K 4.1 06/26/2023 0829   CL 105 06/26/2023 0829    CO2 27 06/26/2023 0829   GLUCOSE 148 (H) 06/26/2023 0829   BUN 27 (H) 06/26/2023 0829   BUN 15 08/09/2022 1425   CREATININE 0.73 06/26/2023 0829   CALCIUM 8.4 (L) 06/26/2023 0829   PROT 5.3 (L) 06/26/2023 0829   PROT 6.5 08/09/2022 1425   ALBUMIN 3.2 (L) 06/26/2023 0829   ALBUMIN 4.2 08/09/2022 1425   AST 17 06/26/2023 0829   ALT 17 06/26/2023 0829   ALKPHOS 56 06/26/2023 0829   BILITOT 0.6 06/26/2023 0829   GFRNONAA >60 06/26/2023 0829   GFRAA 83 06/15/2015 0941    Lab Results  Component Value Date   WBC 1.4 (L) 06/26/2023   NEUTROABS 0.9 (L) 06/26/2023   HGB 12.5 (L) 06/26/2023   HCT 36.4 (L) 06/26/2023   MCV 90.3 06/26/2023   PLT 215 06/26/2023

## 2023-06-25 NOTE — Assessment & Plan Note (Signed)
-  cTxN0M0 -Patient presented to hospital with progressive dysphagia and weight loss. -EGD showed a malignant appearing severe stenosis 30 to 34 cm from incisors.  The stenosis measured 4 cm, the scope was not able to pass.  Biopsy confirmed adenocarcinoma -He underwent PEG feeding tube for nutrition -PET scan showed hypermetabolic lesion in the distal esophagus, no nodal or distant metastasis.   -pt was evaluated by cardiothoracic surgeon Dr. Cliffton Asters, he is not a good candidate for surgery, but will re-evaluate him after chemoradiation -He started concurrent chemoradiation with weekly carbo platinum and Taxol on 05/22/23, he is tolerating moderately well overall  -06/26/2023 -today is cycle 1 day 36.

## 2023-06-26 ENCOUNTER — Inpatient Hospital Stay: Payer: 59

## 2023-06-26 ENCOUNTER — Other Ambulatory Visit: Payer: Self-pay

## 2023-06-26 ENCOUNTER — Inpatient Hospital Stay (HOSPITAL_BASED_OUTPATIENT_CLINIC_OR_DEPARTMENT_OTHER): Payer: 59 | Admitting: Nurse Practitioner

## 2023-06-26 ENCOUNTER — Ambulatory Visit
Admission: RE | Admit: 2023-06-26 | Discharge: 2023-06-26 | Disposition: A | Discharge status: Home / Self Care | Payer: 59 | Source: Ambulatory Visit | Attending: Radiation Oncology

## 2023-06-26 ENCOUNTER — Encounter: Payer: Self-pay | Admitting: Nurse Practitioner

## 2023-06-26 VITALS — BP 120/78 | HR 99 | Temp 97.1°F | Resp 16 | Wt 177.2 lb

## 2023-06-26 DIAGNOSIS — Z5111 Encounter for antineoplastic chemotherapy: Secondary | ICD-10-CM | POA: Diagnosis not present

## 2023-06-26 DIAGNOSIS — C159 Malignant neoplasm of esophagus, unspecified: Secondary | ICD-10-CM

## 2023-06-26 DIAGNOSIS — E43 Unspecified severe protein-calorie malnutrition: Secondary | ICD-10-CM | POA: Diagnosis not present

## 2023-06-26 DIAGNOSIS — C155 Malignant neoplasm of lower third of esophagus: Secondary | ICD-10-CM | POA: Diagnosis not present

## 2023-06-26 DIAGNOSIS — Z510 Encounter for antineoplastic radiation therapy: Secondary | ICD-10-CM | POA: Diagnosis not present

## 2023-06-26 DIAGNOSIS — Z931 Gastrostomy status: Secondary | ICD-10-CM | POA: Diagnosis not present

## 2023-06-26 DIAGNOSIS — Z7901 Long term (current) use of anticoagulants: Secondary | ICD-10-CM | POA: Diagnosis not present

## 2023-06-26 LAB — RAD ONC ARIA SESSION SUMMARY
Course Elapsed Days: 35
Plan Fractions Treated to Date: 24
Plan Prescribed Dose Per Fraction: 1.8 Gy
Plan Total Fractions Prescribed: 25
Plan Total Prescribed Dose: 45 Gy
Reference Point Dosage Given to Date: 43.2 Gy
Reference Point Session Dosage Given: 1.8 Gy
Session Number: 24

## 2023-06-26 LAB — CMP (CANCER CENTER ONLY)
ALT: 17 U/L (ref 0–44)
AST: 17 U/L (ref 15–41)
Albumin: 3.2 g/dL — ABNORMAL LOW (ref 3.5–5.0)
Alkaline Phosphatase: 56 U/L (ref 38–126)
Anion gap: 5 (ref 5–15)
BUN: 27 mg/dL — ABNORMAL HIGH (ref 8–23)
CO2: 27 mmol/L (ref 22–32)
Calcium: 8.4 mg/dL — ABNORMAL LOW (ref 8.9–10.3)
Chloride: 105 mmol/L (ref 98–111)
Creatinine: 0.73 mg/dL (ref 0.61–1.24)
GFR, Estimated: >60 mL/min (ref >60)
Glucose, Bld: 148 mg/dL — ABNORMAL HIGH (ref 70–99)
Potassium: 4.1 mmol/L (ref 3.5–5.1)
Sodium: 137 mmol/L (ref 135–145)
Total Bilirubin: 0.6 mg/dL (ref <1.2)
Total Protein: 5.3 g/dL — ABNORMAL LOW (ref 6.5–8.1)

## 2023-06-26 LAB — CBC WITH DIFFERENTIAL (CANCER CENTER ONLY)
Abs Immature Granulocytes: 0.07 10*3/uL (ref 0.00–0.07)
Basophils Absolute: 0 10*3/uL (ref 0.0–0.1)
Basophils Relative: 1 %
Eosinophils Absolute: 0 10*3/uL (ref 0.0–0.5)
Eosinophils Relative: 0 %
HCT: 36.4 % — ABNORMAL LOW (ref 39.0–52.0)
Hemoglobin: 12.5 g/dL — ABNORMAL LOW (ref 13.0–17.0)
Immature Granulocytes: 5 %
Lymphocytes Relative: 14 %
Lymphs Abs: 0.2 10*3/uL — ABNORMAL LOW (ref 0.7–4.0)
MCH: 31 pg (ref 26.0–34.0)
MCHC: 34.3 g/dL (ref 30.0–36.0)
MCV: 90.3 fL (ref 80.0–100.0)
Monocytes Absolute: 0.2 10*3/uL (ref 0.1–1.0)
Monocytes Relative: 15 %
Neutro Abs: 0.9 10*3/uL — ABNORMAL LOW (ref 1.7–7.7)
Neutrophils Relative %: 65 %
Platelet Count: 215 10*3/uL (ref 150–400)
RBC: 4.03 MIL/uL — ABNORMAL LOW (ref 4.22–5.81)
RDW: 16 % — ABNORMAL HIGH (ref 11.5–15.5)
WBC Count: 1.4 10*3/uL — ABNORMAL LOW (ref 4.0–10.5)
nRBC: 1.4 % — ABNORMAL HIGH (ref 0.0–0.2)

## 2023-06-26 MED ORDER — SODIUM CHLORIDE 0.9 % IV SOLN: INTRAVENOUS | Status: DC

## 2023-06-26 MED ORDER — PALONOSETRON HCL INJECTION 0.25 MG/5ML
0.2500 mg | Freq: Once | INTRAVENOUS | Status: AC
Start: 2023-06-26 — End: 2023-06-26
  Administered 2023-06-26: 0.25 mg via INTRAVENOUS
  Filled 2023-06-26: qty 5

## 2023-06-26 MED ORDER — FAMOTIDINE IN NACL 20-0.9 MG/50ML-% IV SOLN
20.0000 mg | Freq: Once | INTRAVENOUS | Status: AC
  Administered 2023-06-26: 20 mg via INTRAVENOUS
  Filled 2023-06-26: qty 50

## 2023-06-26 MED ORDER — CARBOPLATIN CHEMO INJECTION 450 MG/45ML
184.2000 mg | Freq: Once | INTRAVENOUS | Status: AC
  Administered 2023-06-26: 180 mg via INTRAVENOUS
  Filled 2023-06-26: qty 18

## 2023-06-26 MED ORDER — SODIUM CHLORIDE 0.9 % IV SOLN
50.0000 mg/m2 | Freq: Once | INTRAVENOUS | Status: AC
  Administered 2023-06-26: 96 mg via INTRAVENOUS
  Filled 2023-06-26: qty 16

## 2023-06-26 MED ORDER — CETIRIZINE HCL 10 MG/ML IV SOLN
10.0000 mg | Freq: Once | INTRAVENOUS | Status: AC
Start: 2023-06-26 — End: 2023-06-26
  Administered 2023-06-26: 10 mg via INTRAVENOUS
  Filled 2023-06-26: qty 1

## 2023-06-26 MED ORDER — DEXAMETHASONE SODIUM PHOSPHATE 10 MG/ML IJ SOLN
10.0000 mg | Freq: Once | INTRAMUSCULAR | Status: AC
  Administered 2023-06-26: 10 mg via INTRAVENOUS
  Filled 2023-06-26: qty 1

## 2023-06-26 NOTE — Patient Instructions (Signed)
 [facility] - A DEPT OF [facility]  Discharge Instructions: Thank you for choosing [facility] to provide your oncology and hematology care.   If you have a lab appointment with the Cancer Center, please go directly to the Cancer Center and check in at the registration area.   Wear comfortable clothing and clothing appropriate for easy access to any Portacath or PICC line.   We strive to give you quality time with your provider. You may need to reschedule your appointment if you arrive late (15 or more minutes).  Arriving late affects you and other patients whose appointments are after yours.  Also, if you miss three or more appointments without notifying the office, you may be dismissed from the clinic at the provider's discretion.      For prescription refill requests, have your pharmacy contact our office and allow 72 hours for refills to be completed.    Today you received the following chemotherapy and/or immunotherapy agents: Paclitaxel & Carboplatin      To help prevent nausea and vomiting after your treatment, we encourage you to take your nausea medication as directed.  BELOW ARE SYMPTOMS THAT SHOULD BE REPORTED IMMEDIATELY: *FEVER GREATER THAN 100.4 F (38 C) OR HIGHER *CHILLS OR SWEATING *NAUSEA AND VOMITING THAT IS NOT CONTROLLED WITH YOUR NAUSEA MEDICATION *UNUSUAL SHORTNESS OF BREATH *UNUSUAL BRUISING OR BLEEDING *URINARY PROBLEMS (pain or burning when urinating, or frequent urination) *BOWEL PROBLEMS (unusual diarrhea, constipation, pain near the anus) TENDERNESS IN MOUTH AND THROAT WITH OR WITHOUT PRESENCE OF ULCERS (sore throat, sores in mouth, or a toothache) UNUSUAL RASH, SWELLING OR PAIN  UNUSUAL VAGINAL DISCHARGE OR ITCHING   Items with * indicate a potential emergency and should be followed up as soon as possible or go to the Emergency Department if any problems should occur.  Please show the CHEMOTHERAPY ALERT CARD or  IMMUNOTHERAPY ALERT CARD at check-in to the Emergency Department and triage nurse.  Should you have questions after your visit or need to cancel or reschedule your appointment, please contact [facility] - A DEPT OF [facility]  Dept: [phone]  and follow the prompts.  Office hours are 8:00 a.m. to 4:30 p.m. Monday - Friday. Please note that voicemails left after 4:00 p.m. may not be returned until the following business day.  We are closed weekends and major holidays. You have access to a nurse at all times for urgent questions. Please call the main number to the clinic Dept: [phone] and follow the prompts.   For any non-urgent questions, you may also contact your provider using MyChart. We now offer e-Visits for anyone 61 and older to request care online for non-urgent symptoms. For details visit [email or web address].   Also download the MyChart app! Go to the app store, search "MyChart", open the app, select [place], and log in with your MyChart username and password.

## 2023-06-26 NOTE — Progress Notes (Signed)
Nutrition Follow-up:  Patient with esophageal cancer, s/p PEG tube placement.  Receiving final chemo today and final radiation on 12/30.    Met with patient during infusion.  Reports that his urine is pale yellow.  Tolerating 6 cartons of osmolite 1.5 via feeding tube (2 cartons 3 times a day). Using 30ml of prosource TID via tube.   Flushing tube with about 2-2 1/2 liters of water daily (with medications as well).  Using Tums for esophagitis.  Reports constipation but had bowel movement yesterday which helped to relieve stomach pains.      Medications: reviewed  Labs: reviewed  Anthropometrics:   Weight 177 lb 3.2 oz today (was not wearing jacket)  171 lb 12.8 oz on 12/19 168 lb on 12/9   Estimated Energy Needs  Kcals: 2100-2300 Protein: 110-125 g Fluid: 2.2 L  NUTRITION DIAGNOSIS: Severe malnutrition improving    INTERVENTION:  Continue 6  cartons of osmolite 1.5 and 30ml of prosource TID via feeding tube. Continue 2 L water flush via tube. Provides 2430 calories, 119.4 g protein, 3080 ml free water.   Patient has adequate enteral supplies from [employer].     MONITORING, EVALUATION, GOAL: weight trends, tube feeding tolerance   NEXT VISIT: as needed  Shanzay Hepworth B. Freida Busman, RD, LDN Registered Dietitian [phone]

## 2023-06-27 ENCOUNTER — Other Ambulatory Visit: Payer: Self-pay

## 2023-06-27 ENCOUNTER — Ambulatory Visit
Admission: RE | Admit: 2023-06-27 | Discharge: 2023-06-27 | Disposition: A | Discharge status: Home / Self Care | Payer: 59 | Source: Ambulatory Visit | Attending: Radiation Oncology | Admitting: Radiation Oncology

## 2023-06-27 ENCOUNTER — Inpatient Hospital Stay: Payer: 59

## 2023-06-27 DIAGNOSIS — Z931 Gastrostomy status: Secondary | ICD-10-CM | POA: Diagnosis not present

## 2023-06-27 DIAGNOSIS — Z5111 Encounter for antineoplastic chemotherapy: Secondary | ICD-10-CM | POA: Diagnosis not present

## 2023-06-27 DIAGNOSIS — C155 Malignant neoplasm of lower third of esophagus: Secondary | ICD-10-CM | POA: Diagnosis not present

## 2023-06-27 DIAGNOSIS — Z7901 Long term (current) use of anticoagulants: Secondary | ICD-10-CM | POA: Diagnosis not present

## 2023-06-27 DIAGNOSIS — Z510 Encounter for antineoplastic radiation therapy: Secondary | ICD-10-CM | POA: Diagnosis not present

## 2023-06-27 DIAGNOSIS — E43 Unspecified severe protein-calorie malnutrition: Secondary | ICD-10-CM | POA: Diagnosis not present

## 2023-06-27 LAB — RAD ONC ARIA SESSION SUMMARY
Course Elapsed Days: 36
Plan Fractions Treated to Date: 25
Plan Prescribed Dose Per Fraction: 1.8 Gy
Plan Total Fractions Prescribed: 25
Plan Total Prescribed Dose: 45 Gy
Reference Point Dosage Given to Date: 45 Gy
Reference Point Session Dosage Given: 1.8 Gy
Session Number: 25

## 2023-06-29 ENCOUNTER — Inpatient Hospital Stay: Payer: 59

## 2023-06-29 ENCOUNTER — Other Ambulatory Visit: Payer: Self-pay

## 2023-06-29 ENCOUNTER — Ambulatory Visit
Admission: RE | Admit: 2023-06-29 | Discharge: 2023-06-29 | Disposition: A | Discharge status: Home / Self Care | Payer: 59 | Source: Ambulatory Visit | Attending: Radiation Oncology | Admitting: Radiation Oncology

## 2023-06-29 DIAGNOSIS — Z7901 Long term (current) use of anticoagulants: Secondary | ICD-10-CM | POA: Diagnosis not present

## 2023-06-29 DIAGNOSIS — Z5111 Encounter for antineoplastic chemotherapy: Secondary | ICD-10-CM | POA: Diagnosis not present

## 2023-06-29 DIAGNOSIS — Z931 Gastrostomy status: Secondary | ICD-10-CM | POA: Diagnosis not present

## 2023-06-29 DIAGNOSIS — E43 Unspecified severe protein-calorie malnutrition: Secondary | ICD-10-CM | POA: Diagnosis not present

## 2023-06-29 DIAGNOSIS — Z510 Encounter for antineoplastic radiation therapy: Secondary | ICD-10-CM | POA: Diagnosis not present

## 2023-06-29 DIAGNOSIS — C155 Malignant neoplasm of lower third of esophagus: Secondary | ICD-10-CM | POA: Diagnosis not present

## 2023-06-29 LAB — RAD ONC ARIA SESSION SUMMARY
Course Elapsed Days: 38
Plan Fractions Treated to Date: 1
Plan Prescribed Dose Per Fraction: 1.8 Gy
Plan Total Fractions Prescribed: 3
Plan Total Prescribed Dose: 5.4 Gy
Reference Point Dosage Given to Date: 1.8 Gy
Reference Point Session Dosage Given: 1.8 Gy
Session Number: 26

## 2023-06-30 ENCOUNTER — Ambulatory Visit
Admission: RE | Admit: 2023-06-30 | Discharge: 2023-06-30 | Disposition: A | Discharge status: Home / Self Care | Payer: 59 | Source: Ambulatory Visit | Attending: Radiation Oncology | Admitting: Radiation Oncology

## 2023-06-30 ENCOUNTER — Inpatient Hospital Stay: Payer: 59

## 2023-06-30 ENCOUNTER — Ambulatory Visit
Admission: RE | Admit: 2023-06-30 | Discharge: 2023-06-30 | Disposition: A | Discharge status: Home / Self Care | Payer: 59 | Source: Ambulatory Visit | Attending: Radiation Oncology

## 2023-06-30 ENCOUNTER — Other Ambulatory Visit: Payer: Self-pay

## 2023-06-30 DIAGNOSIS — Z510 Encounter for antineoplastic radiation therapy: Secondary | ICD-10-CM | POA: Diagnosis not present

## 2023-06-30 DIAGNOSIS — Z5111 Encounter for antineoplastic chemotherapy: Secondary | ICD-10-CM | POA: Diagnosis not present

## 2023-06-30 DIAGNOSIS — Z7901 Long term (current) use of anticoagulants: Secondary | ICD-10-CM | POA: Diagnosis not present

## 2023-06-30 DIAGNOSIS — Z931 Gastrostomy status: Secondary | ICD-10-CM | POA: Diagnosis not present

## 2023-06-30 DIAGNOSIS — C155 Malignant neoplasm of lower third of esophagus: Secondary | ICD-10-CM | POA: Diagnosis not present

## 2023-06-30 DIAGNOSIS — E43 Unspecified severe protein-calorie malnutrition: Secondary | ICD-10-CM | POA: Diagnosis not present

## 2023-06-30 LAB — RAD ONC ARIA SESSION SUMMARY
Course Elapsed Days: 39
Plan Fractions Treated to Date: 2
Plan Prescribed Dose Per Fraction: 1.8 Gy
Plan Total Fractions Prescribed: 3
Plan Total Prescribed Dose: 5.4 Gy
Reference Point Dosage Given to Date: 3.6 Gy
Reference Point Session Dosage Given: 1.8 Gy
Session Number: 27

## 2023-07-03 ENCOUNTER — Inpatient Hospital Stay: Payer: 59

## 2023-07-03 ENCOUNTER — Other Ambulatory Visit: Payer: Self-pay

## 2023-07-03 ENCOUNTER — Ambulatory Visit
Admission: RE | Admit: 2023-07-03 | Discharge: 2023-07-03 | Disposition: A | Discharge status: Home / Self Care | Payer: 59 | Source: Ambulatory Visit | Attending: Radiation Oncology | Admitting: Radiation Oncology

## 2023-07-03 ENCOUNTER — Inpatient Hospital Stay (HOSPITAL_BASED_OUTPATIENT_CLINIC_OR_DEPARTMENT_OTHER): Payer: 59 | Admitting: Nurse Practitioner

## 2023-07-03 VITALS — BP 104/74 | HR 92 | Temp 98.0°F | Resp 15 | Wt 173.5 lb

## 2023-07-03 DIAGNOSIS — C155 Malignant neoplasm of lower third of esophagus: Secondary | ICD-10-CM

## 2023-07-03 DIAGNOSIS — Z5111 Encounter for antineoplastic chemotherapy: Secondary | ICD-10-CM | POA: Diagnosis not present

## 2023-07-03 DIAGNOSIS — E43 Unspecified severe protein-calorie malnutrition: Secondary | ICD-10-CM | POA: Diagnosis not present

## 2023-07-03 DIAGNOSIS — Z7901 Long term (current) use of anticoagulants: Secondary | ICD-10-CM | POA: Diagnosis not present

## 2023-07-03 DIAGNOSIS — Z95828 Presence of other vascular implants and grafts: Secondary | ICD-10-CM | POA: Insufficient documentation

## 2023-07-03 DIAGNOSIS — Z931 Gastrostomy status: Secondary | ICD-10-CM | POA: Diagnosis not present

## 2023-07-03 DIAGNOSIS — Z510 Encounter for antineoplastic radiation therapy: Secondary | ICD-10-CM | POA: Diagnosis not present

## 2023-07-03 DIAGNOSIS — C159 Malignant neoplasm of esophagus, unspecified: Secondary | ICD-10-CM

## 2023-07-03 LAB — CBC WITH DIFFERENTIAL (CANCER CENTER ONLY)
Abs Immature Granulocytes: 0.64 10*3/uL — ABNORMAL HIGH (ref 0.00–0.07)
Basophils Absolute: 0.1 10*3/uL (ref 0.0–0.1)
Basophils Relative: 1 %
Eosinophils Absolute: 0 10*3/uL (ref 0.0–0.5)
Eosinophils Relative: 0 %
HCT: 36.3 % — ABNORMAL LOW (ref 39.0–52.0)
Hemoglobin: 12.3 g/dL — ABNORMAL LOW (ref 13.0–17.0)
Immature Granulocytes: 16 %
Lymphocytes Relative: 7 %
Lymphs Abs: 0.3 10*3/uL — ABNORMAL LOW (ref 0.7–4.0)
MCH: 31.1 pg (ref 26.0–34.0)
MCHC: 33.9 g/dL (ref 30.0–36.0)
MCV: 91.7 fL (ref 80.0–100.0)
Monocytes Absolute: 0.8 10*3/uL (ref 0.1–1.0)
Monocytes Relative: 21 %
Neutro Abs: 2.2 10*3/uL (ref 1.7–7.7)
Neutrophils Relative %: 55 %
Platelet Count: 303 10*3/uL (ref 150–400)
RBC: 3.96 MIL/uL — ABNORMAL LOW (ref 4.22–5.81)
RDW: 17.1 % — ABNORMAL HIGH (ref 11.5–15.5)
WBC Count: 3.9 10*3/uL — ABNORMAL LOW (ref 4.0–10.5)
nRBC: 5.3 % — ABNORMAL HIGH (ref 0.0–0.2)

## 2023-07-03 LAB — CMP (CANCER CENTER ONLY)
ALT: 19 U/L (ref 0–44)
AST: 20 U/L (ref 15–41)
Albumin: 3.3 g/dL — ABNORMAL LOW (ref 3.5–5.0)
Alkaline Phosphatase: 65 U/L (ref 38–126)
Anion gap: 5 (ref 5–15)
BUN: 33 mg/dL — ABNORMAL HIGH (ref 8–23)
CO2: 27 mmol/L (ref 22–32)
Calcium: 8.8 mg/dL — ABNORMAL LOW (ref 8.9–10.3)
Chloride: 104 mmol/L (ref 98–111)
Creatinine: 0.79 mg/dL (ref 0.61–1.24)
GFR, Estimated: >60 mL/min (ref >60)
Glucose, Bld: 105 mg/dL — ABNORMAL HIGH (ref 70–99)
Potassium: 4.4 mmol/L (ref 3.5–5.1)
Sodium: 136 mmol/L (ref 135–145)
Total Bilirubin: 0.4 mg/dL (ref 0.0–1.2)
Total Protein: 5.8 g/dL — ABNORMAL LOW (ref 6.5–8.1)

## 2023-07-03 LAB — RAD ONC ARIA SESSION SUMMARY
Course Elapsed Days: 42
Plan Fractions Treated to Date: 3
Plan Prescribed Dose Per Fraction: 1.8 Gy
Plan Total Fractions Prescribed: 3
Plan Total Prescribed Dose: 5.4 Gy
Reference Point Dosage Given to Date: 5.4 Gy
Reference Point Session Dosage Given: 1.8 Gy
Session Number: 28

## 2023-07-03 MED ORDER — SODIUM CHLORIDE 0.9% FLUSH: 10.0000 mL | Freq: Once | INTRAVENOUS | Status: DC

## 2023-07-03 MED ORDER — FAMOTIDINE 40 MG/5ML PO SUSR: 40.0000 mg | Freq: Every day | ORAL | 2 refills | Status: DC

## 2023-07-03 MED ORDER — HEPARIN SOD (PORK) LOCK FLUSH 100 UNIT/ML IV SOLN: 500.0000 [IU] | Freq: Once | INTRAVENOUS | Status: DC

## 2023-07-03 MED ORDER — ONDANSETRON HCL 4 MG PO TABS: 4.0000 mg | ORAL_TABLET | Freq: Three times a day (TID) | ORAL | 2 refills | Status: DC | PRN

## 2023-07-03 NOTE — Assessment & Plan Note (Addendum)
-  cTxN0M0 -Patient presented to hospital with progressive dysphagia and weight loss. -EGD showed a malignant appearing severe stenosis 30 to 34 cm from incisors.  The stenosis measured 4 cm, the scope was not able to pass.  Biopsy confirmed adenocarcinoma -He underwent PEG feeding tube for nutrition -PET scan showed hypermetabolic lesion in the distal esophagus, no nodal or distant metastasis.   -pt was evaluated by cardiothoracic surgeon Dr. Cliffton Asters, he is not a good candidate for surgery, but will re-evaluate him after chemoradiation -He started concurrent chemoradiation with weekly carboplatin and Taxol on 05/22/23, he is tolerating moderately well overall  -06/26/2023 -today is cycle 1 day 36. This is to be final chemotherapy treatment. Final radiation appointment scheduled for 07/03/2023. Will see me back after labs and final radiation appointment.  -PET scan scheduled for 08/03/2023.

## 2023-07-03 NOTE — Progress Notes (Unsigned)
Patient Care Team: [name], MD as PCP - General (Family Medicine) [name], MD as PCP - Cardiology (Cardiology) [name], MD as Consulting Physician (Hematology and Oncology)  Clinic Day:  07/03/2023  Referring physician: [name], MD  ASSESSMENT & PLAN:   Assessment & Plan: Malignant neoplasm of esophagus (HCC) -cTxN0M0 -Patient presented to hospital with progressive dysphagia and weight loss. -EGD showed a malignant appearing severe stenosis 30 to 34 cm from incisors.  The stenosis measured 4 cm, the scope was not able to pass.  Biopsy confirmed adenocarcinoma -He underwent PEG feeding tube for nutrition -PET scan showed hypermetabolic lesion in the distal esophagus, no nodal or distant metastasis.   -pt was evaluated by cardiothoracic surgeon Dr. Cliffton Asters, he is not a good candidate for surgery, but will re-evaluate him after chemoradiation -He started concurrent chemoradiation with weekly carboplatin and Taxol on 05/22/23, he is tolerating moderately well overall  -06/26/2023 -today is cycle 1 day 36. This is to be final chemotherapy treatment. Final radiation appointment scheduled for 07/03/2023. Will see me back after labs and final radiation appointment.  -PET scan scheduled for 08/03/2023.     The patient understands the plans discussed today and is in agreement with them.  He knows to contact our office if he develops concerns prior to his next appointment.  I provided *** minutes of face-to-face time during this encounter and > 50% was spent counseling as documented under my assessment and plan.    Carlean Jews, NP  [facility] - A DEPT OF [facility] [address] [place] [address] Dept: [phone] Dept Fax: [phone]   No orders of the defined types were placed in this encounter.     CHIEF COMPLAINT:  CC: esophageal cancer   Current Treatment:  concurrent  chemoradiation with weekly carboplatin and taxol. Completed chemotherapy 06/26/2023. Completed radiation 07/03/2023.   INTERVAL HISTORY:  Jaythen is here today for repeat clinical assessment. Last saw me on 06/26/2023. Today, 07/03/2023, is his last radiation treatment. He is scheduled for restaging PET 08/03/2023. Has had increased stress, as he is taking care of his brother, who is being treated for end-stage cancer. He denies fevers or chills. He denies pain. His appetite is good. His weight {Weight change:10426}.  I have reviewed the past medical history, past surgical history, social history and family history with the patient and they are unchanged from previous note.  ALLERGIES:  is allergic to penicillins, sulfa antibiotics, and procaine.  MEDICATIONS:  Current Outpatient Medications  Medication Sig Dispense Refill   apixaban (ELIQUIS) 5 MG TABS tablet Place 1 tablet (5 mg total) into feeding tube 2 (two) times daily. 60 tablet 1   Blood Glucose Monitoring Suppl (ACCU-CHEK GUIDE ME) w/Device KIT USE AS DIRECTED 3 TIMES A DAY 1 kit 0   dexamethasone (DECADRON) 4 MG tablet Take 2 tablets daily for 2 days, start the day after chemotherapy. Take with food.     glucose blood (ACCU-CHEK GUIDE) test strip Use as instructed daily 100 each 12   ondansetron (ZOFRAN) 4 MG tablet Take 1 tablet (4 mg total) by mouth every 8 (eight) hours as needed for nausea or vomiting. 20 tablet 2   prochlorperazine (COMPAZINE) 10 MG tablet Take 1 tablet (10 mg total) by mouth every 6 (six) hours as needed for nausea or vomiting.     Water For Irrigation, Sterile (FREE WATER) SOLN Place 120 mLs into  feeding tube 6 (six) times daily. 24000 mL 0   No current facility-administered medications for this visit.    HISTORY OF PRESENT ILLNESS:   Oncology History  Malignant neoplasm of esophagus (HCC)  04/18/2023 Cancer Staging   Staging form: Esophagus - Adenocarcinoma, AJCC 8th Edition - Clinical stage from  04/18/2023: Stage Unknown (cTX, cN0, cM0) - Signed by [name], MD on 05/04/2023 Total positive nodes: 0   04/20/2023 Initial Diagnosis   Malignant neoplasm of lower third of esophagus (HCC)   05/22/2023 -  Chemotherapy   Patient is on Treatment Plan : ESOPHAGUS Carboplatin + Paclitaxel Weekly X 6 Weeks with XRT         REVIEW OF SYSTEMS:   Constitutional: Denies fevers, chills or abnormal weight loss Eyes: Denies blurriness of vision Ears, nose, mouth, throat, and face: Denies mucositis or sore throat Respiratory: Denies cough, dyspnea or wheezes Cardiovascular: Denies palpitation, chest discomfort or lower extremity swelling Gastrointestinal:  Denies nausea, heartburn or change in bowel habits Skin: Denies abnormal skin rashes Lymphatics: Denies new lymphadenopathy or easy bruising Neurological:Denies numbness, tingling or new weaknesses Behavioral/Psych: Mood is stable, no new changes  All other systems were reviewed with the patient and are negative.   VITALS:  There were no vitals taken for this visit.  Wt Readings from Last 3 Encounters:  06/26/23 177 lb 3.2 oz (80.4 kg)  06/22/23 171 lb 12.8 oz (77.9 kg)  06/19/23 169 lb 8 oz (76.9 kg)    There is no height or weight on file to calculate BMI.  Performance status (ECOG): {CHL ONC [id number]  PHYSICAL EXAM:   GENERAL:alert, no distress and comfortable SKIN: skin color, texture, turgor are normal, no rashes or significant lesions EYES: normal, Conjunctiva are pink and non-injected, sclera clear OROPHARYNX:no exudate, no erythema and lips, buccal mucosa, and tongue normal  NECK: supple, thyroid normal size, non-tender, without nodularity LYMPH:  no palpable lymphadenopathy in the cervical, axillary or inguinal LUNGS: clear to auscultation and percussion with normal breathing effort HEART: regular rate & rhythm and no murmurs and no lower extremity edema ABDOMEN:abdomen soft, non-tender and normal bowel  sounds Musculoskeletal:no cyanosis of digits and no clubbing  NEURO: alert & oriented x 3 with fluent speech, no focal motor/sensory deficits  LABORATORY DATA:  I have reviewed the data as listed    Component Value Date/Time   NA 137 06/26/2023 0829   NA 144 08/09/2022 1425   K 4.1 06/26/2023 0829   CL 105 06/26/2023 0829   CO2 27 06/26/2023 0829   GLUCOSE 148 (H) 06/26/2023 0829   BUN 27 (H) 06/26/2023 0829   BUN 15 08/09/2022 1425   CREATININE 0.73 06/26/2023 0829   CALCIUM 8.4 (L) 06/26/2023 0829   PROT 5.3 (L) 06/26/2023 0829   PROT 6.5 08/09/2022 1425   ALBUMIN 3.2 (L) 06/26/2023 0829   ALBUMIN 4.2 08/09/2022 1425   AST 17 06/26/2023 0829   ALT 17 06/26/2023 0829   ALKPHOS 56 06/26/2023 0829   BILITOT 0.6 06/26/2023 0829   GFRNONAA >60 06/26/2023 0829   GFRAA 83 06/15/2015 0941    No results found for: "SPEP", "UPEP"  Lab Results  Component Value Date   WBC 1.4 (L) 06/26/2023   NEUTROABS 0.9 (L) 06/26/2023   HGB 12.5 (L) 06/26/2023   HCT 36.4 (L) 06/26/2023   MCV 90.3 06/26/2023   PLT 215 06/26/2023      Chemistry      Component Value Date/Time   NA 137 06/26/2023 0829  NA 144 08/09/2022 1425   K 4.1 06/26/2023 0829   CL 105 06/26/2023 0829   CO2 27 06/26/2023 0829   BUN 27 (H) 06/26/2023 0829   BUN 15 08/09/2022 1425   CREATININE 0.73 06/26/2023 0829      Component Value Date/Time   CALCIUM 8.4 (L) 06/26/2023 0829   ALKPHOS 56 06/26/2023 0829   AST 17 06/26/2023 0829   ALT 17 06/26/2023 0829   BILITOT 0.6 06/26/2023 0829       RADIOGRAPHIC STUDIES: I have personally reviewed the radiological images as listed and agreed with the findings in the report. No results found.

## 2023-07-04 NOTE — Radiation Completion Notes (Addendum)
  Radiation Oncology         [phone] ________________________________  Name: Samuel [name] MRN: [id number]  Date of Service: 07/03/2023  DOB: December 10, 1945  End of Treatment Note    Diagnosis: cT3N0M0, Adenocarcinoma of the distal esophagus  Intent: Curative     ==========DELIVERED PLANS==========  First Treatment Date: 2023-05-22 Last Treatment Date: 2023-07-03   Plan Name: Esoph Site: Esophagus Technique: IMRT Mode: Photon Dose Per Fraction: 1.8 Gy Prescribed Dose (Delivered / Prescribed): 45 Gy / 45 Gy Prescribed Fxs (Delivered / Prescribed): 25 / 25   Plan Name: Esoph_Bst Site: Esophagus Technique: IMRT Mode: Photon Dose Per Fraction: 1.8 Gy Prescribed Dose (Delivered / Prescribed): 5.4 Gy / 5.4 Gy Prescribed Fxs (Delivered / Prescribed): 3 / 3     ==========ON TREATMENT VISIT DATES========== 2023-05-26, 2023-06-05, 2023-06-09, 2023-06-16, 2023-06-23, 2023-06-30    See weekly On Treatment Notes in Epic for details in the Media tab (listed as Progress notes on the On Treatment Visit Dates listed above). The patient tolerated radiation. He developed fatigue and esophagitis.   The patient will receive a call in about one month from the radiation oncology department. He will continue follow up with Dr. Mosetta Putt as well and Dr. Cliffton Asters.      Osker Mason, PAC

## 2023-07-05 DIAGNOSIS — R1310 Dysphagia, unspecified: Secondary | ICD-10-CM | POA: Diagnosis not present

## 2023-07-05 DIAGNOSIS — C155 Malignant neoplasm of lower third of esophagus: Secondary | ICD-10-CM | POA: Diagnosis not present

## 2023-07-07 ENCOUNTER — Inpatient Hospital Stay: Payer: 59 | Attending: Hematology | Admitting: Licensed Clinical Social Worker

## 2023-07-07 DIAGNOSIS — C155 Malignant neoplasm of lower third of esophagus: Secondary | ICD-10-CM

## 2023-07-07 NOTE — Progress Notes (Signed)
 [facility] CSW Progress Note  Visual Merchandiser  spoke w/ pt regarding transportation options.  Pt would like to find transportation that will bring him to the gym and are able to accommodate folding up equipment and putting it in the car if needed.  CSW recommended pt contact the [employer] regarding resources.  CSW to remain available as appropriate.        Samuel JONELLE Manna, LCSW Clinical Social Worker [facility]    Patient is participating in a Managed Medicaid Plan:  Yes

## 2023-07-09 ENCOUNTER — Encounter: Payer: Self-pay | Admitting: Nurse Practitioner

## 2023-07-09 ENCOUNTER — Encounter: Payer: Self-pay | Admitting: Hematology

## 2023-07-10 DIAGNOSIS — S92424B Nondisplaced fracture of distal phalanx of right great toe, initial encounter for open fracture: Secondary | ICD-10-CM | POA: Diagnosis not present

## 2023-07-11 ENCOUNTER — Encounter (INDEPENDENT_AMBULATORY_CARE_PROVIDER_SITE_OTHER): Payer: Self-pay

## 2023-07-11 ENCOUNTER — Ambulatory Visit (INDEPENDENT_AMBULATORY_CARE_PROVIDER_SITE_OTHER): Payer: 59 | Admitting: Otolaryngology

## 2023-07-11 VITALS — BP 116/69 | HR 83 | Ht 66.0 in | Wt 172.0 lb

## 2023-07-11 DIAGNOSIS — H900 Conductive hearing loss, bilateral: Secondary | ICD-10-CM | POA: Diagnosis not present

## 2023-07-11 DIAGNOSIS — H6123 Impacted cerumen, bilateral: Secondary | ICD-10-CM | POA: Diagnosis not present

## 2023-07-12 ENCOUNTER — Ambulatory Visit: Payer: Self-pay

## 2023-07-12 DIAGNOSIS — H900 Conductive hearing loss, bilateral: Secondary | ICD-10-CM | POA: Insufficient documentation

## 2023-07-12 DIAGNOSIS — H6123 Impacted cerumen, bilateral: Secondary | ICD-10-CM | POA: Insufficient documentation

## 2023-07-12 NOTE — Progress Notes (Signed)
 Patient ID: Samuel [name], male   DOB: 1945-08-18, 78 y.o.   MRN: [id number]  CC: Bilateral muffled hearing  HPI:  Samuel [name] is a 78 y.o. male who presents today complaining of bilateral hearing loss.  He has been symptomatic for several months.  He was recently noted to have bilateral cerumen impaction.  The patient denies any recent otitis media or otitis externa.  He has no previous otologic surgery.  He has never worn hearing aids.  He underwent adenotonsillectomy surgery as a child.  Currently he denies any otalgia or otorrhea.  Past Medical History:  Diagnosis Date   Diabetes mellitus without complication (HCC)    Esophageal cancer [facility])    Pulmonary embolism (HCC)    In October 2024    Past Surgical History:  Procedure Laterality Date   BIOPSY  04/18/2023   Procedure: BIOPSY;  Surgeon: Kriss Estefana DEL, DO;  Location[facility] ENDOSCOPY;  Service: Gastroenterology;;   ESOPHAGOGASTRODUODENOSCOPY N/A 04/18/2023   Procedure: ESOPHAGOGASTRODUODENOSCOPY (EGD);  Surgeon: Kriss Estefana DEL, DO;  Location: [facility] ENDOSCOPY;  Service: Gastroenterology;  Laterality: N/A;   IR GASTROSTOMY TUBE MOD SED  04/20/2023   IR NASO G TUBE PLC W/FL W/RAD  04/20/2023   KNEE ARTHROPLASTY Bilateral     Family History  Problem Relation Age of Onset   Hypertension Father    COPD Father    COPD Sister    Diabetes Maternal Grandfather     Social History:  reports that he quit smoking about 38 years ago. His smoking use included cigarettes. He started smoking about 53 years ago. He has a 15 pack-year smoking history. He has never used smokeless tobacco. He reports current alcohol  use of about 1.0 - 2.0 standard drink of alcohol  per week. He reports that he does not use drugs.  Allergies:  Allergies  Allergen Reactions   Penicillins     Shortness of breath   Sulfa Antibiotics     Blisters    Procaine Other (See Comments)    Caused fatigue    Prior to Admission medications   Medication  Sig Start Date End Date Taking? Authorizing Provider  apixaban  (ELIQUIS ) 5 MG TABS tablet Place 1 tablet (5 mg total) into feeding tube 2 (two) times daily. 06/22/23  Yes Newlin, Enobong, MD  Blood Glucose Monitoring Suppl (ACCU-CHEK GUIDE ME) w/Device KIT USE AS DIRECTED 3 TIMES A DAY 01/16/23  Yes Newlin, Enobong, MD  dexamethasone  (DECADRON ) 4 MG tablet Take 2 tablets daily for 2 days, start the day after chemotherapy. Take with food. 05/29/23  Yes Boscia, Powell BRAVO, NP  famotidine  (PEPCID ) 40 MG/5ML suspension Take 5 mLs (40 mg total) by mouth daily. 07/03/23  Yes Boscia, Heather E, NP  glucose blood (ACCU-CHEK GUIDE) test strip Use as instructed daily 12/19/22  Yes Newlin, Enobong, MD  ondansetron  (ZOFRAN ) 4 MG tablet Take 1 tablet (4 mg total) by mouth every 8 (eight) hours as needed for nausea or vomiting. 07/03/23  Yes Boscia, Heather E, NP  prochlorperazine  (COMPAZINE ) 10 MG tablet Take 1 tablet (10 mg total) by mouth every 6 (six) hours as needed for nausea or vomiting. 05/29/23  Yes Boscia, Heather E, NP  Water  For Irrigation, Sterile (FREE WATER ) SOLN Place 120 mLs into feeding tube 6 (six) times daily. 04/25/23  Yes Lue Elsie BROCKS, MD    Blood pressure 116/69, pulse 83, height 5' 6 (1.676 m), weight 172 lb (78 kg), SpO2 94%. Exam: General: Communicates without difficulty, well nourished, no acute  distress. Head: Normocephalic, no evidence injury, no tenderness, facial buttresses intact without stepoff. Face/sinus: No tenderness to palpation and percussion. Facial movement is normal and symmetric. Eyes: PERRL, EOMI. No scleral icterus, conjunctivae clear. Neuro: CN II exam reveals vision grossly intact.  No nystagmus at any point of gaze. Ears: Auricles well formed without lesions.  Bilateral cerumen impaction.  Nose: External evaluation reveals normal support and skin without lesions.  Dorsum is intact.  Anterior rhinoscopy reveals congested mucosa over anterior aspect of inferior  turbinates and intact septum.  No purulence noted. Oral:  Oral cavity and oropharynx are intact, symmetric, without erythema or edema.  Mucosa is moist without lesions. Neck: Full range of motion without pain.  There is no significant lymphadenopathy.  No masses palpable.  Thyroid  bed within normal limits to palpation.  Parotid glands and submandibular glands equal bilaterally without mass.  Trachea is midline. Neuro:  CN 2-12 grossly intact.   Procedure: Bilateral cerumen disimpaction Anesthesia: None Description: Under the operating microscope, the cerumen is carefully removed with a combination of cerumen currette, alligator forceps, and suction catheters.  After the cerumen is removed, the TMs are noted to be normal.  No mass, erythema, or lesions. The patient tolerated the procedure well.    Assessment: 1.  Bilateral dense cerumen impaction, causing bilateral conductive hearing loss. 2.  The patient reports significant improvement in his hearing after the cerumen disimpaction procedure. 3.  Both tympanic membranes and middle ear spaces are noted to be normal.  Plan: 1.  Otomicroscopy with bilateral cerumen disimpaction. 2.  The physical exam findings are reviewed with the patient. 3.  The patient is instructed not to use Q-tips to clean his ear canals. 4.  The patient is encouraged to call with any questions or concerns.  Modest Draeger W Andrya Roppolo 07/12/2023, 11:05 AM

## 2023-07-12 NOTE — Telephone Encounter (Signed)
 Called pt's pharmacy and spoke to Laquesta who stated his Doxycycline  can be crushed and no interaction of med.   Chief Complaint: Can his doxycycline  be crushed and is there any interaction with it and his other meds  Disposition: [] ED /[] Urgent Care (no appt availability in office) / [] Appointment(In office/virtual)/ []  [facility]/ [] Home Care/ [] Refused Recommended Disposition /[] [facility]/ []  Follow-up with PCP Additional Notes: called his pharmacy and was advised he can crush up his Doxycycline  and there is no interaction with his other meds.   Pt stated [employer] is out of  is out of Prosource (prescribed by Registered Dietician at [facility]) Pt stated the RD is out on vacation and wanted his PCP to make sure it was ok to order, LPS nutrition (liquid collagen, whey powder 15 grams per packet)  Reason for Disposition . [1] Caller has medicine question about med NOT prescribed by PCP AND [2] triager unable to answer question (e.g., compatibility with other med, storage)  Answer Assessment - Initial Assessment Questions 1. NAME of MEDICINE: What medicine(s) are you calling about?     Doxycycline  2. QUESTION: What is your question? (e.g., double dose of medicine, side effect)     Can his doxycycline  be crushed and is there any interaction with it and his other meds 3. PRESCRIBER: Who prescribed the medicine? Reason: if prescribed by specialist, call should be referred to that group.     [facility] 4. SYMPTOMS: Do you have any symptoms? If Yes, ask: What symptoms are you having?  How bad are the symptoms (e.g., mild, moderate, severe)     None- has not started it yet  Protocols used: Medication Question Call-A-AH

## 2023-07-12 NOTE — Telephone Encounter (Signed)
 Spoke with patient.  Patient voiced that he was going to proceed with taking LPS nutrition (liquid collagen, whey powder 15 grams per packet) because as he reads the contents  of the product it contains the same thing as the prosource. Advised patient to continue to try and reach his dietitian so that they are aware. Patient voiced that as soon as the prosource be comes available it [employer] he will order.

## 2023-07-14 ENCOUNTER — Inpatient Hospital Stay: Payer: 59

## 2023-07-14 ENCOUNTER — Telehealth: Payer: Self-pay

## 2023-07-14 NOTE — Telephone Encounter (Signed)
 Nutrition  Requested by nursing to call patient regarding protein supplementation that he is using.  RD has not received any messages from patient.   Called and spoke with patient this am.  Patient has been purchasing prosource TF liquid protein (40 calories and 11 gm protein per 45ml) via [employer].  Says that it was not available but did receive a shipment yesterday.  He had purchased a LPS nutrition (liquid collagen, whey powder, 15 grams per packet) while waiting on prosource TF liquid protein to arrive.   Discussed with patient alternative options for protein supplementation would be Prosource (100 calories, 10 gm protein) 30ml TID or Prostat (100 calories, 15 gm protein) per 30ml BID.  Patient verbalized understanding and will send LPS nutrition back to company.  Appreciative of call.  Bevan Disney B. Dasie, RD, LDN Registered Dietitian [phone]

## 2023-07-14 NOTE — Progress Notes (Signed)
Nutrition  See phone note.  Kendria Halberg B. Zenia Resides, [place], [place] Registered Dietitian [phone]

## 2023-07-18 ENCOUNTER — Encounter (HOSPITAL_COMMUNITY): Payer: Self-pay

## 2023-07-18 ENCOUNTER — Emergency Department (HOSPITAL_COMMUNITY): Payer: 59

## 2023-07-18 ENCOUNTER — Inpatient Hospital Stay (HOSPITAL_COMMUNITY)
Admission: EM | Admit: 2023-07-18 | Discharge: 2023-07-23 | DRG: 603 | Disposition: A | Discharge status: Home / Self Care | Payer: 59 | Attending: Internal Medicine | Admitting: Internal Medicine

## 2023-07-18 ENCOUNTER — Emergency Department (HOSPITAL_BASED_OUTPATIENT_CLINIC_OR_DEPARTMENT_OTHER): Payer: 59

## 2023-07-18 ENCOUNTER — Ambulatory Visit (HOSPITAL_COMMUNITY)
Admission: EM | Admit: 2023-07-18 | Discharge: 2023-07-18 | Disposition: A | Discharge status: Home / Self Care | Payer: 59

## 2023-07-18 ENCOUNTER — Other Ambulatory Visit: Payer: Self-pay

## 2023-07-18 DIAGNOSIS — Z833 Family history of diabetes mellitus: Secondary | ICD-10-CM | POA: Diagnosis not present

## 2023-07-18 DIAGNOSIS — Z7901 Long term (current) use of anticoagulants: Secondary | ICD-10-CM

## 2023-07-18 DIAGNOSIS — L089 Local infection of the skin and subcutaneous tissue, unspecified: Secondary | ICD-10-CM

## 2023-07-18 DIAGNOSIS — Z96653 Presence of artificial knee joint, bilateral: Secondary | ICD-10-CM | POA: Diagnosis present

## 2023-07-18 DIAGNOSIS — Z884 Allergy status to anesthetic agent status: Secondary | ICD-10-CM | POA: Diagnosis not present

## 2023-07-18 DIAGNOSIS — Z880 Allergy status to penicillin: Secondary | ICD-10-CM | POA: Diagnosis not present

## 2023-07-18 DIAGNOSIS — R9389 Abnormal findings on diagnostic imaging of other specified body structures: Secondary | ICD-10-CM | POA: Diagnosis not present

## 2023-07-18 DIAGNOSIS — I82542 Chronic embolism and thrombosis of left tibial vein: Secondary | ICD-10-CM | POA: Diagnosis present

## 2023-07-18 DIAGNOSIS — M7732 Calcaneal spur, left foot: Secondary | ICD-10-CM | POA: Diagnosis not present

## 2023-07-18 DIAGNOSIS — I10 Essential (primary) hypertension: Secondary | ICD-10-CM | POA: Diagnosis not present

## 2023-07-18 DIAGNOSIS — T148XXA Other injury of unspecified body region, initial encounter: Secondary | ICD-10-CM | POA: Diagnosis not present

## 2023-07-18 DIAGNOSIS — I700 Atherosclerosis of aorta: Secondary | ICD-10-CM | POA: Diagnosis not present

## 2023-07-18 DIAGNOSIS — Z87891 Personal history of nicotine dependence: Secondary | ICD-10-CM | POA: Diagnosis not present

## 2023-07-18 DIAGNOSIS — H902 Conductive hearing loss, unspecified: Secondary | ICD-10-CM | POA: Diagnosis not present

## 2023-07-18 DIAGNOSIS — L03116 Cellulitis of left lower limb: Secondary | ICD-10-CM | POA: Diagnosis not present

## 2023-07-18 DIAGNOSIS — L03115 Cellulitis of right lower limb: Principal | ICD-10-CM | POA: Diagnosis present

## 2023-07-18 DIAGNOSIS — I110 Hypertensive heart disease with heart failure: Secondary | ICD-10-CM | POA: Diagnosis present

## 2023-07-18 DIAGNOSIS — Z86711 Personal history of pulmonary embolism: Secondary | ICD-10-CM

## 2023-07-18 DIAGNOSIS — I872 Venous insufficiency (chronic) (peripheral): Secondary | ICD-10-CM | POA: Diagnosis present

## 2023-07-18 DIAGNOSIS — B351 Tinea unguium: Secondary | ICD-10-CM | POA: Diagnosis present

## 2023-07-18 DIAGNOSIS — Z886 Allergy status to analgesic agent status: Secondary | ICD-10-CM

## 2023-07-18 DIAGNOSIS — I5022 Chronic systolic (congestive) heart failure: Secondary | ICD-10-CM | POA: Diagnosis present

## 2023-07-18 DIAGNOSIS — L03119 Cellulitis of unspecified part of limb: Secondary | ICD-10-CM | POA: Diagnosis not present

## 2023-07-18 DIAGNOSIS — Z882 Allergy status to sulfonamides status: Secondary | ICD-10-CM

## 2023-07-18 DIAGNOSIS — E119 Type 2 diabetes mellitus without complications: Secondary | ICD-10-CM | POA: Diagnosis present

## 2023-07-18 DIAGNOSIS — R001 Bradycardia, unspecified: Secondary | ICD-10-CM | POA: Diagnosis not present

## 2023-07-18 DIAGNOSIS — Z8501 Personal history of malignant neoplasm of esophagus: Secondary | ICD-10-CM | POA: Diagnosis not present

## 2023-07-18 DIAGNOSIS — Z825 Family history of asthma and other chronic lower respiratory diseases: Secondary | ICD-10-CM

## 2023-07-18 DIAGNOSIS — L0390 Cellulitis, unspecified: Secondary | ICD-10-CM | POA: Diagnosis present

## 2023-07-18 DIAGNOSIS — Z8249 Family history of ischemic heart disease and other diseases of the circulatory system: Secondary | ICD-10-CM | POA: Diagnosis not present

## 2023-07-18 DIAGNOSIS — R9431 Abnormal electrocardiogram [ECG] [EKG]: Secondary | ICD-10-CM | POA: Diagnosis not present

## 2023-07-18 DIAGNOSIS — M7989 Other specified soft tissue disorders: Secondary | ICD-10-CM

## 2023-07-18 DIAGNOSIS — M19071 Primary osteoarthritis, right ankle and foot: Secondary | ICD-10-CM | POA: Diagnosis not present

## 2023-07-18 DIAGNOSIS — Z931 Gastrostomy status: Secondary | ICD-10-CM

## 2023-07-18 LAB — COMPREHENSIVE METABOLIC PANEL
ALT: 20 U/L (ref 0–44)
AST: 29 U/L (ref 15–41)
Albumin: 2.8 g/dL — ABNORMAL LOW (ref 3.5–5.0)
Alkaline Phosphatase: 91 U/L (ref 38–126)
Anion gap: 12 (ref 5–15)
BUN: 30 mg/dL — ABNORMAL HIGH (ref 8–23)
CO2: 23 mmol/L (ref 22–32)
Calcium: 9 mg/dL (ref 8.9–10.3)
Chloride: 100 mmol/L (ref 98–111)
Creatinine, Ser: 0.89 mg/dL (ref 0.61–1.24)
GFR, Estimated: >60 mL/min (ref >60)
Glucose, Bld: 124 mg/dL — ABNORMAL HIGH (ref 70–99)
Potassium: 4.6 mmol/L (ref 3.5–5.1)
Sodium: 135 mmol/L (ref 135–145)
Total Bilirubin: 0.9 mg/dL (ref 0.0–1.2)
Total Protein: 6.1 g/dL — ABNORMAL LOW (ref 6.5–8.1)

## 2023-07-18 LAB — CBC WITH DIFFERENTIAL/PLATELET
Abs Immature Granulocytes: 0.47 10*3/uL — ABNORMAL HIGH (ref 0.00–0.07)
Basophils Absolute: 0.1 10*3/uL (ref 0.0–0.1)
Basophils Relative: 1 %
Eosinophils Absolute: 0 10*3/uL (ref 0.0–0.5)
Eosinophils Relative: 0 %
HCT: 39.5 % (ref 39.0–52.0)
Hemoglobin: 13.3 g/dL (ref 13.0–17.0)
Immature Granulocytes: 4 %
Lymphocytes Relative: 3 %
Lymphs Abs: 0.4 10*3/uL — ABNORMAL LOW (ref 0.7–4.0)
MCH: 31.8 pg (ref 26.0–34.0)
MCHC: 33.7 g/dL (ref 30.0–36.0)
MCV: 94.5 fL (ref 80.0–100.0)
Monocytes Absolute: 1.5 10*3/uL — ABNORMAL HIGH (ref 0.1–1.0)
Monocytes Relative: 13 %
Neutro Abs: 9.6 10*3/uL — ABNORMAL HIGH (ref 1.7–7.7)
Neutrophils Relative %: 79 %
Platelets: 114 10*3/uL — ABNORMAL LOW (ref 150–400)
RBC: 4.18 MIL/uL — ABNORMAL LOW (ref 4.22–5.81)
RDW: 18.9 % — ABNORMAL HIGH (ref 11.5–15.5)
WBC: 12.1 10*3/uL — ABNORMAL HIGH (ref 4.0–10.5)
nRBC: 0.2 % (ref 0.0–0.2)

## 2023-07-18 LAB — I-STAT CG4 LACTIC ACID, ED: Lactic Acid, Venous: 1.6 mmol/L (ref 0.5–1.9)

## 2023-07-18 LAB — BRAIN NATRIURETIC PEPTIDE: B Natriuretic Peptide: 123.2 pg/mL — ABNORMAL HIGH (ref 0.0–100.0)

## 2023-07-18 MED ORDER — OXYCODONE-ACETAMINOPHEN 5-325 MG PO TABS
1.0000 | ORAL_TABLET | ORAL | Status: DC | PRN
Start: 2023-07-18 — End: 2023-07-19
  Administered 2023-07-18: 1 via ORAL
  Filled 2023-07-18 (×2): qty 1

## 2023-07-18 NOTE — ED Notes (Signed)
 Patient is being discharged from the Urgent Care and sent to the Emergency Department via [facility] . Per provider, patient is in need of higher level of care due to leg wound and extremity weakness. Patient is aware and verbalizes understanding of plan of care.  Vitals:   07/18/23 1556 07/18/23 1558  BP:  (!) 107/53  Pulse: 62   Resp: 16   Temp: 97.9 F (36.6 C)   SpO2: 94%

## 2023-07-18 NOTE — ED Notes (Signed)
 DP and PT pulses present with doppler.

## 2023-07-18 NOTE — ED Triage Notes (Signed)
 Pt arrives via [facility] from UC. Pt reports severe swelling and redness to bilateral feet and ankles that started 1 day ago. Pt is concerned he might be having a reaction to socks that he was wearing. Pt is AxOx4.

## 2023-07-18 NOTE — Discharge Instructions (Signed)
 Go to ER for further evaluation .

## 2023-07-18 NOTE — ED Triage Notes (Signed)
 Pt states bilateral leg swelling with drainage for the past with oozing since last night. Pt has pillowcases on  his legs.  Clear drainage noted to the pillowcases.

## 2023-07-18 NOTE — ED Provider Triage Note (Signed)
 Emergency Medicine Provider Triage Evaluation Note  Kyros Salzwedel , a 78 y.o. male  was evaluated in triage.  Pt complains of progressive leg swelling/redness.  No SOB, no fever.  Review of Systems  Positive: Leg pain/swelling/redness Negative: SOB, fever  Physical Exam  BP (!) 134/98   Pulse 87   Temp 98.3 F (36.8 C)   Resp 17   SpO2 97%  Gen:   Awake, no distress    Resp:  Normal effort   MSK:   Moves extremities without difficulty marked swelling of both lower legs, redness, crusting, weeping Other:     Medical Decision Making  Medically screening exam initiated at 5:58 PM.  Appropriate orders placed.  Travonne Schowalter was informed that the remainder of the evaluation will be completed by another provider, this initial triage assessment does not replace that evaluation, and the importance of remaining in the ED until their evaluation is complete.      Lenor Hollering, MD 07/18/23 [phone]

## 2023-07-18 NOTE — ED Provider Notes (Signed)
 Patient presents with bilateral leg swelling with pus drainage that he states began yesterday after wearing socks that he did not wash prior to wearing. Upon assessment patient's legs are severely swollen with 4+ pitting edema and severely erythematous. Bilateral popliteal, tibial, and pedal pulses are present. Patient has multiple ruptured blisters with yellow pus leaking from them. Patient has recent history of PE and is currently taking Eliquis . Patient also has history of esophageal cancer and recently finished chemo and radiation.  Patient is Samuel [name] poor historian. I discussed with patient that I believe it would be best for him to be evaluated in the emergency department due to severity of symptoms and assessment findings.  Patient agreeable to plan at this time and requesting ambulance transport to the emergency department.   Samuel Flaming A, NP 07/18/23 [phone]

## 2023-07-18 NOTE — ED Notes (Signed)
 Pt transferred to stretch. Linens changed. Lower extremities elevated. X2 side rails up. HOB 30 degrees.

## 2023-07-19 ENCOUNTER — Telehealth: Payer: Self-pay | Admitting: *Deleted

## 2023-07-19 ENCOUNTER — Inpatient Hospital Stay (HOSPITAL_COMMUNITY): Payer: 59

## 2023-07-19 DIAGNOSIS — Z882 Allergy status to sulfonamides status: Secondary | ICD-10-CM | POA: Diagnosis not present

## 2023-07-19 DIAGNOSIS — R9431 Abnormal electrocardiogram [ECG] [EKG]: Secondary | ICD-10-CM

## 2023-07-19 DIAGNOSIS — Z884 Allergy status to anesthetic agent status: Secondary | ICD-10-CM | POA: Diagnosis not present

## 2023-07-19 DIAGNOSIS — Z7901 Long term (current) use of anticoagulants: Secondary | ICD-10-CM | POA: Diagnosis not present

## 2023-07-19 DIAGNOSIS — H902 Conductive hearing loss, unspecified: Secondary | ICD-10-CM | POA: Diagnosis present

## 2023-07-19 DIAGNOSIS — Z86711 Personal history of pulmonary embolism: Secondary | ICD-10-CM | POA: Diagnosis not present

## 2023-07-19 DIAGNOSIS — Z87891 Personal history of nicotine dependence: Secondary | ICD-10-CM | POA: Diagnosis not present

## 2023-07-19 DIAGNOSIS — Z8249 Family history of ischemic heart disease and other diseases of the circulatory system: Secondary | ICD-10-CM | POA: Diagnosis not present

## 2023-07-19 DIAGNOSIS — L0390 Cellulitis, unspecified: Secondary | ICD-10-CM | POA: Diagnosis present

## 2023-07-19 DIAGNOSIS — I872 Venous insufficiency (chronic) (peripheral): Secondary | ICD-10-CM | POA: Diagnosis present

## 2023-07-19 DIAGNOSIS — L03116 Cellulitis of left lower limb: Secondary | ICD-10-CM | POA: Diagnosis present

## 2023-07-19 DIAGNOSIS — Z8501 Personal history of malignant neoplasm of esophagus: Secondary | ICD-10-CM | POA: Diagnosis not present

## 2023-07-19 DIAGNOSIS — L03119 Cellulitis of unspecified part of limb: Secondary | ICD-10-CM | POA: Diagnosis not present

## 2023-07-19 DIAGNOSIS — L03115 Cellulitis of right lower limb: Secondary | ICD-10-CM | POA: Diagnosis present

## 2023-07-19 DIAGNOSIS — I82542 Chronic embolism and thrombosis of left tibial vein: Secondary | ICD-10-CM | POA: Diagnosis present

## 2023-07-19 DIAGNOSIS — Z833 Family history of diabetes mellitus: Secondary | ICD-10-CM | POA: Diagnosis not present

## 2023-07-19 DIAGNOSIS — I5022 Chronic systolic (congestive) heart failure: Secondary | ICD-10-CM | POA: Diagnosis present

## 2023-07-19 DIAGNOSIS — Z931 Gastrostomy status: Secondary | ICD-10-CM | POA: Diagnosis not present

## 2023-07-19 DIAGNOSIS — Z825 Family history of asthma and other chronic lower respiratory diseases: Secondary | ICD-10-CM | POA: Diagnosis not present

## 2023-07-19 DIAGNOSIS — I110 Hypertensive heart disease with heart failure: Secondary | ICD-10-CM | POA: Diagnosis present

## 2023-07-19 DIAGNOSIS — Z880 Allergy status to penicillin: Secondary | ICD-10-CM | POA: Diagnosis not present

## 2023-07-19 DIAGNOSIS — Z886 Allergy status to analgesic agent status: Secondary | ICD-10-CM | POA: Diagnosis not present

## 2023-07-19 DIAGNOSIS — E119 Type 2 diabetes mellitus without complications: Secondary | ICD-10-CM | POA: Diagnosis present

## 2023-07-19 DIAGNOSIS — B351 Tinea unguium: Secondary | ICD-10-CM | POA: Diagnosis present

## 2023-07-19 DIAGNOSIS — Z96653 Presence of artificial knee joint, bilateral: Secondary | ICD-10-CM | POA: Diagnosis present

## 2023-07-19 LAB — CBG MONITORING, ED
Glucose-Capillary: 100 mg/dL — ABNORMAL HIGH (ref 70–99)
Glucose-Capillary: 116 mg/dL — ABNORMAL HIGH (ref 70–99)

## 2023-07-19 LAB — ECHOCARDIOGRAM COMPLETE
Area-P 1/2: 4.71 cm2
Calc EF: 37.6 %
P 1/2 time: 358 ms
S' Lateral: 2.9 cm
Single Plane A2C EF: 29.3 %
Single Plane A4C EF: 47.3 %

## 2023-07-19 LAB — GLUCOSE, CAPILLARY: Glucose-Capillary: 116 mg/dL — ABNORMAL HIGH (ref 70–99)

## 2023-07-19 MED ORDER — INSULIN ASPART 100 UNIT/ML IJ SOLN: 0.0000 [IU] | Freq: Every day | INTRAMUSCULAR | Status: DC

## 2023-07-19 MED ORDER — POLYETHYLENE GLYCOL 3350 17 G PO PACK
17.0000 g | PACK | Freq: Every day | ORAL | Status: DC | PRN
  Administered 2023-07-22: 17 g via ORAL
  Filled 2023-07-19: qty 1

## 2023-07-19 MED ORDER — HYDRALAZINE HCL 20 MG/ML IJ SOLN: 10.0000 mg | Freq: Four times a day (QID) | INTRAMUSCULAR | Status: DC | PRN

## 2023-07-19 MED ORDER — PROSOURCE TF20 ENFIT COMPATIBL EN LIQD
45.0000 mL | Freq: Three times a day (TID) | ENTERAL | Status: DC
Start: 2023-07-19 — End: 2023-07-20
  Administered 2023-07-20 (×2): 45 mL
  Filled 2023-07-19 (×3): qty 60

## 2023-07-19 MED ORDER — SODIUM CHLORIDE 0.9 % IV SOLN
2.0000 g | Freq: Once | INTRAVENOUS | Status: AC
  Administered 2023-07-19: 2 g via INTRAVENOUS
  Filled 2023-07-19: qty 12.5

## 2023-07-19 MED ORDER — OXYCODONE HCL 5 MG PO TABS
5.0000 mg | ORAL_TABLET | Freq: Four times a day (QID) | ORAL | Status: DC | PRN
  Administered 2023-07-19 – 2023-07-23 (×9): 5 mg
  Filled 2023-07-19 (×9): qty 1

## 2023-07-19 MED ORDER — APIXABAN 5 MG PO TABS
5.0000 mg | ORAL_TABLET | Freq: Two times a day (BID) | ORAL | Status: DC
  Administered 2023-07-19 – 2023-07-23 (×9): 5 mg
  Filled 2023-07-19 (×10): qty 1

## 2023-07-19 MED ORDER — INSULIN ASPART 100 UNIT/ML IJ SOLN
0.0000 [IU] | Freq: Three times a day (TID) | INTRAMUSCULAR | Status: DC
  Administered 2023-07-20: 2 [IU] via SUBCUTANEOUS
  Administered 2023-07-20 – 2023-07-23 (×4): 1 [IU] via SUBCUTANEOUS

## 2023-07-19 MED ORDER — VANCOMYCIN HCL 750 MG/150ML IV SOLN
750.0000 mg | Freq: Two times a day (BID) | INTRAVENOUS | Status: DC
  Filled 2023-07-19: qty 150

## 2023-07-19 MED ORDER — BISACODYL 5 MG PO TBEC: 5.0000 mg | DELAYED_RELEASE_TABLET | Freq: Every day | ORAL | Status: DC | PRN

## 2023-07-19 MED ORDER — FAMOTIDINE 20 MG PO TABS
40.0000 mg | ORAL_TABLET | Freq: Every day | ORAL | Status: DC
  Filled 2023-07-19: qty 2

## 2023-07-19 MED ORDER — ALBUTEROL SULFATE (2.5 MG/3ML) 0.083% IN NEBU: 2.5000 mg | INHALATION_SOLUTION | Freq: Four times a day (QID) | RESPIRATORY_TRACT | Status: DC | PRN

## 2023-07-19 MED ORDER — VANCOMYCIN HCL IN DEXTROSE 1-5 GM/200ML-% IV SOLN: 1000.0000 mg | Freq: Once | INTRAVENOUS | Status: DC

## 2023-07-19 MED ORDER — FAMOTIDINE 20 MG PO TABS
40.0000 mg | ORAL_TABLET | Freq: Every day | ORAL | Status: DC
  Administered 2023-07-20: 40 mg
  Filled 2023-07-19 (×2): qty 2

## 2023-07-19 MED ORDER — OSMOLITE 1.2 CAL PO LIQD
237.0000 mL | Freq: Four times a day (QID) | ORAL | Status: DC
  Filled 2023-07-19: qty 237

## 2023-07-19 MED ORDER — OSMOLITE 1.5 CAL PO LIQD
237.0000 mL | Freq: Three times a day (TID) | ORAL | Status: DC
  Administered 2023-07-19 – 2023-07-20 (×3): 237 mL
  Filled 2023-07-19: qty 237

## 2023-07-19 MED ORDER — PERFLUTREN LIPID MICROSPHERE
1.0000 mL | INTRAVENOUS | Status: AC | PRN
  Administered 2023-07-19: 2 mL via INTRAVENOUS

## 2023-07-19 MED ORDER — VANCOMYCIN HCL 750 MG IV SOLR
750.0000 mg | Freq: Two times a day (BID) | INTRAVENOUS | Status: DC
  Administered 2023-07-19 – 2023-07-22 (×6): 750 mg via INTRAVENOUS
  Filled 2023-07-19 (×9): qty 15

## 2023-07-19 MED ORDER — PROSOURCE TF PO LIQD
45.0000 mL | Freq: Three times a day (TID) | ORAL | Status: DC
  Filled 2023-07-19 (×3): qty 45

## 2023-07-19 MED ORDER — PROSOURCE TF PO LIQD: 45.0000 mL | Freq: Three times a day (TID) | ORAL | Status: DC

## 2023-07-19 MED ORDER — VANCOMYCIN HCL 1750 MG/350ML IV SOLN
1750.0000 mg | Freq: Once | INTRAVENOUS | Status: AC
  Administered 2023-07-19: 1750 mg via INTRAVENOUS
  Filled 2023-07-19: qty 350

## 2023-07-19 MED ORDER — OXYCODONE HCL 5 MG PO TABS
5.0000 mg | ORAL_TABLET | Freq: Four times a day (QID) | ORAL | Status: DC | PRN
  Filled 2023-07-19: qty 1

## 2023-07-19 MED ORDER — VANCOMYCIN HCL IN DEXTROSE 1-5 GM/200ML-% IV SOLN
1000.0000 mg | Freq: Once | INTRAVENOUS | Status: AC
  Administered 2023-07-19: 1000 mg via INTRAVENOUS
  Filled 2023-07-19: qty 200

## 2023-07-19 NOTE — ED Provider Notes (Signed)
 [place] [facility] AT [facility] Provider Note  CSN: [id number] Arrival date & time: 07/18/23 1708  Chief Complaint(s) Leg Swelling  HPI Samuel [name] is a 78 y.o. 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  He reports she noted the redness 1 to 2 days ago.  He believes this is a possible reaction to socks that he was wearing.  Denies any falls or trauma.  Endorses to swelling and blisters with leaking from the edema.  He is compliant with his medication.  The history is provided by the patient.    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   Port-A-Cath in place 07/03/2023   Acute prerenal azotemia 05/23/2023   Esophageal cancer (HCC) 05/23/2023   Chronic diastolic CHF (congestive heart failure) (HCC) 05/23/2023   History of pulmonary embolism 05/23/2023   Generalized weakness 05/22/2023   Protein-calorie malnutrition, severe 04/22/2023   Malignant neoplasm of esophagus (HCC) 04/20/2023   AV block, 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 Authorizing Provider  apixaban  (ELIQUIS ) 5 MG TABS tablet Place 1 tablet (5 mg total) into feeding tube 2 (two) times daily. 06/22/23  Yes Newlin, Enobong, MD  famotidine  (PEPCID ) 40 MG/5ML suspension Take 5 mLs (40 mg total) by mouth daily. 07/03/23  Yes Boscia, Heather E, NP  Nutritional Supplements (OSMOLITE 1.5 CAL PO) Place 474 mLs into feeding tube 3 (three) times daily. Give via feeding tube towards the end of tube feeding; flush with (60ml) sterile water ; then give 1 packet of Prosource TF protein packet and flush with (60ml) sterile water . IF needed flush with additional (30ml) sterile water .   Yes [provider]  Nutritional Supplements (PROSOURCE TF PO) Place 1 packet into feeding tube with breakfast, with lunch, and with evening meal. Use with the last portion of tube feeding; Give via feeding tube towards the end of tube feeding; flush with (60ml) sterile water ; then give 1 packet of Prosource TF protein packet and flush with (60ml) sterile water . IF needed flush with additional (30ml) sterile water .   Yes [provider]  ondansetron  (ZOFRAN ) 4 MG tablet Take 1 tablet (4 mg total) by mouth every 8 (eight) hours as needed for nausea or vomiting. 07/03/23  Yes Boscia, Heather E, NP  PRESCRIPTION MEDICATION Apply 1 application  topically as needed (appy to feet and ankles). Sonafine Wound Dressing   Yes [provider]  dexamethasone  (DECADRON ) 4 MG tablet Take 2 tablets daily for 2 days, start the day after chemotherapy.  Take with food. Patient not taking: Reported on 07/19/2023 05/29/23   Boscia, Heather E, NP  doxycycline  (VIBRA -TABS) 100 MG tablet Take 1 tablet twice a day by oral route. Patient not taking: Reported on 07/19/2023 07/10/23   [provider]  Water  For Irrigation, Sterile (FREE WATER ) SOLN Place 120 mLs into feeding tube 6 (six) times daily.  04/25/23  Yes Haydee Lipa, MD                                                                                                                                    Allergies Penicillins, Sulfa antibiotics, Acetaminophen , and Procaine  Review of Systems Review of Systems As noted in HPI  Physical Exam Vital Signs  I have reviewed the triage vital signs BP 106/64   Pulse (!) 105   Temp 98.5 F (36.9 C) (Oral)   Resp 18   SpO2 96%   Physical Exam Vitals reviewed.  Constitutional:      General: He is not in acute distress.    Appearance: He is well-developed. He is not diaphoretic.  HENT:     Head: Normocephalic and atraumatic.     Right Ear: External ear normal.     Left Ear: External ear normal.     Nose: Nose normal.     Mouth/Throat:     Mouth: Mucous membranes are moist.  Eyes:     General: No scleral icterus.    Conjunctiva/sclera: Conjunctivae normal.  Neck:     Trachea: Phonation normal.  Cardiovascular:     Rate and Rhythm: Normal rate and regular rhythm.  Pulmonary:     Effort: Pulmonary effort is normal. No respiratory distress.     Breath sounds: No stridor.  Abdominal:     General: There is no distension.    Musculoskeletal:        General: Normal range of motion.     Cervical back: Normal range of motion.  Neurological:     Mental Status: He is alert and oriented to person, place, and time.  Psychiatric:        Behavior: Behavior normal.     ED Results and Treatments Labs (all labs ordered are listed, but only abnormal results are displayed) Labs Reviewed  COMPREHENSIVE METABOLIC PANEL - Abnormal; Notable for the following components:      Result Value   Glucose, Bld 124 (*)    BUN 30 (*)    Total Protein 6.1 (*)    Albumin 2.8 (*)    All other components within normal limits  CBC WITH DIFFERENTIAL/PLATELET - Abnormal; Notable for the following components:   WBC 12.1 (*)    RBC 4.18 (*)    RDW 18.9 (*)    Platelets 114 (*)     Neutro Abs 9.6 (*)    Lymphs Abs 0.4 (*)    Monocytes Absolute 1.5 (*)    Abs Immature Granulocytes 0.47 (*)  All other components within normal limits  BRAIN NATRIURETIC PEPTIDE - Abnormal; Notable for the following components:   B Natriuretic Peptide 123.2 (*)    All other components within normal limits  CULTURE, BLOOD (ROUTINE X 2)  CULTURE, BLOOD (ROUTINE X 2)  I-STAT CG4 LACTIC ACID, ED                                                                                                                         EKG  EKG Interpretation Date/Time:    Ventricular Rate:    PR Interval:    QRS Duration:    QT Interval:    QTC Calculation:   R Axis:      Text Interpretation:         Radiology VAS US  LOWER EXTREMITY VENOUS (DVT) (7a-7p) Result Date: 07/18/2023  Lower Venous DVT Study Patient Name:  BRENON PENNELL Schmoker  Date of Exam:   07/18/2023 Medical Rec #: [id number]           Accession #:    [id number] Date of Birth: September 20, 1945           Patient Gender: M Patient Age:   79 years Exam Location:  [facility] Procedure:      VAS US  LOWER EXTREMITY VENOUS (DVT) Referring Phys: MELANIE BELFI --------------------------------------------------------------------------------  Indications: Pain, Erythema, and Edema.  Risk Factors: PE & DVT 04/15/2023, esophageal cancer on chemotherapy. Anticoagulation: Eliquis . Limitations: Body habitus, poor ultrasound/tissue interface and pain intolerance. Comparison Study: Previous exam on 04/15/2023 was positive for DVT in BLE PTVs Performing Technologist: Arlyce Berger RVT, RDMS  Examination Guidelines: A complete evaluation includes B-mode imaging, spectral Doppler, color Doppler, and power Doppler as needed of all accessible portions of each vessel. Bilateral testing is considered an integral part of a complete examination. Limited examinations for reoccurring indications may be performed as noted. The reflux portion of the exam is performed with the patient  in reverse Trendelenburg.  +---------+---------------+---------+-----------+----------+-------------------+ RIGHT    CompressibilityPhasicitySpontaneityPropertiesThrombus Aging      +---------+---------------+---------+-----------+----------+-------------------+ CFV      Full           Yes      Yes                                      +---------+---------------+---------+-----------+----------+-------------------+ SFJ      Full                                                             +---------+---------------+---------+-----------+----------+-------------------+ FV Prox  Full           Yes      Yes                                      +---------+---------------+---------+-----------+----------+-------------------+  FV Mid   Full           Yes      Yes                                      +---------+---------------+---------+-----------+----------+-------------------+ FV DistalFull           Yes      Yes                                      +---------+---------------+---------+-----------+----------+-------------------+ PFV      Full                                                             +---------+---------------+---------+-----------+----------+-------------------+ POP      Full           Yes      Yes                                      +---------+---------------+---------+-----------+----------+-------------------+ PTV      Full                                                             +---------+---------------+---------+-----------+----------+-------------------+ PERO                                                  Not well visualized +---------+---------------+---------+-----------+----------+-------------------+   +---------+---------------+---------+-----------+----------+------------------+ LEFT     CompressibilityPhasicitySpontaneityPropertiesThrombus Aging      +---------+---------------+---------+-----------+----------+------------------+ CFV      Full           Yes      Yes                                     +---------+---------------+---------+-----------+----------+------------------+ SFJ      Full                                                            +---------+---------------+---------+-----------+----------+------------------+ FV Prox  Full           No       Yes                                     +---------+---------------+---------+-----------+----------+------------------+ FV Mid   Full           Yes      Yes                                     +---------+---------------+---------+-----------+----------+------------------+  FV DistalFull           Yes      Yes                                     +---------+---------------+---------+-----------+----------+------------------+ PFV      Full                                                            +---------+---------------+---------+-----------+----------+------------------+ POP      Full           No       Yes                                     +---------+---------------+---------+-----------+----------+------------------+ PTV      None           No       No                   Chronic            +---------+---------------+---------+-----------+----------+------------------+ PERO                                                  unable to                                                                visualized         +---------+---------------+---------+-----------+----------+------------------+     Summary: BILATERAL: -No evidence of popliteal cyst, bilaterally. RIGHT: - There is no evidence of deep vein thrombosis in the lower extremity.  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   *See table(s) above for measurements and observations. Electronically signed by Delaney Fearing  on 07/18/2023 at 9:25:45 PM.    Final    DG Chest 2 View Result Date: 07/18/2023 CLINICAL DATA:  leg swelling EXAM: CHEST - 2 VIEW COMPARISON:  Chest x-ray 05/22/2023, CT chest 04/14/2023 FINDINGS: The heart and mediastinal contours are unchanged. Atherosclerotic plaque. Elevated left hemidiaphragm. Left mid lung zone linear atelectasis versus scarring. No focal consolidation. No pulmonary edema. No pleural effusion. No pneumothorax. No acute osseous abnormality. Degenerative changes of the left shoulder. IMPRESSION: 1. No active cardiopulmonary disease. 2.  Aortic Atherosclerosis (ICD10-I70.0). Electronically Signed   By: Morgane  Naveau M.D.   On: 07/18/2023 20:28   DG Foot 2 Views Left Result Date: 07/18/2023 CLINICAL DATA:  Bilateral foot swelling, redness. EXAM: LEFT FOOT - 2 VIEW COMPARISON:  None Available. FINDINGS: Marked soft tissue swelling, especially dorsally on the lateral view. No acute fracture or dislocation. Tiny calcaneal spur. No soft tissue gas. No radiopaque foreign object. No osseous destruction. IMPRESSION: Soft tissue swelling, without acute osseous finding. Electronically Signed   By: Lore Rode  M.D.   On: 07/18/2023 20:25   DG Foot 2 Views Right Result Date: 07/18/2023 CLINICAL DATA:  Swelling of both lower extremities. Redness. Oozing blisters. EXAM: RIGHT FOOT - 2 VIEW COMPARISON:  None Available. FINDINGS: There is diffuse decreased bone mineralization. Mild great toe metatarsophalangeal joint space narrowing and lateral osteophytosis. Mild-to-moderate plantar calcaneal heel spur. Large body habitus with prominent dorsal foot soft tissues, symmetric to the contralateral foot imaged the same day. This likely includes soft tissue swelling and edema. No definite soft tissue ulcer is identified. No cortical erosion is seen. IMPRESSION: 1. Mild great toe metatarsophalangeal joint osteoarthritis. 2. Mild-to-moderate plantar calcaneal heel spur. 3. No radiographic evidence of  osteomyelitis. Electronically Signed   By: Bertina Broccoli M.D.   On: 07/18/2023 19:31    Medications Ordered in ED Medications  oxyCODONE -acetaminophen  (PERCOCET/ROXICET) 5-325 MG per tablet 1 tablet (1 tablet Oral Given 07/18/23 2029)  ceFEPIme  (MAXIPIME ) 2 g in sodium chloride  0.9 % 100 mL IVPB (0 g Intravenous Stopped 07/19/23 0635)  vancomycin  (VANCOREADY) IVPB 1750 mg/350 mL (1,750 mg Intravenous New Bag/Given 07/19/23 0514)   Procedures Procedures  (including critical care time) Medical Decision Making / ED Course   Medical Decision Making Amount and/or Complexity of Data Reviewed Labs: ordered. Decision-making details documented in ED Course. Radiology: ordered and independent interpretation performed. Decision-making details documented in ED Course.  Risk Prescription drug management. Decision regarding hospitalization.    Bilateral lower extremity edema and erythema.  He has leukocytosis and exam is concerning for cellulitis.  Requires IV antibiotics.  Not concerning for necrotizing fasciitis.  X-ray without subcutaneous gas.  Ultrasounds negative for acute DVT.  Stable posterior tibial chronic DVT noted.  Case discussed with hospitalist service who agreed to admit patient for further workup and management.      Final Clinical Impression(s) / ED Diagnoses Final diagnoses:  Cellulitis of right lower extremity  Cellulitis of left lower extremity    This chart was dictated using voice recognition software.  Despite best efforts to proofread,  errors can occur which can change the documentation meaning.    Lindle Rhea, MD 07/19/23 [phone]

## 2023-07-19 NOTE — ED Notes (Signed)
Wound care to bedside.

## 2023-07-19 NOTE — Telephone Encounter (Signed)
 Patient is calling to alert office he is at hospital and awaiting admission. Patient states he has not had medications, food and needs assistance to bathroom. Patient advised I would call his charge nurse and let them know he is requesting assistance.  Call to ED- spoke to nurse- they will follow up with patient- state they just talked with him- but will go back and speak to him again.

## 2023-07-19 NOTE — Consult Note (Signed)
 WOC Nurse Consult Note:patient denies history of ulceration/weeping to lower legs; states he has edema but no open wounds; has never received wound care or seen vascular surgeon per patient; patient believes this is an allergic reaction  Reason for Consult: B lower extremity cellulitis and oozing  Wound type: scattered partial and full  thickness r/t cellulitis, ruptured blister  Pressure Injury POA: NA  Measurement: patient with edema, erythema and scattered areas of partial thickness skin loss, has a ruptured blister L dorsal foot measuring 2 cm x 5 cm x 0.1 cm; partial thickness skin loss L posterior leg 3 cm x 5 cm and R posterior leg 6 cm x 8 cm  Wound bed: pink moist  Drainage (amount, consistency, odor) yellow serous  Periwound: significant erythema and edema, being treated for cellulitis  Dressing procedure/placement/frequency: Cleanse bilateral lower legs with Vashe wound cleanser Timm Foot [id number]), apply calcium alginate ([facility] #[id number]) to wound beds and any weeping areas.  Cover anterior and posterior legs with ABD pads, secure with Kerlix roll gauze beginning just above toes and ending right below knees. Secure entire dressing with Ace bandage wrapped in same fashion as Kerlix for some light compression.    POC discussed with patient and bedside nurse. WOC team will not follow. Re-consult if further needs arise.   Thank you,    Ronni Colace MSN, RN-BC, [employer] [phone]

## 2023-07-19 NOTE — Progress Notes (Signed)
 Pharmacy Antibiotic Note  Samuel [name] is a 78 y.o. male admitted on 07/18/2023 with cellulitis.  Pharmacy has been consulted for vancomycin  dosing.  Plan: Vancomycin  1000mg  x1 in ED then 750mg  IV q12 hours (eAUC 473, Vd0.72, scr 0.89) Monitor for clinical progression and vancomycin  levels as appropriate     Temp (24hrs), Avg:98.2 F (36.8 C), Min:97.9 F (36.6 C), Max:98.5 F (36.9 C)  Recent Labs  Lab 07/18/23 2010 07/18/23 2159  WBC 12.1*  --   CREATININE 0.89  --   LATICACIDVEN  --  1.6    Estimated Creatinine Clearance: 68.3 mL/min (by C-G formula based on SCr of 0.89 mg/dL).    Allergies  Allergen Reactions   Penicillins     Shortness of breath   Sulfa Antibiotics     Blisters    Acetaminophen  Other (See Comments)    Caused problems with urinary tract   Procaine Other (See Comments)    Caused fatigue    Antimicrobials this admission: Vancomycin  1/15>>  Thank you for allowing pharmacy to be a part of this patient's care.  Angelo Kennedy Juell Radney 07/19/2023 10:17 AM

## 2023-07-19 NOTE — Progress Notes (Signed)
  Echocardiogram 2D Echocardiogram has been performed.  Samuel [name] 07/19/2023, 3:54 PM

## 2023-07-19 NOTE — H&P (Signed)
 [facility] History and Physical  Samuel [name] NUU:[id number] DOB: [birth year] DOA: 07/18/2023 PCP: Joaquin Mulberry, MD  Presented from: Home Chief Complaint: Bilateral lower extremity swelling and redness  History of Present Illness: Samuel [name] is a 78 y.o. 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 1/14, patient presented to the ED with complaint of progressively worsening redness and swelling of both lower extremities for 3 days. Patient reports that at baseline he has only mild b/l lower extremity swelling.  He tried a new pair of socks 3 days ago after which he noted worsening swelling and redness.  He noted draining and oozing soaking his linens and hence presented to the ED. Denies any fever.  Reports compliance to Eliquis .  Not on any diuretics at home. He denies prior episodes of cellulitis.  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 In October 2024, he was hospitalized with progressive dysphagia and weight loss.  EGD showed a malignant mass, biopsy proved adenocarcinoma.  PEG tube was inserted and has been on PEG tube feeding since then.  Cardiothoracic surgery deemed him not a candidate for surgery.  He was seen by oncology Dr. Maryalice Smaller as an outpatient and was started on chemoradiation.  In December he completed first cycle of chemotherapy and was started on radiation under Dr. Jeryl Moris.   During his hospitalization in October, he was also found to have acute PE and bilateral lower extremity DVT.  He was started on Eliquis  which he has been compliant with.  In the ED, patient was afebrile, hemodynamically stable Labs showed WBC count 12.1, hemoglobin 13.3, platelet 114, lactic acid 1.6, BUN/creatinine 30/0.89, BNP 123 Chest x-ray unremarkable Bilateral foot x-ray with soft tissue swelling, no bony involvement Blood culture were sent Patient was started on broad-spectrum IV  antibiotics Ultrasound duplex was ordered Hospitalist service consulted for inpatient management  I received this patient as a carryover admission from last night At the time of my evaluation, patient was lying down in ED bed on the hallway.  Very pleasant elderly male. History reviewed and detailed as above. Review of Systems:  All systems were reviewed and were negative unless otherwise mentioned in the HPI   Past medical history: Past Medical History:  Diagnosis Date   Diabetes mellitus without complication (HCC)    Esophageal cancer (HCC)    Pulmonary embolism (HCC)    In October 2024    Past surgical history: Past Surgical History:  Procedure Laterality Date   BIOPSY  04/18/2023   Procedure: BIOPSY;  Surgeon: Renaye Carp, DO;  Location: [facility] ENDOSCOPY;  Service: Gastroenterology;;   ESOPHAGOGASTRODUODENOSCOPY N/A 04/18/2023   Procedure: ESOPHAGOGASTRODUODENOSCOPY (EGD);  Surgeon: Renaye Carp, DO;  Location: [facility] ENDOSCOPY;  Service: Gastroenterology;  Laterality: N/A;   IR GASTROSTOMY TUBE MOD SED  04/20/2023   IR NASO G TUBE PLC W/FL W/RAD  04/20/2023   KNEE ARTHROPLASTY Bilateral     Social History:  reports that he quit smoking about 38 years ago. His smoking use included cigarettes. He started smoking about 53 years ago. He has a 15 pack-year smoking history. He has never used smokeless tobacco. He reports current alcohol  use of about 1.0 - 2.0 standard drink of alcohol  per week. He reports that he does not use drugs.  Allergies:  Allergies  Allergen Reactions   Penicillins     Shortness of breath   Sulfa Antibiotics     Blisters  Acetaminophen  Other (See Comments)    Caused problems with urinary tract   Procaine Other (See Comments)    Caused fatigue   Penicillins, Sulfa antibiotics, Acetaminophen , and Procaine   Family history:  Family History  Problem Relation Age of Onset   Hypertension Father    COPD Father    COPD Sister    Diabetes  Maternal Grandfather      Physical Exam: Vitals:   07/18/23 1659 07/18/23 1713 07/19/23 0316  BP:  (!) 134/98 106/64  Pulse:  87 (!) 105  Resp:  17 18  Temp:  98.3 F (36.8 C) 98.5 F (36.9 C)  TempSrc:   Oral  SpO2: 97% 97% 96%   Wt Readings from Last 3 Encounters:  07/11/23 78 kg  07/03/23 78.7 kg  06/26/23 80.4 kg   There is no height or weight on file to calculate BMI.  General exam: Pleasant, elderly Caucasian male. Skin: No rashes, lesions or ulcers. HEENT: Atraumatic, normocephalic, no obvious bleeding Lungs: Clear to auscultation bilaterally,  CVS: S1, S2, no murmur,   GI/Abd: Soft, nontender, nondistended, bowel sound present.  PEG tube site intact CNS: Alert, awake, oriented x 3 Psychiatry: Mood appropriate, Extremities: Bilateral lower extremity swelling, redness up to knee.  1 big blister noted on dorsum of left foot.  Chronic onychomycosis present.  Significant soakage noted in linens.        ----------------------------------------------------------------------------------------------------------------------------------------- ----------------------------------------------------------------------------------------------------------------------------------------- -----------------------------------------------------------------------------------------------------------------------------------------  Assessment/Plan: Principal Problem:   Cellulitis  Bilateral lower extremity cellulitis with purulence Presented with acute swelling and redness of both lower extremities.  Patient states symptoms started 3 days ago after buying new pair of socks and he believes this is an allergy response. However on exam, I see he has chronic stasis changes and onychomycosis as well. He had DVT in October 2024 and also had mild systolic CHF. Cellulitis order set utilized.  IV vancomycin  started. Blood culture was sent from ED earlier. Wound care consult in. 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 Continue Eliquis .  Repeat ultrasound duplex  H/o mild systolic CHF Echo from October 2024 showed EF of 50 to 55% and grade 1 diastolic function. Repeat echo  Type 2 diabetes mellitus A1c 6.4 in November 2024 Not on meds  Esophageal cancer  s/p PEG tube In October 2024, he was hospitalized with progressive dysphagia and weight loss.  EGD showed a malignant mass, biopsy proved adenocarcinoma.  PEG tube was inserted and has been on PEG tube feeding since then.  Cardiothoracic surgery deemed him not a candidate for surgery.  He was seen by oncology Dr. Maryalice Smaller as an outpatient and was started on chemoradiation.  In December he completed first cycle of chemotherapy and was started on radiation under Dr. Jeryl Moris.   Tube feeding reordered Dietitian to see  Mobility: Independent at baseline.  Encourage ambulation  Goals of care:   Code Status: Full Code    DVT prophylaxis: Eliquis  to continue    Antimicrobials: IV vancomycin  Fluid: None Consultants: None so far Family Communication: None at bedside  Dispo: The patient is from: Patient works as a live-in caregiver for a couple              Anticipated d/c is to: Hopefully back home  Diet: Diet Order  None        ------------------------------------------------------------------------------------- Severity of Illness: The appropriate patient status for this patient is INPATIENT. Inpatient status is judged to be reasonable and necessary in order to provide the required intensity of service to ensure the patient's safety. The patient's presenting symptoms, physical exam findings, and initial radiographic and laboratory data in the context of their chronic comorbidities is felt to place them at  high risk for further clinical deterioration. Furthermore, it is not anticipated that the patient will be medically stable for discharge from the hospital within 2 midnights of admission.   * I certify that at the point of admission it is my clinical judgment that the patient will require inpatient hospital care spanning beyond 2 midnights from the point of admission due to high intensity of service, high risk for further deterioration and high frequency of surveillance required.* -------------------------------------------------------------------------------------   Home Meds: Prior to Admission medications   Medication Sig Start Date End Date Taking? Authorizing Provider  apixaban  (ELIQUIS ) 5 MG TABS tablet Place 1 tablet (5 mg total) into feeding tube 2 (two) times daily. 06/22/23  Yes Newlin, Enobong, MD  famotidine  (PEPCID ) 40 MG/5ML suspension Take 5 mLs (40 mg total) by mouth daily. 07/03/23  Yes Boscia, Heather E, NP  Nutritional Supplements (OSMOLITE 1.5 CAL PO) Place 474 mLs into feeding tube 3 (three) times daily. Give via feeding tube towards the end of tube feeding; flush with (60ml) sterile water ; then give 1 packet of Prosource TF protein packet and flush with (60ml) sterile water . IF needed flush with additional (30ml) sterile water .   Yes [provider]  Nutritional Supplements (PROSOURCE TF PO) Place 1 packet into feeding tube with breakfast, with lunch, and with evening meal. Use with the last portion of tube feeding; Give via feeding tube towards the end of tube feeding; flush with (60ml) sterile water ; then give 1 packet of Prosource TF protein packet and flush with (60ml) sterile water . IF needed flush with additional (30ml) sterile water .   Yes [provider]  ondansetron  (ZOFRAN ) 4 MG tablet Take 1 tablet (4 mg total) by mouth every 8 (eight) hours as needed for nausea or vomiting. 07/03/23  Yes Boscia, Heather E, NP  PRESCRIPTION MEDICATION Apply  1 application  topically as needed (appy to feet and ankles). Sonafine Wound Dressing   Yes [provider]  Water  For Irrigation, Sterile (FREE WATER ) SOLN Place 120 mLs into feeding tube 6 (six) times daily. 04/25/23  Yes Haydee Lipa, MD  dexamethasone  (DECADRON ) 4 MG tablet Take 2 tablets daily for 2 days, start the day after chemotherapy. Take with food. Patient not taking: Reported on 07/19/2023 05/29/23   Boscia, Heather E, NP  doxycycline  (VIBRA -TABS) 100 MG tablet Take 1 tablet twice a day by oral route. Patient not taking: Reported on 07/19/2023 07/10/23   [provider]    Labs on Admission:   CBC: Recent Labs  Lab 07/18/23 2010  WBC 12.1*  NEUTROABS 9.6*  HGB 13.3  HCT 39.5  MCV 94.5  PLT 114*    Basic Metabolic Panel: Recent Labs  Lab 07/18/23 2010  NA 135  K 4.6  CL 100  CO2 23  GLUCOSE 124*  BUN 30*  CREATININE 0.89  CALCIUM 9.0    Liver Function Tests: Recent Labs  Lab 07/18/23 2010  AST 29  ALT 20  ALKPHOS 91  BILITOT 0.9  PROT 6.1*  ALBUMIN 2.8*   No results for input(s): "LIPASE", "  AMYLASE" in the last 168 hours. No results for input(s): "AMMONIA" in the last 168 hours.  Cardiac Enzymes: No results for input(s): "CKTOTAL", "CKMB", "CKMBINDEX", "TROPONINI" in the last 168 hours.  BNP (last 3 results) Recent Labs    07/18/23 2010  BNP 123.2*    ProBNP (last 3 results) No results for input(s): "PROBNP" in the last 8760 hours.  CBG: No results for input(s): "GLUCAP" in the last 168 hours.  Lipase     Component Value Date/Time   LIPASE 28 05/22/2023 1756     Urinalysis    Component Value Date/Time   COLORURINE YELLOW 05/22/2023 2113   APPEARANCEUR CLEAR 05/22/2023 2113   LABSPEC >1.046 (H) 05/22/2023 2113   PHURINE 7.0 05/22/2023 2113   GLUCOSEU NEGATIVE 05/22/2023 2113   HGBUR NEGATIVE 05/22/2023 2113   BILIRUBINUR NEGATIVE 05/22/2023 2113   KETONESUR NEGATIVE 05/22/2023 2113   PROTEINUR NEGATIVE  05/22/2023 2113   NITRITE NEGATIVE 05/22/2023 2113   LEUKOCYTESUR NEGATIVE 05/22/2023 2113     Drugs of Abuse  No results found for: "LABOPIA", "COCAINSCRNUR", "LABBENZ", "AMPHETMU", "THCU", "LABBARB"    Radiological Exams on Admission: VAS US  LOWER EXTREMITY VENOUS (DVT) (7a-7p) Result Date: 07/18/2023  Lower Venous DVT Study Patient Name:  DARIEON SCHAAL Metzinger  Date of Exam:   07/18/2023 Medical Rec #: [id number]           Accession #:    [id number] Date of Birth: 02/11/1946           Patient Gender: M Patient Age:   89 years Exam Location:  [facility] Procedure:      VAS US  LOWER EXTREMITY VENOUS (DVT) Referring Phys: MELANIE BELFI --------------------------------------------------------------------------------  Indications: Pain, Erythema, and Edema.  Risk Factors: PE & DVT 04/15/2023, esophageal cancer on chemotherapy. Anticoagulation: Eliquis . Limitations: Body habitus, poor ultrasound/tissue interface and pain intolerance. Comparison Study: Previous exam on 04/15/2023 was positive for DVT in BLE PTVs Performing Technologist: Arlyce Berger RVT, RDMS  Examination Guidelines: A complete evaluation includes B-mode imaging, spectral Doppler, color Doppler, and power Doppler as needed of all accessible portions of each vessel. Bilateral testing is considered an integral part of a complete examination. Limited examinations for reoccurring indications may be performed as noted. The reflux portion of the exam is performed with the patient in reverse Trendelenburg.  +---------+---------------+---------+-----------+----------+-------------------+ RIGHT    CompressibilityPhasicitySpontaneityPropertiesThrombus Aging      +---------+---------------+---------+-----------+----------+-------------------+ CFV      Full           Yes      Yes                                      +---------+---------------+---------+-----------+----------+-------------------+ SFJ      Full                                                              +---------+---------------+---------+-----------+----------+-------------------+ FV Prox  Full           Yes      Yes                                      +---------+---------------+---------+-----------+----------+-------------------+  FV Mid   Full           Yes      Yes                                      +---------+---------------+---------+-----------+----------+-------------------+ FV DistalFull           Yes      Yes                                      +---------+---------------+---------+-----------+----------+-------------------+ PFV      Full                                                             +---------+---------------+---------+-----------+----------+-------------------+ POP      Full           Yes      Yes                                      +---------+---------------+---------+-----------+----------+-------------------+ PTV      Full                                                             +---------+---------------+---------+-----------+----------+-------------------+ PERO                                                  Not well visualized +---------+---------------+---------+-----------+----------+-------------------+   +---------+---------------+---------+-----------+----------+------------------+ LEFT     CompressibilityPhasicitySpontaneityPropertiesThrombus Aging     +---------+---------------+---------+-----------+----------+------------------+ CFV      Full           Yes      Yes                                     +---------+---------------+---------+-----------+----------+------------------+ SFJ      Full                                                            +---------+---------------+---------+-----------+----------+------------------+ FV Prox  Full           No       Yes                                      +---------+---------------+---------+-----------+----------+------------------+ FV Mid   Full           Yes      Yes                                     +---------+---------------+---------+-----------+----------+------------------+  FV DistalFull           Yes      Yes                                     +---------+---------------+---------+-----------+----------+------------------+ PFV      Full                                                            +---------+---------------+---------+-----------+----------+------------------+ POP      Full           No       Yes                                     +---------+---------------+---------+-----------+----------+------------------+ PTV      None           No       No                   Chronic            +---------+---------------+---------+-----------+----------+------------------+ PERO                                                  unable to                                                                visualized         +---------+---------------+---------+-----------+----------+------------------+     Summary: BILATERAL: -No evidence of popliteal cyst, bilaterally. RIGHT: - There is no evidence of deep vein thrombosis in the lower extremity.  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   *See table(s) above for measurements and observations. Electronically signed by Delaney Fearing on 07/18/2023 at 9:25:45 PM.    Final    DG Chest 2 View Result Date: 07/18/2023 CLINICAL DATA:  leg swelling EXAM: CHEST - 2 VIEW COMPARISON:  Chest x-ray 05/22/2023, CT chest 04/14/2023 FINDINGS: The heart and mediastinal contours are unchanged. Atherosclerotic plaque. Elevated left hemidiaphragm. Left mid lung zone linear atelectasis versus scarring. No focal consolidation. No pulmonary edema. No pleural effusion. No pneumothorax. No acute osseous abnormality.  Degenerative changes of the left shoulder. IMPRESSION: 1. No active cardiopulmonary disease. 2.  Aortic Atherosclerosis (ICD10-I70.0). Electronically Signed   By: Morgane  Naveau M.D.   On: 07/18/2023 20:28   DG Foot 2 Views Left Result Date: 07/18/2023 CLINICAL DATA:  Bilateral foot swelling, redness. EXAM: LEFT FOOT - 2 VIEW COMPARISON:  None Available. FINDINGS: Marked soft tissue swelling, especially dorsally on the lateral view. No acute fracture or dislocation. Tiny calcaneal spur. No soft tissue gas. No radiopaque foreign object. No osseous destruction. IMPRESSION: Soft tissue swelling, without acute osseous finding. Electronically Signed   By: Lore Rode  M.D.   On: 07/18/2023 20:25   DG Foot 2 Views Right Result Date: 07/18/2023 CLINICAL DATA:  Swelling of both lower extremities. Redness. Oozing blisters. EXAM: RIGHT FOOT - 2 VIEW COMPARISON:  None Available. FINDINGS: There is diffuse decreased bone mineralization. Mild great toe metatarsophalangeal joint space narrowing and lateral osteophytosis. Mild-to-moderate plantar calcaneal heel spur. Large body habitus with prominent dorsal foot soft tissues, symmetric to the contralateral foot imaged the same day. This likely includes soft tissue swelling and edema. No definite soft tissue ulcer is identified. No cortical erosion is seen. IMPRESSION: 1. Mild great toe metatarsophalangeal joint osteoarthritis. 2. Mild-to-moderate plantar calcaneal heel spur. 3. No radiographic evidence of osteomyelitis. Electronically Signed   By: Bertina Broccoli M.D.   On: 07/18/2023 19:31     Signed, Hoyt Macleod, MD [facility] 07/19/2023

## 2023-07-19 NOTE — ED Notes (Signed)
 This RN to bedside, pt expressing frustration over being in a hallway room. This RN to provide encouragement. Pt currently refusing to allow this RN to give scheduled tube feeding, informs this RN he wants that when he goes to a room. This RN assured pt that charge RN was aware and actively working on pt to move to an ED room.

## 2023-07-19 NOTE — ED Notes (Signed)
 ED TO INPATIENT HANDOFF REPORT  ED Nurse Name and Phone #: Ouida Bloom [phone]  S Name/Age/Gender Samuel [name] 78 y.o. male Room/Bed: 046C/046C  Code Status   Code Status: Full Code  Home/SNF/Other Home Patient oriented to: self, place, time, and situation Is this baseline? Yes   Triage Complete: Triage complete  Chief Complaint Cellulitis [L03.90]  Triage Note Pt [facility] via [facility] from [facility]. Pt reports severe swelling and redness to bilateral feet and ankles that started 1 day ago. Pt is concerned he might be having a reaction to socks that he was wearing. Pt is AxOx4.    Allergies Allergies  Allergen Reactions   Penicillins     Shortness of breath   Sulfa Antibiotics     Blisters    Acetaminophen  Other (See Comments)    Caused problems with urinary tract   Procaine Other (See Comments)    Caused fatigue    Level of Care/Admitting Diagnosis ED Disposition     ED Disposition  Admit   Condition  --   Comment  Hospital Area: [facility] [100100]  Level of Care: Telemetry Medical [104]  May admit patient to [facility] or [facility] if equivalent level of care is available:: No  Covid Evaluation: Asymptomatic - no recent exposure (last 10 days) testing not required  Diagnosis: Cellulitis [960454]  Admitting Physician: Sabas Cradle [[id number]]  Attending Physician: Sabas Cradle [[id number]]  Certification:: I certify this patient will need inpatient services for at least 2 midnights  Expected Medical Readiness: 07/24/2023          B Medical/Surgery History Past Medical History:  Diagnosis Date   Diabetes mellitus without complication (HCC)    Esophageal cancer (HCC)    Pulmonary embolism (HCC)    In October 2024   Past Surgical History:  Procedure Laterality Date   BIOPSY  04/18/2023   Procedure: BIOPSY;  Surgeon: Renaye Carp, DO;  Location: [facility] ENDOSCOPY;  Service: Gastroenterology;;   ESOPHAGOGASTRODUODENOSCOPY  N/A 04/18/2023   Procedure: ESOPHAGOGASTRODUODENOSCOPY (EGD);  Surgeon: Renaye Carp, DO;  Location: [facility] ENDOSCOPY;  Service: Gastroenterology;  Laterality: N/A;   IR GASTROSTOMY TUBE MOD SED  04/20/2023   IR NASO G TUBE PLC W/FL W/RAD  04/20/2023   KNEE ARTHROPLASTY Bilateral      A IV Location/Drains/Wounds Patient Lines/Drains/Airways Status     Active Line/Drains/Airways     Name Placement date Placement time Site Days   Peripheral IV 07/18/23 22 G 1" Left Antecubital 07/18/23  2023  Antecubital  1   Gastrostomy/Enterostomy Percutaneous endoscopic gastrostomy (PEG) 18 Fr. LUQ 05/23/23  2130  LUQ  57            Intake/Output Last 24 hours  Intake/Output Summary (Last 24 hours) at 07/19/2023 1935 Last data filed at 07/19/2023 2130 Gross per 24 hour  Intake 100 ml  Output --  Net 100 ml    Labs/Imaging Results for orders placed or performed during the hospital encounter of 07/18/23 (from the past 48 hours)  Culture, blood (routine x 2)     Status: None (Preliminary result)   Collection Time: 07/18/23  5:58 PM   Specimen: BLOOD  Result Value Ref Range   Specimen Description BLOOD LEFT ANTECUBITAL    Special Requests      BOTTLES DRAWN AEROBIC AND ANAEROBIC Blood Culture adequate volume   Culture      NO GROWTH < 12 HOURS Performed at [facility], [address],  [place], [place] [address]    Report Status PENDING   Culture, blood (routine x 2)     Status: None (Preliminary result)   Collection Time: 07/18/23  6:03 PM   Specimen: BLOOD  Result Value Ref Range   Specimen Description BLOOD RIGHT ANTECUBITAL    Special Requests      BOTTLES DRAWN AEROBIC AND ANAEROBIC Blood Culture adequate volume   Culture      NO GROWTH < 12 HOURS Performed at [facility], [address] [place], [place] [address]    Report Status PENDING   Comprehensive metabolic panel     Status: Abnormal   Collection Time: 07/18/23  8:10 PM  Result Value Ref Range    Sodium 135 135 - 145 mmol/L   Potassium 4.6 3.5 - 5.1 mmol/L   Chloride 100 98 - 111 mmol/L   CO2 23 22 - 32 mmol/L   Glucose, Bld 124 (H) 70 - 99 mg/dL    Comment: Glucose reference range applies only to samples taken after fasting for at least 8 hours.   BUN 30 (H) 8 - 23 mg/dL   Creatinine, Ser 4.69 0.61 - 1.24 mg/dL   Calcium 9.0 8.9 - 62.9 mg/dL   Total Protein 6.1 (L) 6.5 - 8.1 g/dL   Albumin 2.8 (L) 3.5 - 5.0 g/dL   AST 29 15 - 41 U/L   ALT 20 0 - 44 U/L   Alkaline Phosphatase 91 38 - 126 U/L   Total Bilirubin 0.9 0.0 - 1.2 mg/dL   GFR, Estimated >52 >84 mL/min    Comment: (NOTE) Calculated using the CKD-EPI Creatinine Equation (2021)    Anion gap 12 5 - 15    Comment: Performed at [facility], [address], [place], [place] [address]  CBC with Differential     Status: Abnormal   Collection Time: 07/18/23  8:10 PM  Result Value Ref Range   WBC 12.1 (H) 4.0 - 10.5 K/uL   RBC 4.18 (L) 4.22 - 5.81 MIL/uL   Hemoglobin 13.3 13.0 - 17.0 g/dL   HCT 01.0 27.2 - 53.6 %   MCV 94.5 80.0 - 100.0 fL   MCH 31.8 26.0 - 34.0 pg   MCHC 33.7 30.0 - 36.0 g/dL   RDW 64.4 (H) 03.4 - 74.2 %   Platelets 114 (L) 150 - 400 K/uL    Comment: SPECIMEN CHECKED FOR CLOTS REPEATED TO VERIFY PLATELET COUNT CONFIRMED BY SMEAR    nRBC 0.2 0.0 - 0.2 %   Neutrophils Relative % 79 %   Neutro Abs 9.6 (H) 1.7 - 7.7 K/uL   Lymphocytes Relative 3 %   Lymphs Abs 0.4 (L) 0.7 - 4.0 K/uL   Monocytes Relative 13 %   Monocytes Absolute 1.5 (H) 0.1 - 1.0 K/uL   Eosinophils Relative 0 %   Eosinophils Absolute 0.0 0.0 - 0.5 K/uL   Basophils Relative 1 %   Basophils Absolute 0.1 0.0 - 0.1 K/uL   Immature Granulocytes 4 %   Abs Immature Granulocytes 0.47 (H) 0.00 - 0.07 K/uL    Comment: Performed at [facility], [address] [place], [place] [address]  Brain natriuretic peptide     Status: Abnormal   Collection Time: 07/18/23  8:10 PM  Result Value Ref Range   B Natriuretic Peptide 123.2  (H) 0.0 - 100.0 pg/mL    Comment: Performed at [facility], [address] [place], [place] [address]  I-Stat  Lactic Acid     Status: None   Collection Time: 07/18/23  9:59 PM  Result Value Ref Range   Lactic Acid, Venous 1.6 0.5 - 1.9 mmol/L  CBG monitoring, ED     Status: Abnormal   Collection Time: 07/19/23  1:19 PM  Result Value Ref Range   Glucose-Capillary 100 (H) 70 - 99 mg/dL    Comment: Glucose reference range applies only to samples taken after fasting for at least 8 hours.  CBG monitoring, ED     Status: Abnormal   Collection Time: 07/19/23  5:02 PM  Result Value Ref Range   Glucose-Capillary 116 (H) 70 - 99 mg/dL    Comment: Glucose reference range applies only to samples taken after fasting for at least 8 hours.   ECHOCARDIOGRAM COMPLETE Result Date: 07/19/2023    ECHOCARDIOGRAM REPORT   Patient Name:   Samuel [name] Date of Exam: 07/19/2023 Medical Rec #:  [id number]          Height:       66.0 in Accession #:    [id number]         Weight:       172.0 lb Date of Birth:  02-19-1946          BSA:          1.876 m Patient Age:    77 years           BP:           108/73 mmHg Patient Gender: M                  HR:           102 bpm. Exam Location:  Inpatient Procedure: 2D Echo, Cardiac Doppler, Color Doppler and Intracardiac            Opacification Agent Indications:    R94.31 Abnormal EKG  History:        Patient has prior history of Echocardiogram examinations, most                 recent 04/15/2023. CHF, Abnormal ECG, Arrythmias:Bradycardia;                 Risk Factors:Hypertension and Diabetes. Esophageal cancer.                 Chemo.  Sonographer:    Raynelle Callow RDCS Referring Phys: [id number] [facility]  Sonographer Comments: Technically difficult study due to poor echo windows. Patient has feeding tube in SubC region. Attempted to turn. IMPRESSIONS  1. Left ventricular ejection fraction, by estimation, is 40 to 45%. The left ventricle has mildly decreased function. The  left ventricle demonstrates global hypokinesis. There is mild left ventricular hypertrophy. Left ventricular diastolic parameters are consistent with Grade I diastolic dysfunction (impaired relaxation).  2. Right ventricular systolic function is normal. The right ventricular size is normal. Tricuspid regurgitation signal is inadequate for assessing PA pressure.  3. The mitral valve is normal in structure. Trivial mitral valve regurgitation.  4. The aortic valve is tricuspid. Aortic valve regurgitation is mild to moderate. No aortic stenosis is present.  5. Aortic dilatation noted. There is mild dilatation of the ascending aorta, measuring 39 mm.  6. The inferior vena cava is normal in size with greater than 50% respiratory variability, suggesting right atrial pressure of 3 mmHg. FINDINGS  Left Ventricle: Left ventricular ejection fraction, by estimation, is 40 to 45%. The left ventricle has mildly decreased function. The  left ventricle demonstrates global hypokinesis. Definity  contrast agent was given IV to delineate the left ventricular  endocardial borders. The left ventricular internal cavity size was normal in size. There is mild left ventricular hypertrophy. Left ventricular diastolic parameters are consistent with Grade I diastolic dysfunction (impaired relaxation). Right Ventricle: The right ventricular size is normal. No increase in right ventricular wall thickness. Right ventricular systolic function is normal. Tricuspid regurgitation signal is inadequate for assessing PA pressure. Left Atrium: Left atrial size was normal in size. Right Atrium: Right atrial size was normal in size. Pericardium: There is no evidence of pericardial effusion. Mitral Valve: The mitral valve is normal in structure. Trivial mitral valve regurgitation. Tricuspid Valve: The tricuspid valve is normal in structure. Tricuspid valve regurgitation is trivial. Aortic Valve: The aortic valve is tricuspid. Aortic valve regurgitation is  mild to moderate. Aortic regurgitation PHT measures 358 msec. No aortic stenosis is present. Pulmonic Valve: The pulmonic valve was not well visualized. Pulmonic valve regurgitation is not visualized. Aorta: The aortic root is normal in size and structure and aortic dilatation noted. There is mild dilatation of the ascending aorta, measuring 39 mm. Venous: The inferior vena cava is normal in size with greater than 50% respiratory variability, suggesting right atrial pressure of 3 mmHg. IAS/Shunts: The interatrial septum was not well visualized.  LEFT VENTRICLE PLAX 2D LVIDd:         5.10 cm     Diastology LVIDs:         2.90 cm     LV e' medial:    4.24 cm/s LV PW:         0.90 cm     LV E/e' medial:  15.2 LV IVS:        1.30 cm     LV e' lateral:   3.92 cm/s LVOT diam:     2.40 cm     LV E/e' lateral: 16.5 LV SV:         91 LV SV Index:   48 LVOT Area:     4.52 cm  LV Volumes (MOD) LV vol d, MOD A2C: 92.8 ml LV vol d, MOD A4C: 66.4 ml LV vol s, MOD A2C: 65.6 ml LV vol s, MOD A4C: 35.0 ml LV SV MOD A2C:     27.2 ml LV SV MOD A4C:     66.4 ml LV SV MOD BP:      29.5 ml RIGHT VENTRICLE             IVC RV S prime:     12.80 cm/s  IVC diam: 1.80 cm TAPSE (M-mode): 1.6 cm LEFT ATRIUM             Index        RIGHT ATRIUM          Index LA diam:        3.50 cm 1.87 cm/m   RA Area:     6.75 cm LA Vol (A2C):   27.9 ml 14.87 ml/m  RA Volume:   10.70 ml 5.70 ml/m LA Vol (A4C):   16.1 ml 8.58 ml/m LA Biplane Vol: 22.4 ml 11.94 ml/m  AORTIC VALVE LVOT Vmax:   134.00 cm/s LVOT Vmean:  88.100 cm/s LVOT VTI:    0.201 m AI PHT:      358 msec  AORTA Ao Root diam: 3.90 cm Ao Asc diam:  3.90 cm MITRAL VALVE MV Area (PHT): 4.71 cm     SHUNTS MV Decel Time:  161 msec     Systemic VTI:  0.20 m MV E velocity: 64.50 cm/s   Systemic Diam: 2.40 cm MV A velocity: 109.50 cm/s MV E/A ratio:  0.59 Carson Clara MD Electronically signed by Carson Clara MD Signature Date/Time: 07/19/2023/6:32:45 PM    Final    VAS US  LOWER  EXTREMITY VENOUS (DVT) (7a-7p) Result Date: 07/18/2023  Lower Venous DVT Study Patient Name:  Samuel [name]  Date of Exam:   07/18/2023 Medical Rec #: [id number]           Accession #:    [id number] Date of Birth: 03-17-1946           Patient Gender: M Patient Age:   1 years Exam Location:  [facility] Procedure:      VAS US  LOWER EXTREMITY VENOUS (DVT) Referring Phys: MELANIE BELFI --------------------------------------------------------------------------------  Indications: Pain, Erythema, and Edema.  Risk Factors: PE & DVT 04/15/2023, esophageal cancer on chemotherapy. Anticoagulation: Eliquis . Limitations: Body habitus, poor ultrasound/tissue interface and pain intolerance. Comparison Study: Previous exam on 04/15/2023 was positive for DVT in BLE PTVs Performing Technologist: Arlyce Berger RVT, RDMS  Examination Guidelines: A complete evaluation includes B-mode imaging, spectral Doppler, color Doppler, and power Doppler as needed of all accessible portions of each vessel. Bilateral testing is considered an integral part of a complete examination. Limited examinations for reoccurring indications may be performed as noted. The reflux portion of the exam is performed with the patient in reverse Trendelenburg.  +---------+---------------+---------+-----------+----------+-------------------+ RIGHT    CompressibilityPhasicitySpontaneityPropertiesThrombus Aging      +---------+---------------+---------+-----------+----------+-------------------+ CFV      Full           Yes      Yes                                      +---------+---------------+---------+-----------+----------+-------------------+ SFJ      Full                                                             +---------+---------------+---------+-----------+----------+-------------------+ FV Prox  Full           Yes      Yes                                       +---------+---------------+---------+-----------+----------+-------------------+ FV Mid   Full           Yes      Yes                                      +---------+---------------+---------+-----------+----------+-------------------+ FV DistalFull           Yes      Yes                                      +---------+---------------+---------+-----------+----------+-------------------+ PFV      Full                                                             +---------+---------------+---------+-----------+----------+-------------------+  POP      Full           Yes      Yes                                      +---------+---------------+---------+-----------+----------+-------------------+ PTV      Full                                                             +---------+---------------+---------+-----------+----------+-------------------+ PERO                                                  Not well visualized +---------+---------------+---------+-----------+----------+-------------------+   +---------+---------------+---------+-----------+----------+------------------+ LEFT     CompressibilityPhasicitySpontaneityPropertiesThrombus Aging     +---------+---------------+---------+-----------+----------+------------------+ CFV      Full           Yes      Yes                                     +---------+---------------+---------+-----------+----------+------------------+ SFJ      Full                                                            +---------+---------------+---------+-----------+----------+------------------+ FV Prox  Full           No       Yes                                     +---------+---------------+---------+-----------+----------+------------------+ FV Mid   Full           Yes      Yes                                     +---------+---------------+---------+-----------+----------+------------------+ FV  DistalFull           Yes      Yes                                     +---------+---------------+---------+-----------+----------+------------------+ PFV      Full                                                            +---------+---------------+---------+-----------+----------+------------------+ POP      Full           No       Yes                                     +---------+---------------+---------+-----------+----------+------------------+  PTV      None           No       No                   Chronic            +---------+---------------+---------+-----------+----------+------------------+ PERO                                                  unable to                                                                visualized         +---------+---------------+---------+-----------+----------+------------------+     Summary: BILATERAL: -No evidence of popliteal cyst, bilaterally. RIGHT: - There is no evidence of deep vein thrombosis in the lower extremity.  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   *See table(s) above for measurements and observations. Electronically signed by Delaney Fearing on 07/18/2023 at 9:25:45 PM.    Final    DG Chest 2 View Result Date: 07/18/2023 CLINICAL DATA:  leg swelling EXAM: CHEST - 2 VIEW COMPARISON:  Chest x-ray 05/22/2023, CT chest 04/14/2023 FINDINGS: The heart and mediastinal contours are unchanged. Atherosclerotic plaque. Elevated left hemidiaphragm. Left mid lung zone linear atelectasis versus scarring. No focal consolidation. No pulmonary edema. No pleural effusion. No pneumothorax. No acute osseous abnormality. Degenerative changes of the left shoulder. IMPRESSION: 1. No active cardiopulmonary disease. 2.  Aortic Atherosclerosis (ICD10-I70.0). Electronically Signed   By: Morgane  Naveau M.D.   On: 07/18/2023 20:28   DG Foot 2 Views Left Result Date:  07/18/2023 CLINICAL DATA:  Bilateral foot swelling, redness. EXAM: LEFT FOOT - 2 VIEW COMPARISON:  None Available. FINDINGS: Marked soft tissue swelling, especially dorsally on the lateral view. No acute fracture or dislocation. Tiny calcaneal spur. No soft tissue gas. No radiopaque foreign object. No osseous destruction. IMPRESSION: Soft tissue swelling, without acute osseous finding. Electronically Signed   By: Lore Rode M.D.   On: 07/18/2023 20:25   DG Foot 2 Views Right Result Date: 07/18/2023 CLINICAL DATA:  Swelling of both lower extremities. Redness. Oozing blisters. EXAM: RIGHT FOOT - 2 VIEW COMPARISON:  None Available. FINDINGS: There is diffuse decreased bone mineralization. Mild great toe metatarsophalangeal joint space narrowing and lateral osteophytosis. Mild-to-moderate plantar calcaneal heel spur. Large body habitus with prominent dorsal foot soft tissues, symmetric to the contralateral foot imaged the same day. This likely includes soft tissue swelling and edema. No definite soft tissue ulcer is identified. No cortical erosion is seen. IMPRESSION: 1. Mild great toe metatarsophalangeal joint osteoarthritis. 2. Mild-to-moderate plantar calcaneal heel spur. 3. No radiographic evidence of osteomyelitis. Electronically Signed   By: Bertina Broccoli M.D.   On: 07/18/2023 19:31    Pending Labs Unresulted Labs (From admission, onward)     Start     Ordered   07/20/23 0500  Basic metabolic panel  Tomorrow morning,   R        07/19/23 0759   07/20/23 0500  CBC  Tomorrow morning,  R        07/19/23 0759            Vitals/Pain Today's Vitals   07/19/23 1322 07/19/23 1700 07/19/23 1706 07/19/23 1845  BP: 108/73 108/60  112/68  Pulse: 78 (!) 107  (!) 102  Resp: 18 18  19   Temp: 98.1 F (36.7 C)  98.6 F (37 C)   TempSrc: Oral  Oral   SpO2: 100% 99%  93%  PainSc: 4        Isolation Precautions No active isolations  Medications Medications  insulin  aspart (novoLOG ) injection  0-9 Units (0 Units Subcutaneous Not Given 07/19/23 1704)  insulin  aspart (novoLOG ) injection 0-5 Units (has no administration in time range)  polyethylene glycol (MIRALAX  / GLYCOLAX ) packet 17 g (has no administration in time range)  bisacodyl  (DULCOLAX) EC tablet 5 mg (has no administration in time range)  albuterol  (PROVENTIL ) (2.5 MG/3ML) 0.083% nebulizer solution 2.5 mg (has no administration in time range)  hydrALAZINE  (APRESOLINE ) injection 10 mg (has no administration in time range)  apixaban  (ELIQUIS ) tablet 5 mg (5 mg Per Tube Given 07/19/23 1344)  feeding supplement (OSMOLITE 1.5 CAL) liquid 237 mL (237 mLs Per Tube Patient Refused/Not Given 07/19/23 1707)  vancomycin  (VANCOCIN ) 750 mg in sodium chloride  0.9 % 250 mL IVPB (has no administration in time range)  feeding supplement (PROSource TF20) liquid 45 mL (45 mLs Per Tube Patient Refused/Not Given 07/19/23 1707)  famotidine  (PEPCID ) tablet 40 mg (has no administration in time range)  oxyCODONE  (Oxy IR/ROXICODONE ) immediate release tablet 5 mg (5 mg Per Tube Given 07/19/23 1350)  perflutren  lipid microspheres (DEFINITY ) IV suspension (2 mLs Intravenous Given 07/19/23 1553)  ceFEPIme  (MAXIPIME ) 2 g in sodium chloride  0.9 % 100 mL IVPB (0 g Intravenous Stopped 07/19/23 0635)  vancomycin  (VANCOREADY) IVPB 1750 mg/350 mL (0 mg Intravenous Stopped 07/19/23 0809)  vancomycin  (VANCOCIN ) IVPB 1000 mg/200 mL premix (0 mg Intravenous Stopped 07/19/23 1330)    Mobility walks with person assist     Focused Assessments     R Recommendations: See Admitting Provider Note  Report given to:   Additional Notes:

## 2023-07-20 DIAGNOSIS — L03119 Cellulitis of unspecified part of limb: Secondary | ICD-10-CM | POA: Diagnosis not present

## 2023-07-20 LAB — BASIC METABOLIC PANEL
Anion gap: 7 (ref 5–15)
BUN: 20 mg/dL (ref 8–23)
CO2: 22 mmol/L (ref 22–32)
Calcium: 7.8 mg/dL — ABNORMAL LOW (ref 8.9–10.3)
Chloride: 105 mmol/L (ref 98–111)
Creatinine, Ser: 0.71 mg/dL (ref 0.61–1.24)
GFR, Estimated: >60 mL/min (ref >60)
Glucose, Bld: 105 mg/dL — ABNORMAL HIGH (ref 70–99)
Potassium: 4 mmol/L (ref 3.5–5.1)
Sodium: 134 mmol/L — ABNORMAL LOW (ref 135–145)

## 2023-07-20 LAB — GLUCOSE, CAPILLARY
Glucose-Capillary: 109 mg/dL — ABNORMAL HIGH (ref 70–99)
Glucose-Capillary: 146 mg/dL — ABNORMAL HIGH (ref 70–99)
Glucose-Capillary: 168 mg/dL — ABNORMAL HIGH (ref 70–99)
Glucose-Capillary: 94 mg/dL (ref 70–99)

## 2023-07-20 LAB — CBC
HCT: 34 % — ABNORMAL LOW (ref 39.0–52.0)
Hemoglobin: 11.4 g/dL — ABNORMAL LOW (ref 13.0–17.0)
MCH: 32.1 pg (ref 26.0–34.0)
MCHC: 33.5 g/dL (ref 30.0–36.0)
MCV: 95.8 fL (ref 80.0–100.0)
Platelets: 81 10*3/uL — ABNORMAL LOW (ref 150–400)
RBC: 3.55 MIL/uL — ABNORMAL LOW (ref 4.22–5.81)
RDW: 18.8 % — ABNORMAL HIGH (ref 11.5–15.5)
WBC: 9.2 10*3/uL (ref 4.0–10.5)
nRBC: 0 % (ref 0.0–0.2)

## 2023-07-20 LAB — PHOSPHORUS: Phosphorus: 3 mg/dL (ref 2.5–4.6)

## 2023-07-20 LAB — MAGNESIUM: Magnesium: 2.1 mg/dL (ref 1.7–2.4)

## 2023-07-20 MED ORDER — OSMOLITE 1.5 CAL PO LIQD
474.0000 mL | Freq: Three times a day (TID) | ORAL | Status: DC
  Administered 2023-07-20 – 2023-07-23 (×10): 474 mL

## 2023-07-20 MED ORDER — ACETAMINOPHEN 160 MG/5ML PO SOLN: 650.0000 mg | Freq: Four times a day (QID) | ORAL | Status: DC | PRN

## 2023-07-20 MED ORDER — PROSOURCE TF20 ENFIT COMPATIBL EN LIQD
45.0000 mL | Freq: Two times a day (BID) | ENTERAL | Status: DC
  Administered 2023-07-20 – 2023-07-23 (×6): 45 mL
  Filled 2023-07-20 (×6): qty 60

## 2023-07-20 MED ORDER — LACTULOSE 10 GM/15ML PO SOLN
20.0000 g | Freq: Two times a day (BID) | ORAL | Status: DC | PRN
  Administered 2023-07-20 – 2023-07-21 (×2): 20 g
  Filled 2023-07-20 (×2): qty 30

## 2023-07-20 MED ORDER — FREE WATER
120.0000 mL | Freq: Every day | Status: DC
  Administered 2023-07-20 (×2): 120 mL

## 2023-07-20 MED ORDER — FREE WATER
180.0000 mL | Freq: Every day | Status: DC
  Administered 2023-07-20 – 2023-07-23 (×19): 180 mL

## 2023-07-20 NOTE — Telephone Encounter (Signed)
noted 

## 2023-07-20 NOTE — Telephone Encounter (Signed)
Pt has been admitted to [facility] due to an allergic reaction. Pt had swelling and a rash on both of his ankles & feet. Pt states it is painful. Pt believes the reaction is due to a dye in the socks he was wearing. Pt calling to inform Dr. Alvis Lemmings.   Pt also requesting Dr. Alvis Lemmings send over a referral to a dermatology allergist. Pt is aware of an allergist within [facility] that he could be referred to. Pt unsure if PCP should send the referral or wait for the provider in hospital to send referral.

## 2023-07-20 NOTE — Progress Notes (Signed)
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  He admitted to having mild bilateral lower extremity swelling at baseline but stated this was significantly worse.  In the ER the patient was afebrile but had an elevated WBC count of 12.1.  Bilateral foot plain film x-rays were without evidence of osteomyelitis.  Goals of Care:   Code Status: Full Code   DVT prophylaxis:  apixaban (ELIQUIS) tablet 5 mg   Interim Hx: Afebrile since admission.  Mildly tachycardic with heart rates 106-121.  Vital signs otherwise stable.  Denies any new complaints.  Resting comfortably in bed at the time of my visit.  States that his legs feel "about the same" today.  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 Mosetta Putt at the cancer center - undergoing chemotherapy and XRT  Family Communication: No family present at time of exam Disposition:  Anticipate eventual discharge home after significant medical improvement   Objective: Blood pressure 129/89, pulse (!) 116, temperature 98 F (36.7 C), resp. rate 18, height 5\' 6"  (1.676 m), weight 81.1 kg, SpO2 96%. No intake or output data in the 24 hours ending 07/20/23 0905 Filed Weights   07/20/23 0008  Weight: 81.1 kg    Examination: General: No acute respiratory distress Lungs: Clear to auscultation bilaterally without wheezes or crackles Cardiovascular: Regular rate and rhythm without murmur gallop or rub normal S1 and S2 Abdomen: Nontender, nondistended, soft, bowel sounds positive, no rebound, no ascites, no appreciable mass Extremities: Persistent erythema bilateral lower extremities with slightly decreased edema but still rated at 1+ edema bilaterally  CBC: Recent Labs  Lab 07/18/23 2010 07/20/23 0536  WBC 12.1* 9.2  NEUTROABS 9.6*  --   HGB 13.3 11.4*  HCT 39.5 34.0*  MCV 94.5 95.8  PLT 114* 81*   Basic Metabolic Panel: Recent Labs  Lab 07/18/23 2010 07/20/23 0531 07/20/23 0536  NA 135  --  134*  K 4.6  --  4.0  CL 100  --  105  CO2 23  --  22  GLUCOSE 124*  --  105*  BUN 30*  --  20  CREATININE 0.89  --  0.71  CALCIUM 9.0  --  7.8*  MG  --  2.1  --   PHOS  --  3.0  --    GFR: Estimated Creatinine Clearance: 77.3 mL/min (by C-G formula based on SCr of 0.71 mg/dL).   Scheduled Meds:  apixaban  5 mg Per Tube BID   famotidine  40 mg Per  Tube Daily   feeding supplement (OSMOLITE 1.5 CAL)  237 mL Per Tube TID   feeding supplement (PROSource TF20)  45 mL Per Tube TID WC   insulin aspart  0-5 Units Subcutaneous QHS   insulin aspart  0-9 Units Subcutaneous TID WC   Continuous Infusions:  vancomycin Stopped (07/19/23 2131)     LOS: 1 day   Lonia Blood, MD [facility] Office  [phone] Pager - Text Page per Loretha Stapler  If 7PM-7AM, please contact night-coverage per Amion 07/20/2023, 9:05 AM

## 2023-07-20 NOTE — Progress Notes (Addendum)
Initial Nutrition Assessment     INTERVENTION:   -Continue  per home tube feeding regimen via PEG tube: Osmolite 1.5-237 mL carton, total of 6 cartons daily (1422 ml per day)  Prosource TF20 45 ml BID  Provides 2283 kcal, 119 gm protein, 1084 ml free water daily, additional 30 mL before and following each bolus feed; recommend additional water flushes of Q4 hrs.   -NPO    NUTRITION DIAGNOSIS:   Inadequate oral intake related to inability to eat, dysphagia as evidenced by other (comment), NPO status (100% nutrition reliance on tube feeding).    GOAL:   Patient will meet greater than or equal to 90% of their needs    MONITOR:   Labs, Weight trends, TF tolerance, Skin  REASON FOR ASSESSMENT:   Consult Enteral/tube feeding initiation and management  ASSESSMENT:  78 y/o male presented to the ED with complaint of worsening redness and swelling of both lower extremities for three days. Patient is a live in caregiver for a couple.  He is physically independent.  In October 2024, had progressive dysphagia and weight loss, diagnosed with esophageal cancer.  He was deemed not a candidate for surgery, started chemoradiation.  PMH: esophageal cancer S/P PEG tube, DVT/PE 2024, CHF, DM2, smoking hx.   Patient receives 100% of nutrition via the feeding tube, nothing by mouth. Tube feeding Rx: Osmolite 1.5, 2 cartons TID, total of six cartons and Prosource Plus TID. Currently the Prosource Plus TF ordered is 45 mL TID, RN confirmed the product is from hospital formulary and it is measured out to 45 mL.  He is uncertain amount of additional fluid flushes. Notes he is tolerating the tube feeding was since he has been in the hospital. Noted constipation, most recent bowel movement was about 48 hours ago, received laxative. Per review of EMR weight hx reveals weight loss of 12% within three months which is severe. Patient stated usual weight is 165-170# which has been stable the past  month.   Medications reviewed and include novolog SS 3x daily with meals and at bedtime, pepcid, PRN: oxycodone, lactulose.  Labs reviewed  Diet Order:   Diet Order     None       EDUCATION NEEDS:   Education needs have been addressed  Skin:  Skin Assessment: Reviewed RN Assessment  Last BM:  prior to admission, reported constipation-on bowel regimen  Height:   Ht Readings from Last 1 Encounters:  07/20/23 5\' 6"  (1.676 m)    Weight:   Wt Readings from Last 1 Encounters:  07/20/23 81.1 kg    Ideal Body Weight:  61.8 kg  BMI:  Body mass index is 28.86 kg/m.  Estimated Nutritional Needs:   Kcal:  2100-2250 kcal/day  Protein:  98-113 gm/day  Fluid:  2100-2250 mL/day    [name], RDLD Clinical Dietitian If unable to reach, please contact "[facility] Inpatient" secure chat group between 8 am-4 pm daily"

## 2023-07-21 ENCOUNTER — Encounter: Payer: Self-pay | Admitting: Hematology

## 2023-07-21 ENCOUNTER — Other Ambulatory Visit: Payer: Self-pay

## 2023-07-21 DIAGNOSIS — L03119 Cellulitis of unspecified part of limb: Secondary | ICD-10-CM | POA: Diagnosis not present

## 2023-07-21 LAB — PHOSPHORUS: Phosphorus: 3.1 mg/dL (ref 2.5–4.6)

## 2023-07-21 LAB — COMPREHENSIVE METABOLIC PANEL
ALT: 15 U/L (ref 0–44)
AST: 24 U/L (ref 15–41)
Albumin: 2 g/dL — ABNORMAL LOW (ref 3.5–5.0)
Alkaline Phosphatase: 76 U/L (ref 38–126)
Anion gap: 6 (ref 5–15)
BUN: 21 mg/dL (ref 8–23)
CO2: 26 mmol/L (ref 22–32)
Calcium: 8 mg/dL — ABNORMAL LOW (ref 8.9–10.3)
Chloride: 107 mmol/L (ref 98–111)
Creatinine, Ser: 0.64 mg/dL (ref 0.61–1.24)
GFR, Estimated: >60 mL/min (ref >60)
Glucose, Bld: 108 mg/dL — ABNORMAL HIGH (ref 70–99)
Potassium: 4.7 mmol/L (ref 3.5–5.1)
Sodium: 139 mmol/L (ref 135–145)
Total Bilirubin: 0.8 mg/dL (ref 0.0–1.2)
Total Protein: 4.6 g/dL — ABNORMAL LOW (ref 6.5–8.1)

## 2023-07-21 LAB — CBC
HCT: 36 % — ABNORMAL LOW (ref 39.0–52.0)
Hemoglobin: 11.7 g/dL — ABNORMAL LOW (ref 13.0–17.0)
MCH: 31.7 pg (ref 26.0–34.0)
MCHC: 32.5 g/dL (ref 30.0–36.0)
MCV: 97.6 fL (ref 80.0–100.0)
Platelets: 73 10*3/uL — ABNORMAL LOW (ref 150–400)
RBC: 3.69 MIL/uL — ABNORMAL LOW (ref 4.22–5.81)
RDW: 19.1 % — ABNORMAL HIGH (ref 11.5–15.5)
WBC: 8.1 10*3/uL (ref 4.0–10.5)
nRBC: 0 % (ref 0.0–0.2)

## 2023-07-21 LAB — GLUCOSE, CAPILLARY
Glucose-Capillary: 104 mg/dL — ABNORMAL HIGH (ref 70–99)
Glucose-Capillary: 142 mg/dL — ABNORMAL HIGH (ref 70–99)
Glucose-Capillary: 146 mg/dL — ABNORMAL HIGH (ref 70–99)
Glucose-Capillary: 126 mg/dL — ABNORMAL HIGH (ref 70–99)

## 2023-07-21 LAB — MAGNESIUM: Magnesium: 2.2 mg/dL (ref 1.7–2.4)

## 2023-07-21 MED ORDER — FAMOTIDINE 20 MG PO TABS
40.0000 mg | ORAL_TABLET | Freq: Two times a day (BID) | ORAL | Status: DC
  Administered 2023-07-21 – 2023-07-23 (×5): 40 mg
  Filled 2023-07-21 (×5): qty 2

## 2023-07-21 NOTE — Progress Notes (Signed)
Mobility Specialist Progress Note:    07/21/23 1500  Mobility  Activity Transferred from chair to bed  Level of Assistance Contact guard assist, steadying assist  Assistive Device Front wheel walker  Distance Ambulated (ft) 4 ft  Activity Response Tolerated well  Mobility Referral Yes  Mobility visit 1 Mobility  Mobility Specialist Start Time (ACUTE ONLY) 1515  Mobility Specialist Stop Time (ACUTE ONLY) 1524  Mobility Specialist Time Calculation (min) (ACUTE ONLY) 9 min   Pt received in chair and agreeable. Able to stand and take steps to bed w/ just minG. No complaints throughout. Pt left in bed with call bell and all needs met. Bed alarm on.  D'Vante Earlene Plater Mobility Specialist Please contact via Special educational needs teacher or Rehab office at [phone]

## 2023-07-21 NOTE — Evaluation (Addendum)
Physical Therapy Evaluation Patient Details Name: Samuel [name] MRN: [id number] DOB: May 23, 1946 Today's Date: 07/21/2023  History of Present Illness  78 y.o. male presents to [facility] 07/18/23 with worsening redness and swelling on B LE for three days after trying new socks. Admitted w/ cellulitis w/ purulence superimposed on B LE venous statis dermatitis. Pt w/ chronic L LE DVT, on eliquis. 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 PMH positive for DM, B knee arthroplasty.   Clinical Impression  Pt in bed upon arrival and agreeable to PT eval. Prior to admit, was was independent with no AD in the home and RW in the community. In today's session, pt was able to stand with MinA and ambulate ~51ft with CGA and RW. Pt declined further ambulation as he wanted to just make it to the recliner. Pt presents to therapy session with decreased LE strength, balance, and mobility. Pt would benefit from acute skilled PT to address functional impairments. Recommending post-acute HHPT to work towards independence with mobility. Acute PT to follow.          If plan is discharge home, recommend the following: A little help with walking and/or transfers;A little help with bathing/dressing/bathroom;Assist for transportation;Help with stairs or ramp for entrance   Can travel by private vehicle    Yes    Equipment Recommendations None recommended by PT     Functional Status Assessment Patient has had a recent decline in their functional status and demonstrates the ability to make significant improvements in function in a reasonable and predictable amount of time.     Precautions / Restrictions Precautions Precautions: Fall Precaution Comments: BLE dermatitis Restrictions Weight Bearing Restrictions Per Provider Order: No      Mobility  Bed Mobility Overal bed mobility: Needs Assistance Bed Mobility: Supine to Sit    Supine to sit: Contact guard, HOB elevated, Used  rails    General bed mobility comments: increased time, CGA for safety    Transfers Overall transfer level: Needs assistance Equipment used: Rolling walker (2 wheels) Transfers: Sit to/from Stand Sit to Stand: Min assist      General transfer comment: MinA to stabilize RW, pt was educated on proper hand placement as pt prefers to pull with BUE and have wife hold RW steady    Ambulation/Gait Ambulation/Gait assistance: Contact guard assist Gait Distance (Feet): 8 Feet Assistive device: Rolling walker (2 wheels) Gait Pattern/deviations: Step-through pattern, Shuffle Gait velocity: decr     General Gait Details: slow and steady gait      Balance Overall balance assessment: Needs assistance, Mild deficits observed, not formally tested Sitting-balance support: Bilateral upper extremity supported, Feet supported Sitting balance-Leahy Scale: Good     Standing balance support: Bilateral upper extremity supported, During functional activity, Reliant on assistive device for balance Standing balance-Leahy Scale: Poor Standing balance comment: reliant on RW       Pertinent Vitals/Pain Pain Assessment Pain Assessment: Faces Faces Pain Scale: Hurts even more Pain Location: BLE Pain Descriptors / Indicators: Aching, Discomfort Pain Intervention(s): Limited activity within patient's tolerance, Monitored during session, Premedicated before session, Repositioned    Home Living Family/patient expects to be discharged to:: Private residence Living Arrangements: Spouse/significant other;Non-relatives/Friends Available Help at Discharge: Family;Friend(s);Available 24 hours/day Type of Home: House Home Access: Stairs to enter;Ramped entrance Entrance Stairs-Rails: Right;Left Entrance Stairs-Number of Steps: 5 Alternate Level Stairs-Number of Steps: flight Home Layout: Two level;Able to live on main level with bedroom/bathroom Home Equipment: Tub bench;Grab bars -  tub/shower;Hand  held shower head;Cane - single point;Rolling Walker (2 wheels);BSC/3in1;Hospital bed Additional Comments: 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 He helps with their daily ADL's and chores, though they have other help from [facility] and go to programs most days. Patient's wife here from [place] there as well.  Patient lives on the main level    Prior Function Prior Level of Function : Independent/Modified Independent      Mobility Comments: no DME in the home, RW outside the home ADLs Comments: Indep in ADL and IADL uses tub bench and DME as energy conservation marginal, but his wife assists with cleaning and cooking now     Extremity/Trunk Assessment   Upper Extremity Assessment Upper Extremity Assessment: Defer to OT evaluation    Lower Extremity Assessment Lower Extremity Assessment: Generalized weakness (B lower legs wrapped, intact and dry)    Cervical / Trunk Assessment Cervical / Trunk Assessment: Normal  Communication   Communication Communication: No apparent difficulties (tangential)  Cognition Arousal: Alert Behavior During Therapy: WFL for tasks assessed/performed Overall Cognitive Status: Within Functional Limits for tasks assessed       General Comments General comments (skin integrity, edema, etc.): VSS on RA     PT Assessment Patient needs continued PT services  PT Problem List Decreased strength;Decreased activity tolerance;Decreased balance;Decreased mobility;Decreased knowledge of use of DME;Decreased safety awareness       PT Treatment Interventions DME instruction;Gait training;Stair training;Functional mobility training;Therapeutic activities;Therapeutic exercise;Balance training;Neuromuscular re-education;Patient/family education    PT Goals (Current goals can be found in the Care Plan section)  Acute Rehab PT Goals Patient Stated Goal: to get stronger PT Goal  Formulation: With patient Time For Goal Achievement: 08/04/23 Potential to Achieve Goals: Good    Frequency Min 1X/week        AM-PAC PT "6 Clicks" Mobility  Outcome Measure Help needed turning from your back to your side while in a flat bed without using bedrails?: None Help needed moving from lying on your back to sitting on the side of a flat bed without using bedrails?: A Little Help needed moving to and from a bed to a chair (including a wheelchair)?: A Little Help needed standing up from a chair using your arms (e.g., wheelchair or bedside chair)?: A Little Help needed to walk in hospital room?: A Little Help needed climbing 3-5 steps with a railing? : A Lot 6 Click Score: 18    End of Session   Activity Tolerance: Patient tolerated treatment well Patient left: in chair;with call bell/phone within reach Nurse Communication: Mobility status PT Visit Diagnosis: Unsteadiness on feet (R26.81);Muscle weakness (generalized) (M62.81)    Time: 1440-1500 PT Time Calculation (min) (ACUTE ONLY): 20 min   Charges:   PT Evaluation $PT Eval Low Complexity: 1 Low   PT General Charges $$ ACUTE PT VISIT: 1 Visit         Samuel [name], PT, DPT Secure Chat Preferred  Rehab Office [phone]   Samuel [name] 07/21/2023, 3:45 PM

## 2023-07-21 NOTE — Progress Notes (Signed)
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  He admitted to having mild bilateral lower extremity swelling at baseline but stated this was significantly worse.  In the ER the patient was afebrile but had an elevated WBC count of 12.1.  Bilateral foot plain film x-rays were without evidence of osteomyelitis.  Goals of Care:   Code Status: Full Code   DVT prophylaxis:  apixaban (ELIQUIS) tablet 5 mg   Interim Hx: No acute events reported overnight.  Afebrile.  Vital signs stable.  No new complaints today.  Reports decreased pain redness and swelling in his legs.  Feels that he is getting better.  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 Mosetta Putt at the cancer center - undergoing chemotherapy and XRT -PEG tube dependent  Family Communication: No family present at  time of exam Disposition: Anticipate eventual discharge home likely 11/19 or 11/20   Objective: Blood pressure 106/77, pulse 87, temperature 98.5 F (36.9 C), temperature source Oral, resp. rate 16, height 5\' 6"  (1.676 m), weight 81.1 kg, SpO2 96%.  Intake/Output Summary (Last 24 hours) at 07/21/2023 0841 Last data filed at 07/20/2023 2003 Gross per 24 hour  Intake --  Output 100 ml  Net -100 ml   Filed Weights   07/20/23 0008  Weight: 81.1 kg    Examination: General: No acute respiratory distress Lungs: Clear to auscultation bilaterally  Cardiovascular: Regular rate and rhythm without murmur  Abdomen: Nontender, nondistended, soft, bowel sounds positive, no rebound Extremities: Significant decrease in erythema of forefoot/toes with resolution of edema  CBC: Recent Labs  Lab 07/18/23 2010 07/20/23 0536 07/21/23 0634  WBC 12.1* 9.2 8.1  NEUTROABS 9.6*  --   --   HGB 13.3 11.4* 11.7*  HCT 39.5 34.0* 36.0*  MCV 94.5 95.8 97.6  PLT 114* 81* 73*   Basic Metabolic Panel: Recent Labs  Lab 07/18/23 2010 07/20/23 0531 07/20/23 0536 07/21/23 0634  NA 135  --  134* 139  K 4.6  --  4.0 4.7  CL 100  --  105 107  CO2 23  --  22 26  GLUCOSE 124*  --  105* 108*  BUN 30*  --  20 21  CREATININE 0.89  --  0.71 0.64  CALCIUM 9.0  --  7.8* 8.0*  MG  --  2.1  --  2.2  PHOS  --  3.0  --  3.1   GFR: Estimated  Creatinine Clearance: 77.3 mL/min (by C-G formula based on SCr of 0.64 mg/dL).   Scheduled Meds:  apixaban  5 mg Per Tube BID   famotidine  40 mg Per Tube BID   feeding supplement (OSMOLITE 1.5 CAL)  474 mL Per Tube TID   feeding supplement (PROSource TF20)  45 mL Per Tube BID   free water  180 mL Per Tube 6 X Daily   insulin aspart  0-5 Units Subcutaneous QHS   insulin aspart  0-9 Units Subcutaneous TID WC   Continuous Infusions:  vancomycin 750 mg (07/21/23 0745)     LOS: 2 days   Lonia Blood, MD [facility] Office  [phone] Pager - Text  Page per Loretha Stapler  If 7PM-7AM, please contact night-coverage per Amion 07/21/2023, 8:41 AM

## 2023-07-22 DIAGNOSIS — L03119 Cellulitis of unspecified part of limb: Secondary | ICD-10-CM | POA: Diagnosis not present

## 2023-07-22 LAB — GLUCOSE, CAPILLARY
Glucose-Capillary: 105 mg/dL — ABNORMAL HIGH (ref 70–99)
Glucose-Capillary: 117 mg/dL — ABNORMAL HIGH (ref 70–99)
Glucose-Capillary: 109 mg/dL — ABNORMAL HIGH (ref 70–99)
Glucose-Capillary: 100 mg/dL — ABNORMAL HIGH (ref 70–99)

## 2023-07-22 MED ORDER — DOXYCYCLINE HYCLATE 100 MG PO TABS: 100.0000 mg | ORAL_TABLET | Freq: Two times a day (BID) | ORAL | Status: DC

## 2023-07-22 MED ORDER — DOXYCYCLINE HYCLATE 100 MG PO TABS
100.0000 mg | ORAL_TABLET | Freq: Two times a day (BID) | ORAL | Status: DC
  Administered 2023-07-22 – 2023-07-23 (×3): 100 mg
  Filled 2023-07-22 (×3): qty 1

## 2023-07-22 NOTE — Progress Notes (Signed)
Patient's dressings changed at this time. Bilateral legs appear to be better compared to yesterday. Less swelling and decreased redness noted.

## 2023-07-22 NOTE — Evaluation (Signed)
Occupational Therapy Evaluation Patient Details Name: Samuel [name] MRN: [id number] DOB: [birth year] Today's Date: 07/22/2023   History of Present Illness 78 y.o. male presents to [facility] 07/18/23 with worsening redness and swelling on B LE for three days after trying new socks. Admitted w/ cellulitis w/ purulence superimposed on B LE venous statis dermatitis. Pt w/ chronic L LE DVT, on eliquis. 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 PMH positive for DM, B knee arthroplasty.   Clinical Impression   Pt admitted for above, doing well with ambulation and reports swelling/pain has decreased significantly. Pt still needing assist with LB ADLs, he reports his spouse can help with that at home. Pt overall CGA to close supervision when ambulating OOB. OT to continue to follow pt acutely to mobilize pt to reduce swelling and help transition to next level of care. Patient would benefit from post acute Home OT services to help maximize functional independence in natural environment        If plan is discharge home, recommend the following: A little help with bathing/dressing/bathroom;Assistance with cooking/housework    Functional Status Assessment  Patient has had a recent decline in their functional status and demonstrates the ability to make significant improvements in function in a reasonable and predictable amount of time.  Equipment Recommendations  Other (comment) (pt has rec DME)    Recommendations for Other Services       Precautions / Restrictions Precautions Precautions: Fall Precaution Comments: BLE dermatitis Restrictions Weight Bearing Restrictions Per Provider Order: No      Mobility Bed Mobility Overal bed mobility: Needs Assistance Bed Mobility: Supine to Sit     Supine to sit: Supervision, Used rails, HOB elevated     General bed mobility comments: increased time    Transfers Overall transfer level: Needs assistance Equipment  used: Rolling walker (2 wheels) Transfers: Sit to/from Stand Sit to Stand: From elevated surface, Contact guard assist           General transfer comment: slightly elevated bed, cues for proper hand placement      Balance Overall balance assessment: Needs assistance, Mild deficits observed, not formally tested Sitting-balance support: Bilateral upper extremity supported, Feet supported Sitting balance-Leahy Scale: Good     Standing balance support: Bilateral upper extremity supported, During functional activity, Reliant on assistive device for balance Standing balance-Leahy Scale: Poor Standing balance comment: RW                           ADL either performed or assessed with clinical judgement   ADL Overall ADL's : Needs assistance/impaired Eating/Feeding: Independent;Sitting   Grooming: Standing;Contact guard assist   Upper Body Bathing: Set up;Sitting   Lower Body Bathing: Sitting/lateral leans;Moderate assistance   Upper Body Dressing : Sitting;Set up   Lower Body Dressing: Moderate assistance;Sitting/lateral leans Lower Body Dressing Details (indicate cue type and reason): assist with donning slipper booties? pt not able to bend at waist to reposition slippers or strap in velcro Toilet Transfer: Contact guard assist;Ambulation;Rolling walker (2 wheels)   Toileting- Clothing Manipulation and Hygiene: Sit to/from stand;Contact guard assist       Functional mobility during ADLs: Contact guard assist;Rolling walker (2 wheels) General ADL Comments: CGA + RW, fading to close supervision     Vision         Perception         Praxis         Pertinent Vitals/Pain  Pain Assessment Pain Assessment: Faces Faces Pain Scale: Hurts a little bit Pain Location: BLEs Pain Descriptors / Indicators: Aching, Discomfort Pain Intervention(s): Monitored during session, Repositioned     Extremity/Trunk Assessment Upper Extremity Assessment Upper Extremity  Assessment: Overall WFL for tasks assessed   Lower Extremity Assessment Lower Extremity Assessment: Generalized weakness (B lower legs wrapped, intact and dry)   Cervical / Trunk Assessment Cervical / Trunk Assessment: Normal   Communication Communication Communication: No apparent difficulties (tangenital) Cueing Techniques: Verbal cues   Cognition Arousal: Alert Behavior During Therapy: WFL for tasks assessed/performed Overall Cognitive Status: Within Functional Limits for tasks assessed                                 General Comments: Overall follows commands and converses well. His tangenital conversation can lead to sporadic talks of [employer], spiritual journeys, president nixon or [place].     General Comments       Exercises     Shoulder Instructions      Home Living Family/patient expects to be discharged to:: Private residence Living Arrangements: Spouse/significant other;Non-relatives/Friends Available Help at Discharge: Family;Friend(s);Available 24 hours/day Type of Home: House Home Access: Stairs to enter;Ramped entrance Entrance Stairs-Number of Steps: 5 Entrance Stairs-Rails: Right;Left Home Layout: Two level;Able to live on main level with bedroom/bathroom Alternate Level Stairs-Number of Steps: flight Alternate Level Stairs-Rails: Left Bathroom Shower/Tub: Tub/shower unit         Home Equipment: Tub bench;Grab bars - tub/shower;Hand held shower head;Cane - single point;Rolling Walker (2 wheels);BSC/3in1;Hospital bed   Additional Comments: 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 He helps with their daily ADL's and chores, though they have other help from [facility] and go to programs most days. Patient's wife here from [place] there as well.  Patient lives on the main level      Prior Functioning/Environment Prior Level of Function :  Independent/Modified Independent             Mobility Comments: no DME in the home, RW outside the home ADLs Comments: Indep in ADL and IADL uses tub bench and DME as energy conservation marginal, but his wife assists with cleaning and cooking now        OT Problem List: Decreased strength;Impaired balance (sitting and/or standing);Pain      OT Treatment/Interventions: DME and/or AE instruction;Self-care/ADL training;Therapeutic activities;Balance training;Therapeutic exercise;Patient/family education    OT Goals(Current goals can be found in the care plan section) Acute Rehab OT Goals Patient Stated Goal: To keep swelling down OT Goal Formulation: With patient Time For Goal Achievement: 08/05/23 Potential to Achieve Goals: Good ADL Goals Pt Will Perform Grooming: with modified independence;standing Pt Will Perform Lower Body Dressing: sitting/lateral leans;with caregiver independent in assisting;with set-up Pt Will Transfer to Toilet: with modified independence;ambulating Pt Will Perform Toileting - Clothing Manipulation and hygiene: with modified independence;sitting/lateral leans;sit to/from stand  OT Frequency: Min 1X/week    Co-evaluation              AM-PAC OT "6 Clicks" Daily Activity     Outcome Measure Help from another person eating meals?: None Help from another person taking care of personal grooming?: A Little Help from another person toileting, which includes using toliet, bedpan, or urinal?: A Little Help from another person bathing (including washing, rinsing, drying)?: A Lot Help from another person to  put on and taking off regular upper body clothing?: A Little Help from another person to put on and taking off regular lower body clothing?: A Lot 6 Click Score: 17   End of Session Equipment Utilized During Treatment: Gait belt;Rolling walker (2 wheels) Nurse Communication: Mobility status  Activity Tolerance: Patient tolerated treatment well Patient  left: in chair;with call bell/phone within reach;with chair alarm set  OT Visit Diagnosis: Unsteadiness on feet (R26.81);Pain Pain - Right/Left:  (bilat) Pain - part of body: Leg                Time: 4098-1191 OT Time Calculation (min): 37 min Charges:  OT General Charges $OT Visit: 1 Visit OT Evaluation $OT Eval Moderate Complexity: 1 Mod OT Treatments $Therapeutic Activity: 8-22 mins  07/22/2023  AB, OTR/L  Acute Rehabilitation Services  Office: [phone]   Tristan Schroeder 07/22/2023, 4:46 PM

## 2023-07-22 NOTE — Plan of Care (Signed)
  Problem: Education: Goal: Ability to describe self-care measures that may prevent or decrease complications (Diabetes Survival Skills Education) will improve Outcome: Progressing Goal: Individualized Educational Video(s) Outcome: Progressing   Problem: Coping: Goal: Ability to adjust to condition or change in health will improve Outcome: Progressing   

## 2023-07-22 NOTE — Progress Notes (Signed)
Pharmacy Antibiotic Note  Samuel [name] is a 78 y.o. male admitted on 07/18/2023 with cellulitis.  Pharmacy has been consulted for vancomycin dosing.  Renal function remains stable. Will hold off on levels for now based on stable renal function, conservative AUC estimate, and possible switch to antibiotics via PEG given clinical stability.  Plan: Vancomycin 750mg  IV q12 hours (eAUC 473, Vd0.72) Monitor for clinical progression and vancomycin levels as appropriate  Height: 5\' 6"  (167.6 cm) Weight: 76.9 kg (169 lb 8.5 oz) IBW/kg (Calculated) : 63.8  Temp (24hrs), Avg:98.4 F (36.9 C), Min:98.2 F (36.8 C), Max:98.6 F (37 C)  Recent Labs  Lab 07/18/23 2010 07/18/23 2159 07/20/23 0536 07/21/23 0634  WBC 12.1*  --  9.2 8.1  CREATININE 0.89  --  0.71 0.64  LATICACIDVEN  --  1.6  --   --     Estimated Creatinine Clearance: 75.5 mL/min (by C-G formula based on SCr of 0.64 mg/dL).    Allergies  Allergen Reactions   Penicillins     Shortness of breath   Sulfa Antibiotics     Blisters    Acetaminophen Other (See Comments)    Caused problems with urinary tract   Procaine Other (See Comments)    Caused fatigue    Antimicrobials this admission: Vancomycin 1/15>>  Thank you for allowing pharmacy to be a part of this patient's care.  Dalene Carrow 07/22/2023 8:44 AM

## 2023-07-22 NOTE — Progress Notes (Signed)
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  He admitted to having mild bilateral lower extremity swelling at baseline but stated this was significantly worse.  In the ER the patient was afebrile but had an elevated WBC count of 12.1.  Bilateral foot plain film x-rays were without evidence of osteomyelitis.  Goals of Care:   Code Status: Full Code   DVT prophylaxis:  apixaban (ELIQUIS) tablet 5 mg   Interim Hx: Remains afebrile.  Vital signs stable.  No acute events recorded overnight.  Resting comfortably in bed.  Denies any new complaints.  Feels that he is making good progress.  Is ambulating with assistance.  Assessment & Plan:  Bilateral lower extremity cellulitis with purulence superimposed on bilateral lower extremity venous stasis dermatitis Continue empiric antibiotic therapy -blood cultures negative thus far -clinically improving -transition to oral antibiotics today  DVT/PE October 2024 In the setting of malignancy - venous duplex this admission confirms chronic clot in the left lower extremity (posterior tibial vein) but reveals no acute clot in right or left lower extremity -continue Eliquis  Mild chronic systolic CHF TTE October 2024 noted EF 50-55% and grade 1 DD -TTE this admission notes EF 40-45% with global LV hypokinesis and grade 1 DD -appears euvolemic at this time  DM2 A1c November 2024 6.4 - does not require chronic medications at home -CBG presently well-controlled  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 Mosetta Putt at the cancer center - undergoing chemotherapy and XRT -PEG tube  dependent  Family Communication: No family present at time of exam Disposition: Anticipate eventual discharge home likely 11/20   Objective: Blood pressure 116/71, pulse (!) 101, temperature 98.6 F (37 C), temperature source Oral, resp. rate 18, height 5\' 6"  (1.676 m), weight 76.9 kg, SpO2 96%. No intake or output data in the 24 hours ending 07/22/23 0849  Filed Weights   07/20/23 0008 07/22/23 0459  Weight: 81.1 kg 76.9 kg    Examination: General: No acute respiratory distress Lungs: Clear to auscultation bilaterally  Cardiovascular: Regular rate and rhythm without murmur  Abdomen: Nontender, nondistended, soft, bowel sounds positive, no rebound -PEG tube insertion clean and dry with no worrisome findings Extremities: Stable erythema of forefoot/toes with resolution of edema  CBC: Recent Labs  Lab 07/18/23 2010 07/20/23 0536 07/21/23 0634  WBC 12.1* 9.2 8.1  NEUTROABS 9.6*  --   --   HGB 13.3 11.4* 11.7*  HCT 39.5 34.0* 36.0*  MCV 94.5 95.8 97.6  PLT 114* 81* 73*   Basic Metabolic Panel: Recent Labs  Lab 07/18/23 2010 07/20/23 0531 07/20/23 0536 07/21/23 0634  NA 135  --  134* 139  K 4.6  --  4.0 4.7  CL 100  --  105 107  CO2 23  --  22 26  GLUCOSE 124*  --  105* 108*  BUN 30*  --  20 21  CREATININE 0.89  --  0.71 0.64  CALCIUM 9.0  --  7.8* 8.0*  MG  --  2.1  --  2.2  PHOS  --  3.0  --  3.1   GFR: Estimated Creatinine Clearance: 75.5 mL/min (by C-G  formula based on SCr of 0.64 mg/dL).   Scheduled Meds:  apixaban  5 mg Per Tube BID   famotidine  40 mg Per Tube BID   feeding supplement (OSMOLITE 1.5 CAL)  474 mL Per Tube TID   feeding supplement (PROSource TF20)  45 mL Per Tube BID   free water  180 mL Per Tube 6 X Daily   insulin aspart  0-5 Units Subcutaneous QHS   insulin aspart  0-9 Units Subcutaneous TID WC   Continuous Infusions:  vancomycin 750 mg (07/22/23 0740)     LOS: 3 days   Lonia Blood, MD [facility] Office   [phone] Pager - Text Page per Loretha Stapler  If 7PM-7AM, please contact night-coverage per Amion 07/22/2023, 8:49 AM

## 2023-07-23 DIAGNOSIS — L03119 Cellulitis of unspecified part of limb: Secondary | ICD-10-CM | POA: Diagnosis not present

## 2023-07-23 LAB — CBC
HCT: 33 % — ABNORMAL LOW (ref 39.0–52.0)
Hemoglobin: 11.2 g/dL — ABNORMAL LOW (ref 13.0–17.0)
MCH: 32.5 pg (ref 26.0–34.0)
MCHC: 33.9 g/dL (ref 30.0–36.0)
MCV: 95.7 fL (ref 80.0–100.0)
Platelets: 55 10*3/uL — ABNORMAL LOW (ref 150–400)
RBC: 3.45 MIL/uL — ABNORMAL LOW (ref 4.22–5.81)
RDW: 18.5 % — ABNORMAL HIGH (ref 11.5–15.5)
WBC: 7.3 10*3/uL (ref 4.0–10.5)
nRBC: 0 % (ref 0.0–0.2)

## 2023-07-23 LAB — CULTURE, BLOOD (ROUTINE X 2)
Culture: NO GROWTH
Culture: NO GROWTH
Special Requests: ADEQUATE
Special Requests: ADEQUATE

## 2023-07-23 LAB — GLUCOSE, CAPILLARY
Glucose-Capillary: 107 mg/dL — ABNORMAL HIGH (ref 70–99)
Glucose-Capillary: 143 mg/dL — ABNORMAL HIGH (ref 70–99)

## 2023-07-23 MED ORDER — OXYCODONE HCL 5 MG PO TABS: 5.0000 mg | ORAL_TABLET | Freq: Four times a day (QID) | ORAL | 0 refills | Status: DC | PRN

## 2023-07-23 MED ORDER — APIXABAN 5 MG PO TABS: 5.0000 mg | ORAL_TABLET | Freq: Two times a day (BID) | ORAL | 1 refills | Status: DC

## 2023-07-23 MED ORDER — LACTULOSE 10 GM/15ML PO SOLN: 20.0000 g | Freq: Two times a day (BID) | ORAL | 0 refills | Status: DC | PRN

## 2023-07-23 MED ORDER — ACETAMINOPHEN 160 MG/5ML PO SOLN: 650.0000 mg | Freq: Four times a day (QID) | ORAL | Status: DC | PRN

## 2023-07-23 MED ORDER — DOXYCYCLINE HYCLATE 100 MG PO TABS: 100.0000 mg | ORAL_TABLET | Freq: Two times a day (BID) | ORAL | 0 refills | Status: AC

## 2023-07-23 MED ORDER — FAMOTIDINE 40 MG PO TABS: 40.0000 mg | ORAL_TABLET | Freq: Two times a day (BID) | ORAL | 1 refills | Status: DC

## 2023-07-23 NOTE — Discharge Summary (Signed)
DISCHARGE SUMMARY  Samuel [name]  MR#: [id number]  DOB:[birth year]  Date of Admission: 07/18/2023 Date of Discharge: 07/23/2023  Attending Physician:Kahne Helfand Silvestre Gunner, MD  Patient's NWG:[name], Samuel Horns, MD  Disposition: d/c home   Follow-up Appts:  Follow-up Information     [name], MD Follow up in 1 week(s).   Specialty: Family Medicine Contact information: 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 He admitted to having mild bilateral lower extremity swelling at baseline but stated this was significantly worse. In the ER the patient was afebrile but had an elevated WBC count of 12.1. Bilateral foot plain film x-rays were without evidence of osteomyelitis.   Hospital Course:  Bilateral lower extremity cellulitis with purulence superimposed on bilateral lower extremity venous stasis dermatitis Continue empiric antibiotic therapy, with transition to oral antibiotic prior to discharge which was well-tolerated - blood cultures remain negativ - clinically dramatically improved during the course of this hospital stay -transitioned to oral antibiotic 1/18 -patient to elevate legs at home and instructed on proper care of his legs by RN prior to discharge   DVT/PE October 2024 In the setting of malignancy - venous duplex this admission confirmed chronic  clot in the left lower extremity (posterior tibial vein) but revealed no acute clot in right or left lower extremity - continue Eliquis   Mild chronic systolic CHF TTE October 2024 noted EF 50-55% and grade 1 DD -TTE this admission notes EF 40-45% with global LV hypokinesis and grade 1 DD -clinically euvolemic during this admission with no evidence of acute CHF exacerbation   DM2 A1c November 2024 6.4 - does not require chronic medications at home -CBG well-controlled during this admission   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 Mosetta Putt at the cancer center - undergoing chemotherapy and XRT - PEG tube dependent -no acute complications this admission   Allergies as of 07/23/2023       Reactions   Penicillins    Shortness of breath   Sulfa Antibiotics    Blisters   Acetaminophen Other (See Comments)   Caused problems with urinary tract   Procaine Other (See Comments)   Caused fatigue        Medication List     TAKE these medications    acetaminophen 160 MG/5ML solution Commonly known as: TYLENOL Place 20.3 mLs (650 mg total) into feeding tube every 6 (six) hours as needed for mild pain (pain score 1-3), headache or fever.   apixaban 5 MG Tabs tablet Commonly known as: ELIQUIS Place 1 tablet (5 mg total) into feeding tube 2 (two) times daily.   doxycycline 100 MG tablet Commonly known as: VIBRA-TABS Place 1 tablet (100 mg total) into feeding tube every 12 (twelve) hours for 5 days.   famotidine 40 MG tablet Commonly known as: PEPCID Place 1 tablet (40 mg total) into feeding tube 2 (two) times daily. Replaces:  famotidine 40 MG/5ML suspension   free water Soln Place 120 mLs into feeding tube 6 (six) times daily.   lactulose 10 GM/15ML solution Commonly known as: CHRONULAC Place 30 mLs (20 g total) into feeding tube 2 (two) times daily as needed for moderate constipation.   ondansetron 4 MG tablet Commonly known as:  Zofran Take 1 tablet (4 mg total) by mouth every 8 (eight) hours as needed for nausea or vomiting.   oxyCODONE 5 MG immediate release tablet Commonly known as: Oxy IR/ROXICODONE Place 1 tablet (5 mg total) into feeding tube every 6 (six) hours as needed for moderate pain (pain score 4-6).   PRESCRIPTION MEDICATION Apply 1 application  topically as needed (appy to feet and ankles). Sonafine Wound Dressing   PROSOURCE TF PO Place 1 packet into feeding tube with breakfast, with lunch, and with evening meal. Use with the last portion of tube feeding; Give via feeding tube towards the end of tube feeding; flush with (60ml) sterile water; then give 1 packet of Prosource TF protein packet and flush with (60ml) sterile water. IF needed flush with additional (30ml) sterile water.   OSMOLITE 1.5 CAL PO Place 474 mLs into feeding tube 3 (three) times daily. Give via feeding tube towards the end of tube feeding; flush with (60ml) sterile water; then give 1 packet of Prosource TF protein packet and flush with (60ml) sterile water. IF needed flush with additional (30ml) sterile water.        Day of Discharge BP 109/87 (BP Location: Left Arm)   Pulse 96   Temp 98 F (36.7 C) (Oral)   Resp 16   Ht 5\' 6"  (1.676 m)   Wt 76.9 kg   SpO2 97%   BMI 27.36 kg/m   Physical Exam: General: No acute respiratory distress Lungs: Clear to auscultation bilaterally without wheezes or crackles Cardiovascular: Regular rate and rhythm without murmur gallop or rub normal S1 and S2 Abdomen: Nontender, nondistended, soft, bowel sounds positive, no rebound, no ascites, no appreciable mass Extremities: Some mild persisting erythema bilateral lower extremities in a circumferential fashion mid shin but markedly improved from time of admission with complete resolution of edema and no significant open wounds purulence or fluctuance  Basic Metabolic Panel: Recent Labs  Lab 07/18/23 2010 07/20/23 0531  07/20/23 0536 07/21/23 0634  NA 135  --  134* 139  K 4.6  --  4.0 4.7  CL 100  --  105 107  CO2 23  --  22 26  GLUCOSE 124*  --  105* 108*  BUN 30*  --  20 21  CREATININE 0.89  --  0.71 0.64  CALCIUM 9.0  --  7.8* 8.0*  MG  --  2.1  --  2.2  PHOS  --  3.0  --  3.1    CBC: Recent Labs  Lab 07/18/23 2010 07/20/23 0536 07/21/23 0634 07/23/23 0455  WBC 12.1* 9.2 8.1 7.3  NEUTROABS 9.6*  --   --   --   HGB 13.3 11.4* 11.7* 11.2*  HCT 39.5 34.0* 36.0* 33.0*  MCV 94.5 95.8 97.6 95.7  PLT 114* 81* 73* 55*    Recent Results (from the past 240 hours)  Culture, blood (routine x 2)     Status: None   Collection Time: 07/18/23  5:58 PM   Specimen: BLOOD  Result Value Ref Range Status   Specimen Description BLOOD LEFT ANTECUBITAL  Final   Special Requests   Final  BOTTLES DRAWN AEROBIC AND ANAEROBIC Blood Culture adequate volume   Culture   Final    NO GROWTH 5 DAYS Performed at [facility], [address] [place], [place] [address]    Report Status 07/23/2023 FINAL  Final  Culture, blood (routine x 2)     Status: None   Collection Time: 07/18/23  6:03 PM   Specimen: BLOOD  Result Value Ref Range Status   Specimen Description BLOOD RIGHT ANTECUBITAL  Final   Special Requests   Final    BOTTLES DRAWN AEROBIC AND ANAEROBIC Blood Culture adequate volume   Culture   Final    NO GROWTH 5 DAYS Performed at [facility], [address] [place], [place] [address]    Report Status 07/23/2023 FINAL  Final      Time spent in discharge (includes decision making & examination of pt): 35 minutes  07/23/2023, 12:00 PM   Lonia Blood, MD [facility] Office  [phone]

## 2023-07-24 ENCOUNTER — Encounter: Payer: Self-pay | Admitting: Hematology

## 2023-07-24 ENCOUNTER — Telehealth: Payer: Self-pay

## 2023-07-24 ENCOUNTER — Telehealth: Payer: Self-pay | Admitting: *Deleted

## 2023-07-24 NOTE — Transitions of Care (Post Inpatient/ED Visit) (Signed)
07/24/2023  Name: Samuel [name] MRN: [id number] DOB: 1945-11-24  Today's TOC FU Call Status: Today's TOC FU Call Status:: Successful TOC FU Call Completed TOC FU Call Complete Date: 07/24/23 Patient's Name and Date of Birth confirmed.  Transition Care Management Follow-up Telephone Call Date of Discharge: 07/23/23 Discharge Facility: [facility]) Type of Discharge: Inpatient Admission Primary Inpatient Discharge Diagnosis:: cellulitus How have you been since you were released from the hospital?: Better Any questions or concerns?: No  Items Reviewed: Did you receive and understand the discharge instructions provided?: No (Patient didn't understand the appointments. RN went over and explained) Medications obtained,verified, and reconciled?: Yes (Medications Reviewed) Any new allergies since your discharge?: No Dietary orders reviewed?: Yes Type of Diet Ordered:: Npo  Osmolite and prosource via PEG Do you have support at home?: Yes People in Home: significant other Name of Support/Comfort Primary Source: Maralyn Sago  Medications Reviewed Today: Medications Reviewed Today     Reviewed by Luella Cook, RN (Case Manager) on 07/24/23 at 1711  Med List Status: <None>   Medication Order Taking? Sig Documenting Provider Last Dose Status Informant  acetaminophen (TYLENOL) 160 MG/5ML solution [id number] No Place 20.3 mLs (650 mg total) into feeding tube every 6 (six) hours as needed for mild pain (pain score 1-3), headache or fever.  Patient not taking: Reported on 07/24/2023   Lonia Blood, MD Not Taking Active            Med Note Orson Aloe Jul 24, 2023  3:56 PM) He said he will not take tylenol  apixaban (ELIQUIS) 5 MG TABS tablet [id number] Yes Place 1 tablet (5 mg total) into feeding tube 2 (two) times daily. Lonia Blood, MD Taking Active   doxycycline (VIBRA-TABS) 100 MG tablet [id number] Yes Place 1 tablet (100 mg total) into feeding tube every 12  (twelve) hours for 5 days. Lonia Blood, MD Taking Active   famotidine (PEPCID) 40 MG tablet [id number] Yes Place 1 tablet (40 mg total) into feeding tube 2 (two) times daily. Lonia Blood, MD Taking Active   lactulose [facility]) 10 GM/15ML solution [id number] Yes Place 30 mLs (20 g total) into feeding tube 2 (two) times daily as needed for moderate constipation. Lonia Blood, MD Taking Active   Nutritional Supplements (OSMOLITE 1.5 CAL PO) [id number] Yes Place 474 mLs into feeding tube 3 (three) times daily. Give via feeding tube towards the end of tube feeding; flush with (60ml) sterile water; then give 1 packet of Prosource TF protein packet and flush with (60ml) sterile water. IF needed flush with additional (30ml) sterile water. [provider] Taking Active Self, Pharmacy Records  Nutritional Supplements (PROSOURCE TF PO) [id number] Yes Place 1 packet into feeding tube with breakfast, with lunch, and with evening meal. Use with the last portion of tube feeding; Give via feeding tube towards the end of tube feeding; flush with (60ml) sterile water; then give 1 packet of Prosource TF protein packet and flush with (60ml) sterile water. IF needed flush with additional (30ml) sterile water. [provider] Taking Active Self, Pharmacy Records  ondansetron (ZOFRAN) 4 MG tablet [id number] Yes Take 1 tablet (4 mg total) by mouth every 8 (eight) hours as needed for nausea or vomiting. Carlean Jews, NP Taking Active Self, Pharmacy Records  oxyCODONE (OXY IR/ROXICODONE) 5 MG immediate release tablet [id number] Yes Place 1 tablet (5 mg total) into feeding tube every 6 (six) hours as needed for moderate  pain (pain score 4-6). Lonia Blood, MD Taking Active   PRESCRIPTION MEDICATION [id number] No Apply 1 application  topically as needed (appy to feet and ankles). Sonafine Wound Dressing  Patient not taking: Reported on 07/24/2023   [provider] Not  Taking Active Self, Pharmacy Records           Med Note Orson Aloe Jul 24, 2023  3:57 PM) He said he does not want to apply any creams as they just " complicate things."   Water For Irrigation, Sterile (FREE WATER) SOLN [id number] Yes Place 120 mLs into feeding tube 6 (six) times daily. Azucena Fallen, MD Taking Active Self, Pharmacy Records           Med Note (LEE, NICOLE   Wed Jul 19, 2023  6:48 AM)              Home Care and Equipment/Supplies: Were Home Health Services Ordered?: No Name of Home Health Agency:: [facility] Any new equipment or medical supplies ordered?: No  Functional Questionnaire: Do you need assistance with bathing/showering or dressing?: No Do you need assistance with meal preparation?: No Do you need assistance with eating?: No Do you have difficulty maintaining continence: No Do you need assistance with getting out of bed/getting out of a chair/moving?: Yes Do you have difficulty managing or taking your medications?: No  Follow up appointments reviewed: PCP Follow-up appointment confirmed?: Yes Date of PCP follow-up appointment?: 08/16/23 Follow-up Provider: Dr [facility] Follow-up appointment confirmed?: Yes Date of Specialist follow-up appointment?: 07/28/23 Follow-Up Specialty Provider:: [id number] Brynda [facility]athouse 2:10  [id number] [facility] 9:00 AM  [id number] PET 12:00 Do you need transportation to your follow-up appointment?: No Do you understand care options if your condition(s) worsen?: Yes-patient verbalized understanding  SDOH Interventions Today    Flowsheet Row Most Recent Value  SDOH Interventions   Food Insecurity Interventions Intervention Not Indicated  Housing Interventions Intervention Not Indicated  Transportation Interventions Intervention Not Indicated  Utilities Interventions Intervention Not Indicated      Interventions Today    Flowsheet Row Most Recent Value  Chronic Disease   Chronic  disease during today's visit Other  [cellulitus]  General Interventions   General Interventions Discussed/Reviewed Doctor Visits, General Interventions Discussed, General Interventions Reviewed  Doctor Visits Discussed/Reviewed Doctor Visits Discussed, Doctor Visits Reviewed  Education Interventions   Education Provided Provided Education  Provided Verbal Education On Other  [appt]  Nutrition Interventions   Nutrition Discussed/Reviewed Nutrition Discussed  Pharmacy Interventions   Pharmacy Dicussed/Reviewed Pharmacy Topics Discussed, Pharmacy Topics Reviewed      Patient declined further outreach Care management call.  RN emailed patient a copy of appointments   Memory Argue [employer] [facility] Health Care Management Coordinator [email or web address] Direct Dial: [phone]  Fax: [phone] Website: [email or web address]

## 2023-07-24 NOTE — Transitions of Care (Post Inpatient/ED Visit) (Signed)
07/24/2023  Name: Samuel [name] MRN: [id number] DOB: [birth year]  Today's TOC FU Call Status: Today's TOC FU Call Status:: Successful TOC FU Call Completed TOC FU Call Complete Date: 07/24/23 Patient's Name and Date of Birth confirmed.  Transition Care Management Follow-up Telephone Call Date of Discharge: 07/23/23 Discharge Facility: [facility]) Type of Discharge: Inpatient Admission Primary Inpatient Discharge Diagnosis:: cellulitis How have you been since you were released from the hospital?: Better Any questions or concerns?: No  Items Reviewed: Did you receive and understand the discharge instructions provided?: Yes Medications obtained,verified, and reconciled?: Yes (Medications Reviewed) (He did not have any questions about the med regime,) Any new allergies since your discharge?: No Dietary orders reviewed?: Yes Type of Diet Ordered:: NPO- receives osmolite and prosource via PEG which he administers himself. Do you have support at home?: Yes People in Home: spouse Name of Support/Comfort Primary Source: his wife is the primary source of support and he said he also receives alot of  support from the community  Medications Reviewed Today: Medications Reviewed Today     Reviewed by Robyne Peers, RN (Case Manager) on 07/24/23 at 1555  Med List Status: <None>   Medication Order Taking? Sig Documenting Provider Last Dose Status Informant  acetaminophen (TYLENOL) 160 MG/5ML solution [id number]  Place 20.3 mLs (650 mg total) into feeding tube every 6 (six) hours as needed for mild pain (pain score 1-3), headache or fever. Lonia Blood, MD  Active   apixaban (ELIQUIS) 5 MG TABS tablet [id number]  Place 1 tablet (5 mg total) into feeding tube 2 (two) times daily. Lonia Blood, MD  Active   doxycycline (VIBRA-TABS) 100 MG tablet [id number]  Place 1 tablet (100 mg total) into feeding tube every 12 (twelve) hours for 5 days. Lonia Blood, MD  Active    famotidine (PEPCID) 40 MG tablet [id number]  Place 1 tablet (40 mg total) into feeding tube 2 (two) times daily. Lonia Blood, MD  Active   lactulose [facility]) 10 GM/15ML solution [id number]  Place 30 mLs (20 g total) into feeding tube 2 (two) times daily as needed for moderate constipation. Lonia Blood, MD  Active   Nutritional Supplements (OSMOLITE 1.5 CAL PO) [id number] No Place 474 mLs into feeding tube 3 (three) times daily. Give via feeding tube towards the end of tube feeding; flush with (60ml) sterile water; then give 1 packet of Prosource TF protein packet and flush with (60ml) sterile water. IF needed flush with additional (30ml) sterile water. [provider] 07/18/2023 Morning Active Self, Pharmacy Records  Nutritional Supplements (PROSOURCE TF PO) [id number] No Place 1 packet into feeding tube with breakfast, with lunch, and with evening meal. Use with the last portion of tube feeding; Give via feeding tube towards the end of tube feeding; flush with (60ml) sterile water; then give 1 packet of Prosource TF protein packet and flush with (60ml) sterile water. IF needed flush with additional (30ml) sterile water. [provider] 07/18/2023 Morning Active Self, Pharmacy Records  ondansetron (ZOFRAN) 4 MG tablet [id number] No Take 1 tablet (4 mg total) by mouth every 8 (eight) hours as needed for nausea or vomiting. Carlean Jews, NP Past Week Active Self, Pharmacy Records  oxyCODONE (OXY IR/ROXICODONE) 5 MG immediate release tablet [id number]  Place 1 tablet (5 mg total) into feeding tube every 6 (six) hours as needed for moderate pain (pain score 4-6). Lonia Blood, MD  Active   PRESCRIPTION MEDICATION [id number]  No Apply 1 application  topically as needed (appy to feet and ankles). Sonafine Wound Dressing [provider] Past Week Active Self, Pharmacy Records  Water For Irrigation, Sterile (FREE WATER) SOLN [id number] No Place 120 mLs into  feeding tube 6 (six) times daily. Azucena Fallen, MD 07/18/2023 Noon Active Self, Pharmacy Records           Med Note (LEE, NICOLE   Wed Jul 19, 2023  6:48 AM)              Home Care and Equipment/Supplies: Were Home Health Services Ordered?: No Name of Home Health Agency:: he receives his tube feeding supplies from [facility] Any new equipment or medical supplies ordered?: No  Functional Questionnaire: Do you need assistance with bathing/showering or dressing?: No (He said his feet are much improved, decreased swelling, itching and pain.He explained he has a wound wash to clean the dead skin and then uses Witch hazel and has socks that he has cut to best fit his feet.  He does not use any cream or gauze dressings.) Do you need assistance with meal preparation?: No (NPO- administers his own tube feedings) Do you need assistance with eating?: No (NPO) Do you have difficulty maintaining continence: No Do you need assistance with getting out of bed/getting out of a chair/moving?: Yes (He has a RW for ambulation indoors. He would like a power wheelchair and will need to discuss that request with PCP.) Do you have difficulty managing or taking your medications?: No  Follow up appointments reviewed: PCP Follow-up appointment confirmed?: Yes Date of PCP follow-up appointment?: 08/16/23 Follow-up Provider: Dr [facility] Follow-up appointment confirmed?: Yes Date of Specialist follow-up appointment?: 07/28/23 Follow-Up Specialty Provider:: call from [facility].  He also has a [facility] with the [facility] 08/[facility]/2023. Do you need transportation to your follow-up appointment?: No (He said he has been getting transpotation from [facility].  He can also use Medicaid transportation) Do you understand care options if your condition(s) worsen?: Yes-patient verbalized understanding    SIGNATURE Robyne Peers, RN

## 2023-07-28 ENCOUNTER — Other Ambulatory Visit: Payer: Self-pay

## 2023-07-28 ENCOUNTER — Ambulatory Visit (INDEPENDENT_AMBULATORY_CARE_PROVIDER_SITE_OTHER): Payer: 59 | Admitting: Thoracic Surgery (Cardiothoracic Vascular Surgery)

## 2023-07-28 DIAGNOSIS — C155 Malignant neoplasm of lower third of esophagus: Secondary | ICD-10-CM | POA: Diagnosis not present

## 2023-07-28 NOTE — Progress Notes (Signed)
     [address]       [place], [place] [address]             [phone]       Patient: Home Provider: Office Consent for Telemedicine visit obtained.  Today's visit was completed via a real-time telehealth (see specific modality noted below). The patient/authorized person provided oral consent at the time of the visit to engage in a telemedicine encounter with the present provider at [facility]. The patient/authorized person was informed of the potential benefits, limitations, and risks of telemedicine. The patient/authorized person expressed understanding that the laws that protect confidentiality also apply to telemedicine. The patient/authorized person acknowledged understanding that telemedicine does not provide emergency services and that he or she would need to call 911 or proceed to the nearest hospital for help if such a need arose.   Total time spent in the clinical discussion 10 minutes.  Telehealth Modality: Phone visit (audio only)  I had a telephone visit with Samuel [name].  He was recently discharged from the hospital for lower ext cellulitis and infection.  He is also severely malnourished based off of his nutritional labs.  He agrees that he does not want surgery.  He will continue to follow-up with medical oncology.

## 2023-07-31 ENCOUNTER — Ambulatory Visit
Admission: RE | Admit: 2023-07-31 | Discharge: 2023-07-31 | Disposition: A | Discharge status: Home / Self Care | Payer: 59 | Source: Ambulatory Visit | Attending: Radiation Oncology | Admitting: Radiation Oncology

## 2023-07-31 DIAGNOSIS — Z931 Gastrostomy status: Secondary | ICD-10-CM | POA: Insufficient documentation

## 2023-07-31 DIAGNOSIS — Z5111 Encounter for antineoplastic chemotherapy: Secondary | ICD-10-CM | POA: Insufficient documentation

## 2023-07-31 DIAGNOSIS — Z7901 Long term (current) use of anticoagulants: Secondary | ICD-10-CM | POA: Insufficient documentation

## 2023-07-31 DIAGNOSIS — E43 Unspecified severe protein-calorie malnutrition: Secondary | ICD-10-CM | POA: Insufficient documentation

## 2023-07-31 DIAGNOSIS — Z510 Encounter for antineoplastic radiation therapy: Secondary | ICD-10-CM | POA: Insufficient documentation

## 2023-07-31 DIAGNOSIS — C155 Malignant neoplasm of lower third of esophagus: Secondary | ICD-10-CM | POA: Insufficient documentation

## 2023-07-31 NOTE — Progress Notes (Signed)
  Radiation Oncology         [phone] ________________________________  Name: Samuel [name] MRN: [id number]  Date of Service: 07/31/2023  DOB: Dec 03, 1945  Post Treatment Telephone Note  Diagnosis:  cT3N0M0, Adenocarcinoma of the distal esophagus  (as documented in provider EOT note)  The patient was available for call today.   Symptoms of fatigue have improved since completing therapy.  Symptoms of skin changes have improved since completing therapy.  Symptoms of difficulty swallowing have improved since completing therapy.  The patient has scheduled follow up with his medical oncologist Dr. Mosetta Putt for ongoing surveillance, and with Dr. Basilio Cairo. The patient was encouraged to call if he  develops concerns or questions regarding radiation.   This concludes the interaction.  Ruel Favors, LPN

## 2023-08-01 ENCOUNTER — Ambulatory Visit: Payer: 59 | Admitting: Nutrition

## 2023-08-01 NOTE — Progress Notes (Signed)
Telephone follow-up completed with patient.  Status post chemotherapy and radiation treatments for esophageal cancer.  He is followed by Dr. Mosetta Putt and Dr. Mitzi Hansen.  Noted glucose on January 19: 143. Albumin on January 17: 2.0.  Patient reports his weight is stable on home scale at approximately 165 pounds.  He feels well and is happy with his weight.  Reports he has good energy.  He is tolerating 2 cartons of Osmolite 1.5-3 times a day via PEG.  He also gives 45 mL Prosource TF 3 times daily. He wants to maximize his protein intake.  Verbalizes appreciation for nutrition information and has been pleased with the dietitians he has seen.  He is hoping for a good result from his PET scan which is scheduled on January 30.  Home health agency: [facility].  Diet: NPO.  Estimated nutrition needs:  2100-2300 cal, 110-125 g protein, greater than 2.2 L fluid.  6 cartons Osmolite 1.5+45 mL Prosource TF 20 provides 2370 cal (31.6 kcal/kg), 149.4 gm protein (2 gm/kg)  Nutrition diagnosis: Severe malnutrition, stable.  Intervention: Continue Osmolite 1.5, 2 cartons, 3 times daily via PEG. Continue 45 mL Prosource TF20 3 times daily via PEG. Monitor labs. Continue free water flushes with medications, tube feeding, and protein. NPO per MD.  Monitoring, Evaluation, Goals: Tolerate adequate calorie and protein needs to promote weight stability and continued healing.  Next Visit: Patient encouraged to contact RD with questions/concerns.  **Disclaimer: This note was dictated with voice recognition software. Similar sounding words can inadvertently be transcribed and this note may contain transcription errors which may not have been corrected upon publication of note.**

## 2023-08-02 ENCOUNTER — Encounter: Payer: Self-pay | Admitting: Hematology

## 2023-08-03 ENCOUNTER — Encounter (HOSPITAL_COMMUNITY)
Admission: RE | Admit: 2023-08-03 | Discharge: 2023-08-03 | Disposition: A | Discharge status: Home / Self Care | Payer: 59 | Source: Ambulatory Visit | Attending: Nurse Practitioner | Admitting: Nurse Practitioner

## 2023-08-03 DIAGNOSIS — C155 Malignant neoplasm of lower third of esophagus: Secondary | ICD-10-CM | POA: Insufficient documentation

## 2023-08-03 DIAGNOSIS — C159 Malignant neoplasm of esophagus, unspecified: Secondary | ICD-10-CM | POA: Diagnosis not present

## 2023-08-03 DIAGNOSIS — R1310 Dysphagia, unspecified: Secondary | ICD-10-CM | POA: Diagnosis not present

## 2023-08-03 LAB — GLUCOSE, CAPILLARY: Glucose-Capillary: 93 mg/dL (ref 70–99)

## 2023-08-03 MED ORDER — FLUDEOXYGLUCOSE F - 18 (FDG) INJECTION
8.3300 | Freq: Once | INTRAVENOUS | Status: AC | PRN
  Administered 2023-08-03: 8.33 via INTRAVENOUS

## 2023-08-11 ENCOUNTER — Telehealth: Payer: Self-pay

## 2023-08-11 ENCOUNTER — Telehealth: Payer: Self-pay | Admitting: Family Medicine

## 2023-08-11 NOTE — Telephone Encounter (Addendum)
 Relayed the below message as per Powell Lessen NP. Patients family voiced full understanding with no further questions at this time.  ----- Message from Powell FORBES Lessen sent at 08/11/2023  2:49 PM EST ----- Please let the  patient know that the PET scan shows near resolution of the original  tumor with some benign appearing activity above the area where the tumor was. There is no evidence of metastatic disease at this time. I will talk with Dr. Lanny on  Monday to talk about surveillance schedule. Thank you so much. Powell, NP

## 2023-08-11 NOTE — Telephone Encounter (Signed)
 Copied from CRM [id number]. Topic: Clinical - Lab/Test Results >> Aug 11, 2023  1:33 PM Fredrica W wrote: Reason for CRM: Patient called states he has been expecting to hear back from results of PET Scan but have not heard anything back. Unable to access on MyChart due to Arthritis. Thank You

## 2023-08-11 NOTE — Telephone Encounter (Signed)
 Contacted pt confirm appt!

## 2023-08-14 ENCOUNTER — Inpatient Hospital Stay: Payer: 59 | Attending: Hematology

## 2023-08-14 ENCOUNTER — Encounter: Payer: Self-pay | Admitting: Nutrition

## 2023-08-14 ENCOUNTER — Telehealth: Payer: Self-pay

## 2023-08-14 NOTE — Progress Notes (Signed)
 Received message that patient requests phone call due to questions regarding transitioning to oral diet. He has not called or left message with this Clinical research associate. Patient will be contacted by a member of the RD team to answer questions. He has completed treatment.

## 2023-08-14 NOTE — Telephone Encounter (Signed)
 Copied from CRM [id number]. Topic: Clinical - Medical Advice >> Aug 14, 2023 11:26 AM Samuel [name] wrote: Reason for CRM: patient stated he is trying to transition to a regular diet and has not be able to reach Lady Pier the nutritionist. Patient is requesting a call back to discuss this transition

## 2023-08-14 NOTE — Progress Notes (Signed)
 Nutrition Follow-up:  Patient with esophageal cancer.  Followed by Dr Maryalice Smaller and Jeryl Moris.  Patient has completed treatment  Received message that patient was trying to reach Dietitian.   Called and spoke with patient by phone.  Reports that he is wanting to transition off tube feeding to oral diet.  Has tried liquids and done well with them.     Anthropometrics:   Weight 169 lb 8.5 oz on 1/18 173 lb on 12/30 170 lb on 11/25 179 lb on 11/19   Estimated Energy Needs  Kcals: 2100-2300 Protein: 110-125 g,  Fluid: > 2.2 L    INTERVENTION:  Recommend patient check weight weekly and if weight staying the same or going up can subtract 1 carton of tube feeding for the next week.   Educated patient on number of calories that each carton of tube feeding provides and that he will need to make up those calories with foods eating orally.  He verbalized understanding.   Patient will start with liquid, moist foods (soups, cream of wheat mixed with 1% milk, etc).   Can try boost plus and ensure plus as well orally.   Continue prosource via PEG TID at this time.  Provided patient with Dietitians contact information both Felipa Horsfall Neff's direct contact and myself.    MONITORING, EVALUATION, GOAL: weight trends, intake, tube feeding    NEXT VISIT: Monday, Feb 17 phone call  Eleyna Brugh B. Leighton Punches, RD, LDN Registered Dietitian [phone]

## 2023-08-16 ENCOUNTER — Encounter: Payer: Self-pay | Admitting: Family Medicine

## 2023-08-16 ENCOUNTER — Ambulatory Visit: Payer: 59 | Attending: Family Medicine | Admitting: Family Medicine

## 2023-08-16 VITALS — BP 133/70 | HR 99 | Ht 66.0 in

## 2023-08-16 DIAGNOSIS — C155 Malignant neoplasm of lower third of esophagus: Secondary | ICD-10-CM | POA: Diagnosis not present

## 2023-08-16 DIAGNOSIS — L03119 Cellulitis of unspecified part of limb: Secondary | ICD-10-CM | POA: Diagnosis not present

## 2023-08-16 DIAGNOSIS — R531 Weakness: Secondary | ICD-10-CM | POA: Diagnosis not present

## 2023-08-16 DIAGNOSIS — L299 Pruritus, unspecified: Secondary | ICD-10-CM

## 2023-08-16 DIAGNOSIS — Z7409 Other reduced mobility: Secondary | ICD-10-CM | POA: Diagnosis not present

## 2023-08-16 MED ORDER — HYDROCORTISONE 0.5 % EX CREA: 1.0000 | TOPICAL_CREAM | Freq: Two times a day (BID) | CUTANEOUS | 1 refills | Status: DC

## 2023-08-16 NOTE — Progress Notes (Signed)
Subjective:  Patient ID: Samuel [name], male    DOB: 10/16/1945  Age: 78 y.o. MRN: [id number]  CC: Hospitalization Follow-up   HPI Amari Zagal is a 78 y.o. year old male with a history of bilateral knee arthroscopic surgery, fracture of distal phalanx of the middle finger of the left hand in 10/2021, type 2 diabetes mellitus (A1c 6.4), melanoma, esophageal cancer (on chemo and radiation), status post PEG tube, PE (on anticoagulation with Eliquis)   Interval History: Discussed the use of AI scribe software for clinical note transcription with the patient, who gave verbal consent to proceed.  Last month he was hospitalized for bilateral lower extremity cellulitis and treated with antibiotics.  He reports resolution of the condition.  He presents with complaints of itching, particularly in his hands. He attributes this itching to his medication, Oxycodone, which he has been taking for several weeks. The itching tends to occur when his hands get cold and then start to warm up. He also experiences numbness in his feet, which he describes as feeling like a heavy-duty callus. However, he does not find this uncomfortable and, in fact, appreciates the protection it offers when walking on rough ground.  The patient has recently completed chemotherapy and radiation treatment for his cancer.  but he still experiences difficulty swallowing due to esophagitis. As a result, he is currently feeding through a PEG tube and is gradually transitioning to eating by mouth with the help of a dietician. He is currently on famotidine. PET scan from 08/2023 revealed: IMPRESSION: 1. Localized esophageal activity in a 3 cm segment of the mid to distal esophagus, maximum SUV 6.5, mostly above the site of the original tumor although with minimal overlap. Legrand Rams most or all of this being physiologic. The dominant activity in the distal esophagus shown on the prior exam has resolved, as has the  proximal esophageal dilatation. 2. No findings of metastatic disease. 3. Cholelithiasis. 4. Sigmoid colon diverticulosis. 5. Prostatomegaly. 6. Subacute healing fracture left anterior eighth rib near the costochondral junction. 7. Aortic atherosclerosis.  Mr. Marucci also expresses interest in obtaining a power wheelchair. He reports experiencing fatigue and weakness, which makes it difficult for him to move around, particularly in large spaces like [employer]. He has previously explored this option but would like to pursue it further.        Past Medical History:  Diagnosis Date   Diabetes mellitus without complication (HCC)    Esophageal cancer [facility])    Pulmonary embolism (HCC)    In October 2024    Past Surgical History:  Procedure Laterality Date   BIOPSY  04/18/2023   Procedure: BIOPSY;  Surgeon: Lynann Bologna, DO;  Location: [facility] ENDOSCOPY;  Service: Gastroenterology;;   ESOPHAGOGASTRODUODENOSCOPY N/A 04/18/2023   Procedure: ESOPHAGOGASTRODUODENOSCOPY (EGD);  Surgeon: Lynann Bologna, DO;  Location: [facility] ENDOSCOPY;  Service: Gastroenterology;  Laterality: N/A;   IR GASTROSTOMY TUBE MOD SED  04/20/2023   IR NASO G TUBE PLC W/FL W/RAD  04/20/2023   KNEE ARTHROPLASTY Bilateral     Family History  Problem Relation Age of Onset   Hypertension Father    COPD Father    COPD Sister    Diabetes Maternal Grandfather     Social History   Socioeconomic History   Marital status: Married    Spouse name: Not on file   Number of children: Not on file   Years of education: Not on file   Highest education level: Not on file  Occupational  History   Not on file  Tobacco Use   Smoking status: Former    Current packs/day: 0.00    Average packs/day: 1 pack/day for 15.0 years (15.0 ttl pk-yrs)    Types: Cigarettes    Start date: 06/14/1970    Quit date: 06/14/1985    Years since quitting: 38.1   Smokeless tobacco: Never  Substance and Sexual Activity   Alcohol use: Yes     Alcohol/week: 1.0 - 2.0 standard drink of alcohol    Types: 1 - 2 Cans of beer per week   Drug use: No   Sexual activity: Not Currently  Other Topics Concern   Not on file  Social History Narrative   Not on file   Social Drivers of Health   Financial Resource Strain: Low Risk  (06/02/2023)   Overall Financial Resource Strain (CARDIA)    Difficulty of Paying Living Expenses: Not very hard  Food Insecurity: No Food Insecurity (07/24/2023)   Hunger Vital Sign    Worried About Running Out of Food in the Last Year: Never true    Ran Out of Food in the Last Year: Never true  Transportation Needs: No Transportation Needs (07/24/2023)   PRAPARE - Administrator, Civil Service (Medical): No    Lack of Transportation (Non-Medical): No  Recent Concern: Transportation Needs - Unmet Transportation Needs (06/02/2023)   PRAPARE - Transportation    Lack of Transportation (Medical): Yes    Lack of Transportation (Non-Medical): Yes  Physical Activity: Sufficiently Active (08/19/2022)   Exercise Vital Sign    Days of Exercise per Week: 3 days    Minutes of Exercise per Session: 60 min  Stress: No Stress Concern Present (08/19/2022)   [employer] of Occupational Health - Occupational Stress Questionnaire    Feeling of Stress : Not at all  Social Connections: Moderately Integrated (07/19/2023)   Social Connection and Isolation Panel [NHANES]    Frequency of Communication with Friends and Family: More than three times a week    Frequency of Social Gatherings with Friends and Family: More than three times a week    Attends Religious Services: Never    Database administrator or Organizations: Yes    Attends Engineer, structural: More than 4 times per year    Marital Status: Married    Allergies  Allergen Reactions   Penicillins     Shortness of breath   Sulfa Antibiotics     Blisters    Acetaminophen Other (See Comments)    Caused problems with urinary tract    Procaine Other (See Comments)    Caused fatigue    Outpatient Medications Prior to Visit  Medication Sig Dispense Refill   acetaminophen (TYLENOL) 160 MG/5ML solution Place 20.3 mLs (650 mg total) into feeding tube every 6 (six) hours as needed for mild pain (pain score 1-3), headache or fever.     apixaban (ELIQUIS) 5 MG TABS tablet Place 1 tablet (5 mg total) into feeding tube 2 (two) times daily. 60 tablet 1   famotidine (PEPCID) 40 MG tablet Place 1 tablet (40 mg total) into feeding tube 2 (two) times daily. 60 tablet 1   lactulose (CHRONULAC) 10 GM/15ML solution Place 30 mLs (20 g total) into feeding tube 2 (two) times daily as needed for moderate constipation. 236 mL 0   Nutritional Supplements (OSMOLITE 1.5 CAL PO) Place 474 mLs into feeding tube 3 (three) times daily. Give via feeding tube towards the end  of tube feeding; flush with (60ml) sterile water; then give 1 packet of Prosource TF protein packet and flush with (60ml) sterile water. IF needed flush with additional (30ml) sterile water.     Nutritional Supplements (PROSOURCE TF PO) Place 1 packet into feeding tube with breakfast, with lunch, and with evening meal. Use with the last portion of tube feeding; Give via feeding tube towards the end of tube feeding; flush with (60ml) sterile water; then give 1 packet of Prosource TF protein packet and flush with (60ml) sterile water. IF needed flush with additional (30ml) sterile water.     ondansetron (ZOFRAN) 4 MG tablet Take 1 tablet (4 mg total) by mouth every 8 (eight) hours as needed for nausea or vomiting. 30 tablet 2   oxyCODONE (OXY IR/ROXICODONE) 5 MG immediate release tablet Place 1 tablet (5 mg total) into feeding tube every 6 (six) hours as needed for moderate pain (pain score 4-6). 30 tablet 0   PRESCRIPTION MEDICATION Apply 1 application  topically as needed (appy to feet and ankles). Sonafine Wound Dressing     Water For Irrigation, Sterile (FREE WATER) SOLN Place  120 mLs into feeding tube 6 (six) times daily. 24000 mL 0   No facility-administered medications prior to visit.     ROS Review of Systems  Constitutional:  Positive for fatigue. Negative for activity change and appetite change.  HENT:  Negative for sinus pressure and sore throat.   Respiratory:  Negative for chest tightness, shortness of breath and wheezing.   Cardiovascular:  Negative for chest pain and palpitations.  Gastrointestinal:  Negative for abdominal distention, abdominal pain and constipation.  Genitourinary: Negative.   Musculoskeletal: Negative.   Skin:        See HPI  Neurological:  Positive for weakness and numbness.  Psychiatric/Behavioral:  Negative for behavioral problems and dysphoric mood.     Objective:  BP 133/70   Pulse (!) 116   Ht 5\' 6"  (1.676 m)   SpO2 96%   BMI 27.36 kg/m      08/16/2023    3:06 PM 07/23/2023    2:20 PM 07/23/2023    7:35 AM  BP/Weight  Systolic BP 133 101 109  Diastolic BP 70 71 87      Physical Exam Constitutional:      Appearance: He is well-developed.     Comments: Sitting comfortably in a wheelchair, appears chronically ill  Cardiovascular:     Rate and Rhythm: Tachycardia present.     Heart sounds: Normal heart sounds. No murmur heard. Pulmonary:     Effort: Pulmonary effort is normal.     Breath sounds: Normal breath sounds. No wheezing or rales.  Chest:     Chest wall: No tenderness.  Abdominal:     General: Bowel sounds are normal. There is no distension.     Palpations: Abdomen is soft. There is no mass.     Tenderness: There is no abdominal tenderness.  Musculoskeletal:        General: Normal range of motion.     Right lower leg: No edema.     Left lower leg: No edema.  Skin:    Comments: Slight erythema of bilateral lower extremities  Neurological:     Mental Status: He is alert and oriented to person, place, and time.  Psychiatric:        Mood and Affect: Mood normal.        Latest Ref Rng &  Units 07/21/2023  6:34 AM 07/20/2023    5:36 AM 07/18/2023    8:10 PM  CMP  Glucose 70 - 99 mg/dL 161  096  045   BUN 8 - 23 mg/dL 21  20  30    Creatinine 0.61 - 1.24 mg/dL 4.09  8.11  9.14   Sodium 135 - 145 mmol/L 139  134  135   Potassium 3.5 - 5.1 mmol/L 4.7  4.0  4.6   Chloride 98 - 111 mmol/L 107  105  100   CO2 22 - 32 mmol/L 26  22  23    Calcium 8.9 - 10.3 mg/dL 8.0  7.8  9.0   Total Protein 6.5 - 8.1 g/dL 4.6   6.1   Total Bilirubin 0.0 - 1.2 mg/dL 0.8   0.9   Alkaline Phos 38 - 126 U/L 76   91   AST 15 - 41 U/L 24   29   ALT 0 - 44 U/L 15   20     Lipid Panel     Component Value Date/Time   CHOL 190 12/15/2022 1054   TRIG 113 12/15/2022 1054   HDL 59 12/15/2022 1054   LDLCALC 111 (H) 12/15/2022 1054    CBC    Component Value Date/Time   WBC 7.3 07/23/2023 0455   RBC 3.45 (L) 07/23/2023 0455   HGB 11.2 (L) 07/23/2023 0455   HGB 12.3 (L) 07/03/2023 1410   HGB 15.4 08/09/2022 1425   HCT 33.0 (L) 07/23/2023 0455   HCT 46.0 08/09/2022 1425   PLT 55 (L) 07/23/2023 0455   PLT 303 07/03/2023 1410   PLT 281 08/09/2022 1425   MCV 95.7 07/23/2023 0455   MCV 87 08/09/2022 1425   MCH 32.5 07/23/2023 0455   MCHC 33.9 07/23/2023 0455   RDW 18.5 (H) 07/23/2023 0455   RDW 12.8 08/09/2022 1425   LYMPHSABS 0.4 (L) 07/18/2023 2010   LYMPHSABS 2.8 08/09/2022 1425   MONOABS 1.5 (H) 07/18/2023 2010   EOSABS 0.0 07/18/2023 2010   EOSABS 0.4 08/09/2022 1425   BASOSABS 0.1 07/18/2023 2010   BASOSABS 0.1 08/09/2022 1425    Lab Results  Component Value Date   HGBA1C 6.4 (H) 05/23/2023    Assessment & Plan:      Esophageal Cancer Completed chemotherapy and radiation with resolution of the tumor. Persistent esophagitis causing difficulty swallowing and reliance on PEG tube for nutrition. -Continue with dietary modifications as advised by dietician. -Continue Pepcid for esophagitis.  Cellulitis Resolved with residual numbness and callus formation on feet. -No further  intervention required at this time.  Oxycodone-induced Pruritus Reports itching, particularly in hands, associated with oxycodone use. -Prescribe hydrocortisone cream for symptomatic relief. -Advised to discuss this with oncologist for substitution of analgesic  Mobility Issues Reports fatigue and occasional "fuzzy-headedness" leading to concerns about mobility in public spaces such as shopping centers. -Refer to physical therapy for evaluation for power wheelchair.  General Health Maintenance -Ensure follow-up with oncology is scheduled.          Meds ordered this encounter  Medications   hydrocortisone cream 0.5 %    Sig: Apply 1 Application topically 2 (two) times daily.    Dispense:  56 g    Refill:  1    Follow-up: Return for previously scheduled appointment.       [name], MD, FAAFP. [facility] [place], [place] [phone]   08/16/2023, 3:43 PM

## 2023-08-16 NOTE — Patient Instructions (Signed)
VISIT SUMMARY:  Samuel [name], you visited [place] today with concerns about itching in your hands, numbness in your feet, difficulty swallowing, and mobility issues. You have recently completed chemotherapy and radiation for esophageal cancer, and the tumor is gone. We discussed your current symptoms and made plans to address them.  YOUR PLAN:  -ESOPHAGEAL CANCER: Esophageal cancer is a type of cancer that occurs in the esophagus, the tube that carries food from your mouth to your stomach. You have completed chemotherapy and radiation, and the tumor is gone. However, you still have difficulty swallowing due to esophagitis, which is inflammation of the esophagus. Continue with the dietary modifications as advised by your dietician and keep taking Pepcid for esophagitis.  -CELLULITIS: Cellulitis is a bacterial skin infection. Your cellulitis has resolved, but you have residual numbness and callus formation on your feet. No further intervention is required at this time.  -OXYCODONE-INDUCED PRURITUS: Pruritus means itching. Your itching, particularly in your hands, is likely caused by your medication, Oxycodone. We have prescribed hydrocortisone cream for you to use for relief from the itching.  -MOBILITY ISSUES: You are experiencing fatigue and occasional 'fuzzy-headedness,' which makes it difficult for you to move around, especially in large spaces like shopping centers. We will refer you to physical therapy for an evaluation for a power wheelchair.  -GENERAL HEALTH MAINTENANCE: We will deactivate your MyChart account and switch to phone call communication based on your preference and difficulty with technology. Ensure that your follow-up with oncology is scheduled.  INSTRUCTIONS:  Please continue with the dietary modifications as advised by your dietician and keep taking Pepcid for esophagitis. Use the hydrocortisone cream as prescribed for itching. We will refer you to physical therapy for an evaluation  for a power wheelchair. Make sure to follow up with oncology as scheduled.

## 2023-08-17 ENCOUNTER — Encounter: Payer: 59 | Admitting: Family Medicine

## 2023-08-17 IMAGING — DX DG HAND COMPLETE 3+V*L*
3 series · 3 of 3 positions shown · non-contrast
Comparison: None.

CLINICAL DATA: Fall.

EXAM:
LEFT HAND - COMPLETE 3+ VIEW

[hand pa]
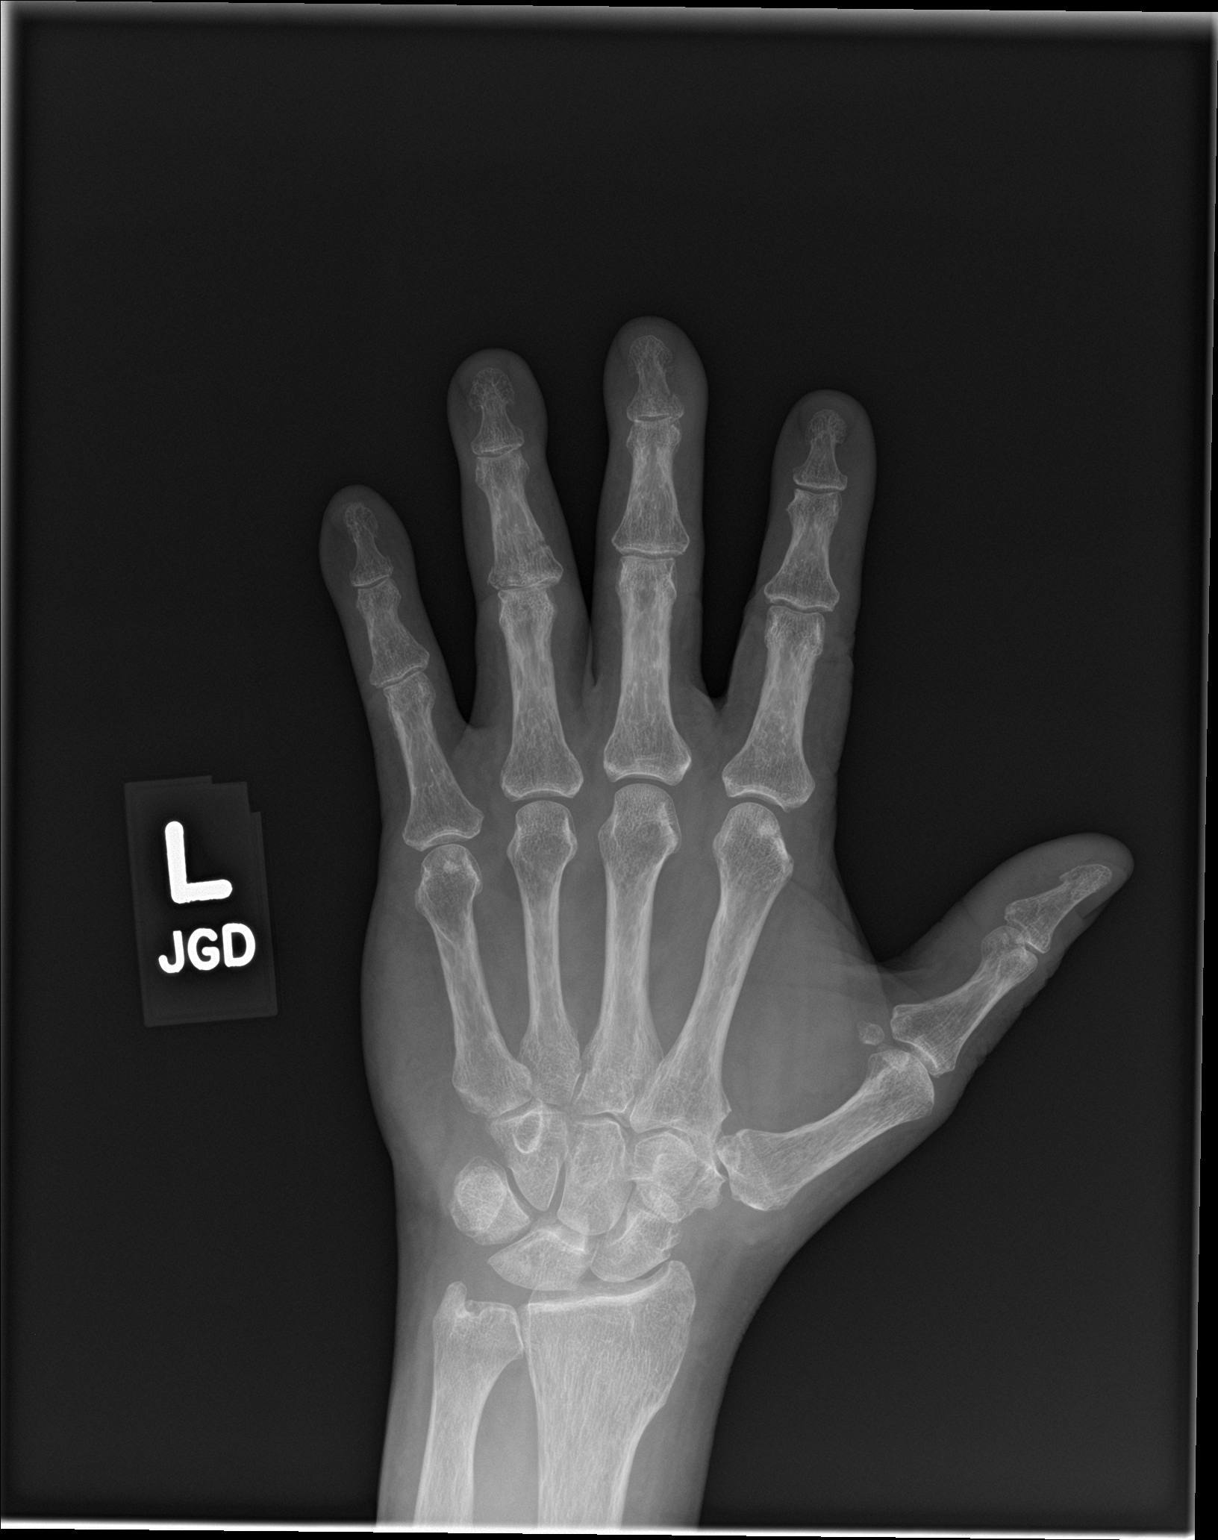

[hand obl]
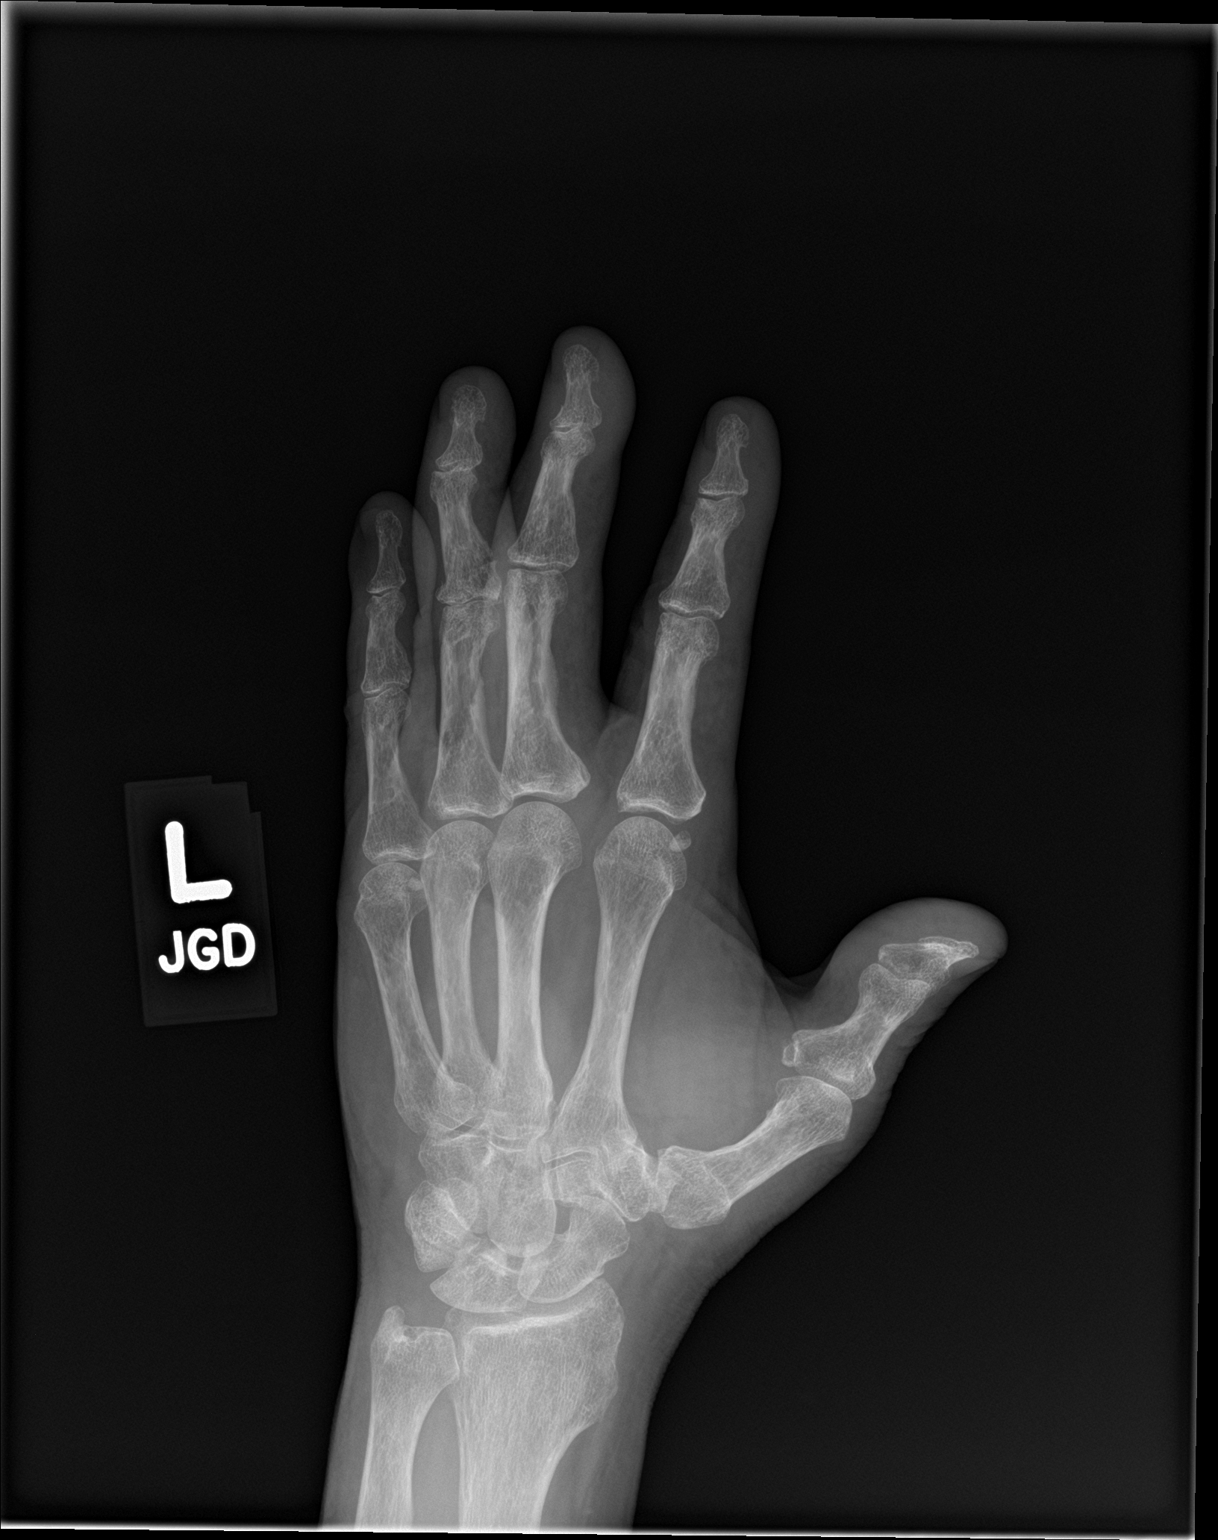

[hand lat]
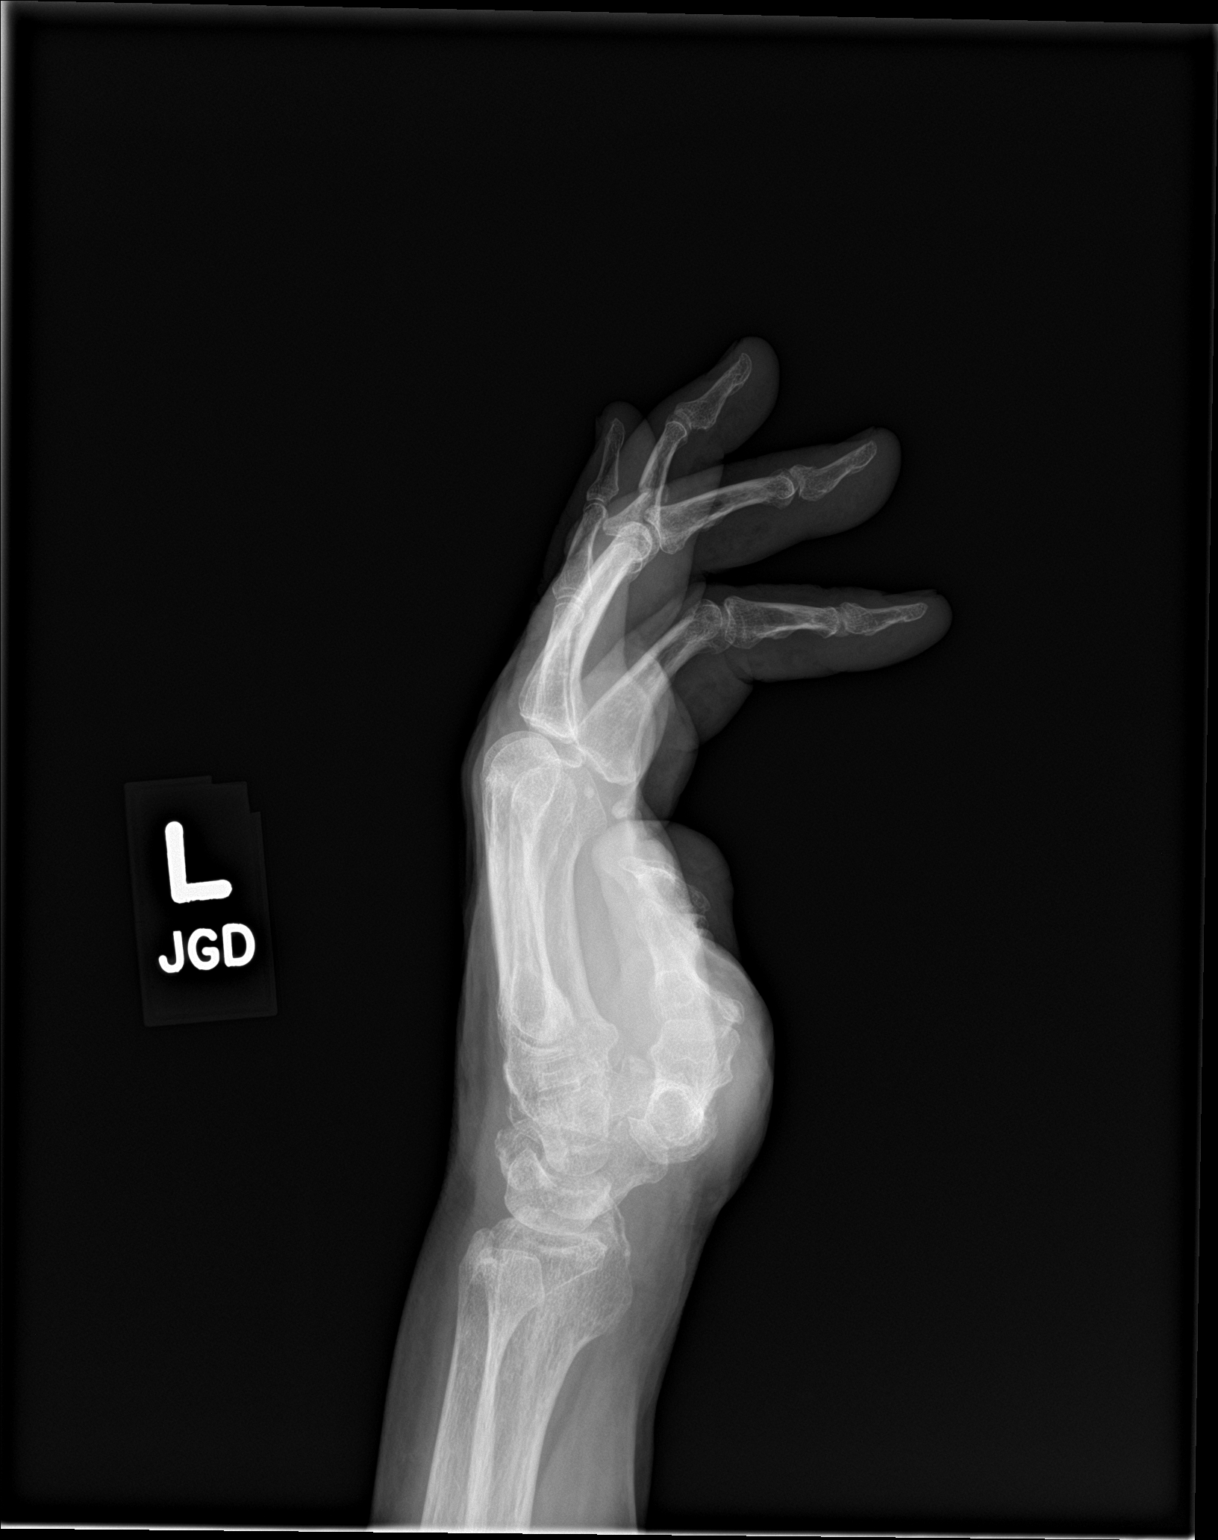

[3 of 3 positions shown; findings below may reference images not displayed]

FINDINGS: The bones are osteopenic. There is an acute transverse fracture
through the proximal aspect of the fourth middle phalanx. This is
mildly impacted. There is no dislocation. There is surrounding soft
tissue swelling.

There is an acute comminuted nondisplaced fracture through the base
of the third distal phalanx, nondisplaced. There is no significant
surrounding soft tissue abnormality.
IMPRESSION: 1. Acute fracture proximal aspect of the fourth middle phalanx.
2. Acute fracture base of the third distal phalanx.

## 2023-08-21 ENCOUNTER — Inpatient Hospital Stay: Payer: 59

## 2023-08-21 NOTE — Progress Notes (Signed)
Nutrition Follow-up:  Patient with esophageal cancer.  Followed by Dr Mosetta Putt and Mitzi Hansen.  Patient has completed treatment.  Not a surgical candidate.  Spoke with patient via phone.  Reports that he has tried cream of wheat orally mixed with milk and butter and tolerated well. Felt full after eating but gave tube feeding about 30 minutes before eating. Drinking water and carbonated beverage by mouth and tolerating well.  Taking 5 cartons of osmolite 1.5 daily via feeding tube and prosource    Medications: reviewed  Labs: reviewed  Anthropometrics:   Weight today 165 lb per home scale  169 lb on 1/18 173 lb on 12/30 170 lb on 11/25 179 lb on 11/19   Estimated Energy Needs  Kcals: 2100-2300 Protein: 110-125 g Fluid: > 2.2 L    INTERVENTION:  Patient to continue to weigh weekly and if weight staying the same or increasing decrease carton of tube feeding. Encouraged patient to try breakfast foods first before giving any tube feeding (soft scrambled eggs, cream of wheat, drinkable yogurts, etc.  Does not like sweetness of ensure/boost shakes.    MONITORING, EVALUATION, GOAL: weight trends, intake   NEXT VISIT: Monday, Feb 24 phone call  Mansour Balboa B. Freida Busman, RD, LDN Registered Dietitian [phone]

## 2023-08-24 ENCOUNTER — Telehealth: Payer: Self-pay | Admitting: Hematology

## 2023-08-24 NOTE — Telephone Encounter (Signed)
Patient is aware of scheduled appointments times/dates

## 2023-08-28 ENCOUNTER — Other Ambulatory Visit: Payer: Self-pay

## 2023-08-28 ENCOUNTER — Inpatient Hospital Stay: Payer: 59

## 2023-08-28 NOTE — Progress Notes (Signed)
 Nutrition Follow-up:  Patient with esophageal cancer.  Followed by Dr Mosetta Putt and Mitzi Hansen.  Patient has completed treatment.  Not a surgical candidate  Met with patient in person.  Patient did not remember that visit was by phone today so showed up in person for visit.  Patient reports that he is giving 4 cartons of osmolite 1.5 via feeding tube and continues prosource.  Patient has been eating more orally, drinkable yogurt, egg salad, cream of wheat, soups.  Continues to monitor weight    Medications: reviewed  Labs: reviewed  Anthropometrics:   Weight 166 lb on home scale  169 lb 1/18 173 lb on 12/30 170 lb on 11/25 179 lb on 11/19   Estimated Energy Needs  Kcals: 2100-2300 Protein: 110-125 g Fluid: > 2.2 L   INTERVENTION:  Patient to continue to weight weekly and reduce tube feeding as able Discussed high calorie, high protein foods to try this coming week (milkshakes, chicken salad, tuna salad, whole milk in soups,  mashed potatoes.      MONITORING, EVALUATION, GOAL: weight trends, intake   NEXT VISIT: Monday, March 10 after MD visit.  AVS printed for patient  Felisha Claytor B. Freida Busman, RD, LDN Registered Dietitian [phone]

## 2023-09-10 NOTE — Assessment & Plan Note (Signed)
-  adenocarcinoma, cTxN0M0, MMR normal, HER2 (-) -Patient presented to hospital with progressive dysphagia and weight loss. -EGD showed a malignant appearing severe stenosis 30 to 34 cm from incisors.  The stenosis measured 4 cm, the scope was not able to pass.  Biopsy confirmed adenocarcinoma -He underwent PEG feeding tube for nutrition -PET scan showed hypermetabolic lesion in the distal esophagus, no nodal or distant metastasis.   -pt was evaluated by cardiothoracic surgeon Dr. Cliffton Asters, he is not a good candidate for surgery, but will re-evaluate him after chemoradiation -He started concurrent chemoradiation with weekly carbo platinum and Taxol on 05/22/23, he completed on 07/03/2023 -He was hospitalized for cellulitis in 07/2022 and his overall health has further declined

## 2023-09-11 ENCOUNTER — Inpatient Hospital Stay: Payer: 59

## 2023-09-11 ENCOUNTER — Inpatient Hospital Stay

## 2023-09-11 ENCOUNTER — Encounter: Payer: Self-pay | Admitting: Hematology

## 2023-09-11 ENCOUNTER — Other Ambulatory Visit: Payer: Self-pay

## 2023-09-11 ENCOUNTER — Inpatient Hospital Stay: Payer: 59 | Attending: Hematology | Admitting: Hematology

## 2023-09-11 ENCOUNTER — Inpatient Hospital Stay: Admitting: Hematology

## 2023-09-11 VITALS — BP 110/60 | HR 70 | Temp 97.8°F | Resp 17 | Wt 161.0 lb

## 2023-09-11 DIAGNOSIS — D696 Thrombocytopenia, unspecified: Secondary | ICD-10-CM

## 2023-09-11 DIAGNOSIS — C155 Malignant neoplasm of lower third of esophagus: Secondary | ICD-10-CM | POA: Diagnosis not present

## 2023-09-11 DIAGNOSIS — R5383 Other fatigue: Secondary | ICD-10-CM | POA: Diagnosis not present

## 2023-09-11 DIAGNOSIS — Z79899 Other long term (current) drug therapy: Secondary | ICD-10-CM | POA: Diagnosis not present

## 2023-09-11 DIAGNOSIS — K921 Melena: Secondary | ICD-10-CM | POA: Insufficient documentation

## 2023-09-11 DIAGNOSIS — R42 Dizziness and giddiness: Secondary | ICD-10-CM | POA: Insufficient documentation

## 2023-09-11 DIAGNOSIS — K219 Gastro-esophageal reflux disease without esophagitis: Secondary | ICD-10-CM | POA: Diagnosis not present

## 2023-09-11 DIAGNOSIS — C159 Malignant neoplasm of esophagus, unspecified: Secondary | ICD-10-CM

## 2023-09-11 DIAGNOSIS — Z7952 Long term (current) use of systemic steroids: Secondary | ICD-10-CM | POA: Insufficient documentation

## 2023-09-11 LAB — CMP (CANCER CENTER ONLY)
ALT: 16 U/L (ref 0–44)
AST: 30 U/L (ref 15–41)
Albumin: 3.4 g/dL — ABNORMAL LOW (ref 3.5–5.0)
Alkaline Phosphatase: 102 U/L (ref 38–126)
Anion gap: 10 (ref 5–15)
BUN: 33 mg/dL — ABNORMAL HIGH (ref 8–23)
CO2: 21 mmol/L — ABNORMAL LOW (ref 22–32)
Calcium: 8.6 mg/dL — ABNORMAL LOW (ref 8.9–10.3)
Chloride: 105 mmol/L (ref 98–111)
Creatinine: 0.96 mg/dL (ref 0.61–1.24)
GFR, Estimated: >60 mL/min (ref >60)
Glucose, Bld: 184 mg/dL — ABNORMAL HIGH (ref 70–99)
Potassium: 4.3 mmol/L (ref 3.5–5.1)
Sodium: 136 mmol/L (ref 135–145)
Total Bilirubin: 0.8 mg/dL (ref 0.0–1.2)
Total Protein: 5.9 g/dL — ABNORMAL LOW (ref 6.5–8.1)

## 2023-09-11 LAB — CBC WITH DIFFERENTIAL (CANCER CENTER ONLY)
Abs Immature Granulocytes: 0.63 10*3/uL — ABNORMAL HIGH (ref 0.00–0.07)
Basophils Absolute: 0.2 10*3/uL — ABNORMAL HIGH (ref 0.0–0.1)
Basophils Relative: 1 %
Eosinophils Absolute: 0.2 10*3/uL (ref 0.0–0.5)
Eosinophils Relative: 2 %
HCT: 42.3 % (ref 39.0–52.0)
Hemoglobin: 14.3 g/dL (ref 13.0–17.0)
Immature Granulocytes: 4 %
Lymphocytes Relative: 4 %
Lymphs Abs: 0.5 10*3/uL — ABNORMAL LOW (ref 0.7–4.0)
MCH: 31.4 pg (ref 26.0–34.0)
MCHC: 33.8 g/dL (ref 30.0–36.0)
MCV: 93 fL (ref 80.0–100.0)
Monocytes Absolute: 1.8 10*3/uL — ABNORMAL HIGH (ref 0.1–1.0)
Monocytes Relative: 13 %
Neutro Abs: 11.3 10*3/uL — ABNORMAL HIGH (ref 1.7–7.7)
Neutrophils Relative %: 76 %
Platelet Count: 24 10*3/uL — ABNORMAL LOW (ref 150–400)
RBC: 4.55 MIL/uL (ref 4.22–5.81)
RDW: 14.3 % (ref 11.5–15.5)
WBC Count: 14.6 10*3/uL — ABNORMAL HIGH (ref 4.0–10.5)
nRBC: 0 % (ref 0.0–0.2)

## 2023-09-11 LAB — DIC (DISSEMINATED INTRAVASCULAR COAGULATION)PANEL
D-Dimer, Quant: >20 ug{FEU}/mL — ABNORMAL HIGH (ref 0.00–0.50)
Fibrinogen: 89 mg/dL — CL (ref 210–475)
INR: 2.2 — ABNORMAL HIGH (ref 0.8–1.2)
Platelets: 26 10*3/uL — ABNORMAL LOW (ref 150–400)
Prothrombin Time: 24.5 s — ABNORMAL HIGH (ref 11.4–15.2)
Smear Review: NONE SEEN
aPTT: 36 s (ref 24–36)

## 2023-09-11 LAB — VITAMIN B12: Vitamin B-12: 380 pg/mL (ref 180–914)

## 2023-09-11 LAB — FOLATE: Folate: 6.6 ng/mL (ref >5.9)

## 2023-09-11 LAB — IMMATURE PLATELET FRACTION: Immature Platelet Fraction: 20.4 % — ABNORMAL HIGH (ref 1.2–8.6)

## 2023-09-11 MED ORDER — PANTOPRAZOLE SODIUM 40 MG PO PACK: 40.0000 mg | PACK | Freq: Every day | ORAL | 0 refills | Status: DC

## 2023-09-11 MED ORDER — DEXAMETHASONE 4 MG PO TABS: 4.0000 mg | ORAL_TABLET | Freq: Two times a day (BID) | ORAL | 0 refills | Status: DC

## 2023-09-11 NOTE — Progress Notes (Signed)
 [facility]   Telephone:([phone]) [phone] [phone]   Clinic Follow up Note   Patient Care Team: [name], MD as PCP - General (Family Medicine) Samuel Fus, MD as PCP - Cardiology (Cardiology) Samuel Mood, MD as Consulting Physician (Hematology and Oncology)  Date of Service:  09/11/2023  CHIEF COMPLAINT: f/u of esophageal cancer  CURRENT THERAPY:  Observation  Oncology History   Malignant neoplasm of esophagus (HCC) -adenocarcinoma, cTxN0M0, MMR normal, HER2 (-) -Patient presented to hospital with progressive dysphagia and weight loss. -EGD showed a malignant appearing severe stenosis 30 to 34 cm from incisors.  The stenosis measured 4 cm, the scope was not able to pass.  Biopsy confirmed adenocarcinoma -He underwent PEG feeding tube for nutrition -PET scan showed hypermetabolic lesion in the distal esophagus, no nodal or distant metastasis.   -pt was evaluated by cardiothoracic surgeon Dr. Cliffton Asters, he is not a good candidate for surgery, but will re-evaluate him after chemoradiation -He started concurrent chemoradiation with weekly carbo platinum and Taxol on 05/22/23, he completed on 07/03/2023 -He was hospitalized for cellulitis in 07/2022 and his overall health has further declined    Assessment and Plan    Esophageal Cancer Post-chemotherapy and radiation follow-up. Reports some residual dysphagia, hoarseness, and fatigue. January PET scan showed no note or distant  metastasis, but esophageal activity likely due to inflammation or radiation effects, although residual cancer is not ruled out.  -I discussed option of EGD evaluation to see if he still has residual disease.if yes, will consider immunotherapy due to frailty and preference to avoid further chemotherapy. Discussed nivolumab and the need for PD-L1 testing to predict benefit. - Order PET scan in one month -will reach out to GI to consider repeating upper endoscopy - Perform PD-L1  testing - Refer to palliative care for home support - Coordinate with GI for endoscopy  Thrombocytopenia Platelet count critically low at 24K today. It has been gradually dropping in the past months.  Possible causes include chemotherapy, ITP, or cancer-related bone marrow involvement. Reports melena, indicating potential GI bleeding. Discussed stopping Eliquis due to low platelet count and potential need for bone marrow biopsy if no response to steroids. Explained severe bleeding risks and importance of monitoring. - Stop Eliquis - Start dexamethasone 4 mg, 10 tablets daily for 4 days - Order blood test on Wednesday to assess response to steroids - Administer Enplate injection on Wednesday - Consider bone marrow biopsy if no response to steroids - Monitor for severe bleeding and advise ER visit if necessary - Coordinate with GI for potential endoscopy after platelet count improves  Gastroesophageal Reflux Disease (GERD) Concern about Pepcid potentially causing tumors. Reports melena, possibly related to GI bleeding. Discussed alternative GERD medications. - Stop Pepcid - Start Protonix (pantoprazole) powder for suspension  Functional Decline Experiencing fatigue, dizziness, and mobility issues. Requires assistance with daily activities and home navigation. Discussed need for toilet seat extender and electronic wheelchair. Referred to social worker for community resources and home modifications. Evaluated need for periodic nurse visits for home medical care. - Prescribe toilet seat extender - Recommend electronic wheelchair for mobility - Refer to Child psychotherapist for community resources and home modifications - Evaluate need for periodic nurse visits for home medical care  Plan -Due to severe thrombocytopenia, I called in dexamethasone 40 mg daily for 4 days -Will obtain additional lab including DIC panel and immature platelet count -Will repeat a CBC in 3 days, and to start Nplate if gets  insurance  approval -Will contact the homecare about equipments he needs at home, especially toilet seat extender -Refer him to social worker to discuss if there is any social benefit he can use for home care -What test he is previously tumor for PD-L1 expression -Will reach out to his GI to see if we can repeat the EGD, when his thrombocytopenia improves -Follow-up in 2 days for close follow-up, and treatment for his thrombocytopenia       SUMMARY OF ONCOLOGIC HISTORY: Oncology History  Malignant neoplasm of esophagus (HCC)  04/18/2023 Cancer Staging   Staging form: Esophagus - Adenocarcinoma, AJCC 8th Edition - Clinical stage from 04/18/2023: Stage Unknown (cTX, cN0, cM0) - Signed by Samuel Mood, MD on 05/04/2023 Total positive nodes: 0   04/20/2023 Initial Diagnosis   Malignant neoplasm of lower third of esophagus (HCC)   05/22/2023 -  Chemotherapy   Patient is on Treatment Plan : ESOPHAGUS Carboplatin + Paclitaxel Weekly X 6 Weeks with XRT        Discussed the use of AI scribe software for clinical note transcription with the patient, who gave verbal consent to proceed.  History of Present Illness   A 78 year old patient with a history of esophageal cancer presents for follow-up after undergoing chemotherapy and radiation therapy. The patient reports that swallowing has improved since the treatment, but still experiences difficulty with solid foods. The patient has been relying on a feeding tube for nutrition, consuming six boxes of OmniLite per day, and has been gradually introducing solid foods into his diet. The patient also reports black stools for the past week, which occurs once a day. The patient has been experiencing increasing fatigue and dizziness, and has been prescribed an electronic wheelchair to assist with mobility. The patient is cared for at home by his spouse and a caregiver. The patient expresses interest in exploring immunotherapy as a potential treatment option.          All other systems were reviewed with the patient and are negative.  MEDICAL HISTORY:  Past Medical History:  Diagnosis Date   Diabetes mellitus without complication (HCC)    Esophageal cancer (HCC)    Pulmonary embolism (HCC)    In October 2024    SURGICAL HISTORY: Past Surgical History:  Procedure Laterality Date   BIOPSY  04/18/2023   Procedure: BIOPSY;  Surgeon: Lynann Bologna, DO;  Location: [facility] ENDOSCOPY;  Service: Gastroenterology;;   ESOPHAGOGASTRODUODENOSCOPY N/A 04/18/2023   Procedure: ESOPHAGOGASTRODUODENOSCOPY (EGD);  Surgeon: Lynann Bologna, DO;  Location: [facility] ENDOSCOPY;  Service: Gastroenterology;  Laterality: N/A;   IR GASTROSTOMY TUBE MOD SED  04/20/2023   IR NASO G TUBE PLC W/FL W/RAD  04/20/2023   KNEE ARTHROPLASTY Bilateral     I have reviewed the social history and family history with the patient and they are unchanged from previous note.  ALLERGIES:  is allergic to penicillins, sulfa antibiotics, acetaminophen, and procaine.  MEDICATIONS:  Current Outpatient Medications  Medication Sig Dispense Refill   acetaminophen (TYLENOL) 160 MG/5ML solution Place 20.3 mLs (650 mg total) into feeding tube every 6 (six) hours as needed for mild pain (pain score 1-3), headache or fever.     apixaban (ELIQUIS) 5 MG TABS tablet Place 1 tablet (5 mg total) into feeding tube 2 (two) times daily. 60 tablet 1   dexamethasone (DECADRON) 4 MG tablet Take 1 tablet (4 mg total) by mouth 2 (two) times daily with a meal. 40 tablet 0   hydrocortisone cream 0.5 % Apply 1 Application  topically 2 (two) times daily. 56 g 1   lactulose (CHRONULAC) 10 GM/15ML solution Place 30 mLs (20 g total) into feeding tube 2 (two) times daily as needed for moderate constipation. 236 mL 0   Nutritional Supplements (OSMOLITE 1.5 CAL PO) Place 474 mLs into feeding tube 3 (three) times daily. Give via feeding tube towards the end of tube feeding; flush with (60ml) sterile water; then give  1 packet of Prosource TF protein packet and flush with (60ml) sterile water. IF needed flush with additional (30ml) sterile water.     Nutritional Supplements (PROSOURCE TF PO) Place 1 packet into feeding tube with breakfast, with lunch, and with evening meal. Use with the last portion of tube feeding; Give via feeding tube towards the end of tube feeding; flush with (60ml) sterile water; then give 1 packet of Prosource TF protein packet and flush with (60ml) sterile water. IF needed flush with additional (30ml) sterile water.     ondansetron (ZOFRAN) 4 MG tablet Take 1 tablet (4 mg total) by mouth every 8 (eight) hours as needed for nausea or vomiting. 30 tablet 2   oxyCODONE (OXY IR/ROXICODONE) 5 MG immediate release tablet Place 1 tablet (5 mg total) into feeding tube every 6 (six) hours as needed for moderate pain (pain score 4-6). 30 tablet 0   pantoprazole sodium (PROTONIX) 40 mg Place 40 mg into feeding tube daily. 30 packet 0   PRESCRIPTION MEDICATION Apply 1 application  topically as needed (appy to feet and ankles). Sonafine Wound Dressing     Water For Irrigation, Sterile (FREE WATER) SOLN Place 120 mLs into feeding tube 6 (six) times daily. 24000 mL 0   No current facility-administered medications for this visit.    PHYSICAL EXAMINATION: ECOG PERFORMANCE STATUS: 2 - Symptomatic, <50% confined to bed  Vitals:   09/11/23 1306  BP: 110/60  Pulse: 70  Resp: 17  Temp: 97.8 F (36.6 C)  SpO2: 96%   Wt Readings from Last 3 Encounters:  09/11/23 161 lb (73 kg)  07/22/23 169 lb 8.5 oz (76.9 kg)  07/11/23 172 lb (78 kg)     GENERAL:alert, no distress and comfortable SKIN: skin color, texture, turgor are normal, no rashes or significant lesions EYES: normal, Conjunctiva are pink and non-injected, sclera clear NECK: supple, thyroid normal size, non-tender, without nodularity LYMPH:  no palpable lymphadenopathy in the cervical, axillary  LUNGS: clear to auscultation and  percussion with normal breathing effort HEART: regular rate & rhythm and no murmurs and no lower extremity edema ABDOMEN:abdomen soft, non-tender and normal bowel sounds, (+) PEG tube in place  Musculoskeletal:no cyanosis of digits and no clubbing  NEURO: alert & oriented x 3 with fluent speech, no focal motor/sensory deficits       LABORATORY DATA:  I have reviewed the data as listed    Latest Ref Rng & Units 09/11/2023    2:24 PM 09/11/2023   12:28 PM 07/23/2023    4:55 AM  CBC  WBC 4.0 - 10.5 K/uL  14.6  7.3   Hemoglobin 13.0 - 17.0 g/dL  16.1  09.6   Hematocrit 39.0 - 52.0 %  42.3  33.0   Platelets 150 - 400 K/uL 26  24  55         Latest Ref Rng & Units 09/11/2023   12:28 PM 07/21/2023    6:34 AM 07/20/2023    5:36 AM  CMP  Glucose 70 - 99 mg/dL 045  409  811  BUN 8 - 23 mg/dL 33  21  20   Creatinine 0.61 - 1.24 mg/dL 1.61  0.96  0.45   Sodium 135 - 145 mmol/L 136  139  134   Potassium 3.5 - 5.1 mmol/L 4.3  4.7  4.0   Chloride 98 - 111 mmol/L 105  107  105   CO2 22 - 32 mmol/L 21  26  22    Calcium 8.9 - 10.3 mg/dL 8.6  8.0  7.8   Total Protein 6.5 - 8.1 g/dL 5.9  4.6    Total Bilirubin 0.0 - 1.2 mg/dL 0.8  0.8    Alkaline Phos 38 - 126 U/L 102  76    AST 15 - 41 U/L 30  24    ALT 0 - 44 U/L 16  15        RADIOGRAPHIC STUDIES: I have personally reviewed the radiological images as listed and agreed with the findings in the report. No results found.    Orders Placed This Encounter  Procedures   DIC (disseminated intravasc coag) panel    Standing Status:   Future    Number of Occurrences:   1    Expected Date:   09/11/2023    Expiration Date:   09/10/2024   Immature Platelet Fraction    Standing Status:   Future    Number of Occurrences:   1    Expected Date:   09/11/2023    Expiration Date:   09/10/2024   Vitamin B12    Standing Status:   Future    Number of Occurrences:   1    Expected Date:   09/11/2023    Expiration Date:   09/10/2024   Folate, Serum     Standing Status:   Future    Number of Occurrences:   1    Expected Date:   09/11/2023    Expiration Date:   09/10/2024   Methylmalonic acid, serum    Standing Status:   Future    Number of Occurrences:   1    Expected Date:   09/11/2023    Expiration Date:   09/10/2024   All questions were answered. The patient knows to call the clinic with any problems, questions or concerns. No barriers to learning was detected. The total time spent in the appointment was 60 minutes.     Samuel Mood, MD 09/11/2023

## 2023-09-11 NOTE — Progress Notes (Unsigned)
 Critical Fibrinogen called and read back to Janett Billow, RN at 1528 by Para Skeans (MT) 09/11/2023

## 2023-09-11 NOTE — Progress Notes (Signed)
 Nutrition  Nutrition visit cancelled for today as patient needing to do more labs for Dr Mosetta Putt.  Patient will reschedule if needed in the future.    RD available as needed  Taiden Raybourn B. Freida Busman, RD, LDN Registered Dietitian [phone]

## 2023-09-11 NOTE — Progress Notes (Signed)
 Critical Lab Value Reported: Fibrinogen 89 and D-miner >20.00   Dr. Mosetta Putt notified of critical labs

## 2023-09-11 NOTE — Progress Notes (Signed)
 Per verbal order from Dr. Mosetta Putt w/readback for referral to [facility] for palliative Care and to add a PDL-1 to case# [id number].  Referral order for Palliative Care placed and Secure Chat message sent to Thea Gist, RN w/[facility].  Email to [employer] in [facility] sent to add a PDL-1 to case#[id number].

## 2023-09-12 ENCOUNTER — Ambulatory Visit: Payer: 59 | Admitting: Physical Therapy

## 2023-09-12 ENCOUNTER — Inpatient Hospital Stay: Admitting: Licensed Clinical Social Worker

## 2023-09-12 DIAGNOSIS — D696 Thrombocytopenia, unspecified: Secondary | ICD-10-CM | POA: Diagnosis not present

## 2023-09-12 DIAGNOSIS — M6281 Muscle weakness (generalized): Secondary | ICD-10-CM | POA: Insufficient documentation

## 2023-09-12 DIAGNOSIS — C155 Malignant neoplasm of lower third of esophagus: Secondary | ICD-10-CM | POA: Diagnosis not present

## 2023-09-12 DIAGNOSIS — E119 Type 2 diabetes mellitus without complications: Secondary | ICD-10-CM | POA: Diagnosis not present

## 2023-09-12 DIAGNOSIS — K922 Gastrointestinal hemorrhage, unspecified: Secondary | ICD-10-CM | POA: Diagnosis not present

## 2023-09-12 DIAGNOSIS — I472 Ventricular tachycardia, unspecified: Secondary | ICD-10-CM | POA: Diagnosis not present

## 2023-09-12 DIAGNOSIS — R531 Weakness: Secondary | ICD-10-CM | POA: Insufficient documentation

## 2023-09-12 DIAGNOSIS — K920 Hematemesis: Secondary | ICD-10-CM | POA: Diagnosis not present

## 2023-09-12 DIAGNOSIS — I5022 Chronic systolic (congestive) heart failure: Secondary | ICD-10-CM | POA: Diagnosis not present

## 2023-09-12 DIAGNOSIS — D72829 Elevated white blood cell count, unspecified: Secondary | ICD-10-CM | POA: Diagnosis not present

## 2023-09-12 DIAGNOSIS — L89152 Pressure ulcer of sacral region, stage 2: Secondary | ICD-10-CM | POA: Diagnosis not present

## 2023-09-12 DIAGNOSIS — I700 Atherosclerosis of aorta: Secondary | ICD-10-CM | POA: Diagnosis not present

## 2023-09-12 DIAGNOSIS — R791 Abnormal coagulation profile: Secondary | ICD-10-CM | POA: Diagnosis not present

## 2023-09-12 DIAGNOSIS — Z923 Personal history of irradiation: Secondary | ICD-10-CM | POA: Diagnosis not present

## 2023-09-12 DIAGNOSIS — Z7409 Other reduced mobility: Secondary | ICD-10-CM | POA: Insufficient documentation

## 2023-09-12 DIAGNOSIS — Z66 Do not resuscitate: Secondary | ICD-10-CM | POA: Diagnosis not present

## 2023-09-12 DIAGNOSIS — I4510 Unspecified right bundle-branch block: Secondary | ICD-10-CM | POA: Diagnosis not present

## 2023-09-12 DIAGNOSIS — T402X5A Adverse effect of other opioids, initial encounter: Secondary | ICD-10-CM | POA: Diagnosis not present

## 2023-09-12 DIAGNOSIS — D693 Immune thrombocytopenic purpura: Secondary | ICD-10-CM | POA: Diagnosis not present

## 2023-09-12 DIAGNOSIS — Z931 Gastrostomy status: Secondary | ICD-10-CM | POA: Diagnosis not present

## 2023-09-12 DIAGNOSIS — R2689 Other abnormalities of gait and mobility: Secondary | ICD-10-CM | POA: Insufficient documentation

## 2023-09-12 DIAGNOSIS — D62 Acute posthemorrhagic anemia: Secondary | ICD-10-CM | POA: Diagnosis not present

## 2023-09-12 DIAGNOSIS — K921 Melena: Secondary | ICD-10-CM | POA: Diagnosis not present

## 2023-09-12 DIAGNOSIS — Z9221 Personal history of antineoplastic chemotherapy: Secondary | ICD-10-CM | POA: Diagnosis not present

## 2023-09-12 DIAGNOSIS — E43 Unspecified severe protein-calorie malnutrition: Secondary | ICD-10-CM | POA: Diagnosis not present

## 2023-09-12 DIAGNOSIS — Z8501 Personal history of malignant neoplasm of esophagus: Secondary | ICD-10-CM | POA: Diagnosis not present

## 2023-09-12 DIAGNOSIS — D689 Coagulation defect, unspecified: Secondary | ICD-10-CM | POA: Diagnosis not present

## 2023-09-12 DIAGNOSIS — D65 Disseminated intravascular coagulation [defibrination syndrome]: Secondary | ICD-10-CM | POA: Diagnosis not present

## 2023-09-12 DIAGNOSIS — Z515 Encounter for palliative care: Secondary | ICD-10-CM | POA: Diagnosis not present

## 2023-09-12 DIAGNOSIS — Z86711 Personal history of pulmonary embolism: Secondary | ICD-10-CM | POA: Diagnosis not present

## 2023-09-12 NOTE — Therapy (Unsigned)
 OUTPATIENT PHYSICAL THERAPY WHEELCHAIR EVALUATION   Patient Name: Samuel [name] MRN: [id number] DOB:12-13-1945, 78 y.o., male Today's Date: 09/13/2023  END OF SESSION:  PT End of Session - 09/13/23 0918     Visit Number 1    Number of Visits 1    Authorization Type [employer]    PT Start Time 1140   pt arrived 35" late for appt due to getting lost   PT Stop Time 1245    PT Time Calculation (min) 65 min    Equipment Utilized During Treatment Gait belt    Activity Tolerance Patient tolerated treatment well    Behavior During Therapy WFL for tasks assessed/performed             Past Medical History:  Diagnosis Date   Diabetes mellitus without complication (HCC)    Esophageal cancer (HCC)    Pulmonary embolism (HCC)    In October 2024   Past Surgical History:  Procedure Laterality Date   BIOPSY  04/18/2023   Procedure: BIOPSY;  Surgeon: Lynann Bologna, DO;  Location: [facility] ENDOSCOPY;  Service: Gastroenterology;;   ESOPHAGOGASTRODUODENOSCOPY N/A 04/18/2023   Procedure: ESOPHAGOGASTRODUODENOSCOPY (EGD);  Surgeon: Lynann Bologna, DO;  Location: [facility] ENDOSCOPY;  Service: Gastroenterology;  Laterality: N/A;   IR GASTROSTOMY TUBE MOD SED  04/20/2023   IR NASO G TUBE PLC W/FL W/RAD  04/20/2023   KNEE ARTHROPLASTY Bilateral    Patient Active Problem List   Diagnosis Date Noted   Thrombocytopenia (HCC) 09/12/2023   Cellulitis 07/19/2023   Impacted cerumen of both ears 07/12/2023   Conductive hearing loss, bilateral 07/12/2023   Port-A-Cath in place 07/03/2023   Acute prerenal azotemia 05/23/2023   Chronic diastolic CHF (congestive heart failure) (HCC) 05/23/2023   History of pulmonary embolism 05/23/2023   Generalized weakness 05/22/2023   Protein-calorie malnutrition, severe 04/22/2023   Malignant neoplasm of esophagus (HCC) 04/20/2023   AV block, Mobitz 2 04/19/2023   Bradycardia 04/18/2023   Dysphagia 04/14/2023   Dehydration 04/14/2023   LFT elevation  04/14/2023   Acute pulmonary embolism (HCC) 04/14/2023   Swelling of clavicular region 04/14/2023   Pulmonary embolism (HCC) 04/14/2023   Troponin I above reference range 04/14/2023   DM2 (diabetes mellitus, type 2) (HCC) 12/19/2022   Primary hypertension 02/01/2022   Preventative health care 06/15/2015   Psoriasis 06/15/2015    PCP: [name], MD  REFERRING PROVIDER: [name], MD  REFERRING DIAG: C15.5 (ICD-10-CM) - Malignant neoplasm of lower third of esophagus (HCC) R53.1 (ICD-10-CM) - Generalized weakness Z74.09 (ICD-10-CM) - Impaired functional mobility, balance, gait, and endurance  THERAPY DIAG:  Other abnormalities of gait and mobility  Muscle weakness (generalized)  ONSET DATE: Oct. 2024  Rationale for Evaluation and Treatment: Habilitation  SUBJECTIVE:  SUBJECTIVE STATEMENT: Pt presents for PT eval - needs assist with car transfer and needs to use clinic's wheelchair due to pt not having his own.  Pt was transferred from car (passenger seat) to w/c with mod assist.  Pt has previously spoken with vendor, Saddie Benders with [employer], about his preferred wheelchair - a power w/c that can be broken down to be easily transported  PERTINENT HISTORY:  See above PAIN:  Are you having pain? No  PRECAUTIONS: Fall; pt has PEG tube and port-a-cath in place  WEIGHT BEARING RESTRICTIONS: No  FALLS:  Has patient fallen in last 6 months? No   OCCUPATION: retired  PLOF: Independent prior to Oct. 2024  PATIENT GOALS: obtain power wheelchair for independence with household mobility   PATIENT INFORMATION: This Evaluation form will serve as the LMN for the following suppliers:  Supplier:  [employer] Contact Person:  Joseph Pierini Phone:  [phone]   Reason for  Referral: Patient/caregiver Goals: Patient was seen for face-to-face evaluation for new power wheelchair.  Also present was [employer], [employer] to discuss recommendations and wheelchair options.  Further paperwork was completed and sent to vendor.  Patient appears to qualify for power mobility device at this time per objective findings.   MEDICAL HISTORY: Diagnosis:  C15.5 (ICD-10-CM) - Malignant neoplasm of lower third of esophagus (HCC) R53.1 (ICD-10-CM) - Generalized weakness Z74.09 (ICD-10-CM) - Impaired functional mobility, balance, gait, and endurance Primary Diagnosis Onset:  Oct. 2024 [] Progressive Disease Relevant Past and Future Surgeries: Chemo & radiation treatments recently completed:  PEG tube Oct. 2024 Height: 5'6" Weight:  116# Explain and recent changes or trends in weight:   Relevant History including falls:  Pt reports no falls in past 6 months;   uses a RW with assistance for household ambulation in home.  Pt has recently completed radiation & chemo treatments for esophageal cancer - continues to have effects of extreme weakness and fatigue secondary to these treatments.  Pt reports Lt shoulder fracture (Oct. 2023?) due to a fall (pt is a poor historian). Per MD Chart notes:  "pt is a 78 yr old male with history of bilateral knee arthroscopic surgery, fracture of distal phalanx of the middle finger of the left hand in 10/2021, type 2 diabetes mellitus (A1c 6.4), melanoma, esophageal cancer (on chemo and radiation), status post PEG tube, PE (on anticoagulation with Eliquis)"; hospitalized 1-14 - 07-23-23 with cellulitis LLE    HOME ENVIRONMENT: [x] House  [] Condo/town home  [] Apartment  [] Assisted Living    [] Lives Alone [x]  Lives with Others                                                    Hours with caregiver:  12+   [x] Home is accessible to patient            Stairs  [x] Yes []  No     Ramp [x] Yes [] No Comments:  pt now lives downstairs - does not access 2nd level   COMMUNITY  ADL: TRANSPORTATION: [x] Car    [] Van    [] Public Transportation    [] Adapted w/c Lift   [] Ambulance   [] Other:       [] Sits in wheelchair during transport  Employment/School:     Specific requirements pertaining to mobility  Other:                                       FUNCTIONAL/SENSORY PROCESSING SKILLS:  Handedness:   [x] Right     [] Left    [] NA  Comments:                                 Functional Processing Skills for Wheeled Mobility [x] Processing Skills are adequate for safe wheelchair operation  Areas of concern than may interfere with safe operation of wheelchair Description of problem   []  Attention to environment     [] Judgment     []  Hearing  []  Vision or visual processing    [] Motor Planning  []  Fluctuations in Behavior                                                   VERBAL COMMUNICATION: [x] WFL receptive [x]  WFL expressive [] Understandable  [] Difficult to understand  [] non-communicative []  Uses an augmented communication device    CURRENT SEATING / MOBILITY: Current Mobility Base:   [x] None  [] Dependent  [] Manual  [] Scooter  [] Power   Type of Control:                       Manufacturer:                         Size:                         Age:                           Current Condition of Mobility Base:                                                                                                                     Current Wheelchair components:  Describe posture in present seating system:                                                                            SENSATION and SKIN ISSUES: Sensation [x] Intact [] Impaired [] Absent   Level of sensation:                           Pressure Relief: Able to perform effective pressure relief :   [x] Yes  []  No Method:                                                                               If not, Why?:                                                                          Skin Issues/Skin Integrity Current Skin Issues   [] Yes [x] No  [] Intact []  Red area []  Open Area  [x] Scar Tissue [x] At risk from prolonged sitting  Where  gluteal fold due to radiation burn from radiation treatment per pt report - could possibly be due to inadequate pressure relief due to prolonged sitting; hospitalized 1-14 - 07-23-23 with cellulits LLE                     History of Skin Issues   [x] Yes [] No  Where Gluteal fold                                      When  Oct. 2023 and again Jan. 2025                                              Hx of skin flap surgeries [] Yes [x] No  Where                  When                                                  Limited sitting tolerance [] Yes [] No Hours spent sitting in wheelchair daily N/A- pt reports he sits in a lift chair most of the day - does not currently have a wheelchair  Complaint of Pain:  Please describe:  in Lt shoulder and Lt arm at times "if I move the wrong way"                                                                                                           Swelling/Edema:  None                                                                                                                                              ADL STATUS (in reference to wheelchair use):  Indep Assist Unable Indep with Equip Not assessed Comments  Dressing                X                                      Needs assist with lower body dressing                 Eating             X          X                                   Currently has PEG tube due to dysphagia                                                                  Toileting                                          X  Bathing               X                                              Uses tub bench:   Plans to renovate tub to walk in shower                                                                      Grooming/ Hygiene                                       X                    Performs in seated position                                                                 Meal Prep                         X                              Only able to do light meal prep                                                                 IADLS                           X                                                                                       Bowel Management: [] Continent  [] Incontinent  [x] Accidents Comments:                                                  Bladder Management: [] Continent  [] Incontinent  [x] Accidents Comments:  WHEELCHAIR SKILLS: Manual w/c Propulsion: [] UE or LE strength and endurance sufficient to participate in ADLs using manual wheelchair Arm :  [] left [] right  [] Both                                   Foot:   [] left [] right  [] Both  Distance:   Operate Scooter: []  Strength, hand grip, balance and transfer appropriate for use [] Living environment is accessible for use of scooter  Operate Power w/c:  [x]  Std. Joystick   []  Alternative Controls Indep [x]  Assist []  Dependent/ Unable []  N/A []  [x] Safe          [x]  Functional      Distance: 100'             Bed confined without wheelchair [x]  Yes -   []  No   STRENGTH/RANGE OF MOTION:  Active Range of Motion Strength  Shoulder Rt shoulder flexion: 132 degrees:  Rt shoulder abdct. = 88 degrees:  Lt shoulder flexion = 48 degrees:  very minimal active Lt shoulder abduction                                                        Rt shoulder musc. 3+ - 4/5 in available range:  Lt shoulder musc. 2+/5                                                   Elbow      WFL's bil. UE's                                            4/5 bil. UE's                                                              Wrist/Hand   WFL's bil. UE's                                                                WFL's                                                                      Hip   Decreased active ROM due to generalized weakness; pt able to flex Rt & Lt hip in seated position  3 - 3-/5 bil. LE's                                                    Knee        WFL's bil. LE's                                                       4/5 bil. LE's                                                               Ankle WFL's bil. LE's       4-/5 bil. LE's                                                               MOBILITY/BALANCE:  []  Patient is totally dependent for mobility                                                                                               Balance Transfers Ambulation  Sitting Balance: Standing Balance: []  Independent []  Independent/Modified Independent  [x]  WFL     []  WFL []  Supervision []  Supervision  []  Uses UE for balance  []  Supervision []  Min Assist [x]  Ambulates with Assist  per caregiver report - unable to amb. At eval due to weakness                         []  Min Assist []  Min assist [x]  Mod Assist [x]  Ambulates with Device:  [x]  RW   []  StW   []  Cane   []                 []  Mod Assist [x]  Mod assist []  Max assist   []  Max Assist []  Max assist []  Dependent []  Indep. Short Distance Only  []  Unable []  Unable []  Lift / Sling Required Distance (in feet)  15'                           []  Sliding board [x]  Unable to Ambulate: (Explain: unable to amb. During eval due to weakness - pt has extreme generalized weakness and fatigue due to side effects of chemo/radiation treatments   Cardio Status:  [] Intact  [x]  Impaired   []  NA   pt has chronic CHF;  bradycardia, AV block                            Respiratory Status:  [] Intact   [x] Impaired   [] NA      h/o pulmonary embolism                               Orthotics/Prosthetics:   N/A                                                                      Comments (Address manual vs power w/c vs scooter):  Pt is unable to functionally and safely ambulate due to generalized weakness and imbalance due to side effects of chemo & radiation for treatment of esophageal cancer.  Pt is unable to stand unsupported and is at high risk for fall.  Pt's caregiver reports pt is able to ambulate with RW with assistance short distances in his home, however, pt was too weak to assess ambulation during evaluation.  Pt has h/o PE and is on anticoagulation medication, placing pt at higher risk of injury/complications if fall is sustained.  Pt is unable to perform adequate and effective weight shift due to generalized weakness and inability to stand unsupported; pt has h/o skin breakdown (in Oct. 2023 & Jan. 2025).  He is unable to propel a manual wheelchair due to his compromised cardiac function with diagnosis of CHF, generalized weakness and decreased bil. shoulder AROM and weakness.  Pt has very limited AROM Lt shoulder due to his report of h/o shoulder fracture sustained approx. Oct. 2023 due to fall.  He is in need of a power wheelchair with power tilt to enable performance of adequate and effective pressure relief to prevent skin breakdown and maintain his current skin integrity.  He is currently unable to perform adequate pressure relief due to generalized weakness and inability to stand without high fall risk.  He is unable to operate a scooter due to his inability to safely transfer on and off the platform of a scooter.  A power wheelchair with power tilt feature is a medical necessity for Mr. Chesnut to be independent and safe with household mobility and ADL's, while enabling him to adequately perform pressure relief and change positions without  fall risk.                                             Anterior / Posterior Obliquity Rotation-Pelvis  PELVIS    [] Neutral  [x]  Posterior  []  Anterior     [x] WFL  [] Right Elevated  [] Left Elevated   [x] WFL  [] Right Anterior []   Left Anterior    []  Fixed []  Partly Flexible [x]  Flexible  []  Other  []  Fixed  []  Partly Flexible  [x]  Flexible []  Other  []  Fixed  []  Partly Flexible  [x]  Flexible []  Other  TRUNK [] WFL [x] Thoracic Kyphosis [] Lumbar Lordosis   [x]  WFL [] Convex Right [] Convex Left   [] c-curve [] s-curve [] multiple  [x]  Neutral []  Left-anterior []  Right-anterior    []  Fixed []   Flexible [x]  Partly Flexible       Other  []  Fixed [x]  Flexible []  Partly Flexible []  Other  []  Fixed           [x]  Flexible []  Partly Flexible []  Other   Position Windswept   HIPS  [x]  Neutral []  Abduct []  ADduct [x]  Neutral []  Right []  Left       []  Fixed  []  Partly Flexible             []  Dislocated [x]  Flexible []  Subluxed    []  Fixed []  Partly Flexible  [x]  Flexible []  Other              Foot Positioning Knee Positioning   Knees and  Feet  [x]  WFL [x] Left [x] Right [x]  WFL [] Left [] Right   KNEES ROM concerns: ROM concerns:   & Dorsi-Flexed                    [] Lt [] Rt                                  FEET Plantar Flexed                  [] Lt [] Rt     Inversion                    [] Lt [] Rt     Eversion                    [] Lt [] Rt    HEAD [x]  Functional [x]  Good Head Control   & []  Flexed         []  Extended []  Adequate Head Control   NECK []  Rotated  Lt  []  Lat Flexed Lt []  Rotated  Rt []  Lat Flexed Rt []  Limited Head Control    []  Cervical Hyperextension []  Absent  Head Control    SHOULDERS ELBOWS WRIST& HAND         Left     Right    Left     Right  U/E [] Functional  Left            [x] Functional  Right                                 [] Fisting             [] Fisting     [] elevated Left [x] depressed  Left [] elevated  Right [] depressed  Right      [] protracted Left [] retracted Left [] protracted Right [] retracted Right [] subluxed  Left              [] subluxed  Right         Goals for Wheelchair Mobility  [x]  Independence with mobility in the home with motor related ADLs (MRADLs)  []  Independence with MRADLs in the community []  Provide dependent mobility  []  Provide recline     [x] Provide tilt   Goals for Seating system [x]  Optimize pressure distribution [x]  Provide support needed to facilitate function or safety []  Provide corrective forces to assist with maintaining or improving posture []  Accommodate client's posture: current seated postures and positions are not flexible or will not tolerate corrective forces [x]  Client to be independent with relieving pressure in the wheelchair [] Enhance physiological function such as breathing, swallowing, digestion  Simulation ideas/Equipment trials:  State why other equipment was unsuccessful:                                                                           MOBILITY BASE RECOMMENDATIONS and JUSTIFICATION: MOBILITY COMPONENT JUSTIFICATION  Manufacturer:  [employer]          Model: J6            Size: Width 16"         Seat Depth 18"             [x] provide transport from point A to B [x] promote Indep mobility  [x] is not a safe, functional ambulator [x] walker or cane inadequate [] non-standard width/depth necessary to accommodate anatomical measurement []                             [] Manual Mobility Base [] non-functional ambulator    [] Scooter/POV  [] can safely operate  [] can safely transfer   [] has adequate trunk stability  [] cannot functionally propel manual w/c  [x] Power Mobility Base  [] non-ambulatory  [x] cannot functionally propel manual wheelchair  [x]  cannot functionally and safely operate scooter/POV [x] can safely operate and willing to   [] Stroller Base [] infant/child  [] unable to propel manual wheelchair [] allows for growth [] non-functional ambulator [] non-functional UE [] Indep mobility is not a goal at this time  [x] Tilt  [] Forward                   [x] Backward                  [x] Powered tilt              [] Manual tilt  [x] change position against gravitational force on head and shoulders  [x] change position for pressure relief/cannot weight shift [] transfers  [] management of tone [x] rest periods [] control edema [x] facilitate postural control  []                                       [] Recline  [] Power recline on power base [] Manual recline on manual base  [] accommodate femur to back angle  [] bring to full recline for ADL care  [] change position for pressure relief/cannot weight shift [] rest periods [] repositioning for transfers or clothing/diaper /catheter changes [] head positioning  [] Lighter weight required [] self- propulsion  [] lifting []                                                 [] Heavy Duty required [] user weight greater than 250# [] extreme tone/ over active movement [] broken frame on previous chair []                                     [x]  Back  []  Angle Adjustable []  Custom molded       Sport back                    [x] postural control [] control of tone/spasticity []   accommodation of range of motion [] UE functional control [] accommodation for seating system []                                          [] provide lateral trunk support [] accommodate deformity [x] provide posterior trunk support [x] provide lumbar/sacral support [x] support trunk in midline [x] Pressure relief over spinal processes  [x]  Seat Cushion    Zen - skin protection  - with incontinence liner               [] impaired sensation  [] decubitus ulcers present [x] history of pressure ulceration [] prevent pelvic extension [x] low maintenance  [x] stabilize pelvis  [] accommodate obliquity [] accommodate multiple  deformity [x] neutralize lower extremity position [x] increase pressure distribution  X  With incontinence liner for skin hygiene; pt is at high risk for skin breakdown with prolonged sitting                                          []  Pelvic/thigh support  []  Lateral thigh guide []  Distal medial pad  []  Distal lateral pad []  pelvis in neutral [] accommodate pelvis []  position upper legs []  alignment []  accommodate ROM []  decrease adduction [] accommodate tone [] removable for transfers [] decrease abduction  []  Lateral trunk Supports []  Lt     []  Rt [] decrease lateral trunk leaning [] control tone [] contour for increased contact [] safety  [] accommodate asymmetry []                                                 [x]  Mounting hardware  [] lateral trunk supports  [] back   [] seat [x] headrest      []  thigh support [] fixed   [] swing away [] attach seat platform/cushion to w/c frame [] attach back cushion to w/c frame [] mount postural supports [x] mount headrest  [] swing medial thigh support away [] swing lateral supports away for transfers  []                                                     Armrests  [] fixed [x] adjustable height [] removable   [] swing away  [x] flip back   [] reclining [x] full length pads [] desk    [] pads tubular  [x] provide support with elbow at 90   [] provide support for w/c tray [x] change of height/angles for variable activities [] remove for transfers [x] allow to come closer to table top [] remove for access to tables []                                               Hangers/ Leg rests  [] 60 [] 70 [] 90 [] elevating [] heavy duty  [] articulating [] fixed [] lift off [] swing away     [] power [] provide LE support  [] accommodate to hamstring tightness [] elevate legs during recline   [] provide change in position for Legs [] Maintain placement of feet on footplate [] durability [] enable transfers [] decrease edema [] Accommodate lower leg length []   Foot support Footplate    [] Lt  []  Rt  [x]  Center mount [x] flip up                            [x] depth/angle adjustable [] Amputee adapter    []  Lt     []  Rt [x] provide foot support [x] accommodate to ankle ROM [x] transfers [] Provide support for residual extremity []  allow foot to go under wheelchair base []  decrease tone  []                                                 []  Ankle strap/heel loops [] support foot on foot support [] decrease extraneous movement [] provide input to heel  [] protect foot  Tires: [] pneumatic  [x] flat free inserts  [] solid  [x] decrease maintenance  [x] prevent frequent flats [] increase shock absorbency [] decrease pain from road shock [] decrease spasms from road shock []                                              [x]  Headrest  [] provide posterior head support [] provide posterior neck support [] provide lateral head support [] provide anterior head support [x] support during tilt and recline [] improve feeding   [] improve respiration [] placement of switches [x] safety  [] accommodate ROM  [] accommodate tone [] improve visual orientation  []  Anterior chest strap []  Vest []  Shoulder retractors  [] decrease forward movement of shoulder [] accommodation of TLSO [] decrease forward movement of trunk [] decrease shoulder elevation [] added abdominal support [] alignment [] assistance with shoulder control  []                                               Pelvic Positioner [x] Belt [] SubASIS bar [] Dual Pull [] stabilize tone [x] decrease falling out of chair/ **will not Decrease potential for sliding due to pelvic tilting [] prevent excessive rotation [] pad for protection over boney prominence [] prominence comfort [] special pull angle to control rotation []                                                  Upper ExtremitySupport  [] L   []  R [] Arm trough   [] hand support []  tray       [] full tray [] swivel mount [] decrease edema      [] decrease  subluxation   [] control tone   [] placement for AAC/Computer/EADL [] decrease gravitational pull on shoulders [] provide midline positioning [] provide support to increase UE function [] provide hand support in natural position [] provide work surface   POWER WHEELCHAIR CONTROLS  [x] Proportional  [] Non-Proportional Type   Joystick                                   [] Left  [x] Right [x] provides access for controlling wheelchair   [] lacks motor control to operate proportional drive control [] unable to understand proportional controls  Actuator Control Module  [x] Single  [] Multiple   [x] Allow the client to operate the power seat function(s) through the joystick control   []   Safety Reset Switches [] Used to change modes and stop the wheelchair when driving in latch mode    [] Upgraded Electronics   [] programming for accurate control [] progressive Disease/changing condition [] non-proportional drive control needed [] Needed in order to operate power seat functions through joystick control   [] Display box [] Allows user to see in which mode and drive the wheelchair is set  [] necessary for alternate controls    [] Digital interface electronics [] Allows w/c to operate when using alternative drive controls  [] ASL Head Array [] Allows client to operate wheelchair  through switches placed in tri-panel headrest  [] Sip and puff with tubing kit [] needed to operate sip and puff drive controls  [] Upgraded tracking electronics [] increase safety when driving [] correct tracking when on uneven surfaces  [x] Mount for switches or joystick [] Attaches switches to w/c  [x] Swing away for access or transfers [] midline for optimal placement [] provides for consistent access  [] Attendant controlled joystick plus mount [] safety [] long distance driving [] operation of seat functions [] compliance with transportation regulations []                                             Rear wheel placement/Axle  adjustability [] None [] semi adjustable [] fully adjustable  [] improved UE access to wheels [] improved stability [] changing angle in space for improvement of postural stability [] 1-arm drive access [] amputee pad placement []                                Wheel rims/ hand rims  [] metal   [] plastic coated [] oblique projections           [] vertical projections [] Provide ability to propel manual wheelchair  []  Increase self-propulsion with hand weakness/decreased grasp  Push handles [] extended   [] angle adjustable              [] standard [] caregiver access [] caregiver assist [] allows "hooking" to enable increased ability to perform ADLs or maintain balance  One armed device   [] Lt   [] Rt [] enable propulsion of manual wheelchair with one arm   []                                            Brake/wheel lock extension []  Lt   []  Rt [] increase indep in applying wheel locks   [] Side guards [] prevent clothing getting caught in wheel or becoming soiled []  prevent skin tears/abrasions  Battery:  U1 x 2                                           [x] to power wheelchair                                                         Other:  The above equipment has a life- long use expectancy. Growth and changes in medical and/or functional conditions would be the exceptions. This is to certify that the therapist has no financial relationship with durable medical provider or manufacturer. The therapist will not receive remuneration of any kind for the equipment recommended in this evaluation.   Patient has mobility limitation that significantly impairs safe, timely participation in one or more mobility related ADL's. (bathing, toileting, feeding, dressing, grooming, moving from room to room)  [x]  Yes []  No  Will mobility device sufficiently improve ability to participate and/or be aided in  participation of MRADL's?      [x]  Yes []  No  Can limitation be compensated for with use of a cane or walker?                                    []  Yes [x]  No  Does patient or caregiver demonstrate ability/potential ability & willingness to safely use the mobility device?    [x]  Yes []  No  Does patient's home environment support use of recommended mobility device?            [x]  Yes []  No  Does patient have sufficient upper extremity function necessary to functionally propel a manual wheelchair?     []  Yes [x]  No  Does patient have sufficient strength and trunk stability to safely operate a POV (scooter)?                                  []  Yes [x]  No  Does patient need additional features/benefits provided by a power wheelchair for MRADL's in the home?        [x]  Yes []  No  Does the patient demonstrate the ability to safely use a power wheelchair?                   [x]  Yes []  No     Physician's Name Printed:                                                        Physician's Signature: [name], MD Date:     This is to certify that I, the above signed therapist have the following affiliations: []  This DME provider []  Manufacturer of recommended equipment []  Patient's long term care facility [x]  None of the above  Therapist Name/Signature:  Kerry Fort, PT                                           Date:  09-12-23  ASSESSMENT:  CLINICAL IMPRESSION: Patient is a 78 y.o. gentleman who was seen today for physical therapy evaluation for power wheelchair.  Power tilt feature recommended due to h/o skin breakdown and pt is unable to safely and adequately perform pressure relief. See initial eval above for LMN.      CLINICAL DECISION MAKING: Evolving/moderate complexity  EVALUATION COMPLEXITY: Moderate   PLAN:  PT FREQUENCY: one time visit  PT DURATION: 1 week  PLANNED INTERVENTIONS: 97535- Self Care and Initial eval for power w/c .  PLAN FOR NEXT SESSION:  N/A   Kary Kos, PT 09/13/2023, 9:23 AM

## 2023-09-12 NOTE — Assessment & Plan Note (Addendum)
-  He developed a worsening thrombocytopenia since January 2025 where he was hospitalized for cellulitis and sepsis -Previous dropped to 20 4K on September 11, 2022, with increased immature platelet count.  DIC panel was positive.  Not sure if his DIC is related to his cancer. -I gave him a trial of dexamethasone 40 mg daily for 4 days to see if he responds, will give Nplate if insurance approves. -if no improve, will obtain PET and or  bone marrow biopsy to rule out a bone metastasis from his esophageal cancer.

## 2023-09-12 NOTE — Progress Notes (Signed)
 [facility] CSW Progress Note  Clinical Child psychotherapist contacted patient by phone to check in regarding supportive needs.  Pt reports he has been trying to get a motorized wheelchair for his home which he believes will be approved following an appointment later today.  Pt also stating that he would benefit from nursing support in the home.  A referral was made by the medical team for palliative home care services which will come to the home to assess pt later this week.  CSW sent a referral on behalf of pt to Cancer Services to be assessed for additional supportive services.  CSW to remain available to provide support as appropriate throughout duration of treatment.      Rachel Moulds, LCSW Clinical Social Worker [facility]

## 2023-09-12 NOTE — Assessment & Plan Note (Signed)
-  adenocarcinoma, cTxN0M0, MMR normal, HER2 (-) -Patient presented to hospital with progressive dysphagia and weight loss. -EGD showed a malignant appearing severe stenosis 30 to 34 cm from incisors.  The stenosis measured 4 cm, the scope was not able to pass.  Biopsy confirmed adenocarcinoma -He underwent PEG feeding tube for nutrition -PET scan showed hypermetabolic lesion in the distal esophagus, no nodal or distant metastasis.   -pt was evaluated by cardiothoracic surgeon Dr. Cliffton Asters, he is not a good candidate for surgery, but will re-evaluate him after chemoradiation -He started concurrent chemoradiation with weekly carbo platinum and Taxol on 05/22/23, he completed on 07/03/2023 -He was hospitalized for cellulitis in 07/2022 and his overall health has further declined  -will request PD-L1 on his previous biopsy and repeating EGD to see if he has residual disease

## 2023-09-13 ENCOUNTER — Inpatient Hospital Stay

## 2023-09-13 ENCOUNTER — Encounter

## 2023-09-13 ENCOUNTER — Inpatient Hospital Stay (HOSPITAL_BASED_OUTPATIENT_CLINIC_OR_DEPARTMENT_OTHER): Admitting: Hematology

## 2023-09-13 ENCOUNTER — Other Ambulatory Visit: Payer: Self-pay

## 2023-09-13 ENCOUNTER — Encounter: Payer: Self-pay | Admitting: Physical Therapy

## 2023-09-13 VITALS — BP 130/70 | HR 62 | Temp 97.3°F | Resp 19 | Ht 66.0 in

## 2023-09-13 DIAGNOSIS — Z95828 Presence of other vascular implants and grafts: Secondary | ICD-10-CM

## 2023-09-13 DIAGNOSIS — C155 Malignant neoplasm of lower third of esophagus: Secondary | ICD-10-CM

## 2023-09-13 DIAGNOSIS — D696 Thrombocytopenia, unspecified: Secondary | ICD-10-CM

## 2023-09-13 DIAGNOSIS — C159 Malignant neoplasm of esophagus, unspecified: Secondary | ICD-10-CM

## 2023-09-13 LAB — CBC WITH DIFFERENTIAL (CANCER CENTER ONLY)
Abs Immature Granulocytes: 0.59 10*3/uL — ABNORMAL HIGH (ref 0.00–0.07)
Basophils Absolute: 0.1 10*3/uL (ref 0.0–0.1)
Basophils Relative: 0 %
Eosinophils Absolute: 0 10*3/uL (ref 0.0–0.5)
Eosinophils Relative: 0 %
HCT: 39.4 % (ref 39.0–52.0)
Hemoglobin: 13.3 g/dL (ref 13.0–17.0)
Immature Granulocytes: 3 %
Lymphocytes Relative: 2 %
Lymphs Abs: 0.3 10*3/uL — ABNORMAL LOW (ref 0.7–4.0)
MCH: 31.1 pg (ref 26.0–34.0)
MCHC: 33.8 g/dL (ref 30.0–36.0)
MCV: 92.1 fL (ref 80.0–100.0)
Monocytes Absolute: 0.9 10*3/uL (ref 0.1–1.0)
Monocytes Relative: 5 %
Neutro Abs: 17.2 10*3/uL — ABNORMAL HIGH (ref 1.7–7.7)
Neutrophils Relative %: 90 %
Platelet Count: 24 10*3/uL — ABNORMAL LOW (ref 150–400)
RBC: 4.28 MIL/uL (ref 4.22–5.81)
RDW: 14.4 % (ref 11.5–15.5)
WBC Count: 19 10*3/uL — ABNORMAL HIGH (ref 4.0–10.5)
nRBC: 0.1 % (ref 0.0–0.2)

## 2023-09-13 LAB — CMP (CANCER CENTER ONLY)
ALT: 18 U/L (ref 0–44)
AST: 31 U/L (ref 15–41)
Albumin: 3.6 g/dL (ref 3.5–5.0)
Alkaline Phosphatase: 99 U/L (ref 38–126)
Anion gap: 10 (ref 5–15)
BUN: 37 mg/dL — ABNORMAL HIGH (ref 8–23)
CO2: 20 mmol/L — ABNORMAL LOW (ref 22–32)
Calcium: 9.1 mg/dL (ref 8.9–10.3)
Chloride: 105 mmol/L (ref 98–111)
Creatinine: 0.9 mg/dL (ref 0.61–1.24)
GFR, Estimated: >60 mL/min (ref >60)
Glucose, Bld: 173 mg/dL — ABNORMAL HIGH (ref 70–99)
Potassium: 4.7 mmol/L (ref 3.5–5.1)
Sodium: 135 mmol/L (ref 135–145)
Total Bilirubin: 0.6 mg/dL (ref 0.0–1.2)
Total Protein: 5.9 g/dL — ABNORMAL LOW (ref 6.5–8.1)

## 2023-09-13 MED ORDER — ROMIPLOSTIM 250 MCG ~~LOC~~ SOLR
2.0000 ug/kg | Freq: Once | SUBCUTANEOUS | Status: AC
  Administered 2023-09-13: 145 ug via SUBCUTANEOUS
  Filled 2023-09-13: qty 0.29

## 2023-09-13 NOTE — Progress Notes (Signed)
 [facility]   [phone] Fax:([phone]) [phone]   Clinic Follow up Note   Patient Care Team: [name], MD as PCP - General (Family Medicine) [name], MD as PCP - Cardiology (Cardiology) [name], MD as Consulting Physician (Hematology and Oncology)  Date of Service:  09/13/2023  CHIEF COMPLAINT: f/u of esophageal cancer and thrombocytopenia  CURRENT THERAPY:  Start Nplate injection weekly today  Oncology History   Malignant neoplasm of esophagus (HCC) -adenocarcinoma, cTxN0M0, MMR normal, HER2 (-) -Patient presented to hospital with progressive dysphagia and weight loss. -EGD showed a malignant appearing severe stenosis 30 to 34 cm from incisors.  The stenosis measured 4 cm, the scope was not able to pass.  Biopsy confirmed adenocarcinoma -He underwent PEG feeding tube for nutrition -PET scan showed hypermetabolic lesion in the distal esophagus, no nodal or distant metastasis.   -pt was evaluated by cardiothoracic surgeon Dr. Cliffton Asters, he is not a good candidate for surgery, but will re-evaluate him after chemoradiation -He started concurrent chemoradiation with weekly carbo platinum and Taxol on 05/22/23, he completed on 07/03/2023 -He was hospitalized for cellulitis in 07/2022 and his overall health has further declined  -will request PD-L1 on his previous biopsy and repeating EGD to see if he has residual disease   Thrombocytopenia (HCC) -He developed a worsening thrombocytopenia since January 2025 where he was hospitalized for cellulitis and sepsis -Previous dropped to 20 4K on September 11, 2022, with increased immature platelet count.  DIC panel was positive.  Not sure if his DIC is related to his cancer. -I gave him a trial of dexamethasone 40 mg daily for 4 days to see if he responds, will give Nplate if insurance approves. -if no improve, will obtain PET and or  bone marrow biopsy to rule out a bone metastasis from his esophageal  cancer.   Assessment and Plan    Esophageal cancer Concerns about potential dissemination of cancer, possibly contributing to DIC. Interest in exploring immunotherapy if indicated. PET scan and bone marrow biopsy considered to assess for dissemination. Bone marrow biopsy is more definitive for detecting cancer presence in bone marrow, with manageable discomfort using medication. PET scan requires insurance approval. - Order PET scan to assess for cancer dissemination - Schedule bone marrow biopsy to evaluate for cancer cells in bone marrow - Discuss potential for immunotherapy pending further diagnostic results  Disseminated Intravascular Coagulation (DIC) DIC diagnosed based on low platelet count (24) and symptoms such as melena. Possible causes include infection, sepsis, or cancer. Current steroid treatment ineffective in improving platelet count. Nplate injection considered to increase platelet count, though not commonly indicated for DIC and may require insurance approval. Limited benefit if underlying cause of DIC is not addressed. - Administer Nplate injection to increase platelet count - Monitor for severe bleeding, including epistaxis, hematuria, or persistent bleeding - Provide education on signs of severe bleeding and when to seek emergency care  Thrombocytopenia Persistent low platelet count since January 2025, currently at 24, with bruising and melena indicating potential bleeding issues. Steroid treatment has not improved platelet count. Nplate injection approved for administration today. - Administer Nplate injection to increase platelet count - Monitor platelet levels weekly - Evaluate response to Nplate and adjust treatment as necessary  Plan -Lab reviewed, platelet 24, no response to steroid.  Will start Nplate today and continue weekly -I recommend a bone marrow biopsy to rule out bone metastasis from esophageal cancer, and a PET scan for restaging - Schedule follow-up  appointment after PET scan and bone marrow biopsy results are available -I wrote down the above plan for him.      SUMMARY OF ONCOLOGIC HISTORY: Oncology History  Malignant neoplasm of esophagus (HCC)  04/18/2023 Cancer Staging   Staging form: Esophagus - Adenocarcinoma, AJCC 8th Edition - Clinical stage from 04/18/2023: Stage Unknown (cTX, cN0, cM0) - Signed by [name], MD on 05/04/2023 Total positive nodes: 0   04/20/2023 Initial Diagnosis   Malignant neoplasm of lower third of esophagus (HCC)   05/22/2023 -  Chemotherapy   Patient is on Treatment Plan : ESOPHAGUS Carboplatin + Paclitaxel Weekly X 6 Weeks with XRT        Discussed the use of AI scribe software for clinical note transcription with the patient, who gave verbal consent to proceed.  History of Present Illness   The patient, with a history of esophageal cancer, presents with concerns about the potential side effects of Pepcid, which he has stopped taking. He reports a persistent hematoma and recent black stools, which could indicate internal bleeding. The patient's platelet count is low, and he has been diagnosed with DIC. The patient also reports a diminishing appetite and strength, which have been ongoing issues. The patient expresses fear and anxiety about his condition and potential treatments.         All other systems were reviewed with the patient and are negative.  MEDICAL HISTORY:  Past Medical History:  Diagnosis Date   Diabetes mellitus without complication (HCC)    Esophageal cancer (HCC)    Pulmonary embolism (HCC)    In October 2024    SURGICAL HISTORY: Past Surgical History:  Procedure Laterality Date   BIOPSY  04/18/2023   Procedure: BIOPSY;  Surgeon: Lynann Bologna, DO;  Location: [facility] ENDOSCOPY;  Service: Gastroenterology;;   ESOPHAGOGASTRODUODENOSCOPY N/A 04/18/2023   Procedure: ESOPHAGOGASTRODUODENOSCOPY (EGD);  Surgeon: Lynann Bologna, DO;  Location: [facility] ENDOSCOPY;  Service:  Gastroenterology;  Laterality: N/A;   IR GASTROSTOMY TUBE MOD SED  04/20/2023   IR NASO G TUBE PLC W/FL W/RAD  04/20/2023   KNEE ARTHROPLASTY Bilateral     I have reviewed the social history and family history with the patient and they are unchanged from previous note.  ALLERGIES:  is allergic to penicillins, sulfa antibiotics, acetaminophen, and procaine.  MEDICATIONS:  Current Outpatient Medications  Medication Sig Dispense Refill   acetaminophen (TYLENOL) 160 MG/5ML solution Place 20.3 mLs (650 mg total) into feeding tube every 6 (six) hours as needed for mild pain (pain score 1-3), headache or fever.     dexamethasone (DECADRON) 4 MG tablet Take 1 tablet (4 mg total) by mouth 2 (two) times daily with a meal. 40 tablet 0   hydrocortisone cream 0.5 % Apply 1 Application topically 2 (two) times daily. 56 g 1   lactulose (CHRONULAC) 10 GM/15ML solution Place 30 mLs (20 g total) into feeding tube 2 (two) times daily as needed for moderate constipation. 236 mL 0   Nutritional Supplements (OSMOLITE 1.5 CAL PO) Place 474 mLs into feeding tube 3 (three) times daily. Give via feeding tube towards the end of tube feeding; flush with (60ml) sterile water; then give 1 packet of Prosource TF protein packet and flush with (60ml) sterile water. IF needed flush with additional (30ml) sterile water.     Nutritional Supplements (PROSOURCE TF PO) Place 1 packet into feeding tube with breakfast, with lunch, and with evening meal. Use with the last portion of tube feeding; Give  via feeding tube towards the end of tube feeding; flush with (60ml) sterile water; then give 1 packet of Prosource TF protein packet and flush with (60ml) sterile water. IF needed flush with additional (30ml) sterile water.     ondansetron (ZOFRAN) 4 MG tablet Take 1 tablet (4 mg total) by mouth every 8 (eight) hours as needed for nausea or vomiting. 30 tablet 2   oxyCODONE (OXY IR/ROXICODONE) 5 MG immediate release tablet Place  1 tablet (5 mg total) into feeding tube every 6 (six) hours as needed for moderate pain (pain score 4-6). 30 tablet 0   pantoprazole sodium (PROTONIX) 40 mg Place 40 mg into feeding tube daily. 30 packet 0   PRESCRIPTION MEDICATION Apply 1 application  topically as needed (appy to feet and ankles). Sonafine Wound Dressing     Water For Irrigation, Sterile (FREE WATER) SOLN Place 120 mLs into feeding tube 6 (six) times daily. 24000 mL 0   No current facility-administered medications for this visit.    PHYSICAL EXAMINATION: ECOG PERFORMANCE STATUS: 3 - Symptomatic, >50% confined to bed  Vitals:   09/13/23 1423  BP: 130/70  Pulse: 62  Resp: 19  Temp: (!) 97.3 F (36.3 C)  SpO2: 98%   Wt Readings from Last 3 Encounters:  09/11/23 161 lb (73 kg)  07/22/23 169 lb 8.5 oz (76.9 kg)  07/11/23 172 lb (78 kg)     GENERAL:alert, no distress and comfortable SKIN: skin color, texture, turgor are normal, no rashes or significant lesions EYES: normal, Conjunctiva are pink and non-injected, sclera clear NECK: supple, thyroid normal size, non-tender, without nodularity LYMPH:  no palpable lymphadenopathy in the cervical, axillary  LUNGS: clear to auscultation and percussion with normal breathing effort HEART: regular rate & rhythm and no murmurs and no lower extremity edema ABDOMEN:abdomen soft, non-tender and normal bowel sounds Musculoskeletal:no cyanosis of digits and no clubbing  NEURO: alert & oriented x 3 with fluent speech, no focal motor/sensory deficits   LABORATORY DATA:  I have reviewed the data as listed    Latest Ref Rng & Units 09/13/2023    1:36 PM 09/11/2023    2:24 PM 09/11/2023   12:28 PM  CBC  WBC 4.0 - 10.5 K/uL 19.0   14.6   Hemoglobin 13.0 - 17.0 g/dL 16.1   09.6   Hematocrit 39.0 - 52.0 % 39.4   42.3   Platelets 150 - 400 K/uL 24  26  24          Latest Ref Rng & Units 09/13/2023    1:36 PM 09/11/2023   12:28 PM 07/21/2023    6:34 AM  CMP  Glucose 70 - 99  mg/dL 045  409  811   BUN 8 - 23 mg/dL 37  33  21   Creatinine 0.61 - 1.24 mg/dL 9.14  7.82  9.56   Sodium 135 - 145 mmol/L 135  136  139   Potassium 3.5 - 5.1 mmol/L 4.7  4.3  4.7   Chloride 98 - 111 mmol/L 105  105  107   CO2 22 - 32 mmol/L 20  21  26    Calcium 8.9 - 10.3 mg/dL 9.1  8.6  8.0   Total Protein 6.5 - 8.1 g/dL 5.9  5.9  4.6   Total Bilirubin 0.0 - 1.2 mg/dL 0.6  0.8  0.8   Alkaline Phos 38 - 126 U/L 99  102  76   AST 15 - 41 U/L 31  30  24  ALT 0 - 44 U/L 18  16  15        RADIOGRAPHIC STUDIES: I have personally reviewed the radiological images as listed and agreed with the findings in the report. No results found.    Orders Placed This Encounter  Procedures   NM PET Image Restag (PS) Skull Base To Thigh    Standing Status:   Future    Expected Date:   09/20/2023    Expiration Date:   09/12/2024    If indicated for the ordered procedure, I authorize the administration of a radiopharmaceutical per Radiology protocol:   Yes    Preferred imaging location?:   [facility]   All questions were answered. The patient knows to call the clinic with any problems, questions or concerns. No barriers to learning was detected. The total time spent in the appointment was 40 minutes.     [name], MD 09/13/2023

## 2023-09-13 NOTE — Patient Instructions (Signed)
 Romiplostim Injection What is this medication? ROMIPLOSTIM (roe mi PLOE stim) treats low levels of platelets in your body caused by immune thrombocytopenia (ITP). It is prescribed when other medications have not worked or cannot be tolerated. It may also be used to help people who have been exposed to high doses of radiation. It works by increasing the amount of platelets in your blood. This lowers the risk of bleeding. This medicine may be used for other purposes; ask your health care provider or pharmacist if you have questions. COMMON BRAND NAME(S): Nplate What should I tell my care team before I take this medication? They need to know if you have any of these conditions: Blood clots Myelodysplastic syndrome An unusual or allergic reaction to romiplostim, mannitol, other medications, foods, dyes, or preservatives Pregnant or trying to get pregnant Breast-feeding How should I use this medication? This medication is injected under the skin. It is given by a care team in a hospital or clinic setting. A special MedGuide will be given to you before each treatment. Be sure to read this information carefully each time. Talk to your care team about the use of this medication in children. While it may be prescribed for children as young as newborns for selected conditions, precautions do apply. Overdosage: If you think you have taken too much of this medicine contact a poison control center or emergency room at once. NOTE: This medicine is only for you. Do not share this medicine with others. What if I miss a dose? Keep appointments for follow-up doses. It is important not to miss your dose. Call your care team if you are unable to keep an appointment. What may interact with this medication? Interactions are not expected. This list may not describe all possible interactions. Give your health care provider a list of all the medicines, herbs, non-prescription drugs, or dietary supplements you use. Also  tell them if you smoke, drink alcohol, or use illegal drugs. Some items may interact with your medicine. What should I watch for while using this medication? Visit your care team for regular checks on your progress. You may need blood work done while you are taking this medication. Your condition will be monitored carefully while you are receiving this medication. It is important not to miss any appointments. What side effects may I notice from receiving this medication? Side effects that you should report to your care team as soon as possible: Allergic reactions--skin rash, itching, hives, swelling of the face, lips, tongue, or throat Blood clot--pain, swelling, or warmth in the leg, shortness of breath, chest pain Side effects that usually do not require medical attention (report to your care team if they continue or are bothersome): Dizziness Joint pain Muscle pain Pain in the hands or feet Stomach pain Trouble sleeping This list may not describe all possible side effects. Call your doctor for medical advice about side effects. You may report side effects to FDA at 1-800-FDA-1088. Where should I keep my medication? This medication is given in a hospital or clinic. It will not be stored at home. NOTE: This sheet is a summary. It may not cover all possible information. If you have questions about this medicine, talk to your doctor, pharmacist, or health care provider.  2024 Elsevier/Gold Standard (2021-10-25 00:00:00)

## 2023-09-14 ENCOUNTER — Other Ambulatory Visit: Payer: Self-pay

## 2023-09-14 LAB — METHYLMALONIC ACID, SERUM: Methylmalonic Acid, Quantitative: 305 nmol/L (ref 0–378)

## 2023-09-15 ENCOUNTER — Emergency Department (HOSPITAL_COMMUNITY)

## 2023-09-15 ENCOUNTER — Other Ambulatory Visit: Payer: Self-pay

## 2023-09-15 ENCOUNTER — Encounter (HOSPITAL_COMMUNITY): Payer: Self-pay | Admitting: Emergency Medicine

## 2023-09-15 ENCOUNTER — Encounter (HOSPITAL_COMMUNITY): Admission: RE | Admit: 2023-09-15 | Source: Ambulatory Visit

## 2023-09-15 ENCOUNTER — Inpatient Hospital Stay (HOSPITAL_COMMUNITY)
Admission: EM | Admit: 2023-09-15 | Discharge: 2023-09-21 | DRG: 813 | Disposition: A | Discharge status: Hospice | Attending: Internal Medicine | Admitting: Internal Medicine

## 2023-09-15 DIAGNOSIS — D62 Acute posthemorrhagic anemia: Secondary | ICD-10-CM | POA: Diagnosis not present

## 2023-09-15 DIAGNOSIS — I472 Ventricular tachycardia, unspecified: Secondary | ICD-10-CM | POA: Diagnosis not present

## 2023-09-15 DIAGNOSIS — I4510 Unspecified right bundle-branch block: Secondary | ICD-10-CM | POA: Diagnosis present

## 2023-09-15 DIAGNOSIS — C155 Malignant neoplasm of lower third of esophagus: Secondary | ICD-10-CM | POA: Diagnosis present

## 2023-09-15 DIAGNOSIS — R0689 Other abnormalities of breathing: Secondary | ICD-10-CM | POA: Diagnosis not present

## 2023-09-15 DIAGNOSIS — Z886 Allergy status to analgesic agent status: Secondary | ICD-10-CM

## 2023-09-15 DIAGNOSIS — I700 Atherosclerosis of aorta: Secondary | ICD-10-CM | POA: Diagnosis not present

## 2023-09-15 DIAGNOSIS — D72829 Elevated white blood cell count, unspecified: Secondary | ICD-10-CM | POA: Diagnosis present

## 2023-09-15 DIAGNOSIS — D693 Immune thrombocytopenic purpura: Secondary | ICD-10-CM

## 2023-09-15 DIAGNOSIS — Z7189 Other specified counseling: Secondary | ICD-10-CM | POA: Diagnosis not present

## 2023-09-15 DIAGNOSIS — E119 Type 2 diabetes mellitus without complications: Secondary | ICD-10-CM | POA: Diagnosis present

## 2023-09-15 DIAGNOSIS — K921 Melena: Secondary | ICD-10-CM | POA: Diagnosis present

## 2023-09-15 DIAGNOSIS — I5022 Chronic systolic (congestive) heart failure: Secondary | ICD-10-CM | POA: Diagnosis present

## 2023-09-15 DIAGNOSIS — R791 Abnormal coagulation profile: Secondary | ICD-10-CM | POA: Diagnosis present

## 2023-09-15 DIAGNOSIS — R1314 Dysphagia, pharyngoesophageal phase: Secondary | ICD-10-CM | POA: Diagnosis present

## 2023-09-15 DIAGNOSIS — L89152 Pressure ulcer of sacral region, stage 2: Secondary | ICD-10-CM | POA: Diagnosis present

## 2023-09-15 DIAGNOSIS — T402X5A Adverse effect of other opioids, initial encounter: Secondary | ICD-10-CM | POA: Diagnosis not present

## 2023-09-15 DIAGNOSIS — Z862 Personal history of diseases of the blood and blood-forming organs and certain disorders involving the immune mechanism: Secondary | ICD-10-CM

## 2023-09-15 DIAGNOSIS — Z6825 Body mass index (BMI) 25.0-25.9, adult: Secondary | ICD-10-CM

## 2023-09-15 DIAGNOSIS — Z66 Do not resuscitate: Secondary | ICD-10-CM | POA: Diagnosis present

## 2023-09-15 DIAGNOSIS — F419 Anxiety disorder, unspecified: Secondary | ICD-10-CM | POA: Diagnosis present

## 2023-09-15 DIAGNOSIS — R9431 Abnormal electrocardiogram [ECG] [EKG]: Secondary | ICD-10-CM | POA: Diagnosis not present

## 2023-09-15 DIAGNOSIS — Z515 Encounter for palliative care: Secondary | ICD-10-CM | POA: Diagnosis not present

## 2023-09-15 DIAGNOSIS — Z880 Allergy status to penicillin: Secondary | ICD-10-CM

## 2023-09-15 DIAGNOSIS — Z96653 Presence of artificial knee joint, bilateral: Secondary | ICD-10-CM | POA: Diagnosis present

## 2023-09-15 DIAGNOSIS — Z87891 Personal history of nicotine dependence: Secondary | ICD-10-CM

## 2023-09-15 DIAGNOSIS — Z86718 Personal history of other venous thrombosis and embolism: Secondary | ICD-10-CM

## 2023-09-15 DIAGNOSIS — C159 Malignant neoplasm of esophagus, unspecified: Secondary | ICD-10-CM | POA: Diagnosis present

## 2023-09-15 DIAGNOSIS — Z9221 Personal history of antineoplastic chemotherapy: Secondary | ICD-10-CM

## 2023-09-15 DIAGNOSIS — D689 Coagulation defect, unspecified: Secondary | ICD-10-CM | POA: Diagnosis not present

## 2023-09-15 DIAGNOSIS — E440 Moderate protein-calorie malnutrition: Secondary | ICD-10-CM | POA: Diagnosis not present

## 2023-09-15 DIAGNOSIS — E43 Unspecified severe protein-calorie malnutrition: Secondary | ICD-10-CM | POA: Diagnosis present

## 2023-09-15 DIAGNOSIS — Z931 Gastrostomy status: Secondary | ICD-10-CM

## 2023-09-15 DIAGNOSIS — K922 Gastrointestinal hemorrhage, unspecified: Secondary | ICD-10-CM | POA: Diagnosis not present

## 2023-09-15 DIAGNOSIS — Z923 Personal history of irradiation: Secondary | ICD-10-CM | POA: Diagnosis not present

## 2023-09-15 DIAGNOSIS — D696 Thrombocytopenia, unspecified: Principal | ICD-10-CM

## 2023-09-15 DIAGNOSIS — Z8249 Family history of ischemic heart disease and other diseases of the circulatory system: Secondary | ICD-10-CM

## 2023-09-15 DIAGNOSIS — Z8501 Personal history of malignant neoplasm of esophagus: Secondary | ICD-10-CM | POA: Diagnosis not present

## 2023-09-15 DIAGNOSIS — Z888 Allergy status to other drugs, medicaments and biological substances status: Secondary | ICD-10-CM

## 2023-09-15 DIAGNOSIS — K920 Hematemesis: Secondary | ICD-10-CM | POA: Diagnosis not present

## 2023-09-15 DIAGNOSIS — D65 Disseminated intravascular coagulation [defibrination syndrome]: Secondary | ICD-10-CM | POA: Diagnosis present

## 2023-09-15 DIAGNOSIS — R197 Diarrhea, unspecified: Secondary | ICD-10-CM | POA: Diagnosis not present

## 2023-09-15 DIAGNOSIS — Z833 Family history of diabetes mellitus: Secondary | ICD-10-CM

## 2023-09-15 DIAGNOSIS — Z86711 Personal history of pulmonary embolism: Secondary | ICD-10-CM | POA: Diagnosis not present

## 2023-09-15 DIAGNOSIS — K869 Disease of pancreas, unspecified: Secondary | ICD-10-CM | POA: Diagnosis present

## 2023-09-15 DIAGNOSIS — Z825 Family history of asthma and other chronic lower respiratory diseases: Secondary | ICD-10-CM

## 2023-09-15 DIAGNOSIS — K6289 Other specified diseases of anus and rectum: Secondary | ICD-10-CM | POA: Diagnosis not present

## 2023-09-15 DIAGNOSIS — Z882 Allergy status to sulfonamides status: Secondary | ICD-10-CM

## 2023-09-15 LAB — CBC WITH DIFFERENTIAL/PLATELET
Abs Immature Granulocytes: 0.5 10*3/uL — ABNORMAL HIGH (ref 0.00–0.07)
Abs Immature Granulocytes: 1.42 10*3/uL — ABNORMAL HIGH (ref 0.00–0.07)
Band Neutrophils: 1 %
Basophils Absolute: 0 10*3/uL (ref 0.0–0.1)
Basophils Absolute: 0.1 10*3/uL (ref 0.0–0.1)
Basophils Relative: 0 %
Basophils Relative: 0 %
Eosinophils Absolute: 0 10*3/uL (ref 0.0–0.5)
Eosinophils Absolute: 0 10*3/uL (ref 0.0–0.5)
Eosinophils Relative: 0 %
Eosinophils Relative: 0 %
HCT: 39.4 % (ref 39.0–52.0)
HCT: 32.7 % — ABNORMAL LOW (ref 39.0–52.0)
Hemoglobin: 12.4 g/dL — ABNORMAL LOW (ref 13.0–17.0)
Hemoglobin: 10.1 g/dL — ABNORMAL LOW (ref 13.0–17.0)
Immature Granulocytes: 6 %
Lymphocytes Relative: 0 %
Lymphocytes Relative: 2 %
Lymphs Abs: 0 10*3/uL — ABNORMAL LOW (ref 0.7–4.0)
Lymphs Abs: 0.4 10*3/uL — ABNORMAL LOW (ref 0.7–4.0)
MCH: 31.2 pg (ref 26.0–34.0)
MCH: 31.4 pg (ref 26.0–34.0)
MCHC: 31.5 g/dL (ref 30.0–36.0)
MCHC: 30.9 g/dL (ref 30.0–36.0)
MCV: 99.2 fL (ref 80.0–100.0)
MCV: 101.6 fL — ABNORMAL HIGH (ref 80.0–100.0)
Monocytes Absolute: 2.9 10*3/uL — ABNORMAL HIGH (ref 0.1–1.0)
Monocytes Absolute: 2.6 10*3/uL — ABNORMAL HIGH (ref 0.1–1.0)
Monocytes Relative: 11 %
Monocytes Relative: 11 %
Myelocytes: 2 %
Neutro Abs: 23.1 10*3/uL — ABNORMAL HIGH (ref 1.7–7.7)
Neutro Abs: 18.8 10*3/uL — ABNORMAL HIGH (ref 1.7–7.7)
Neutrophils Relative %: 86 %
Neutrophils Relative %: 81 %
Platelets: 12 10*3/uL — CL (ref 150–400)
Platelets: 55 10*3/uL — ABNORMAL LOW (ref 150–400)
RBC: 3.97 MIL/uL — ABNORMAL LOW (ref 4.22–5.81)
RBC: 3.22 MIL/uL — ABNORMAL LOW (ref 4.22–5.81)
RDW: 14.5 % (ref 11.5–15.5)
RDW: 14.6 % (ref 11.5–15.5)
WBC: 26.5 10*3/uL — ABNORMAL HIGH (ref 4.0–10.5)
WBC: 23.3 10*3/uL — ABNORMAL HIGH (ref 4.0–10.5)
nRBC: 0.5 % — ABNORMAL HIGH (ref 0.0–0.2)
nRBC: 0.7 % — ABNORMAL HIGH (ref 0.0–0.2)

## 2023-09-15 LAB — HEMOGLOBIN AND HEMATOCRIT, BLOOD
HCT: 34.2 % — ABNORMAL LOW (ref 39.0–52.0)
Hemoglobin: 10.8 g/dL — ABNORMAL LOW (ref 13.0–17.0)

## 2023-09-15 LAB — COMPREHENSIVE METABOLIC PANEL
ALT: 31 U/L (ref 0–44)
AST: 46 U/L — ABNORMAL HIGH (ref 15–41)
Albumin: 3.1 g/dL — ABNORMAL LOW (ref 3.5–5.0)
Alkaline Phosphatase: 78 U/L (ref 38–126)
Anion gap: 8 (ref 5–15)
BUN: 47 mg/dL — ABNORMAL HIGH (ref 8–23)
CO2: 24 mmol/L (ref 22–32)
Calcium: 9.3 mg/dL (ref 8.9–10.3)
Chloride: 105 mmol/L (ref 98–111)
Creatinine, Ser: 0.84 mg/dL (ref 0.61–1.24)
GFR, Estimated: >60 mL/min (ref >60)
Glucose, Bld: 106 mg/dL — ABNORMAL HIGH (ref 70–99)
Potassium: 4.4 mmol/L (ref 3.5–5.1)
Sodium: 137 mmol/L (ref 135–145)
Total Bilirubin: 0.7 mg/dL (ref 0.0–1.2)
Total Protein: 5.5 g/dL — ABNORMAL LOW (ref 6.5–8.1)

## 2023-09-15 LAB — URINALYSIS, ROUTINE W REFLEX MICROSCOPIC
Bilirubin Urine: NEGATIVE
Glucose, UA: NEGATIVE mg/dL
Hgb urine dipstick: NEGATIVE
Ketones, ur: NEGATIVE mg/dL
Leukocytes,Ua: NEGATIVE
Nitrite: NEGATIVE
Protein, ur: NEGATIVE mg/dL
Specific Gravity, Urine: 1.019 (ref 1.005–1.030)
pH: 5 (ref 5.0–8.0)

## 2023-09-15 LAB — MAGNESIUM: Magnesium: 2.2 mg/dL (ref 1.7–2.4)

## 2023-09-15 LAB — DIC (DISSEMINATED INTRAVASCULAR COAGULATION)PANEL
D-Dimer, Quant: 18.85 ug{FEU}/mL — ABNORMAL HIGH (ref 0.00–0.50)
D-Dimer, Quant: 16.38 ug{FEU}/mL — ABNORMAL HIGH (ref 0.00–0.50)
Fibrinogen: >800 mg/dL — ABNORMAL HIGH (ref 210–475)
Fibrinogen: >800 mg/dL — ABNORMAL HIGH (ref 210–475)
INR: 9.2 (ref 0.8–1.2)
INR: 3.6 — ABNORMAL HIGH (ref 0.8–1.2)
Platelets: 12 10*3/uL — CL (ref 150–400)
Platelets: 55 10*3/uL — ABNORMAL LOW (ref 150–400)
Prothrombin Time: 75 s — ABNORMAL HIGH (ref 11.4–15.2)
Prothrombin Time: 36.1 s — ABNORMAL HIGH (ref 11.4–15.2)
Smear Review: NONE SEEN
Smear Review: NONE SEEN
aPTT: >200 s (ref 24–36)
aPTT: 40 s — ABNORMAL HIGH (ref 24–36)

## 2023-09-15 LAB — POC OCCULT BLOOD, ED: Fecal Occult Bld: POSITIVE — AB

## 2023-09-15 LAB — MRSA NEXT GEN BY PCR, NASAL: MRSA by PCR Next Gen: NOT DETECTED

## 2023-09-15 LAB — ABO/RH: ABO/RH(D): O POS

## 2023-09-15 LAB — IMMATURE PLATELET FRACTION: Immature Platelet Fraction: 4.2 % (ref 1.2–8.6)

## 2023-09-15 MED ORDER — METRONIDAZOLE 500 MG/100ML IV SOLN
500.0000 mg | Freq: Once | INTRAVENOUS | Status: AC
  Administered 2023-09-15: 500 mg via INTRAVENOUS
  Filled 2023-09-15: qty 100

## 2023-09-15 MED ORDER — ROMIPLOSTIM 250 MCG ~~LOC~~ SOLR: 2.0000 ug/kg | Freq: Once | SUBCUTANEOUS | Status: DC

## 2023-09-15 MED ORDER — TRAZODONE HCL 100 MG PO TABS
100.0000 mg | ORAL_TABLET | Freq: Every evening | ORAL | Status: DC | PRN
  Administered 2023-09-15 – 2023-09-19 (×4): 100 mg
  Filled 2023-09-15 (×3): qty 2

## 2023-09-15 MED ORDER — DOCUSATE SODIUM 100 MG PO CAPS: 100.0000 mg | ORAL_CAPSULE | Freq: Two times a day (BID) | ORAL | Status: DC | PRN

## 2023-09-15 MED ORDER — NYSTATIN 100000 UNIT/ML MT SUSP
5.0000 mL | Freq: Four times a day (QID) | OROMUCOSAL | Status: DC
  Administered 2023-09-16 – 2023-09-17 (×6): 500000 [IU] via ORAL
  Filled 2023-09-15 (×10): qty 5

## 2023-09-15 MED ORDER — SODIUM CHLORIDE 0.9% IV SOLUTION: Freq: Once | INTRAVENOUS | Status: AC

## 2023-09-15 MED ORDER — VITAMIN K1 10 MG/ML IJ SOLN
1.0000 mg | Freq: Once | INTRAVENOUS | Status: AC
  Administered 2023-09-15: 1 mg via INTRAVENOUS
  Filled 2023-09-15: qty 0.1

## 2023-09-15 MED ORDER — SODIUM CHLORIDE 0.9 % IV SOLN
2.0000 g | Freq: Once | INTRAVENOUS | Status: AC
  Administered 2023-09-15: 2 g via INTRAVENOUS
  Filled 2023-09-15: qty 12.5

## 2023-09-15 MED ORDER — ROMIPLOSTIM 250 MCG ~~LOC~~ SOLR
2.0000 ug/kg | Freq: Once | SUBCUTANEOUS | Status: AC
Start: 2023-09-20 — End: 2023-09-20
  Administered 2023-09-20: 145 ug via SUBCUTANEOUS
  Filled 2023-09-15: qty 0.29

## 2023-09-15 MED ORDER — CHLORHEXIDINE GLUCONATE CLOTH 2 % EX PADS
6.0000 | MEDICATED_PAD | Freq: Every day | CUTANEOUS | Status: DC
  Administered 2023-09-15 – 2023-09-20 (×6): 6 via TOPICAL

## 2023-09-15 MED ORDER — SODIUM CHLORIDE 0.9 % IV BOLUS
1000.0000 mL | Freq: Once | INTRAVENOUS | Status: AC
  Administered 2023-09-15: 1000 mL via INTRAVENOUS

## 2023-09-15 MED ORDER — SODIUM CHLORIDE 0.9 % IV SOLN
2.0000 g | INTRAVENOUS | Status: DC
  Administered 2023-09-15 – 2023-09-18 (×4): 2 g via INTRAVENOUS
  Filled 2023-09-15 (×4): qty 20

## 2023-09-15 MED ORDER — MUSCLE RUB 10-15 % EX CREA
TOPICAL_CREAM | CUTANEOUS | Status: DC | PRN
  Administered 2023-09-15 – 2023-09-16 (×2): 1 via TOPICAL
  Filled 2023-09-15: qty 85

## 2023-09-15 MED ORDER — POLYETHYLENE GLYCOL 3350 17 G PO PACK: 17.0000 g | PACK | Freq: Every day | ORAL | Status: DC | PRN

## 2023-09-15 MED ORDER — DEXAMETHASONE 4 MG PO TABS: 10.0000 mg | ORAL_TABLET | Freq: Four times a day (QID) | ORAL | Status: DC

## 2023-09-15 MED ORDER — VANCOMYCIN HCL 1500 MG/300ML IV SOLN
1500.0000 mg | INTRAVENOUS | Status: DC
Start: 2023-09-16 — End: 2023-09-16
  Filled 2023-09-15: qty 300

## 2023-09-15 MED ORDER — ORAL CARE MOUTH RINSE: 15.0000 mL | OROMUCOSAL | Status: DC | PRN

## 2023-09-15 MED ORDER — PANTOPRAZOLE SODIUM 40 MG IV SOLR
40.0000 mg | Freq: Two times a day (BID) | INTRAVENOUS | Status: DC
  Administered 2023-09-15 – 2023-09-20 (×10): 40 mg via INTRAVENOUS
  Filled 2023-09-15 (×10): qty 10

## 2023-09-15 MED ORDER — SODIUM CHLORIDE 0.9 % IV SOLN: INTRAVENOUS | Status: AC | PRN

## 2023-09-15 MED ORDER — PANTOPRAZOLE SODIUM 40 MG IV SOLR
40.0000 mg | Freq: Once | INTRAVENOUS | Status: AC
  Administered 2023-09-15: 40 mg via INTRAVENOUS
  Filled 2023-09-15: qty 10

## 2023-09-15 MED ORDER — IOHEXOL 350 MG/ML SOLN
100.0000 mL | Freq: Once | INTRAVENOUS | Status: AC | PRN
  Administered 2023-09-15: 100 mL via INTRAVENOUS

## 2023-09-15 MED ORDER — TRAZODONE HCL 50 MG PO TABS
100.0000 mg | ORAL_TABLET | Freq: Every evening | ORAL | Status: DC | PRN
Start: 2023-09-15 — End: 2023-09-15
  Filled 2023-09-15: qty 2

## 2023-09-15 MED ORDER — OXYCODONE HCL 5 MG PO TABS
5.0000 mg | ORAL_TABLET | Freq: Four times a day (QID) | ORAL | Status: DC | PRN
  Administered 2023-09-16 – 2023-09-21 (×12): 5 mg
  Filled 2023-09-15 (×13): qty 1

## 2023-09-15 MED ORDER — VANCOMYCIN HCL IN DEXTROSE 1-5 GM/200ML-% IV SOLN
1000.0000 mg | Freq: Once | INTRAVENOUS | Status: AC
  Administered 2023-09-15: 1000 mg via INTRAVENOUS
  Filled 2023-09-15: qty 200

## 2023-09-15 NOTE — ED Notes (Signed)
 Blood bank has platelets ready for this patient.  Notified Angela,RN.

## 2023-09-15 NOTE — ED Notes (Signed)
 Started attempting to get blood cultures Provider at bedside attempted to assess patient will look at drawing labs when provider is finished.

## 2023-09-15 NOTE — ED Notes (Signed)
 Report given to Morrie Sheldon, [place]

## 2023-09-15 NOTE — Progress Notes (Signed)
 Pharmacy Antibiotic Note  Ritesh Opara is a 78 y.o. male admitted on 09/15/2023 with GI bleeding, esophageal cancer, thrombocytopenia, DIC.  Pharmacy has been consulted for Vancomycin and ceftriaxone dosing for sepsis.    Plan: Ceftriaxone 2g IV q24h Vancomycin 1g x1 then 1500 mg IV q24h  (SCr 0.84, est AUC 480)  Measure Vanc levels as needed.  Goal AUC = 400 - 550  Follow up renal function, culture results, and clinical course.   Height: 5\' 6"  (167.6 cm) Weight: 73 kg (160 lb 15 oz) IBW/kg (Calculated) : 63.8  Temp (24hrs), Avg:97.7 F (36.5 C), Min:97.5 F (36.4 C), Max:97.9 F (36.6 C)  Recent Labs  Lab 09/11/23 1228 09/13/23 1336 09/15/23 1030  WBC 14.6* 19.0* 26.5*  CREATININE 0.96 0.90 0.84    Estimated Creatinine Clearance: 66.5 mL/min (by C-G formula based on SCr of 0.84 mg/dL).    Allergies  Allergen Reactions   Penicillins     Shortness of breath   Sulfa Antibiotics     Blisters    Acetaminophen Other (See Comments)    Caused problems with urinary tract   Procaine Other (See Comments)    Caused fatigue    Antimicrobials this admission: 3/14 Cefepime x1 3/14 metronidazole x1 3/14 vancomycin >>  3/14 ceftriaxone >>   Microbiology results: 3/14 BCx:   Thank you for allowing pharmacy to be a part of this patient's care.  Lynann Beaver PharmD, BCPS [facility] main pharmacy [phone] 09/15/2023 5:25 PM

## 2023-09-15 NOTE — H&P (Signed)
 NAME:  Samuel [name], MRN:  [id number], DOB:  Aug 22, 1945, LOS: 0 ADMISSION DATE:  09/15/2023, CONSULTATION DATE:  09/15/23 REFERRING MD:  Montel Clock PA, EM, CHIEF COMPLAINT:  melena    History of Present Illness:  78yo M PMH esophageal cancer s/p chemoradiation, cellulitis (hospitalized 1/14-1/19), functional decline, Hx DVT/PE (202 who was recently taken off eliquis 09/11/2023 in setting of worsening thrombocytopenia / possible DIC on outpt onc labs from 09/11/23 visit -- Ddimer >20, fibrinogen 89, INR 2.2, plt 26, though no schistos. Was Rx dexamethasone, and returned to onc 09/13/23 for Nplate.   Presented to ED 3/14 for CC melena, associated weakness and "hunger pains."  Reports melena x at least 3-4 days.  In ED 3/14, pt is HDS. Labs w Ddimer 18, Fibrinogen > 800, INR 9.2, plt 12, and WBC of 26.  Received 1mg  Vit K, PPI,  1 pack of plt. Also started on vanc cefepime zosyn.   GI was consulted re melena. CTA a/p without source of bleeding.   PCCM called for admission in setting of possible DIC     Pertinent  Medical History  Esophageal ca s/p chemo radiation Physical deconditioning PE/DVT Chronic AC Cellulitis HFmrEF   Significant Hospital Events: Including procedures, antibiotic start and stop dates in addition to other pertinent events   3/10 onc visit labs c/f DIC Rx steroids, eliquis stopped  3/12 return onc for Nplate  3/14 ED for melena, reportedly several days. Abnormal DIC panel. Rcvd 1 plt for Plt 12 and 1mg   Vit K for INE 9.72for GI consulted. PCCM to admit   Interim History / Subjective:  Completed 1 plt   Objective   Blood pressure 112/85, pulse 98, temperature (!) 97.5 F (36.4 C), resp. rate (!) 21, height 5\' 6"  (1.676 m), weight 73 kg, SpO2 95%.        Intake/Output Summary (Last 24 hours) at 09/15/2023 1721 Last data filed at 09/15/2023 1702 Gross per 24 hour  Intake 1456.68 ml  Output --  Net 1456.68 ml   Filed Weights   09/15/23 0945  Weight: 73 kg     Examination: General: chronically ill appearing older adult M NAD  HENT: NCAT  Lungs: CTAb on RA  Cardiovascular: irir cap refill < 3 sec Abdomen: soft, mild tenderness. Gtube  Neuro: Awake alert. No obvious focal deficits. Some repetitiveness/ perseveration  GU: defer  Resolved Hospital Problem list     Assessment & Plan:   Hx esophageal cancer -s/p chemo rad -felt poor surgical candidate by cvts -on observation now. 07/2023 PET had no mets, did have some esophageal activity -- felt likely inflammation but can't r/o residual cancer  P -will reach out to onc. Dr. Mosetta Putt is his primary   Melena -GI has been consulted, no intervention w significant heme abnormalities -Cta a/p without source of bleeding -BID PPI -transfuse PRN  -will hold EN for now   Thrombocytopenia Coagulopathy  Anemia  ?DIC  -in onc office 3/10 DIC panel mostly c/w DIC (Ddimer >20, fibrinogen 89, INR 2.2, plt 26. No schistos, but that does not entirely r/o DIC. Immature plt fraction was high at 20 -- suggestive of plt destruction (DIC, ITP). Notably eliquis stopped 3/10 in setting of this & started on dexamethasone  -rcvd nplate 3/12 -DIC panel 3/14 Ddimer 18, fibrinogen > 800,  INR 9.2, plt 12, again no schistos.  -received 1 plt and 1 mg Vit K 3/14 P -repeat DIC panel + immature plt fraction -- pretty marked difference between  DIC panel 3/10 and 3/14.  -additional product pending repeat DIC panel  -will reach out to oncology  -unclear to me if he has been taking the prescribed 40mg  dexamethasone x 4d outlined by Dr. Mosetta Putt 3/10 -- I have ordered 4d course, if we find that he has been taking, can adjust my order's duration accordingly   Leukocytosis -rcvd nplate 3/12 -rcvd vanc cefepime flagyl in ED and bcx sent  P -think likely r/t Nplate but with his overall picture, will cont vanc rocephin for now and de-escalate as able -follow Bcx   Hx LLE DVT -holding eliquis in setting of above   New RBBB   -no ACS sx  -on monitor in room, at times looked like fib  P -repeat EKG -optimize lytes   Best Practice (right click and "Reselect all SmartList Selections" daily)   Diet/type: tubefeeds DVT prophylaxis SCD Pressure ulcer(s): pressure ulcer assessment deferred  GI prophylaxis: PPI Lines: N/A Foley:  N/A Code Status:  full code Last date of multidisciplinary goals of care discussion [09/15/23. Advanced directives reviewed]  Labs   CBC: Recent Labs  Lab 09/11/23 1228 09/11/23 1424 09/13/23 1336 09/15/23 1003 09/15/23 1030  WBC 14.6*  --  19.0*  --  26.5*  NEUTROABS 11.3*  --  17.2*  --  23.1*  HGB 14.3  --  13.3  --  12.4*  HCT 42.3  --  39.4  --  39.4  MCV 93.0  --  92.1  --  99.2  PLT 24* 26* 24* 12* 12*    Basic Metabolic Panel: Recent Labs  Lab 09/11/23 1228 09/13/23 1336 09/15/23 1030  NA 136 135 137  K 4.3 4.7 4.4  CL 105 105 105  CO2 21* 20* 24  GLUCOSE 184* 173* 106*  BUN 33* 37* 47*  CREATININE 0.96 0.90 0.84  CALCIUM 8.6* 9.1 9.3   GFR: Estimated Creatinine Clearance: 66.5 mL/min (by C-G formula based on SCr of 0.84 mg/dL). Recent Labs  Lab 09/11/23 1228 09/13/23 1336 09/15/23 1030  WBC 14.6* 19.0* 26.5*    Liver Function Tests: Recent Labs  Lab 09/11/23 1228 09/13/23 1336 09/15/23 1030  AST 30 31 46*  ALT 16 18 31   ALKPHOS 102 99 78  BILITOT 0.8 0.6 0.7  PROT 5.9* 5.9* 5.5*  ALBUMIN 3.4* 3.6 3.1*   No results for input(s): "LIPASE", "AMYLASE" in the last 168 hours. No results for input(s): "AMMONIA" in the last 168 hours.  ABG No results found for: "PHART", "PCO2ART", "PO2ART", "HCO3", "TCO2", "ACIDBASEDEF", "O2SAT"   Coagulation Profile: Recent Labs  Lab 09/11/23 1424 09/15/23 1003  INR 2.2* 9.2*    Cardiac Enzymes: No results for input(s): "CKTOTAL", "CKMB", "CKMBINDEX", "TROPONINI" in the last 168 hours.  HbA1C: HbA1c, POC (prediabetic range)  Date/Time Value Ref Range Status  08/09/2022 01:50 PM 6.2 5.7 - 6.4 %  Final   HbA1c, POC (controlled diabetic range)  Date/Time Value Ref Range Status  02/01/2022 02:03 PM 6.3 0.0 - 7.0 % Final   Hgb A1c MFr Bld  Date/Time Value Ref Range Status  05/23/2023 05:50 AM 6.4 (H) 4.8 - 5.6 % Final    Comment:    (NOTE) Pre diabetes:          5.7%-6.4%  Diabetes:              >6.4%  Glycemic control for   <7.0% adults with diabetes   12/15/2022 10:54 AM 7.0 (H) 4.8 - 5.6 % Final    Comment:  Prediabetes: 5.7 - 6.4          Diabetes: >6.4          Glycemic control for adults with diabetes: <7.0     CBG: No results for input(s): "GLUCAP" in the last 168 hours.  Review of Systems:   Review of Systems  Constitutional:  Positive for malaise/fatigue and weight loss.  Gastrointestinal:  Positive for melena.  Endo/Heme/Allergies:  Bruises/bleeds easily.    Past Medical History:  He,  has a past medical history of Diabetes mellitus without complication (HCC), Esophageal cancer (HCC), and Pulmonary embolism (HCC).   Surgical History:   Past Surgical History:  Procedure Laterality Date   BIOPSY  04/18/2023   Procedure: BIOPSY;  Surgeon: Lynann Bologna, DO;  Location: [facility] ENDOSCOPY;  Service: Gastroenterology;;   ESOPHAGOGASTRODUODENOSCOPY N/A 04/18/2023   Procedure: ESOPHAGOGASTRODUODENOSCOPY (EGD);  Surgeon: Lynann Bologna, DO;  Location: [facility] ENDOSCOPY;  Service: Gastroenterology;  Laterality: N/A;   IR GASTROSTOMY TUBE MOD SED  04/20/2023   IR NASO G TUBE PLC W/FL W/RAD  04/20/2023   KNEE ARTHROPLASTY Bilateral      Social History:   reports that he quit smoking about 38 years ago. His smoking use included cigarettes. He started smoking about 53 years ago. He has a 15 pack-year smoking history. He has never used smokeless tobacco. He reports current alcohol use of about 1.0 - 2.0 standard drink of alcohol per week. He reports that he does not use drugs.   Family History:  His family history includes COPD in his father and  sister; Diabetes in his maternal grandfather; Hypertension in his father.   Allergies Allergies  Allergen Reactions   Penicillins     Shortness of breath   Sulfa Antibiotics     Blisters    Acetaminophen Other (See Comments)    Caused problems with urinary tract   Procaine Other (See Comments)    Caused fatigue     Home Medications  Prior to Admission medications   Medication Sig Start Date End Date Taking? Authorizing Provider  acetaminophen (TYLENOL) 160 MG/5ML solution Place 20.3 mLs (650 mg total) into feeding tube every 6 (six) hours as needed for mild pain (pain score 1-3), headache or fever. 07/23/23   Lonia Blood, MD  dexamethasone (DECADRON) 4 MG tablet Take 1 tablet (4 mg total) by mouth 2 (two) times daily with a meal. 09/11/23   [name], MD  hydrocortisone cream 0.5 % Apply 1 Application topically 2 (two) times daily. 08/16/23   [name], MD  lactulose (CHRONULAC) 10 GM/15ML solution Place 30 mLs (20 g total) into feeding tube 2 (two) times daily as needed for moderate constipation. 07/23/23   Lonia Blood, MD  Nutritional Supplements (OSMOLITE 1.5 CAL PO) Place 474 mLs into feeding tube 3 (three) times daily. Give via feeding tube towards the end of tube feeding; flush with (60ml) sterile water; then give 1 packet of Prosource TF protein packet and flush with (60ml) sterile water. IF needed flush with additional (30ml) sterile water.    [provider]  Nutritional Supplements (PROSOURCE TF PO) Place 1 packet into feeding tube with breakfast, with lunch, and with evening meal. Use with the last portion of tube feeding; Give via feeding tube towards the end of tube feeding; flush with (60ml) sterile water; then give 1 packet of Prosource TF protein packet and flush with (60ml) sterile water. IF needed flush with additional (30ml) sterile water.  [provider]  ondansetron (ZOFRAN) 4 MG tablet Take 1 tablet (4 mg total) by mouth  every 8 (eight) hours as needed for nausea or vomiting. 07/03/23   Carlean Jews, NP  oxyCODONE (OXY IR/ROXICODONE) 5 MG immediate release tablet Place 1 tablet (5 mg total) into feeding tube every 6 (six) hours as needed for moderate pain (pain score 4-6). 07/23/23   Lonia Blood, MD  pantoprazole sodium (PROTONIX) 40 mg Place 40 mg into feeding tube daily. 09/11/23   [name], MD  PRESCRIPTION MEDICATION Apply 1 application  topically as needed (appy to feet and ankles). Sonafine Wound Dressing    [provider]  Water For Irrigation, Sterile (FREE WATER) SOLN Place 120 mLs into feeding tube 6 (six) times daily. 04/25/23   Azucena Fallen, MD     Critical care time: na        High MDM   Tessie Fass MSN, AGACNP-BC [facility] Pulmonary/Critical Care Medicine Amion for pager  09/15/2023, 5:21 PM

## 2023-09-15 NOTE — Consult Note (Signed)
 Samuel [name] is a very nice 78 year old white male.  He is incredibly interesting to talk to.  He is originally from [place].  He has his own company.  He had moved to [place].  He subsequently has been down in [place] for about 8 years.  He is followed by Dr. Mosetta Putt a localized esophageal cancer.  This is adenocarcinoma of the esophagus.  He presented with dysphagia and weight loss.  He subsequently was started on concurrent radiation and chemotherapy.  He received carboplatinum/Taxol.  Completed all this in late December.  He does have a feeding tube in.  He still was not able to swallow solids.  Dr. Mosetta Putt has noticed that he has had progressive thrombocytopenia since January.  He was hospitalized at the time with cellulitis and sepsis.  Dr. Mosetta Putt saw him 2 days ago.  She had lab work done on him.  At that time, his white count was 19,000.  Hemoglobin 13.3.  Platelet count was 24,000.  Of note, back in January his platelet count was 73,000.  He has been having melena.  He has not had any hemoptysis or hematemesis.  He has had some ecchymoses.  He came to the ER with melena.  His white count was 26.5.  Hemoglobin 12.4.  Platelet count was 12,000.  His white cell differential showed 86 segs 11 monocytes.  His chemistry studies today show sodium 137.  Potassium 4.4.  BUN 47 creatinine 0.84.  Calcium 9.3 with an albumin of 3.1.  His bilirubin is 0.7.  He had coagulation studies done today.  His INR was 9.2.  His PTT was greater than 200.Marland Kitchen  Fibrinogen greater than 800.  D-dimer was 18.9.  On 09/11/2023, the D-dimer was greater than 20.  Fibrinogen was 89.  INR was 2.2 with a PTT of 36.  It is hard to figure out what is going on with his coagulation system.  He does not have any obvious metastatic disease.  His liver should be in decent shape.  He has a adequate albumin.  He had a CT angiogram of the abdomen today.  This showed a small area of stranding in the left upper abdomen.  He had a  stable 1.1 x 1.6 cm nonenhancing hypoattenuating lesion in the pancreatic head.  Otherwise, nothing was really noted.  Again, this is a very challenging.  I did have a chance to look as peripheral blood smear.  He does have leukocytosis.  However, the vast majority of white blood cells are bands.  I saw a rare myelocyte.  He had no schistocytes.  There were no nucleated red blood cells.  I saw no target cells.  He had no rouleaux formation.  Platelets were decreased in number.  Platelets were large for the most part.  Platelets were well granulated.  It is, again, difficult to put everything together.  On his physical exam, his vital signs are temperature of 97.5.  Pulse 98.  Blood pressure is 112/85.  Oxygen saturation 95%.  Head and neck exam shows no oral lesions.  I do not see any ecchymoses or hematomas in the oral cavity.  He has no adenopathy in the neck.  Lungs are clear bilaterally.  Cardiac exam regular rate and rhythm.  I do not hear any murmurs.  Abdomen is soft.  He does have the feeding tube.  This appears to be intact.  His bowel sounds are decent.  There is no fluid wave.  There is no guarding or rebound tenderness.  There is no palpable liver or spleen tip.  Extremity shows some mild edema in the lower legs.  He has decent range of motion of his joints.  He has good strength bilaterally.  Skin exam does show some ecchymoses on his arms.  I really do not see much the way petechia.  Neurological exam is nonfocal.  I am still not sure as to why he has such abnormal coagulation parameters.  He had been on Eliquis.  This really should not affect the PT/PTT.  He swears that he is not doing any kind of supplements.  I think that given the fact that he is bleeding, I think that it would be reasonable to give him some FFP.   I probably would also give him a bag of platelets.  I think this will hopefully help with his bleeding.  I know that Gastroenterology has seen him.  They really are not able  to scope him because of his coagulation abnormalities.  Again this is very complicated.  I also find it very interesting that his fibrinogen is over 800.  Again I do not see any evidence of liver failure.  I would not think that he has any type of leukemic process (i.e. APL) that is well-known for DIC.  I would think that his blood smear would show me that.  We will have to follow along very closely.  Again he is incredibly delightful to talk to.  He has a very good attitude.  He definitely is motivated.   Christin Bach, MD  [name] 1:5

## 2023-09-15 NOTE — ED Triage Notes (Signed)
 Pt bib EMS from home. CO black tarry stools. EMS reports seeing black tarry stool. Has been off of eliquis for 2.5 days. Denies abdominal pain. Pt reports feeling hungry. Reports bloody stools on and off for a week.BP 120/70 Hr 90 96%RA CBG 118. Ambulates with walker. A&Ox4.

## 2023-09-15 NOTE — ED Notes (Signed)
 ED TO INPATIENT HANDOFF REPORT  Name/Age/Gender Samuel [name] 78 y.o. male  Code Status    Code Status Orders  (From admission, onward)           Start     Ordered   09/15/23 1630  Full code  Continuous       Question:  By:  Answer:  Consent: discussion documented in EHR   09/15/23 1630           Code Status History     Date Active Date Inactive Code Status Order ID Comments User Context   07/19/2023 0759 07/23/2023 2208 Full Code [id number]  Lorin Glass, MD ED   05/22/2023 2143 05/24/2023 2011 Full Code [id number]  Angie Fava, DO ED   04/14/2023 2012 04/25/2023 2242 Full Code [id number]  Nolberto Hanlon, MD ED       Home/SNF/Other Home  Chief Complaint Coagulopathy [facility]) [D68.9]  Level of Care/Admitting Diagnosis ED Disposition     ED Disposition  Admit   Condition  --   Comment  Hospital [facility]: [facility] [[id number]]  Level of Care: ICU [6]  May admit patient to [facility] or [facility] if equivalent level of care is available:: No  Covid Evaluation: Asymptomatic - no recent exposure (last 10 days) testing not required  Diagnosis: Coagulopathy [facility]) [[id number]]  Admitting Physician: Oretha Milch [[id number]]  Attending Physician: Oretha Milch [[id number]]  Certification:: I certify this patient will need inpatient services for at least 2 midnights  Expected Medical Readiness: 09/18/2023          Medical History Past Medical History:  Diagnosis Date   Diabetes mellitus without complication (HCC)    Esophageal cancer (HCC)    Pulmonary embolism (HCC)    In October 2024    Allergies Allergies  Allergen Reactions   Penicillins     Shortness of breath  Other Reaction(s): Not available   Sulfa Antibiotics     Blisters    Acetaminophen Other (See Comments)    Caused problems with urinary tract   Procaine Other (See Comments)    Caused fatigue    IV Location/Drains/Wounds Patient Lines/Drains/Airways Status     Active  Line/Drains/Airways     Name Placement date Placement time Site Days   Peripheral IV 09/15/23 20 G Anterior;Distal;Right;Upper Arm 09/15/23  1031  Arm  less than 1   Peripheral IV 09/15/23 20 G Posterior;Right Wrist 09/15/23  1630  Wrist  less than 1   Gastrostomy/Enterostomy Percutaneous endoscopic gastrostomy (PEG) 18 Fr. LUQ 05/23/23  2130  LUQ  115            Labs/Imaging Results for orders placed or performed during the hospital encounter of 09/15/23 (from the past 48 hours)  DIC Panel ONCE - STAT     Status: Abnormal   Collection Time: 09/15/23 10:03 AM  Result Value Ref Range   Prothrombin Time 75.0 (H) 11.4 - 15.2 seconds   INR 9.2 (HH) 0.8 - 1.2    Comment: CRITICAL RESULT CALLED TO, READ BACK BY AND VERIFIED WITH: RN [facility] AT 1230 09/15/23 CRUICKSHANK A (NOTE) INR goal varies based on device and disease states.    aPTT >200 (HH) 24 - 36 seconds    Comment:        IF BASELINE aPTT IS ELEVATED, SUGGEST PATIENT RISK ASSESSMENT BE USED TO DETERMINE APPROPRIATE ANTICOAGULANT THERAPY. CRITICAL RESULT CALLED TO, READ BACK BY AND VERIFIED WITH: RN [facility] AT 1230 09/15/23 CRUICKSHANK A  Fibrinogen >800 (H) 210 - 475 mg/dL   D-Dimer, Quant 45.40 (H) 0.00 - 0.50 ug/mL-FEU    Comment: (NOTE) At the manufacturer cut-off value of 0.5 g/mL FEU, this assay has a negative predictive value of 95-100%.This assay is intended for use in conjunction with a clinical pretest probability (PTP) assessment model to exclude pulmonary embolism (PE) and deep venous thrombosis (DVT) in outpatients suspected of PE or DVT. Results should be correlated with clinical presentation. CORRECTED ON 03/14 AT 1233: PREVIOUSLY REPORTED AS 18.85    Platelets 12 (LL) 150 - 400 K/uL    Comment: CRITICAL RESULT CALLED TO, READ BACK BY AND VERIFIED WITH: RN [facility] AT 1230 09/15/23 CRUICKSHANK A    Smear Review NO SCHISTOCYTES SEEN     Comment: Performed at [facility], [address] [place], [place] [address]  POC occult blood, ED     Status: Abnormal   Collection Time: 09/15/23 10:23 AM  Result Value Ref Range   Fecal Occult Bld POSITIVE (A) NEGATIVE  CBC with Differential     Status: Abnormal   Collection Time: 09/15/23 10:30 AM  Result Value Ref Range   WBC 26.5 (H) 4.0 - 10.5 K/uL   RBC 3.97 (L) 4.22 - 5.81 MIL/uL   Hemoglobin 12.4 (L) 13.0 - 17.0 g/dL   HCT 14.7 82.9 - 56.2 %   MCV 99.2 80.0 - 100.0 fL   MCH 31.2 26.0 - 34.0 pg   MCHC 31.5 30.0 - 36.0 g/dL   RDW 13.0 86.5 - 78.4 %   Platelets 12 (LL) 150 - 400 K/uL    Comment: SPECIMEN CHECKED FOR CLOTS Immature Platelet Fraction may be clinically indicated, consider ordering this additional test [id number] REPEATED TO VERIFY PLATELET COUNT CONFIRMED BY SMEAR CRITICAL RESULT CALLED TO, READ BACK BY AND VERIFIED WITH: RN [facility] AT 1230 09/15/23 CRUICKSHANK A    nRBC 0.5 (H) 0.0 - 0.2 %   Neutrophils Relative % 86 %   Neutro Abs 23.1 (H) 1.7 - 7.7 K/uL   Band Neutrophils 1 %   Lymphocytes Relative 0 %   Lymphs Abs 0.0 (L) 0.7 - 4.0 K/uL   Monocytes Relative 11 %   Monocytes Absolute 2.9 (H) 0.1 - 1.0 K/uL   Eosinophils Relative 0 %   Eosinophils Absolute 0.0 0.0 - 0.5 K/uL   Basophils Relative 0 %   Basophils Absolute 0.0 0.0 - 0.1 K/uL   WBC Morphology Mild Left Shift (1-5% metas, occ myelo)    RBC Morphology MORPHOLOGY UNREMARKABLE    Myelocytes 2 %   Abs Immature Granulocytes 0.50 (H) 0.00 - 0.07 K/uL    Comment: Performed at [facility], [address], [place], [place] [address]  Comprehensive metabolic panel     Status: Abnormal   Collection Time: 09/15/23 10:30 AM  Result Value Ref Range   Sodium 137 135 - 145 mmol/L   Potassium 4.4 3.5 - 5.1 mmol/L   Chloride 105 98 - 111 mmol/L   CO2 24 22 - 32 mmol/L   Glucose, Bld 106 (H) 70 - 99 mg/dL    Comment: Glucose reference range applies only to samples taken after fasting for at least 8 hours.   BUN 47 (H) 8 -  23 mg/dL   Creatinine, Ser 4.01 0.61 - 1.24 mg/dL   Calcium 9.3 8.9 - 02.7 mg/dL   Total Protein 5.5 (L) 6.5 - 8.1 g/dL   Albumin 3.1 (L) 3.5 - 5.0 g/dL   AST  46 (H) 15 - 41 U/L   ALT 31 0 - 44 U/L   Alkaline Phosphatase 78 38 - 126 U/L   Total Bilirubin 0.7 0.0 - 1.2 mg/dL   GFR, Estimated >44 >01 mL/min    Comment: (NOTE) Calculated using the CKD-EPI Creatinine Equation (2021)    Anion gap 8 5 - 15    Comment: Performed at [facility], [address] [place], [place] [address]  Type and screen [facility]     Status: None   Collection Time: 09/15/23 10:30 AM  Result Value Ref Range   ABO/RH(D) O POS    Antibody Screen NEG    Sample Expiration      09/18/2023,2359 Performed at [facility], [address] [place], [place] [address]   ABO/Rh     Status: None   Collection Time: 09/15/23 12:00 PM  Result Value Ref Range   ABO/RH(D)      O POS Performed at [facility], [address], [place], [place] [address]   Urinalysis, Routine w reflex microscopic -Urine, Clean Catch     Status: None   Collection Time: 09/15/23 12:15 PM  Result Value Ref Range   Color, Urine YELLOW YELLOW   APPearance CLEAR CLEAR   Specific Gravity, Urine 1.019 1.005 - 1.030   pH 5.0 5.0 - 8.0   Glucose, UA NEGATIVE NEGATIVE mg/dL   Hgb urine dipstick NEGATIVE NEGATIVE   Bilirubin Urine NEGATIVE NEGATIVE   Ketones, ur NEGATIVE NEGATIVE mg/dL   Protein, ur NEGATIVE NEGATIVE mg/dL   Nitrite NEGATIVE NEGATIVE   Leukocytes,Ua NEGATIVE NEGATIVE    Comment: Performed at [facility], [address] [place], [place] [address]  Prepare platelet pheresis     Status: None (Preliminary result)   Collection Time: 09/15/23 12:16 PM  Result Value Ref Range   Unit Number [id number]    Blood Component Type PSORALEN TREATED    Unit division 00    Status of Unit ISSUED    Transfusion Status      OK TO  TRANSFUSE Performed at [facility], 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 Black tarry stool. History of esophageal cancer. * Tracking Code: BO * EXAM: CTA ABDOMEN AND PELVIS WITHOUT AND WITH CONTRAST TECHNIQUE: Multidetector CT imaging of the abdomen and pelvis was performed using the standard protocol during bolus administration of intravenous contrast. Multiplanar reconstructed images and MIPs were obtained and reviewed to evaluate the vascular anatomy. RADIATION DOSE REDUCTION: This exam was performed according to the departmental dose-optimization program which includes automated exposure control, adjustment of the mA and/or kV according to patient size and/or use of iterative reconstruction technique. CONTRAST:  OMNIPAQUE IOHEXOL 350 MG/ML SOLN COMPARISON:  CT scan abdomen and pelvis from 04/19/2023. FINDINGS: VASCULAR Aorta: Normal caliber aorta without aneurysm, dissection, vasculitis or significant stenosis. Celiac: Patent without evidence of aneurysm, dissection, vasculitis or significant stenosis. SMA: Patent without evidence of aneurysm, dissection, vasculitis or significant stenosis. Note is made of small area of a fat stranding in the left upper abdomen mesentery (series 6, images 55-58), which contains several 1-2 mm diameter sized vessels within. The area is supplied by the SMA/SMV branches (jejunal and ileal branches). This is new since the prior CT scan from 04/19/2023. No associated adjacent abnormal bowel wall thickening or focal mass. 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 However, this is most likely not a cause of patient's GI bleeding. Renals: Both renal arteries are patent without evidence of aneurysm, dissection, vasculitis, fibromuscular dysplasia or significant stenosis. IMA: Patent without evidence of  aneurysm, dissection, vasculitis or significant stenosis. Inflow: There is ectasia of the left internal iliac artery measuring up to 9 mm in diameter. The inflow arteries are otherwise patent without evidence of aneurysm, dissection, vasculitis or significant stenosis. Proximal Outflow: Bilateral common femoral and visualized portions of the superficial and profunda femoral arteries are patent without evidence of aneurysm, dissection, vasculitis or significant stenosis. Veins: No obvious venous abnormality within the limitations of this arterial phase study. Review of the MIP images confirms the above findings. NON-VASCULAR Lower chest: There is partially imaged at least trace left pleural effusion with associated pleural thickening. The lung bases are otherwise clear. No left pleural effusion. The heart is normal in size. No pericardial effusion. Hepatobiliary: The liver is normal in size. Non-cirrhotic configuration. No suspicious mass. No intrahepatic or extrahepatic bile duct dilation. Redemonstration of an approximately 5 mm calcification along the dependent wall of the gallbladder at the fold, which may represent adherent gallstone versus focal gallbladder wall calcification. No significant interval change since the prior study. No imaging evidence of acute cholecystitis. Otherwise normal gallbladder wall. No pericholecystic inflammatory changes. Pancreas: Redemonstration of an approximately 1.1 x 1.6 cm nonenhancing hypoattenuating lesion adjacent to the pancreatic head. The lesion is essentially unchanged since the prior study. No main pancreatic duct dilation. 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 Further evaluation with MRI abdomen as per pancreatic mass protocol is recommended, unless recently performed. Otherwise unremarkable pancreas. No peripancreatic fat stranding. Spleen: Within normal limits. No focal lesion. Adrenals/Urinary Tract:  Adrenal glands are unremarkable. No suspicious renal mass. Redemonstration of bilobed predominantly exophytic cyst arising from the left kidney, anteriorly measuring 5.7 x 8.2 cm. No nephroureterolithiasis or obstructive uropathy on either side. Unremarkable urinary bladder. Stomach/Bowel: Note is made of midline gastrostomy tube. No disproportionate dilation of the small or large bowel loops. No evidence of abnormal bowel wall thickening or inflammatory changes. The appendix is unremarkable. There are multiple diverticula mainly in the left hemi colon, without imaging signs of diverticulitis. Vascular/Lymphatic: No ascites or pneumoperitoneum. No abdominal or pelvic lymphadenopathy, by size criteria. No aneurysmal dilation of the major abdominal arteries. There are mild peripheral atherosclerotic vascular calcifications of the aorta and its major branches. Reproductive: Enlarged prostate. Symmetric seminal vesicles. Other: There is a tiny fat containing umbilical hernia. The soft tissues and abdominal wall are otherwise unremarkable. Musculoskeletal: No suspicious osseous lesions. There are mild - moderate multilevel degenerative changes in the visualized spine. IMPRESSION: VASCULAR *There is a small area of fat stranding in the left upper abdomen, which contains several 1-2 mm sized vessels within. 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 NON-VASCULAR *No acute inflammatory process identified within the abdomen or pelvis. *There is a stable, approximately 1.1 x 1.6 cm nonenhancing hypoattenuating lesion adjacent to the pancreatic head. 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 Further evaluation with MRI abdomen as per pancreatic mass protocol is recommended, unless recently performed. *Both multiple other nonacute observations, as described above. Aortic Atherosclerosis (ICD10-I70.0).  Bone Electronically Signed   By: Jules Schick M.D.   On: 09/15/2023 15:13    Pending Labs Unresulted Labs (From admission, onward)  Start     Ordered   09/15/23 1659  DIC Panel ONCE - STAT  ONCE - STAT,   STAT       Placed in "And" Linked Group   09/15/23 1659   09/15/23 1659  Immature Platelet Fraction  ONCE - URGENT,   URGENT       Placed in "And" Linked Group   09/15/23 1659   09/15/23 1536  CBC with Differential  Once,   STAT        09/15/23 1546   09/15/23 1536  Blood culture (routine x 2)  BLOOD CULTURE X 2,   R (with STAT occurrences)      09/15/23 1546            Vitals/Pain Today's Vitals   09/15/23 1315 09/15/23 1322 09/15/23 1330 09/15/23 1348  BP: (!) 116/56  109/70 112/85  Pulse: 92  87 98  Resp: 19  19 (!) 21  Temp: (!) 97.5 F (36.4 C)  97.7 F (36.5 C) (!) 97.5 F (36.4 C)  TempSrc: Oral     SpO2: 98%  96% 95%  Weight:      Height:      PainSc:  0-No pain      Isolation Precautions No active isolations  Medications Medications  metroNIDAZOLE (FLAGYL) IVPB 500 mg (500 mg Intravenous New Bag/Given 09/15/23 1644)  vancomycin (VANCOCIN) IVPB 1000 mg/200 mL premix (1,000 mg Intravenous New Bag/Given 09/15/23 1655)  docusate sodium (COLACE) capsule 100 mg (has no administration in time range)  polyethylene glycol (MIRALAX / GLYCOLAX) packet 17 g (has no administration in time range)  pantoprazole (PROTONIX) injection 40 mg (has no administration in time range)  dexamethasone (DECADRON) tablet 10 mg (has no administration in time range)  sodium chloride 0.9 % bolus 1,000 mL (0 mLs Intravenous Stopped 09/15/23 1228)  pantoprazole (PROTONIX) injection 40 mg (40 mg Intravenous Given 09/15/23 1134)  iohexol (OMNIPAQUE) 350 MG/ML injection 100 mL (100 mLs Intravenous Contrast Given 09/15/23 1219)  0.9 %  sodium chloride infusion (Manually program via Guardrails IV Fluids) (0 mLs Intravenous Stopped 09/15/23 1348)  phytonadione (VITAMIN K) 1 mg in dextrose 5 % 50 mL IVPB (0 mg Intravenous Stopped  09/15/23 1445)  ceFEPIme (MAXIPIME) 2 g in sodium chloride 0.9 % 100 mL IVPB (0 g Intravenous Stopped 09/15/23 1702)    Mobility walks with device

## 2023-09-15 NOTE — Progress Notes (Addendum)
 eLink Physician-Brief Progress Note Patient Name: Samuel [name] DOB: 10/11/1945 MRN: [id number]   Date of Service  09/15/2023  HPI/Events of Note  78 year old man with esophageal cancer status post chemo and radiation with good response on PET scan admitted with melena for 2 days thought to be in DIC versus ITP.   He reports significant leg cramps requesting medications-has a history of oxycodone in the past but not taking recently.  eICU Interventions  Maintain n.p.o., utilize PEG as needed for meds  Add oxycodone through PEG tube as needed.  Check calcium and magnesium levels for cramping.   2244 -patient reports itching with oxycodone but still wants to take it.  Want something to help him rest.  Supposedly Benadryl and melatonin have the opposite effect on him.  Reviewed posttransfusion labs-hemoglobin stable, DIC panel unremarkable.  EKG okay add trazodone as needed  0506 -nausea/vomiting-> Zofran  Intervention Category Intermediate Interventions: Pain - evaluation and management  Samuel [name] 09/15/2023, 8:14 PM

## 2023-09-15 NOTE — ED Provider Notes (Signed)
 [place] EMERGENCY DEPARTMENT AT [facility] Provider Note   CSN: [id number] Arrival date & time: 09/15/23  0940     History  Chief Complaint  Patient presents with   GI Bleeding    Samuel [name] is a 78 y.o. male with past medical history significant for diabetes, esophageal cancer, pulmonary embolism currently with coagulopathy, low platelets who presents with concern for acute GI bleed, with some bright red blood a few days ago but since then with dark black stool, he stopped taking his Eliquis prescription to to have days ago due to concern for bleeding.  HPI     Home Medications Prior to Admission medications   Medication Sig Start Date End Date Taking? Authorizing Provider  acetaminophen (TYLENOL) 160 MG/5ML solution Place 20.3 mLs (650 mg total) into feeding tube every 6 (six) hours as needed for mild pain (pain score 1-3), headache or fever. 07/23/23   Lonia Blood, MD  dexamethasone (DECADRON) 4 MG tablet Take 1 tablet (4 mg total) by mouth 2 (two) times daily with a meal. 09/11/23   [name], MD  hydrocortisone cream 0.5 % Apply 1 Application topically 2 (two) times daily. 08/16/23   [name], MD  lactulose (CHRONULAC) 10 GM/15ML solution Place 30 mLs (20 g total) into feeding tube 2 (two) times daily as needed for moderate constipation. 07/23/23   Lonia Blood, MD  Nutritional Supplements (OSMOLITE 1.5 CAL PO) Place 474 mLs into feeding tube 3 (three) times daily. Give via feeding tube towards the end of tube feeding; flush with (60ml) sterile water; then give 1 packet of Prosource TF protein packet and flush with (60ml) sterile water. IF needed flush with additional (30ml) sterile water.    [provider]  Nutritional Supplements (PROSOURCE TF PO) Place 1 packet into feeding tube with breakfast, with lunch, and with evening meal. Use with the last portion of tube feeding; Give via feeding tube towards the end of tube  feeding; flush with (60ml) sterile water; then give 1 packet of Prosource TF protein packet and flush with (60ml) sterile water. IF needed flush with additional (30ml) sterile water.    [provider]  ondansetron (ZOFRAN) 4 MG tablet Take 1 tablet (4 mg total) by mouth every 8 (eight) hours as needed for nausea or vomiting. 07/03/23   Carlean Jews, NP  oxyCODONE (OXY IR/ROXICODONE) 5 MG immediate release tablet Place 1 tablet (5 mg total) into feeding tube every 6 (six) hours as needed for moderate pain (pain score 4-6). 07/23/23   Lonia Blood, MD  pantoprazole sodium (PROTONIX) 40 mg Place 40 mg into feeding tube daily. 09/11/23   [name], MD  PRESCRIPTION MEDICATION Apply 1 application  topically as needed (appy to feet and ankles). Sonafine Wound Dressing    [provider]  Water For Irrigation, Sterile (FREE WATER) SOLN Place 120 mLs into feeding tube 6 (six) times daily. 04/25/23   Azucena Fallen, MD      Allergies    Penicillins, Sulfa antibiotics, Acetaminophen, and Procaine    Review of Systems   Review of Systems  All other systems reviewed and are negative.   Physical Exam Updated Vital Signs BP 112/85   Pulse 98   Temp (!) 97.5 F (36.4 C)   Resp (!) 21   Ht 5\' 6"  (1.676 m)   Wt 73 kg   SpO2 95%   BMI 25.98 kg/m  Physical Exam Vitals and nursing  note reviewed.  Constitutional:      General: He is not in acute distress.    Appearance: Normal appearance. He is ill-appearing.  HENT:     Head: Normocephalic and atraumatic.  Eyes:     General:        Right eye: No discharge.        Left eye: No discharge.  Cardiovascular:     Rate and Rhythm: Normal rate and regular rhythm.     Heart sounds: No murmur heard.    No friction rub. No gallop.  Pulmonary:     Effort: Pulmonary effort is normal.     Breath sounds: Normal breath sounds.  Abdominal:     General: Bowel sounds are normal.     Palpations: Abdomen is soft.      Comments: G tube in mid abdomen, diffuse ttp to palpation throughout, no rebound, rigidity, guarding. Most focally ttp in llq  Skin:    General: Skin is warm and dry.     Capillary Refill: Capillary refill takes less than 2 seconds.     Comments: Bruising of exposed skin tissues throughout, diffuse, scattered  Neurological:     Mental Status: He is alert and oriented to person, place, and time.  Psychiatric:        Mood and Affect: Mood normal.        Behavior: Behavior normal.     ED Results / Procedures / Treatments   Labs (all labs ordered are listed, but only abnormal results are displayed) Labs Reviewed  CBC WITH DIFFERENTIAL/PLATELET - Abnormal; Notable for the following components:      Result Value   WBC 26.5 (*)    RBC 3.97 (*)    Hemoglobin 12.4 (*)    Platelets 12 (*)    nRBC 0.5 (*)    Neutro Abs 23.1 (*)    Lymphs Abs 0.0 (*)    Monocytes Absolute 2.9 (*)    Abs Immature Granulocytes 0.50 (*)    All other components within normal limits  COMPREHENSIVE METABOLIC PANEL - Abnormal; Notable for the following components:   Glucose, Bld 106 (*)    BUN 47 (*)    Total Protein 5.5 (*)    Albumin 3.1 (*)    AST 46 (*)    All other components within normal limits  DIC (DISSEMINATED INTRAVASCULAR COAGULATION)PANEL - Abnormal; Notable for the following components:   Prothrombin Time 75.0 (*)    INR 9.2 (*)    aPTT >200 (*)    Fibrinogen >800 (*)    D-Dimer, Quant 18.85 (*)    Platelets 12 (*)    All other components within normal limits  POC OCCULT BLOOD, ED - Abnormal; Notable for the following components:   Fecal Occult Bld POSITIVE (*)    All other components within normal limits  URINALYSIS, ROUTINE W REFLEX MICROSCOPIC  TYPE AND SCREEN  ABO/RH  PREPARE PLATELET PHERESIS    EKG None  Radiology No results found.  Procedures .Critical Care  Performed by: Olene Floss, PA-C Authorized by: Olene Floss, PA-C   Critical care provider  statement:    Critical care time (minutes):  35   Critical care was necessary to treat or prevent imminent or life-threatening deterioration of the following conditions:  Circulatory failure   Critical care was time spent personally by me on the following activities:  Development of treatment plan with patient or surrogate, discussions with consultants, evaluation of patient's response to treatment, examination of  patient, ordering and review of laboratory studies, ordering and review of radiographic studies, ordering and performing treatments and interventions, pulse oximetry, re-evaluation of patient's condition and review of old charts   Care discussed with: admitting provider       Medications Ordered in ED Medications  sodium chloride 0.9 % bolus 1,000 mL (0 mLs Intravenous Stopped 09/15/23 1228)  pantoprazole (PROTONIX) injection 40 mg (40 mg Intravenous Given 09/15/23 1134)  iohexol (OMNIPAQUE) 350 MG/ML injection 100 mL (100 mLs Intravenous Contrast Given 09/15/23 1219)  0.9 %  sodium chloride infusion (Manually program via Guardrails IV Fluids) (0 mLs Intravenous Stopped 09/15/23 1348)  phytonadione (VITAMIN K) 1 mg in dextrose 5 % 50 mL IVPB (1 mg Intravenous New Bag/Given 09/15/23 1345)    ED Course/ Medical Decision Making/ A&P Clinical Course as of 09/15/23 1508  Fri Sep 15, 2023  1441 GI to consult, Loreta Ave [CP]  1500 Likely admission. Esophageal cancer, Tx finished in 07/2023. Clear of mets, but 1 month Hx of thrombocytopenia. Has GI bleed x4-5 days with melena. GI consulted and will see him. Approved for platelet infusion but has not had it. Waiting on CT but will admit. Platelets, protonix, and vitamin K given.  [CB]    Clinical Course User Index [CB] Lunette Stands, PA-C [CP] Montel Clock Harrel Carina, PA-C                                 Medical Decision Making Amount and/or Complexity of Data Reviewed Labs: ordered. Radiology: ordered.  Risk Prescription drug  management.   This patient is a 78 y.o. male who presents to the ED for concern of gi bleed, this involves an extensive number of treatment options, and is a complaint that carries with it a high risk of complications and morbidity. The emergent differential diagnosis prior to evaluation includes, but is not limited to,  active lower gi vs upper gi bleed, active hemodynamic instability, vs other acute intraabdominal process . This is not an exhaustive differential.   Past Medical History / Co-morbidities / Social History: diabetes, esophageal cancer, pulmonary embolism  Additional history: Chart reviewed. Pertinent results include: Reviewed significant outside records including recent oncology visits, patient has been having a coagulopathy recently with significantly decreased platelets, severe leukocytosis, especially get an infusion to manage his low platelets sometime soon.  Physical Exam: Physical exam performed. The pertinent findings include: Dark black stool in rectal vault, no bright red blood.  Diffusely tender on the abdomen, most focally in the left lower quadrant, G-tube in place appears appropriately housed.  He is chronically ill-appearing but does not appear toxic, or septic  Lab Tests: I ordered, and personally interpreted labs.  The pertinent results include: UA unremarkable, CMP with elevated BUN at 47 likely from active GI bleed, he has total protein calorie malnutrition, protein 5.5, albumin 3.1, very mildly elevated AST at 46.  His CBC and DIC panel are severely abnormal, white blood cell count 26.5, he has hemoglobin of 12.4, platelets are critical at 12 down from around 26 recently, his Hemoccult is positive, his PTT is 75, INR 9.2, APTT greater than 200, greater than 800 fibrinogen, D-dimer of 18.85 but with no schistocytes seen.   Imaging Studies: I ordered imaging studies including CT angio GI bleed.  Cardiac Monitoring:  The patient was maintained on a cardiac monitor.   My attending physician Dr. Andria Meuse viewed and interpreted the cardiac monitored which showed  an underlying rhythm of: NSR, RBBB. I agree with this interpretation.   Medications: I ordered medication including transfuse platelets, administered vitamin K, and given Protonix for upper GI bleed, critical thrombocytopenia  Consultations Obtained: I requested consultation with the GI doctor, spoke with Dr. Loreta Ave,  and discussed lab and imaging findings as well as pertinent plan - they recommend: They agree with his current management, they will consult on him while he is admitted for EGD once platelets stabilize, and ongoing management of his upper GI bleed.  Pending hospitalist consult this time.   Disposition: After consideration of the diagnostic results and the patients response to treatment, I feel that patient will need admission for his worsening thrombocytopenia, active GI bleed, vitamin K administered, platelets administered, and protonix given for GI bleed.  I discussed this case with my attending physician Dr. Andria Meuse who cosigned this note including patient's presenting symptoms, physical exam, and planned diagnostics and interventions. Attending physician stated agreement with plan or made changes to plan which were implemented.    Final Clinical Impression(s) / ED Diagnoses Final diagnoses:  Thrombocytopenia (HCC)  Acute ITP (HCC)  Acute GI bleeding    Rx / DC Orders ED Discharge Orders     None         Olene Floss, PA-C 09/15/23 1508    Anders Simmonds T, DO 09/15/23 2001

## 2023-09-15 NOTE — Consult Note (Signed)
 Reason for Consult: Melena, and esophageal cancer Referring Physician: [facility]  Samuel [name] HPI: This is a 78 year old male with a PMH of esophageal cancer diagnosed on 04/18/2023 with Dr. Lorenso Quarry as an unassigned inpatient, PE on Eliquis (04/14/2023), and DM admitted for complaints of melena.  He reports that his bleeding started 3-4 days ago and he reports a total of 5-6 melenic bowel movements.  The patient does not have any symptoms of nausea, vomiting, or abdominal pain.  He receives nutrition through a PEG tube and he does not report any bleeding from the PEG site.  With his persistent symptoms of melena he presented to the ER.  The CTA was negative for any overt bleeding source.  Since January, with treatment for his esophageal cancer, his platelets declined significantly.  His admission plt was at 12 and Dr. Mosetta Putt also noted that he had DIC.  The patient was not deemed a surgical candidate and he was treated with chemotherapy an XRT.  Past Medical History:  Diagnosis Date   Diabetes mellitus without complication (HCC)    Esophageal cancer [facility])    Pulmonary embolism (HCC)    In October 2024    Past Surgical History:  Procedure Laterality Date   BIOPSY  04/18/2023   Procedure: BIOPSY;  Surgeon: Lynann Bologna, DO;  Location: [facility] ENDOSCOPY;  Service: Gastroenterology;;   ESOPHAGOGASTRODUODENOSCOPY N/A 04/18/2023   Procedure: ESOPHAGOGASTRODUODENOSCOPY (EGD);  Surgeon: Lynann Bologna, DO;  Location: [facility] ENDOSCOPY;  Service: Gastroenterology;  Laterality: N/A;   IR GASTROSTOMY TUBE MOD SED  04/20/2023   IR NASO G TUBE PLC W/FL W/RAD  04/20/2023   KNEE ARTHROPLASTY Bilateral     Family History  Problem Relation Age of Onset   Hypertension Father    COPD Father    COPD Sister    Diabetes Maternal Grandfather     Social History:  reports that he quit smoking about 38 years ago. His smoking use included cigarettes. He started smoking about 53 years ago. He has  a 15 pack-year smoking history. He has never used smokeless tobacco. He reports current alcohol use of about 1.0 - 2.0 standard drink of alcohol per week. He reports that he does not use drugs.  Allergies:  Allergies  Allergen Reactions   Penicillins     Shortness of breath   Sulfa Antibiotics     Blisters    Acetaminophen Other (See Comments)    Caused problems with urinary tract   Procaine Other (See Comments)    Caused fatigue    Medications: Scheduled: Continuous:  Results for orders placed or performed during the hospital encounter of 09/15/23 (from the past 24 hours)  DIC Panel ONCE - STAT     Status: Abnormal   Collection Time: 09/15/23 10:03 AM  Result Value Ref Range   Prothrombin Time 75.0 (H) 11.4 - 15.2 seconds   INR 9.2 (HH) 0.8 - 1.2   aPTT >200 (HH) 24 - 36 seconds   Fibrinogen >800 (H) 210 - 475 mg/dL   D-Dimer, Quant 16.10 (H) 0.00 - 0.50 ug/mL-FEU   Platelets 12 (LL) 150 - 400 K/uL   Smear Review NO SCHISTOCYTES SEEN   POC occult blood, ED     Status: Abnormal   Collection Time: 09/15/23 10:23 AM  Result Value Ref Range   Fecal Occult Bld POSITIVE (A) NEGATIVE  CBC with Differential     Status: Abnormal   Collection Time: 09/15/23 10:30 AM  Result Value Ref Range  WBC 26.5 (H) 4.0 - 10.5 K/uL   RBC 3.97 (L) 4.22 - 5.81 MIL/uL   Hemoglobin 12.4 (L) 13.0 - 17.0 g/dL   HCT 16.1 09.6 - 04.5 %   MCV 99.2 80.0 - 100.0 fL   MCH 31.2 26.0 - 34.0 pg   MCHC 31.5 30.0 - 36.0 g/dL   RDW 40.9 81.1 - 91.4 %   Platelets 12 (LL) 150 - 400 K/uL   nRBC 0.5 (H) 0.0 - 0.2 %   Neutrophils Relative % 86 %   Neutro Abs 23.1 (H) 1.7 - 7.7 K/uL   Band Neutrophils 1 %   Lymphocytes Relative 0 %   Lymphs Abs 0.0 (L) 0.7 - 4.0 K/uL   Monocytes Relative 11 %   Monocytes Absolute 2.9 (H) 0.1 - 1.0 K/uL   Eosinophils Relative 0 %   Eosinophils Absolute 0.0 0.0 - 0.5 K/uL   Basophils Relative 0 %   Basophils Absolute 0.0 0.0 - 0.1 K/uL   WBC Morphology Mild Left Shift  (1-5% metas, occ myelo)    RBC Morphology MORPHOLOGY UNREMARKABLE    Myelocytes 2 %   Abs Immature Granulocytes 0.50 (H) 0.00 - 0.07 K/uL  Comprehensive metabolic panel     Status: Abnormal   Collection Time: 09/15/23 10:30 AM  Result Value Ref Range   Sodium 137 135 - 145 mmol/L   Potassium 4.4 3.5 - 5.1 mmol/L   Chloride 105 98 - 111 mmol/L   CO2 24 22 - 32 mmol/L   Glucose, Bld 106 (H) 70 - 99 mg/dL   BUN 47 (H) 8 - 23 mg/dL   Creatinine, Ser 7.82 0.61 - 1.24 mg/dL   Calcium 9.3 8.9 - 95.6 mg/dL   Total Protein 5.5 (L) 6.5 - 8.1 g/dL   Albumin 3.1 (L) 3.5 - 5.0 g/dL   AST 46 (H) 15 - 41 U/L   ALT 31 0 - 44 U/L   Alkaline Phosphatase 78 38 - 126 U/L   Total Bilirubin 0.7 0.0 - 1.2 mg/dL   GFR, Estimated >21 >30 mL/min   Anion gap 8 5 - 15  Type and screen [facility]     Status: None   Collection Time: 09/15/23 10:30 AM  Result Value Ref Range   ABO/RH(D) O POS    Antibody Screen NEG    Sample Expiration      09/18/2023,2359 Performed at [facility], [address] [place], [place] [address]   ABO/Rh     Status: None   Collection Time: 09/15/23 12:00 PM  Result Value Ref Range   ABO/RH(D)      O POS Performed at [facility], [address] [place], [place] [address]   Urinalysis, Routine w reflex microscopic -Urine, Clean Catch     Status: None   Collection Time: 09/15/23 12:15 PM  Result Value Ref Range   Color, Urine YELLOW YELLOW   APPearance CLEAR CLEAR   Specific Gravity, Urine 1.019 1.005 - 1.030   pH 5.0 5.0 - 8.0   Glucose, UA NEGATIVE NEGATIVE mg/dL   Hgb urine dipstick NEGATIVE NEGATIVE   Bilirubin Urine NEGATIVE NEGATIVE   Ketones, ur NEGATIVE NEGATIVE mg/dL   Protein, ur NEGATIVE NEGATIVE mg/dL   Nitrite NEGATIVE NEGATIVE   Leukocytes,Ua NEGATIVE NEGATIVE  Prepare platelet pheresis     Status: None (Preliminary result)   Collection Time: 09/15/23 12:16 PM  Result Value Ref Range   Unit  Number [id number]  Blood Component Type PSORALEN TREATED    Unit division 00    Status of Unit ISSUED    Transfusion Status      OK TO TRANSFUSE Performed at [facility], [address] [place], [place] [address]      No results found.  ROS:  As stated above in the HPI otherwise negative.  Blood pressure 112/85, pulse 98, temperature (!) 97.5 F (36.4 C), resp. rate (!) 21, height 5\' 6"  (1.676 m), weight 73 kg, SpO2 95%.    PE: Gen: NAD, Alert and Oriented HEENT:  [place]/AT, EOMI Neck: Supple, no LAD Lungs: CTA Bilaterally CV: RRR without M/G/R ABD: Soft, NTND, +BS Ext: No C/C/E  Assessment/Plan: 1) Melena. 2) DIC - D-Dimer 18.85, Fibrinogen >800, INR 9.2, PTT >200, and Plt 12. 3) Esophageal cancer - ? Stage. 4) Thrombocytopenia.   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  This is believed to be the source of his melena.  With his significant coagulopathy GI intervention is contraindicated.  Additionally, the CTA did not identify a discrete source of bleeding.  Plan: 1) Agree with Vitamin K. 2) Agree with platelet transfusion. 3) Transfuse as necessary. 4) Dr. Marca Ancona will round this weekend.  Muslima Toppins D 09/15/2023, 3:01 PM

## 2023-09-15 NOTE — ED Provider Notes (Signed)
  Physical Exam  BP 112/85   Pulse 98   Temp (!) 97.5 F (36.4 C)   Resp (!) 21   Ht 5\' 6"  (1.676 m)   Wt 73 kg   SpO2 95%   BMI 25.98 kg/m   Physical Exam Constitutional:      General: He is not in acute distress.    Appearance: He is ill-appearing.  Cardiovascular:     Rate and Rhythm: Normal rate and regular rhythm.  Pulmonary:     Effort: Pulmonary effort is normal. No respiratory distress.  Neurological:     Mental Status: He is alert.     Procedures  .Critical Care  Performed by: Lunette Stands, PA-C Authorized by: Lunette Stands, PA-C   Critical care provider statement:    Critical care time (minutes):  56   Critical care time was exclusive of:  Separately billable procedures and treating other patients   Critical care was necessary to treat or prevent imminent or life-threatening deterioration of the following conditions:  Circulatory failure and metabolic crisis   Critical care was time spent personally by me on the following activities:  Development of treatment plan with patient or surrogate, discussions with consultants, evaluation of patient's response to treatment, examination of patient, ordering and review of laboratory studies, ordering and review of radiographic studies, ordering and performing treatments and interventions, pulse oximetry, re-evaluation of patient's condition, review of old charts and obtaining history from patient or surrogate   I assumed direction of critical care for this patient from another provider in my specialty: yes     Care discussed with: admitting provider     ED Course / MDM   Clinical Course as of 09/15/23 1635  Fri Sep 15, 2023  1441 GI to consult, Loreta Ave [CP]  1500 Likely admission. Esophageal cancer, Tx finished in 07/2023. Clear of mets, but 1 month Hx of thrombocytopenia. Has GI bleed x4-5 days with melena. GI consulted and will see him. Approved for platelet infusion but has not had it. Waiting on CT but will admit.  Platelets, protonix, and vitamin K given.  [CB]    Clinical Course User Index [CB] Lunette Stands, PA-C [CP] Montel Clock Harrel Carina, PA-C   Medical Decision Making Amount and/or Complexity of Data Reviewed Labs: ordered. Radiology: ordered.  Risk Prescription drug management. Decision regarding hospitalization.   Patient care was handed off from [employer].  At time handoff was anticipating admission probably through ICU as patient likely in DIC with DIC panel entirely abnormal.  Patient was noted to have an esophageal cancer history with last seen by Dr. Mosetta Putt on 09/13/2023.  Noted to have a platelet transfusion pending but had not yet received.  Had been given platelets, vitamin K, fluids, Protonix here in the ED by previous team.  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  GI had already been consulted at this point.  Consulted with Dr. Myna Hidalgo with oncology who said that he would lay eyes in the patient and did not think that an IVIG was warranted at this time but would want a repeat DIC panel and CBC.  Contacted Dr. Mosetta Putt however was unavailable at the time.  Dr. Vassie Loll was then consulted with ICU for ICU admission as patient will require most likely further close monitoring.     Lunette Stands, [place] 09/15/23 1651    Wynetta Fines, MD 09/17/23 1000

## 2023-09-16 DIAGNOSIS — D696 Thrombocytopenia, unspecified: Secondary | ICD-10-CM | POA: Diagnosis not present

## 2023-09-16 DIAGNOSIS — D689 Coagulation defect, unspecified: Secondary | ICD-10-CM | POA: Diagnosis not present

## 2023-09-16 DIAGNOSIS — K922 Gastrointestinal hemorrhage, unspecified: Secondary | ICD-10-CM | POA: Diagnosis not present

## 2023-09-16 LAB — CBC WITH DIFFERENTIAL/PLATELET
Abs Immature Granulocytes: 0.3 10*3/uL — ABNORMAL HIGH (ref 0.00–0.07)
Basophils Absolute: 0 10*3/uL (ref 0.0–0.1)
Basophils Relative: 0 %
Eosinophils Absolute: 0.1 10*3/uL (ref 0.0–0.5)
Eosinophils Relative: 1 %
HCT: 29.7 % — ABNORMAL LOW (ref 39.0–52.0)
Hemoglobin: 9.4 g/dL — ABNORMAL LOW (ref 13.0–17.0)
Lymphocytes Relative: 0 %
Lymphs Abs: 0 10*3/uL — ABNORMAL LOW (ref 0.7–4.0)
MCH: 31.9 pg (ref 26.0–34.0)
MCHC: 31.6 g/dL (ref 30.0–36.0)
MCV: 100.7 fL — ABNORMAL HIGH (ref 80.0–100.0)
Metamyelocytes Relative: 1 %
Monocytes Absolute: 0.6 10*3/uL (ref 0.1–1.0)
Monocytes Relative: 4 %
Myelocytes: 1 %
Neutro Abs: 13.4 10*3/uL — ABNORMAL HIGH (ref 1.7–7.7)
Neutrophils Relative %: 93 %
Platelets: 33 10*3/uL — ABNORMAL LOW (ref 150–400)
RBC: 2.95 MIL/uL — ABNORMAL LOW (ref 4.22–5.81)
RDW: 14.9 % (ref 11.5–15.5)
WBC: 14.4 10*3/uL — ABNORMAL HIGH (ref 4.0–10.5)
nRBC: 1 % — ABNORMAL HIGH (ref 0.0–0.2)

## 2023-09-16 LAB — IRON AND TIBC
Iron: 43 ug/dL — ABNORMAL LOW (ref 45–182)
Saturation Ratios: 16 % — ABNORMAL LOW (ref 17.9–39.5)
TIBC: 267 ug/dL (ref 250–450)
UIBC: 224 ug/dL

## 2023-09-16 LAB — D-DIMER, QUANTITATIVE: D-Dimer, Quant: >20 ug{FEU}/mL — ABNORMAL HIGH (ref 0.00–0.50)

## 2023-09-16 LAB — COMPREHENSIVE METABOLIC PANEL
ALT: 38 U/L (ref 0–44)
AST: 73 U/L — ABNORMAL HIGH (ref 15–41)
Albumin: 2.9 g/dL — ABNORMAL LOW (ref 3.5–5.0)
Alkaline Phosphatase: 71 U/L (ref 38–126)
Anion gap: 8 (ref 5–15)
BUN: 31 mg/dL — ABNORMAL HIGH (ref 8–23)
CO2: 26 mmol/L (ref 22–32)
Calcium: 8.8 mg/dL — ABNORMAL LOW (ref 8.9–10.3)
Chloride: 106 mmol/L (ref 98–111)
Creatinine, Ser: 1 mg/dL (ref 0.61–1.24)
GFR, Estimated: >60 mL/min (ref >60)
Glucose, Bld: 92 mg/dL (ref 70–99)
Potassium: 3.9 mmol/L (ref 3.5–5.1)
Sodium: 140 mmol/L (ref 135–145)
Total Bilirubin: 0.9 mg/dL (ref 0.0–1.2)
Total Protein: 5.2 g/dL — ABNORMAL LOW (ref 6.5–8.1)

## 2023-09-16 LAB — GLUCOSE, CAPILLARY: Glucose-Capillary: 84 mg/dL (ref 70–99)

## 2023-09-16 LAB — FIBRINOGEN: Fibrinogen: 60 mg/dL — CL (ref 210–475)

## 2023-09-16 LAB — PROTIME-INR
INR: 2 — ABNORMAL HIGH (ref 0.8–1.2)
Prothrombin Time: 22.5 s — ABNORMAL HIGH (ref 11.4–15.2)

## 2023-09-16 LAB — APTT: aPTT: 34 s (ref 24–36)

## 2023-09-16 MED ORDER — SODIUM CHLORIDE 0.9% IV SOLUTION: Freq: Once | INTRAVENOUS | Status: DC

## 2023-09-16 MED ORDER — ONDANSETRON HCL 4 MG/2ML IJ SOLN
4.0000 mg | Freq: Four times a day (QID) | INTRAMUSCULAR | Status: DC | PRN
  Administered 2023-09-16: 4 mg via INTRAVENOUS
  Filled 2023-09-16: qty 2

## 2023-09-16 MED ORDER — ORAL CARE MOUTH RINSE
15.0000 mL | OROMUCOSAL | Status: DC
  Administered 2023-09-16 – 2023-09-17 (×4): 15 mL via OROMUCOSAL

## 2023-09-16 MED ORDER — SUCRALFATE 1 GM/10ML PO SUSP
1.0000 g | Freq: Four times a day (QID) | ORAL | Status: DC
  Administered 2023-09-16 – 2023-09-17 (×6): 1 g via ORAL
  Filled 2023-09-16 (×6): qty 10

## 2023-09-16 MED ORDER — ORAL CARE MOUTH RINSE: 15.0000 mL | OROMUCOSAL | Status: DC | PRN

## 2023-09-16 MED ORDER — DEXTROSE IN LACTATED RINGERS 5 % IV SOLN: INTRAVENOUS | Status: AC

## 2023-09-16 MED ORDER — GABAPENTIN 250 MG/5ML PO SOLN
100.0000 mg | Freq: Two times a day (BID) | ORAL | Status: DC | PRN
  Administered 2023-09-16 – 2023-09-21 (×9): 100 mg
  Filled 2023-09-16 (×19): qty 2

## 2023-09-16 NOTE — H&P (Signed)
 NAME:  Samuel [name], MRN:  [id number], DOB:  [birth year], LOS: 1 ADMISSION DATE:  09/15/2023, CONSULTATION DATE:  09/15/23 REFERRING MD:  Montel Clock PA, EM, CHIEF COMPLAINT:  melena    History of Present Illness:  78yo M PMH esophageal cancer s/p chemoradiation, cellulitis (hospitalized 1/14-1/19), functional decline, Hx DVT/PE (202 who was recently taken off eliquis 09/11/2023 in setting of worsening thrombocytopenia / possible DIC on outpt onc labs from 09/11/23 visit -- Ddimer >20, fibrinogen 89, INR 2.2, plt 26, though no schistos. Was Rx dexamethasone, and returned to onc 09/13/23 for Nplate.   Presented to ED 3/14 for CC melena, associated weakness and "hunger pains."  Reports melena x at least 3-4 days.  In ED 3/14, pt is HDS. Labs w Ddimer 18, Fibrinogen > 800, INR 9.2, plt 12, and WBC of 26.  Received 1mg  Vit K, PPI,  1 pack of plt. Also started on vanc cefepime zosyn.   GI was consulted re melena. CTA a/p without source of bleeding.   PCCM called for admission in setting of possible DIC     Pertinent  Medical History  Esophageal ca s/p chemo radiation Physical deconditioning PE/DVT Chronic AC Cellulitis HFmrEF   Significant Hospital Events: Including procedures, antibiotic start and stop dates in addition to other pertinent events   3/10 onc visit labs c/f DIC Rx steroids, eliquis stopped  3/12 return onc for Nplate  3/14 ED for melena, reportedly several days. Abnormal DIC panel. Rcvd 1 plt for Plt 12 and 1mg   Vit K for INE 9.32for GI consulted. PCCM to admit   Interim History / Subjective:   Started on oxycodone for leg cramps. Complains of hurting and balls of my feet Complains of hunger pains Afebrile Brown smear stool overnight  Objective   Blood pressure (!) 100/34, pulse 81, temperature 98 F (36.7 C), temperature source Oral, resp. rate 20, height 5\' 6"  (1.676 m), weight 70.9 kg, SpO2 97%.        Intake/Output Summary (Last 24 hours) at 09/16/2023  0951 Last data filed at 09/16/2023 0615 Gross per 24 hour  Intake 2661.3 ml  Output 550 ml  Net 2111.3 ml   Filed Weights   09/15/23 0945 09/16/23 0500  Weight: 73 kg 70.9 kg    Examination: General: chronically ill appearing older adult M NAD  HENT: NCAT  Lungs: Clear breath sounds bilateral, no accessory muscle use Cardiovascular: S1-S2 regular Abdomen: soft, mild tenderness. Gtube  Neuro: Awake alert.  Nonfocal GU: defer  Resolved Hospital Problem list     Assessment & Plan:     Upper GI bleed/melena -GI  consulted, no intervention w significant heme abnormalities -Cta a/p without source of bleeding -BID PPI -Monitor CBC every 12 -will hold EN for now  -Upper endoscopy at some point, GI following  Thrombocytopenia Coagulopathy  Anemia  ?DIC  -in onc office 3/10 DIC panel mostly c/w DIC (Ddimer >20, fibrinogen 89, INR 2.2, plt 26. No schistos, but that does not entirely r/o DIC. Immature plt fraction was high at 20 -- suggestive of plt destruction (DIC, ITP). Notably eliquis stopped 3/10 in setting of this & started on dexamethasone  -rcvd nplate 3/12 -DIC panel 3/14 Ddimer 18, fibrinogen > 800,  INR 9.2, plt 12, again no schistos.  -received 1 plt and 1 mg Vit K 3/14 -He took 3 doses of 40 mg dexamethasone P -repeat DIC panel and CBC pending -   Leukocytosis , likely related to dexamethasone -rcvd nplate 3/12 -rcvd vanc  cefepime flagyl in ED and bcx sent  P -Blood cultures negative so far, DC vancomycin  Hx esophageal cancer -s/p chemo rad -felt poor surgical candidate by cvts -on observation now. 07/2023 PET had no mets, did have some esophageal activity -- felt likely inflammation but can't r/o residual cancer  P -Oncology following -Oxycodone and Neurontin as needed for pain  Hx LLE DVT -holding eliquis in setting of above   New incomplete RBBB  -no ACS sx   P Telemetry  Best Practice (right click and "Reselect all SmartList Selections"  daily)   Diet/type: tubefeeds DVT prophylaxis SCD Pressure ulcer(s): pressure ulcer assessment deferred  GI prophylaxis: PPI Lines: N/A Foley:  N/A Code Status:  full code Last date of multidisciplinary goals of care discussion [09/15/23. Advanced directives reviewed]  Labs   CBC: Recent Labs  Lab 09/11/23 1228 09/11/23 1424 09/13/23 1336 09/15/23 1003 09/15/23 1030 09/15/23 1707 09/15/23 1708 09/15/23 2055  WBC 14.6*  --  19.0*  --  26.5* 23.3*  --   --   NEUTROABS 11.3*  --  17.2*  --  23.1* 18.8*  --   --   HGB 14.3  --  13.3  --  12.4* 10.1*  --  10.8*  HCT 42.3  --  39.4  --  39.4 32.7*  --  34.2*  MCV 93.0  --  92.1  --  99.2 101.6*  --   --   PLT 24*   < > 24* 12* 12* 55* 55*  --    < > = values in this interval not displayed.    Basic Metabolic Panel: Recent Labs  Lab 09/11/23 1228 09/13/23 1336 09/15/23 1030 09/15/23 2055  NA 136 135 137  --   K 4.3 4.7 4.4  --   CL 105 105 105  --   CO2 21* 20* 24  --   GLUCOSE 184* 173* 106*  --   BUN 33* 37* 47*  --   CREATININE 0.96 0.90 0.84  --   CALCIUM 8.6* 9.1 9.3  --   MG  --   --   --  2.2   GFR: Estimated Creatinine Clearance: 66.5 mL/min (by C-G formula based on SCr of 0.84 mg/dL). Recent Labs  Lab 09/11/23 1228 09/13/23 1336 09/15/23 1030 09/15/23 1707  WBC 14.6* 19.0* 26.5* 23.3*    Liver Function Tests: Recent Labs  Lab 09/11/23 1228 09/13/23 1336 09/15/23 1030  AST 30 31 46*  ALT 16 18 31   ALKPHOS 102 99 78  BILITOT 0.8 0.6 0.7  PROT 5.9* 5.9* 5.5*  ALBUMIN 3.4* 3.6 3.1*   No results for input(s): "LIPASE", "AMYLASE" in the last 168 hours. No results for input(s): "AMMONIA" in the last 168 hours.  ABG No results found for: "PHART", "PCO2ART", "PO2ART", "HCO3", "TCO2", "ACIDBASEDEF", "O2SAT"   Coagulation Profile: Recent Labs  Lab 09/11/23 1424 09/15/23 1003 09/15/23 1708  INR 2.2* 9.2* 3.6*    Cardiac Enzymes: No results for input(s): "CKTOTAL", "CKMB", "CKMBINDEX",  "TROPONINI" in the last 168 hours.  HbA1C: HbA1c, POC (prediabetic range)  Date/Time Value Ref Range Status  08/09/2022 01:50 PM 6.2 5.7 - 6.4 % Final   HbA1c, POC (controlled diabetic range)  Date/Time Value Ref Range Status  02/01/2022 02:03 PM 6.3 0.0 - 7.0 % Final   Hgb A1c MFr Bld  Date/Time Value Ref Range Status  05/23/2023 05:50 AM 6.4 (H) 4.8 - 5.6 % Final    Comment:    (NOTE) Pre diabetes:  5.7%-6.4%  Diabetes:              >6.4%  Glycemic control for   <7.0% adults with diabetes   12/15/2022 10:54 AM 7.0 (H) 4.8 - 5.6 % Final    Comment:             Prediabetes: 5.7 - 6.4          Diabetes: >6.4          Glycemic control for adults with diabetes: <7.0     CBG: No results for input(s): "GLUCAP" in the last 168 hours.   Cyril Mourning MD. Tonny Bollman. [facility] Pager : [phone]  If no response to pager , please call [phone] until 7 pm After 7:00 pm call Elink  [phone]     09/16/2023, 9:51 AM

## 2023-09-16 NOTE — Progress Notes (Signed)
 Weekend coverage for Dr. Lance Coon. [address]  Subjective: Patient had 1 episode of emesis which contained clear fluid. He also had a bowel movement which was brown in color.  Objective: Vital signs in last 24 hours: Temp:  [97.5 F (36.4 C)-98.6 F (37 C)] 98 F (36.7 C) (03/15 0800) Pulse Rate:  [63-145] 81 (03/15 0815) Resp:  [0-24] 20 (03/15 0815) BP: (74-136)/(34-85) 100/34 (03/15 0815) SpO2:  [93 %-99 %] 97 % (03/15 0815) Weight:  [70.9 kg] 70.9 kg (03/15 0500) Weight change:  Last BM Date : 09/16/23  PE: Prominent pallor GENERAL: Ill-appearing  ABDOMEN: PEG tube in place without associated tenderness EXTREMITIES: No deformity  Lab Results: Results for orders placed or performed during the hospital encounter of 09/15/23 (from the past 48 hours)  DIC Panel ONCE - STAT     Status: Abnormal   Collection Time: 09/15/23 10:03 AM  Result Value Ref Range   Prothrombin Time 75.0 (H) 11.4 - 15.2 seconds   INR 9.2 (HH) 0.8 - 1.2    Comment: CRITICAL RESULT CALLED TO, READ BACK BY AND VERIFIED WITH: RN [facility] AT 1230 09/15/23 CRUICKSHANK A (NOTE) INR goal varies based on device and disease states.    aPTT >200 (HH) 24 - 36 seconds    Comment:        IF BASELINE aPTT IS ELEVATED, SUGGEST PATIENT RISK ASSESSMENT BE USED TO DETERMINE APPROPRIATE ANTICOAGULANT THERAPY. CRITICAL RESULT CALLED TO, READ BACK BY AND VERIFIED WITH: RN [facility] AT 1230 09/15/23 CRUICKSHANK A    Fibrinogen >800 (H) 210 - 475 mg/dL   D-Dimer, Quant 02.72 (H) 0.00 - 0.50 ug/mL-FEU    Comment: (NOTE) At the manufacturer cut-off value of 0.5 g/mL FEU, this assay has a negative predictive value of 95-100%.This assay is intended for use in conjunction with a clinical pretest probability (PTP) assessment model to exclude pulmonary embolism (PE) and deep venous thrombosis (DVT) in outpatients suspected of PE or DVT. Results should be correlated with clinical presentation. CORRECTED ON 03/14 AT 1233: PREVIOUSLY  REPORTED AS 18.85    Platelets 12 (LL) 150 - 400 K/uL    Comment: CRITICAL RESULT CALLED TO, READ BACK BY AND VERIFIED WITH: RN [facility] AT 1230 09/15/23 CRUICKSHANK A    Smear Review NO SCHISTOCYTES SEEN     Comment: Performed at [facility], [address], [place], [place] [address]  POC occult blood, ED     Status: Abnormal   Collection Time: 09/15/23 10:23 AM  Result Value Ref Range   Fecal Occult Bld POSITIVE (A) NEGATIVE  CBC with Differential     Status: Abnormal   Collection Time: 09/15/23 10:30 AM  Result Value Ref Range   WBC 26.5 (H) 4.0 - 10.5 K/uL   RBC 3.97 (L) 4.22 - 5.81 MIL/uL   Hemoglobin 12.4 (L) 13.0 - 17.0 g/dL   HCT 40.3 47.4 - 25.9 %   MCV 99.2 80.0 - 100.0 fL   MCH 31.2 26.0 - 34.0 pg   MCHC 31.5 30.0 - 36.0 g/dL   RDW 56.3 87.5 - 64.3 %   Platelets 12 (LL) 150 - 400 K/uL    Comment: SPECIMEN CHECKED FOR CLOTS Immature Platelet Fraction may be clinically indicated, consider ordering this additional test [id number] REPEATED TO VERIFY PLATELET COUNT CONFIRMED BY SMEAR CRITICAL RESULT CALLED TO, READ BACK BY AND VERIFIED WITH: RN [facility] AT 1230 09/15/23 CRUICKSHANK A    nRBC 0.5 (H) 0.0 - 0.2 %   Neutrophils Relative % 86 %  Neutro Abs 23.1 (H) 1.7 - 7.7 K/uL   Band Neutrophils 1 %   Lymphocytes Relative 0 %   Lymphs Abs 0.0 (L) 0.7 - 4.0 K/uL   Monocytes Relative 11 %   Monocytes Absolute 2.9 (H) 0.1 - 1.0 K/uL   Eosinophils Relative 0 %   Eosinophils Absolute 0.0 0.0 - 0.5 K/uL   Basophils Relative 0 %   Basophils Absolute 0.0 0.0 - 0.1 K/uL   WBC Morphology Mild Left Shift (1-5% metas, occ myelo)    RBC Morphology MORPHOLOGY UNREMARKABLE    Myelocytes 2 %   Abs Immature Granulocytes 0.50 (H) 0.00 - 0.07 K/uL    Comment: Performed at [facility], [address], [place], [place] [address]  Comprehensive metabolic panel     Status: Abnormal   Collection Time: 09/15/23 10:30 AM  Result Value Ref Range    Sodium 137 135 - 145 mmol/L   Potassium 4.4 3.5 - 5.1 mmol/L   Chloride 105 98 - 111 mmol/L   CO2 24 22 - 32 mmol/L   Glucose, Bld 106 (H) 70 - 99 mg/dL    Comment: Glucose reference range applies only to samples taken after fasting for at least 8 hours.   BUN 47 (H) 8 - 23 mg/dL   Creatinine, Ser 0.98 0.61 - 1.24 mg/dL   Calcium 9.3 8.9 - 11.9 mg/dL   Total Protein 5.5 (L) 6.5 - 8.1 g/dL   Albumin 3.1 (L) 3.5 - 5.0 g/dL   AST 46 (H) 15 - 41 U/L   ALT 31 0 - 44 U/L   Alkaline Phosphatase 78 38 - 126 U/L   Total Bilirubin 0.7 0.0 - 1.2 mg/dL   GFR, Estimated >14 >78 mL/min    Comment: (NOTE) Calculated using the CKD-EPI Creatinine Equation (2021)    Anion gap 8 5 - 15    Comment: Performed at [facility], [address] [place], [place] [address]  Type and screen [facility]     Status: None   Collection Time: 09/15/23 10:30 AM  Result Value Ref Range   ABO/RH(D) O POS    Antibody Screen NEG    Sample Expiration      09/18/2023,2359 Performed at [facility], [address] [place], [place] [address]   ABO/Rh     Status: None   Collection Time: 09/15/23 12:00 PM  Result Value Ref Range   ABO/RH(D)      O POS Performed at [facility], [address], [place], [place] [address]   Urinalysis, Routine w reflex microscopic -Urine, Clean Catch     Status: None   Collection Time: 09/15/23 12:15 PM  Result Value Ref Range   Color, Urine YELLOW YELLOW   APPearance CLEAR CLEAR   Specific Gravity, Urine 1.019 1.005 - 1.030   pH 5.0 5.0 - 8.0   Glucose, UA NEGATIVE NEGATIVE mg/dL   Hgb urine dipstick NEGATIVE NEGATIVE   Bilirubin Urine NEGATIVE NEGATIVE   Ketones, ur NEGATIVE NEGATIVE mg/dL   Protein, ur NEGATIVE NEGATIVE mg/dL   Nitrite NEGATIVE NEGATIVE   Leukocytes,Ua NEGATIVE NEGATIVE    Comment: Performed at [facility], [address], [place], [place] [address]  Prepare  platelet pheresis     Status: None (Preliminary result)   Collection Time: 09/15/23 12:16 PM  Result Value Ref Range   Unit Number [id number]    Blood Component Type PSORALEN TREATED    Unit  division 00    Status of Unit ISSUED    Transfusion Status      OK TO TRANSFUSE Performed at [facility], [address] [place], [place] [address]   Blood culture (routine x 2)     Status: None (Preliminary result)   Collection Time: 09/15/23  4:30 PM   Specimen: BLOOD RIGHT HAND  Result Value Ref Range   Specimen Description      BLOOD RIGHT HAND Performed at [facility], [address] [place], [place] [address]    Special Requests      BOTTLES DRAWN AEROBIC AND ANAEROBIC Blood Culture results may not be optimal due to an inadequate volume of blood received in culture bottles Performed at [facility], [address] [place], [place] [address]    Culture      NO GROWTH < 24 HOURS Performed at [facility], [address], [place], [place] [address]    Report Status PENDING   Blood culture (routine x 2)     Status: None (Preliminary result)   Collection Time: 09/15/23  4:35 PM   Specimen: BLOOD LEFT ARM  Result Value Ref Range   Specimen Description      BLOOD LEFT ARM Performed at [facility], [address] [place], [place] [address]    Special Requests      BOTTLES DRAWN AEROBIC AND ANAEROBIC Blood Culture results may not be optimal due to an inadequate volume of blood received in culture bottles Performed at [facility], [address] [place], [place] [address]    Culture      NO GROWTH < 24 HOURS Performed at [facility], [address] [place], [place] [address]    Report Status PENDING   CBC with Differential     Status: Abnormal   Collection Time: 09/15/23  5:07 PM  Result Value Ref Range   WBC 23.3 (H) 4.0 - 10.5 K/uL   RBC 3.22 (L) 4.22 - 5.81 MIL/uL   Hemoglobin 10.1 (L)  13.0 - 17.0 g/dL   HCT 06.2 (L) 37.6 - 28.3 %   MCV 101.6 (H) 80.0 - 100.0 fL   MCH 31.4 26.0 - 34.0 pg   MCHC 30.9 30.0 - 36.0 g/dL   RDW 15.1 76.1 - 60.7 %   Platelets 55 (L) 150 - 400 K/uL    Comment: Immature Platelet Fraction may be clinically indicated, consider ordering this additional test [id number] CONSISTENT WITH PREVIOUS RESULT REPEATED TO VERIFY POST TRANSFUSION SPECIMEN    nRBC 0.7 (H) 0.0 - 0.2 %   Neutrophils Relative % 81 %   Neutro Abs 18.8 (H) 1.7 - 7.7 K/uL   Lymphocytes Relative 2 %   Lymphs Abs 0.4 (L) 0.7 - 4.0 K/uL   Monocytes Relative 11 %   Monocytes Absolute 2.6 (H) 0.1 - 1.0 K/uL   Eosinophils Relative 0 %   Eosinophils Absolute 0.0 0.0 - 0.5 K/uL   Basophils Relative 0 %   Basophils Absolute 0.1 0.0 - 0.1 K/uL   Immature Granulocytes 6 %   Abs Immature Granulocytes 1.42 (H) 0.00 - 0.07 K/uL    Comment: Performed at [facility], [address] [place], [place] [address]  DIC Panel ONCE - STAT     Status: Abnormal   Collection Time: 09/15/23  5:08 PM  Result Value Ref Range   Prothrombin Time 36.1 (H) 11.4 - 15.2 seconds  INR 3.6 (H) 0.8 - 1.2    Comment: (NOTE) INR goal varies based on device and disease states.    aPTT 40 (H) 24 - 36 seconds    Comment:        IF BASELINE aPTT IS ELEVATED, SUGGEST PATIENT RISK ASSESSMENT BE USED TO DETERMINE APPROPRIATE ANTICOAGULANT THERAPY.    Fibrinogen >800 (H) 210 - 475 mg/dL    Comment: REPEATED TO VERIFY   D-Dimer, Quant 16.38 (H) 0.00 - 0.50 ug/mL-FEU    Comment: (NOTE) At the manufacturer cut-off value of 0.5 g/mL FEU, this assay has a negative predictive value of 95-100%.This assay is intended for use in conjunction with a clinical pretest probability (PTP) assessment model to exclude pulmonary embolism (PE) and deep venous thrombosis (DVT) in outpatients suspected of PE or DVT. Results should be correlated with clinical presentation. CORRECTED ON 03/14 AT 1839:  PREVIOUSLY REPORTED AS 16.38    Platelets 55 (L) 150 - 400 K/uL    Comment: SPECIMEN CHECKED FOR CLOTS Immature Platelet Fraction may be clinically indicated, consider ordering this additional test [id number] REPEATED TO VERIFY    Smear Review NO SCHISTOCYTES SEEN     Comment: Performed at [facility], [address] [place], [place] [address]  Immature Platelet Fraction     Status: None   Collection Time: 09/15/23  5:08 PM  Result Value Ref Range   Immature Platelet Fraction 4.2 1.2 - 8.6 %    Comment: Performed at [facility], [address], [place], [place] [address]  Prepare platelet pheresis     Status: None (Preliminary result)   Collection Time: 09/15/23  6:45 PM  Result Value Ref Range   Unit Number [id number]    Blood Component Type PLTP1 PSORALEN TREATED    Unit division 00    Status of Unit ISSUED    Transfusion Status      OK TO TRANSFUSE Performed at [facility], [address] [place], [place] [address]   Prepare fresh frozen plasma     Status: None (Preliminary result)   Collection Time: 09/15/23  6:45 PM  Result Value Ref Range   Unit Number [id number]    Blood Component Type THW PLS APHR    Unit division A0    Status of Unit ISSUED    Transfusion Status OK TO TRANSFUSE    Unit Number [id number]    Blood Component Type THW PLS APHR    Unit division A0    Status of Unit ISSUED    Transfusion Status OK TO TRANSFUSE    Unit Number [id number]    Blood Component Type THW PLS APHR    Unit division A0    Status of Unit ISSUED    Transfusion Status      OK TO TRANSFUSE Performed at [facility], [address] [place], [place] [address]   MRSA Next Gen by PCR, Nasal     Status: None   Collection Time: 09/15/23  8:43 PM   Specimen: Nasal Mucosa; Nasal Swab  Result Value Ref Range   MRSA by PCR Next Gen NOT DETECTED NOT DETECTED    Comment: (NOTE) The GeneXpert  MRSA Assay (FDA approved for NASAL specimens only), is one component of a comprehensive MRSA colonization surveillance program. It is not intended to diagnose MRSA infection nor to guide or monitor treatment for MRSA infections. Test performance is not FDA approved in patients less than 31 years old. Performed at [facility], [address] [place], [place] [address]   Hemoglobin and hematocrit, blood     Status: Abnormal   Collection Time: 09/15/23  8:55 PM  Result Value Ref Range   Hemoglobin 10.8 (L) 13.0 - 17.0 g/dL   HCT 91.4 (L) 78.2 - 95.6 %    Comment: Performed at [facility], [address], [place], [place] [address]  Magnesium     Status: None   Collection Time: 09/15/23  8:55 PM  Result Value Ref Range   Magnesium 2.2 1.7 - 2.4 mg/dL    Comment: Performed at [facility], 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 Black tarry stool. History of esophageal cancer. * Tracking Code: BO * EXAM: CTA ABDOMEN AND PELVIS WITHOUT AND WITH CONTRAST TECHNIQUE: Multidetector CT imaging of the abdomen and pelvis was performed using the standard protocol during bolus administration of intravenous contrast. Multiplanar reconstructed images and MIPs were obtained and reviewed to evaluate the vascular anatomy. RADIATION DOSE REDUCTION: This exam was performed according to the departmental dose-optimization program which includes automated exposure control, adjustment of the mA and/or kV according to patient size and/or use of iterative reconstruction technique. CONTRAST:  OMNIPAQUE IOHEXOL 350 MG/ML SOLN COMPARISON:  CT scan abdomen and pelvis from 04/19/2023. FINDINGS: VASCULAR Aorta: Normal caliber aorta without aneurysm, dissection, vasculitis or significant stenosis. Celiac: Patent without  evidence of aneurysm, dissection, vasculitis or significant stenosis. SMA: Patent without evidence of aneurysm, dissection, vasculitis or significant stenosis. Note is made of small area of a fat stranding in the left upper abdomen mesentery (series 6, images 55-58), which contains several 1-2 mm diameter sized vessels within. The area is supplied by the SMA/SMV branches (jejunal and ileal branches). This is new since the prior CT scan from 04/19/2023. No associated adjacent abnormal bowel wall thickening or focal mass. 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 However, this is most likely not a cause of patient's GI bleeding. Renals: Both renal arteries are patent without evidence of aneurysm, dissection, vasculitis, fibromuscular dysplasia or significant stenosis. IMA: Patent without evidence of aneurysm, dissection, vasculitis or significant stenosis. Inflow: There is ectasia of the left internal iliac artery measuring up to 9 mm in diameter. The inflow arteries are otherwise patent without evidence of aneurysm, dissection, vasculitis or significant stenosis. Proximal Outflow: Bilateral common femoral and visualized portions of the superficial and profunda femoral arteries are patent without evidence of aneurysm, dissection, vasculitis or significant stenosis. Veins: No obvious venous abnormality within the limitations of this arterial phase study. Review of the MIP images confirms the above findings. NON-VASCULAR Lower chest: There is partially imaged at least trace left pleural effusion with associated pleural thickening. The lung bases are otherwise clear. No left pleural effusion. The heart is normal in size. No pericardial effusion. Hepatobiliary: The liver is normal in size. Non-cirrhotic configuration. No suspicious mass. No intrahepatic or extrahepatic bile duct dilation. Redemonstration of an approximately 5 mm calcification along the dependent wall of the gallbladder at the fold,  which may represent adherent gallstone versus focal gallbladder wall calcification. No significant interval change since the prior study. No imaging evidence of acute cholecystitis. Otherwise normal gallbladder wall. No pericholecystic inflammatory changes. Pancreas: Redemonstration of an approximately 1.1 x 1.6 cm nonenhancing hypoattenuating lesion adjacent to the pancreatic head. The lesion is essentially unchanged since the prior study. No main pancreatic  duct dilation. 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 Further evaluation with MRI abdomen as per pancreatic mass protocol is recommended, unless recently performed. Otherwise unremarkable pancreas. No peripancreatic fat stranding. Spleen: Within normal limits. No focal lesion. Adrenals/Urinary Tract: Adrenal glands are unremarkable. No suspicious renal mass. Redemonstration of bilobed predominantly exophytic cyst arising from the left kidney, anteriorly measuring 5.7 x 8.2 cm. No nephroureterolithiasis or obstructive uropathy on either side. Unremarkable urinary bladder. Stomach/Bowel: Note is made of midline gastrostomy tube. No disproportionate dilation of the small or large bowel loops. No evidence of abnormal bowel wall thickening or inflammatory changes. The appendix is unremarkable. There are multiple diverticula mainly in the left hemi colon, without imaging signs of diverticulitis. Vascular/Lymphatic: No ascites or pneumoperitoneum. No abdominal or pelvic lymphadenopathy, by size criteria. No aneurysmal dilation of the major abdominal arteries. There are mild peripheral atherosclerotic vascular calcifications of the aorta and its major branches. Reproductive: Enlarged prostate. Symmetric seminal vesicles. Other: There is a tiny fat containing umbilical hernia. The soft tissues and abdominal wall are otherwise unremarkable. Musculoskeletal: No suspicious osseous lesions. There are mild -  moderate multilevel degenerative changes in the visualized spine. IMPRESSION: VASCULAR *There is a small area of fat stranding in the left upper abdomen, which contains several 1-2 mm sized vessels within. 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 NON-VASCULAR *No acute inflammatory process identified within the abdomen or pelvis. *There is a stable, approximately 1.1 x 1.6 cm nonenhancing hypoattenuating lesion adjacent to the pancreatic head. 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 Further evaluation with MRI abdomen as per pancreatic mass protocol is recommended, unless recently performed. *Both multiple other nonacute observations, as described above. Aortic Atherosclerosis (ICD10-I70.0).  Bone Electronically Signed   By: Jules Schick M.D.   On: 09/15/2023 15:13    Medications: I have reviewed the patient's current medications.  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 Patient with known esophageal cancer and melena likely related to bleeding from esophageal cancer in setting of DIC. Recommend conservative management.  Currently on pantoprazole 40 mg every 12 hours. Will add sucralfate suspension 1 g / 10 mL 4 times a day. Patient has received 3 units of FFP and 2 units of platelet along with vitamin K 1 mg x 1. Monitor H&H and transfuse if needed.  Kerin Salen, MD 09/16/2023,  10:31 AM

## 2023-09-16 NOTE — Progress Notes (Signed)
 Overall, I think that Mr. Stamper is better.  I just really wonder if the labs yesterday were that accurate.  When repeated, the PTT was 40 seconds.  The INR was 3.6.  We given him some platelets and FFP.  I do not think there is any further bleeding.  His fibrinogen is still greater than 800.  His D-dimer is 16.4.  There is no CBC back yet today.  His CBC was was repeated yesterday afternoon.  Platelet count 55,000.  Hemoglobin 10.1.  He now is in the ICU.  Think that if you were to have a endoscopy, that would be safe.  Hopefully, he will be able to continue to improve, particularly with his platelet count.  Still not clear as to why he has had such thrombocytopenia.   His vital signs show temperature 98.4.  Pulse 102.  Blood pressure 92/61.  His lungs sound clear bilaterally.  Cardiac exam regular rate and rhythm.  Abdomen is soft.  Bowel sounds are present.  There may be a little bit of tenderness to palpation at the epigastric area.  There is no fluid wave.  Extremity shows no clubbing, cyanosis or edema.  I still believe that the GI bleeding will have to be evaluated.  Again, I am really not convinced that there is this DIC going on.  However, we are being proactive and having FFP and platelets given to him.  Again I think that hopefully the bleeding is stopped.  We will have to check his iron studies.  I am sure that he probably has an element of iron deficiency.  I do not see any evidence of cancer as a etiology for this problem.   Christin Bach, MD  [name] 1:5

## 2023-09-16 NOTE — Plan of Care (Signed)

## 2023-09-16 NOTE — Evaluation (Signed)
 Clinical/Bedside Swallow Evaluation Patient Details  Name: Samuel [name] MRN: [id number] Date of Birth: 02-21-1946  Today's Date: 09/16/2023 Time: SLP Start Time (ACUTE ONLY): 1514 SLP Stop Time (ACUTE ONLY): 1537 SLP Time Calculation (min) (ACUTE ONLY): 23 min  Past Medical History:  Past Medical History:  Diagnosis Date   Diabetes mellitus without complication (HCC)    Esophageal cancer (HCC)    Pulmonary embolism (HCC)    In October 2024   Past Surgical History:  Past Surgical History:  Procedure Laterality Date   BIOPSY  04/18/2023   Procedure: BIOPSY;  Surgeon: Lynann Bologna, DO;  Location: [facility] ENDOSCOPY;  Service: Gastroenterology;;   ESOPHAGOGASTRODUODENOSCOPY N/A 04/18/2023   Procedure: ESOPHAGOGASTRODUODENOSCOPY (EGD);  Surgeon: Lynann Bologna, DO;  Location: [facility] ENDOSCOPY;  Service: Gastroenterology;  Laterality: N/A;   IR GASTROSTOMY TUBE MOD SED  04/20/2023   IR NASO G TUBE PLC W/FL W/RAD  04/20/2023   KNEE ARTHROPLASTY Bilateral    HPI:  Samuel [name] is a 78yo M who presented to ED 3/14 for CC melena, associated weakness and "hunger pains." Reports melena x at least 3-4 days. CT 3/14: "There is partially imaged at least trace left pleural effusion with associated pleural thickening. The lung bases are otherwise clear. No left pleural effusion."   Pt with PMH esophageal cancer s/p chemoradiation, cellulitis (hospitalized 1/14-1/19), functional decline, Hx DVT/PE. Esophagram 04/17/23: " Abrupt tapering in the thoracic esophagus with only a  trickle of barium observed to intermittently pass into the stomach. Unable to assess for motility, hiatal hernia, and gastroesophageal  reflux. 13mm barium tablet was not given.     IMPRESSION:  Long-segment distal esophageal stenosis, measuring ~5 cm in  length, with resulting pre stenotic esophageal distention."    Assessment / Plan / Recommendation  Clinical Impression  Pt presents with what appears to be a primary  esophageal dysphagia in setting of hx esophageal ca s/p chemoradiation.  Pt has PEG and is reliant on tube feedings for nutrition and medication, but takes some POs by mouth, mostly liquids, but does have some pudding and other "soft, wet snacks."  Pt tolerated thin liquid by cup and straw and puree by spoon with no clinical s/s of aspiration.  Pt reports feeling of stasis/slow esophageal clearance.  He was noted to take multiple swallows.  Thin liquid was was beneficial following trial of puree.  There was belching noted.  He reports that sometimes when he gets a swallow going he will start belching.  Pt would like to resume sips as he has been.  He seems very aware of swallowing deficits and able to manage POs safely and indepedently.  He will continue to require PEG for nutrition as he cannot take very much by mouth.   Recommend full liquid diet, thin liquids.  Pt may have snacks of puree from floorstock if desired.   SLP Visit Diagnosis: Dysphagia, pharyngoesophageal phase (R13.14)    Aspiration Risk  Mild aspiration risk    Diet Recommendation Thin liquid;Dysphagia 1 (Puree)    Liquid Administration via: Cup;Straw Medication Administration: Via alternative means Compensations: Slow rate;Small sips/bites;Follow solids with liquid Postural Changes: Seated upright at 90 degrees    Other  Recommendations Oral Care Recommendations: Oral care BID    Recommendations for follow up therapy are one component of a multi-disciplinary discharge planning process, led by the attending physician.  Recommendations may be updated based on patient status, additional functional criteria and insurance authorization.  Follow up Recommendations No SLP follow up  Assistance Recommended at Discharge  N/A  Functional Status Assessment Patient has not had a recent decline in their functional status  Frequency and Duration min 2x/week  2 weeks       Prognosis Prognosis for improved oropharyngeal  function: Fair Barriers to Reach Goals: Severity of deficits;Time post onset      Swallow Study   General Date of Onset: 09/15/23 HPI: Samuel [name] is a 78yo M who presented to ED 3/14 for CC melena, associated weakness and "hunger pains." Reports melena x at least 3-4 days. CT 3/14: "There is partially imaged at least trace left pleural effusion with associated pleural thickening. The lung bases are otherwise clear. No left pleural effusion."   Pt with PMH esophageal cancer s/p chemoradiation, cellulitis (hospitalized 1/14-1/19), functional decline, Hx DVT/PE. Esophagram 04/17/23: " Abrupt tapering in the thoracic esophagus with only a  trickle of barium observed to intermittently pass into the stomach. Unable to assess for motility, hiatal hernia, and gastroesophageal  reflux. 13mm barium tablet was not given.     IMPRESSION:  Long-segment distal esophageal stenosis, measuring ~5 cm in  length, with resulting pre stenotic esophageal distention." Type of Study: Bedside Swallow Evaluation Previous Swallow Assessment: None Diet Prior to this Study: NPO Temperature Spikes Noted: No History of Recent Intubation: No Behavior/Cognition: Alert;Cooperative;Pleasant mood Oral Cavity Assessment: Dry Oral Care Completed by SLP: No Oral Cavity - Dentition: Adequate natural dentition Patient Positioning: Upright in bed Baseline Vocal Quality: Normal Volitional Cough: Strong Volitional Swallow: Able to elicit    Oral/Motor/Sensory Function Overall Oral Motor/Sensory Function: Within functional limits   Ice Chips Ice chips: Not tested   Thin Liquid Thin Liquid: Within functional limits Presentation: Straw;Cup    Nectar Thick Nectar Thick Liquid: Not tested   Honey Thick Honey Thick Liquid: Not tested   Puree Puree: Within functional limits Presentation: Spoon   Solid     Solid: Not tested      Kerrie Pleasure, MA, CCC-SLP Acute Rehabilitation Services Office: [phone] 09/16/2023,4:02  PM

## 2023-09-16 NOTE — Progress Notes (Signed)
 Transition of Care [facility]) - Inpatient Brief Assessment  Patient Details  Name: Samuel [name] MRN: [id number] Date of Birth: Nov 13, 1945  [facility]) CM/SW Contact:    Ewing Schlein, LCSW Phone Number: 09/16/2023, 3:15 PM  Clinical Narrative: Screening completed. No TOC needs identified at this time.  Transition of Care Asessment: Insurance and Status: Insurance coverage has been reviewed Patient has primary care physician: Yes Home environment has been reviewed: Resides in single family home Prior level of function:: Independent with ADLs at baseline Prior/Current Home Services: No current home services Social Drivers of Health Review: SDOH reviewed no interventions necessary Readmission risk has been reviewed: Yes Transition of care needs: no transition of care needs at this time   09/16/23 1512  Readmission Prevention Plan - Extreme Risk  Transportation Screening Complete  Medication Review Complete  Home Care Consult Complete  Social Work consult for recovery care planning/counseling (includes patient and caregiver) Complete  Palliative Care Screening Not Applicable  Skilled Nursing Facility Not Applicable

## 2023-09-16 NOTE — Progress Notes (Signed)
 Coags improving, plts still low, & fibrinogen drop Transfuse cryo & plts  Olga Bourbeau V. Vassie Loll MD

## 2023-09-16 NOTE — Hospital Course (Addendum)
 Mr. 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 Has been on chronic Eliquis which was placed on hold on 3/10 due to concern for DIC.  Recently seen by oncology on 09/13/23.   Presented to hospital due to multiple melanotic stools.   CTA GIB study negative on admission. See detailed report.   Admitted with melena. GI following. Oncology following.  Conservative management per GI (no procedures scheduled).  Has received 3 FFP and 2 platelets.   A&P:  Melena - reported to have 3-4 days melena on admission, multiple stools per day - likely spontaneous occult bleeding from underlying DIC (see lab workup) especially with PLTC 12k on admission - now s/p 3 FFP and 2 platelets - GI following; no intervention at this time especially with no obvious bleeding noted on CTA GIB on admission - trend CBC  Esophageal adenocarcinoma - diagnosed 04/20/23; s/p chemoradiation - PEG in place for nutrition  - recent PET 08/03/23 showing hypermetabolic lesion distal esophagus; - had tele eval with CTS, Dr. 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 Mosetta Putt; PLTC has been dropping since Jan 2025 (was 303k 07/03/23 and now dropping steadily to ~20k and nadir 12k on admission - DIC panel positive per Dr. Mosetta Putt on 3/10 and now panel certainly worse on 3/14 on admission; not sure reasoning behind Dr. Myna Hidalgo not thinking this is DIC, as labs do suggest DIC ongoing/worsening (even with the repeat DIC panel on 3/14) - received 3 FFP and 2 platelets since admission - started on Nplate per oncology as no response to steroid previously tried also - continue trending CBC and will continue with necessary  transfusions; monitor for further evidence of bleeding/melena, etc  Leukocytosis - current suspicion is recent decadron use for thrombocytopenia; no obvious source of infection - started on vanc/cefepime in the ICU and cultures sent - UA negative and blood cultures remain negative to date - transitioned to Rocephin on 3/14 - trend CBC  History of PE History of DVT - CT chest 04/14/23 noted acute PE right main pulm artery extending to RUL and RLL - LE duplex on 07/18/23 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

## 2023-09-17 DIAGNOSIS — D689 Coagulation defect, unspecified: Secondary | ICD-10-CM

## 2023-09-17 LAB — CBC WITH DIFFERENTIAL/PLATELET
Abs Immature Granulocytes: 0.8 10*3/uL — ABNORMAL HIGH (ref 0.00–0.07)
Basophils Absolute: 0 10*3/uL (ref 0.0–0.1)
Basophils Relative: 0 %
Eosinophils Absolute: 0.3 10*3/uL (ref 0.0–0.5)
Eosinophils Relative: 2 %
HCT: 27.9 % — ABNORMAL LOW (ref 39.0–52.0)
Hemoglobin: 8.4 g/dL — ABNORMAL LOW (ref 13.0–17.0)
Immature Granulocytes: 6 %
Lymphocytes Relative: 2 %
Lymphs Abs: 0.3 10*3/uL — ABNORMAL LOW (ref 0.7–4.0)
MCH: 31.1 pg (ref 26.0–34.0)
MCHC: 30.1 g/dL (ref 30.0–36.0)
MCV: 103.3 fL — ABNORMAL HIGH (ref 80.0–100.0)
Monocytes Absolute: 1.5 10*3/uL — ABNORMAL HIGH (ref 0.1–1.0)
Monocytes Relative: 11 %
Neutro Abs: 10.5 10*3/uL — ABNORMAL HIGH (ref 1.7–7.7)
Neutrophils Relative %: 79 %
Platelets: 28 10*3/uL — CL (ref 150–400)
RBC: 2.7 MIL/uL — ABNORMAL LOW (ref 4.22–5.81)
RDW: 15 % (ref 11.5–15.5)
WBC: 13.4 10*3/uL — ABNORMAL HIGH (ref 4.0–10.5)
nRBC: 0.9 % — ABNORMAL HIGH (ref 0.0–0.2)

## 2023-09-17 LAB — APTT: aPTT: 34 s (ref 24–36)

## 2023-09-17 LAB — DIC (DISSEMINATED INTRAVASCULAR COAGULATION)PANEL
D-Dimer, Quant: >20 ug{FEU}/mL — ABNORMAL HIGH (ref 0.00–0.50)
Fibrinogen: 86 mg/dL — CL (ref 210–475)
INR: 1.9 — ABNORMAL HIGH (ref 0.8–1.2)
Platelets: 27 10*3/uL — CL (ref 150–400)
Prothrombin Time: 22.4 s — ABNORMAL HIGH (ref 11.4–15.2)
Smear Review: NONE SEEN
aPTT: 35 s (ref 24–36)

## 2023-09-17 LAB — PROTIME-INR
INR: 2 — ABNORMAL HIGH (ref 0.8–1.2)
Prothrombin Time: 22.6 s — ABNORMAL HIGH (ref 11.4–15.2)

## 2023-09-17 LAB — COMPREHENSIVE METABOLIC PANEL
ALT: 33 U/L (ref 0–44)
AST: 57 U/L — ABNORMAL HIGH (ref 15–41)
Albumin: 2.5 g/dL — ABNORMAL LOW (ref 3.5–5.0)
Alkaline Phosphatase: 63 U/L (ref 38–126)
Anion gap: 6 (ref 5–15)
BUN: 26 mg/dL — ABNORMAL HIGH (ref 8–23)
CO2: 26 mmol/L (ref 22–32)
Calcium: 8.5 mg/dL — ABNORMAL LOW (ref 8.9–10.3)
Chloride: 109 mmol/L (ref 98–111)
Creatinine, Ser: 0.9 mg/dL (ref 0.61–1.24)
GFR, Estimated: >60 mL/min (ref >60)
Glucose, Bld: 110 mg/dL — ABNORMAL HIGH (ref 70–99)
Potassium: 3.5 mmol/L (ref 3.5–5.1)
Sodium: 141 mmol/L (ref 135–145)
Total Bilirubin: 0.7 mg/dL (ref 0.0–1.2)
Total Protein: 4.7 g/dL — ABNORMAL LOW (ref 6.5–8.1)

## 2023-09-17 LAB — CBC
HCT: 34.4 % — ABNORMAL LOW (ref 39.0–52.0)
Hemoglobin: 10.5 g/dL — ABNORMAL LOW (ref 13.0–17.0)
MCH: 31 pg (ref 26.0–34.0)
MCHC: 30.5 g/dL (ref 30.0–36.0)
MCV: 101.5 fL — ABNORMAL HIGH (ref 80.0–100.0)
Platelets: 41 10*3/uL — ABNORMAL LOW (ref 150–400)
RBC: 3.39 MIL/uL — ABNORMAL LOW (ref 4.22–5.81)
RDW: 16 % — ABNORMAL HIGH (ref 11.5–15.5)
WBC: 15.3 10*3/uL — ABNORMAL HIGH (ref 4.0–10.5)
nRBC: 1 % — ABNORMAL HIGH (ref 0.0–0.2)

## 2023-09-17 LAB — PREPARE RBC (CROSSMATCH)

## 2023-09-17 MED ORDER — FUROSEMIDE 10 MG/ML IJ SOLN
20.0000 mg | Freq: Once | INTRAMUSCULAR | Status: AC
  Administered 2023-09-17: 20 mg via INTRAVENOUS
  Filled 2023-09-17: qty 2

## 2023-09-17 MED ORDER — POTASSIUM CHLORIDE 20 MEQ PO PACK
60.0000 meq | PACK | Freq: Once | ORAL | Status: AC
  Administered 2023-09-17: 60 meq
  Filled 2023-09-17: qty 3

## 2023-09-17 MED ORDER — ORAL CARE MOUTH RINSE: 15.0000 mL | OROMUCOSAL | Status: DC | PRN

## 2023-09-17 MED ORDER — POTASSIUM CHLORIDE 20 MEQ PO PACK: 60.0000 meq | PACK | Freq: Once | ORAL | Status: DC

## 2023-09-17 MED ORDER — SODIUM CHLORIDE 0.9% IV SOLUTION: Freq: Once | INTRAVENOUS | Status: DC

## 2023-09-17 MED ORDER — NA FERRIC GLUC CPLX IN SUCROSE 12.5 MG/ML IV SOLN
250.0000 mg | Freq: Every day | INTRAVENOUS | Status: AC
  Administered 2023-09-17 – 2023-09-18 (×2): 250 mg via INTRAVENOUS
  Filled 2023-09-17 (×2): qty 20

## 2023-09-17 NOTE — Progress Notes (Signed)
 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  Patient had been noted to have decline in functional status as an outpatient for the past month.  Patient was started on vancomycin, cefepime and Zosyn and GI was consulted.  CTA abdomen pelvis was negative for source of bleeding.  Of note, patient's Eliquis had been discontinued last week as an outpatient by Dr. Parke Poisson due to worsening thrombocytopenia, platelets 24, with concern for DIC.  He was also started on steroids at the time.     Subjective:  Patient states that he is sleepy and tired.  Denies any discomfort   Objective: Vitals:   09/17/23 1126 09/17/23 1200 09/17/23 1227 09/17/23 1416  BP: (!) 102/55 107/74 (!) 111/57 (!) 132/58  Pulse: 69 90  82  Resp: 19 14 19  (!) 22  Temp: 97.8 F (36.6 C) 98 F (36.7 C) 97.9 F (36.6 C) 97.7 F (36.5 C)  TempSrc: Oral Oral  Axillary  SpO2: 99% 97%  95%  Weight:      Height:        Intake/Output Summary (Last 24 hours) at 09/17/2023 1440 Last data filed at 09/17/2023 1416 Gross per 24 hour  Intake 1323.32 ml  Output 275 ml  Net 1048.32 ml   Filed Weights   09/15/23 0945 09/16/23 0500 09/17/23 0704  Weight: 73 kg 70.9 kg 64.3 kg     Exam:  General: Pale elderly man lying in bed, looks weak, no respiratory distress Eyes: sclera anicteric, conjuctiva mild injection bilaterally CVS: S1-S2, regular  Respiratory:  decreased air entry bilaterally secondary to decreased inspiratory effort, rales at bases  GI: NABS, soft, NT  LE: Normal muscle mass   Data Reviewed:  Basic Metabolic Panel: Recent Labs  Lab 09/11/23 1228  09/13/23 1336 09/15/23 1030 09/15/23 2055 09/16/23 1432 09/17/23 0244  NA 136 135 137  --  140 141  K 4.3 4.7 4.4  --  3.9 3.5  CL 105 105 105  --  106 109  CO2 21* 20* 24  --  26 26  GLUCOSE 184* 173* 106*  --  92 110*  BUN 33* 37* 47*  --  31* 26*  CREATININE 0.96 0.90 0.84  --  1.00 0.90  CALCIUM 8.6* 9.1 9.3  --  8.8* 8.5*  MG  --   --   --  2.2  --   --     CBC: Recent Labs  Lab 09/13/23 1336 09/15/23 1003 09/15/23 1030 09/15/23 1707 09/15/23 1708 09/15/23 2055 09/16/23 1432 09/17/23 0244 09/17/23 0245  WBC 19.0*  --  26.5* 23.3*  --   --  14.4* 13.4*  --   NEUTROABS 17.2*  --  23.1* 18.8*  --   --  13.4* 10.5*  --   HGB 13.3  --  12.4* 10.1*  --  10.8* 9.4* 8.4*  --   HCT 39.4  --  39.4 32.7*  --  34.2* 29.7* 27.9*  --   MCV 92.1  --  99.2 101.6*  --   --  100.7* 103.3*  --   PLT 24*   < > 12* 55* 55*  --  33* 28* 27*   < > =  values in this interval not displayed.     Scheduled Meds:  sodium chloride   Intravenous Once   Chlorhexidine Gluconate Cloth  6 each Topical Daily   furosemide  20 mg Intravenous Once   nystatin  5 mL Oral QID   mouth rinse  15 mL Mouth Rinse 4 times per day   pantoprazole (PROTONIX) IV  40 mg Intravenous Q12H   [START ON 09/20/2023] romiPLOStim  2 mcg/kg Subcutaneous Once   sucralfate  1 g Oral Q6H   Continuous Infusions:  cefTRIAXone (ROCEPHIN)  IV Stopped (09/16/23 2325)   dextrose 5% lactated ringers 75 mL/hr at 09/17/23 1212   ferric gluconate (FERRLECIT) IVPB Stopped (09/17/23 1235)     Assessment & Plan:   Melena ABLA Had melena for 3 to 4 days prior to admission with multiple stools a day Hemoglobin on admission was 13, it has drifted down slowly to 8.4 today Patient seen by GI who ordered CTA abdomen pelvis which was negative for bleeding source No instrumentation/endoscopy is planned by GI at present Per Dr. 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 Mosetta Putt,  Of note patient's platelets were 300 K in December 2024 and have dropped steadily since DIC panel was positive as an outpatient on 3/10 and Dr. Marshall Cork discontinued Eliquis and started steroids Since admission fibrinogen had increased to > 8000 but now down to 86 Per Dr. Jennet Maduro (see hospital course): - DIC panel positive per Dr. Mosetta Putt on 3/10 and now panel certainly worse on 3/14 on admission; not sure reasoning behind Dr. Myna Hidalgo not thinking this is DIC, as labs do suggest DIC ongoing/worsening (even with the repeat DIC panel on 3/14) - received 3 FFP and 2 platelets since admission - started on Nplate per oncology as no response to steroid previously tried also - continue trending CBC and will continue with necessary transfusions; monitor for further evidence of bleeding/melena, etc  Leukocytosis No clear source of infection Was empirically started on vancomycin and cefepime Urine and blood cultures are negative to date Now on ceftriaxone from 3/14 Per Dr. Jennet Maduro (see hospital course): - current suspicion is recent decadron use for thrombocytopenia; no obvious source of infection  DM2 Blood sugars under good control off of any medications SSI can be added as warranted  Nutrition Tube feeds have been on hold but per GI they can be restarted  Goals for care Patient apparently has no family, his next of kin contact is his friend [name] Patient is clearly very ill and goals for care discussion needs to be had although he is rather too ill to undertake that right now.   Copied and pasted from hospital course, Dr. Venetia Maxon: Esophageal adenocarcinoma - diagnosed 04/20/23; s/p chemoradiation - PEG in place for nutrition  - recent PET 08/03/23 showing hypermetabolic lesion distal esophagus; - had tele eval with CTS, Dr. 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  Principal Problem:   Coagulopathy (HCC) Active Problems:   Acute GI bleeding     Samuel [name] Orma Flaming, [facility]  If 7PM-7AM, please contact night-coverage [email or web address]   LOS: 2 days

## 2023-09-17 NOTE — Progress Notes (Signed)
 Weekend cross coverage note for Dr. Lance Coon. [address] Subjective: States his mouth is dry.  Objective: Vital signs in last 24 hours: Temp:  [97.6 F (36.4 C)-98.9 F (37.2 C)] 97.6 F (36.4 C) (03/16 1046) Pulse Rate:  [39-112] 63 (03/16 1046) Resp:  [11-30] 17 (03/16 1046) BP: (89-121)/(39-70) 108/65 (03/16 1046) SpO2:  [89 %-100 %] 96 % (03/16 1046) Weight:  [64.3 kg] 64.3 kg (03/16 0704) Weight change:  Last BM Date : 09/16/23  PE: Ill-appearing GENERAL: Mild pallor  ABDOMEN: PEG tube in place, nontender, bowel sounds audible EXTREMITIES: No deformity  Lab Results: Results for orders placed or performed during the hospital encounter of 09/15/23 (from the past 48 hours)  ABO/Rh     Status: None   Collection Time: 09/15/23 12:00 PM  Result Value Ref Range   ABO/RH(D)      O POS Performed at [facility], [address] [place], [place] [address]   Urinalysis, Routine w reflex microscopic -Urine, Clean Catch     Status: None   Collection Time: 09/15/23 12:15 PM  Result Value Ref Range   Color, Urine YELLOW YELLOW   APPearance CLEAR CLEAR   Specific Gravity, Urine 1.019 1.005 - 1.030   pH 5.0 5.0 - 8.0   Glucose, UA NEGATIVE NEGATIVE mg/dL   Hgb urine dipstick NEGATIVE NEGATIVE   Bilirubin Urine NEGATIVE NEGATIVE   Ketones, ur NEGATIVE NEGATIVE mg/dL   Protein, ur NEGATIVE NEGATIVE mg/dL   Nitrite NEGATIVE NEGATIVE   Leukocytes,Ua NEGATIVE NEGATIVE    Comment: Performed at [facility], [address] [place], [place] [address]  Prepare platelet pheresis     Status: None (Preliminary result)   Collection Time: 09/15/23 12:16 PM  Result Value Ref Range   Unit Number [id number]    Blood Component Type PSORALEN TREATED    Unit division 00    Status of Unit ISSUED    Transfusion Status      OK TO TRANSFUSE Performed at [facility], [address] [place], [place] [address]   Blood culture (routine x 2)      Status: None (Preliminary result)   Collection Time: 09/15/23  4:30 PM   Specimen: BLOOD RIGHT HAND  Result Value Ref Range   Specimen Description      BLOOD RIGHT HAND Performed at [facility], [address] [place], [place] [address]    Special Requests      BOTTLES DRAWN AEROBIC AND ANAEROBIC Blood Culture results may not be optimal due to an inadequate volume of blood received in culture bottles Performed at [facility], [address] [place], [place] [address]    Culture      NO GROWTH 2 DAYS Performed at [facility], [address], [place], [place] [address]    Report Status PENDING   Blood culture (routine x 2)     Status: None (Preliminary result)   Collection Time: 09/15/23  4:35 PM   Specimen: BLOOD LEFT ARM  Result Value Ref Range   Specimen Description      BLOOD LEFT ARM Performed at [facility], [address], [place], [place] [address]    Special Requests      BOTTLES DRAWN AEROBIC AND ANAEROBIC Blood Culture results may not be optimal due to an inadequate volume of blood received in culture bottles Performed at [facility], [address], [place], [place] [address]  Culture      NO GROWTH 2 DAYS Performed at [facility], [address], [place], [place] [address]    Report Status PENDING   CBC with Differential     Status: Abnormal   Collection Time: 09/15/23  5:07 PM  Result Value Ref Range   WBC 23.3 (H) 4.0 - 10.5 K/uL   RBC 3.22 (L) 4.22 - 5.81 MIL/uL   Hemoglobin 10.1 (L) 13.0 - 17.0 g/dL   HCT 60.4 (L) 54.0 - 98.1 %   MCV 101.6 (H) 80.0 - 100.0 fL   MCH 31.4 26.0 - 34.0 pg   MCHC 30.9 30.0 - 36.0 g/dL   RDW 19.1 47.8 - 29.5 %   Platelets 55 (L) 150 - 400 K/uL    Comment: Immature Platelet Fraction may be clinically indicated, consider ordering this additional test [id number] CONSISTENT WITH PREVIOUS RESULT REPEATED TO VERIFY POST TRANSFUSION SPECIMEN    nRBC 0.7 (H)  0.0 - 0.2 %   Neutrophils Relative % 81 %   Neutro Abs 18.8 (H) 1.7 - 7.7 K/uL   Lymphocytes Relative 2 %   Lymphs Abs 0.4 (L) 0.7 - 4.0 K/uL   Monocytes Relative 11 %   Monocytes Absolute 2.6 (H) 0.1 - 1.0 K/uL   Eosinophils Relative 0 %   Eosinophils Absolute 0.0 0.0 - 0.5 K/uL   Basophils Relative 0 %   Basophils Absolute 0.1 0.0 - 0.1 K/uL   Immature Granulocytes 6 %   Abs Immature Granulocytes 1.42 (H) 0.00 - 0.07 K/uL    Comment: Performed at [facility], [address] [place], [place] [address]  DIC Panel ONCE - STAT     Status: Abnormal   Collection Time: 09/15/23  5:08 PM  Result Value Ref Range   Prothrombin Time 36.1 (H) 11.4 - 15.2 seconds   INR 3.6 (H) 0.8 - 1.2    Comment: (NOTE) INR goal varies based on device and disease states.    aPTT 40 (H) 24 - 36 seconds    Comment:        IF BASELINE aPTT IS ELEVATED, SUGGEST PATIENT RISK ASSESSMENT BE USED TO DETERMINE APPROPRIATE ANTICOAGULANT THERAPY.    Fibrinogen >800 (H) 210 - 475 mg/dL    Comment: REPEATED TO VERIFY   D-Dimer, Quant 16.38 (H) 0.00 - 0.50 ug/mL-FEU    Comment: (NOTE) At the manufacturer cut-off value of 0.5 g/mL FEU, this assay has a negative predictive value of 95-100%.This assay is intended for use in conjunction with a clinical pretest probability (PTP) assessment model to exclude pulmonary embolism (PE) and deep venous thrombosis (DVT) in outpatients suspected of PE or DVT. Results should be correlated with clinical presentation. CORRECTED ON 03/14 AT 1839: PREVIOUSLY REPORTED AS 16.38    Platelets 55 (L) 150 - 400 K/uL    Comment: SPECIMEN CHECKED FOR CLOTS Immature Platelet Fraction may be clinically indicated, consider ordering this additional test [id number] REPEATED TO VERIFY    Smear Review NO SCHISTOCYTES SEEN     Comment: Performed at [facility], [address], [place], [place] [address]  Immature Platelet Fraction     Status: None    Collection Time: 09/15/23  5:08 PM  Result Value Ref Range   Immature Platelet Fraction 4.2 1.2 - 8.6 %    Comment: Performed at [facility], [address], [place], [place] [address]  Prepare platelet pheresis     Status: None (Preliminary result)   Collection  Time: 09/15/23  6:45 PM  Result Value Ref Range   Unit Number [id number]    Blood Component Type PLTP1 PSORALEN TREATED    Unit division 00    Status of Unit ISSUED    Transfusion Status      OK TO TRANSFUSE Performed at [facility], [address] [place], [place] [address]   Prepare fresh frozen plasma     Status: None (Preliminary result)   Collection Time: 09/15/23  6:45 PM  Result Value Ref Range   Unit Number [id number]    Blood Component Type THW PLS APHR    Unit division A0    Status of Unit ISSUED    Transfusion Status OK TO TRANSFUSE    Unit Number [id number]    Blood Component Type THW PLS APHR    Unit division A0    Status of Unit ISSUED    Transfusion Status OK TO TRANSFUSE    Unit Number [id number]    Blood Component Type THW PLS APHR    Unit division A0    Status of Unit ISSUED    Transfusion Status      OK TO TRANSFUSE Performed at [facility], [address], [place], [place] [address]   MRSA Next Gen by PCR, Nasal     Status: None   Collection Time: 09/15/23  8:43 PM   Specimen: Nasal Mucosa; Nasal Swab  Result Value Ref Range   MRSA by PCR Next Gen NOT DETECTED NOT DETECTED    Comment: (NOTE) The GeneXpert MRSA Assay (FDA approved for NASAL specimens only), is one component of a comprehensive MRSA colonization surveillance program. It is not intended to diagnose MRSA infection nor to guide or monitor treatment for MRSA infections. Test performance is not FDA approved in patients less than 9 years old. Performed at [facility], [address], [place], [place] [address]   Hemoglobin and hematocrit,  blood     Status: Abnormal   Collection Time: 09/15/23  8:55 PM  Result Value Ref Range   Hemoglobin 10.8 (L) 13.0 - 17.0 g/dL   HCT 55.7 (L) 32.2 - 02.5 %    Comment: Performed at [facility], [address] [place], [place] [address]  Magnesium     Status: None   Collection Time: 09/15/23  8:55 PM  Result Value Ref Range   Magnesium 2.2 1.7 - 2.4 mg/dL    Comment: Performed at [facility], [address], [place], [place] [address]  CBC with Differential/Platelet     Status: Abnormal   Collection Time: 09/16/23  2:32 PM  Result Value Ref Range   WBC 14.4 (H) 4.0 - 10.5 K/uL   RBC 2.95 (L) 4.22 - 5.81 MIL/uL   Hemoglobin 9.4 (L) 13.0 - 17.0 g/dL   HCT 83.1 (L) 51.7 - 61.6 %   MCV 100.7 (H) 80.0 - 100.0 fL   MCH 31.9 26.0 - 34.0 pg   MCHC 31.6 30.0 - 36.0 g/dL   RDW 07.3 71.0 - 62.6 %   Platelets 33 (L) 150 - 400 K/uL    Comment: SPECIMEN CHECKED FOR CLOTS Immature Platelet Fraction may be clinically indicated, consider ordering this additional test [id number] REPEATED TO VERIFY PLATELET COUNT CONFIRMED BY SMEAR    nRBC 1.0 (H) 0.0 - 0.2 %   Neutrophils Relative % 93 %   Neutro Abs 13.4 (H) 1.7 - 7.7 K/uL   Lymphocytes Relative 0 %  Lymphs Abs 0.0 (L) 0.7 - 4.0 K/uL   Monocytes Relative 4 %   Monocytes Absolute 0.6 0.1 - 1.0 K/uL   Eosinophils Relative 1 %   Eosinophils Absolute 0.1 0.0 - 0.5 K/uL   Basophils Relative 0 %   Basophils Absolute 0.0 0.0 - 0.1 K/uL   Metamyelocytes Relative 1 %   Myelocytes 1 %   Abs Immature Granulocytes 0.30 (H) 0.00 - 0.07 K/uL    Comment: Performed at [facility], [address], [place], [place] [address]  Protime-INR     Status: Abnormal   Collection Time: 09/16/23  2:32 PM  Result Value Ref Range   Prothrombin Time 22.5 (H) 11.4 - 15.2 seconds   INR 2.0 (H) 0.8 - 1.2    Comment: (NOTE) INR goal varies based on device and disease states. Performed at [facility], [address] [place], [place] [address]   APTT     Status: None   Collection Time: 09/16/23  2:32 PM  Result Value Ref Range   aPTT 34 24 - 36 seconds    Comment: Performed at [facility], [address] [place], [place] [address]  Comprehensive metabolic panel     Status: Abnormal   Collection Time: 09/16/23  2:32 PM  Result Value Ref Range   Sodium 140 135 - 145 mmol/L   Potassium 3.9 3.5 - 5.1 mmol/L   Chloride 106 98 - 111 mmol/L   CO2 26 22 - 32 mmol/L   Glucose, Bld 92 70 - 99 mg/dL    Comment: Glucose reference range applies only to samples taken after fasting for at least 8 hours.   BUN 31 (H) 8 - 23 mg/dL   Creatinine, Ser 2.13 0.61 - 1.24 mg/dL   Calcium 8.8 (L) 8.9 - 10.3 mg/dL   Total Protein 5.2 (L) 6.5 - 8.1 g/dL   Albumin 2.9 (L) 3.5 - 5.0 g/dL   AST 73 (H) 15 - 41 U/L   ALT 38 0 - 44 U/L   Alkaline Phosphatase 71 38 - 126 U/L   Total Bilirubin 0.9 0.0 - 1.2 mg/dL   GFR, Estimated >08 >65 mL/min    Comment: (NOTE) Calculated using the CKD-EPI Creatinine Equation (2021)    Anion gap 8 5 - 15    Comment: Performed at [facility], [address], [place], [place] [address]  Fibrinogen     Status: Abnormal   Collection Time: 09/16/23  2:32 PM  Result Value Ref Range   Fibrinogen 60 (LL) 210 - 475 mg/dL    Comment: CRITICAL RESULT CALLED TO, READ BACK BY AND VERIFIED WITH: OSIPENKO, Z @ 1614 ON 09/16/23 CAL (NOTE) Fibrinogen results may be underestimated in patients receiving thrombolytic therapy. Performed at [facility], [address] [place], [place] [address]   D-dimer, quantitative     Status: Abnormal   Collection Time: 09/16/23  2:32 PM  Result Value Ref Range   D-Dimer, Quant >20.00 (H) 0.00 - 0.50 ug/mL-FEU    Comment: (NOTE) At the manufacturer cut-off value of 0.5 g/mL FEU, this assay has a negative predictive value of 95-100%.This assay is intended for  use in conjunction with a clinical pretest probability (PTP) assessment model to exclude pulmonary embolism (PE) and deep venous thrombosis (DVT) in outpatients suspected of PE or DVT. Results should be correlated with clinical presentation. Performed at [facility], [address], [place], [place] [address]  Iron and TIBC     Status: Abnormal   Collection Time: 09/16/23  2:32 PM  Result Value Ref Range   Iron 43 (L) 45 - 182 ug/dL   TIBC 301 601 - 093 ug/dL   Saturation Ratios 16 (L) 17.9 - 39.5 %   UIBC 224 ug/dL    Comment: Performed at [facility], [address] [place], [place] [address]  Prepare cryoprecipitate     Status: None (Preliminary result)   Collection Time: 09/16/23  5:15 PM  Result Value Ref Range   Unit Number [id number]    Blood Component Type CRYO,THAWED    Unit division 00    Status of Unit ISSUED    Transfusion Status      OK TO TRANSFUSE Performed at [facility], [address], [place], [place] [address]   Prepare platelet pheresis     Status: None (Preliminary result)   Collection Time: 09/16/23  5:15 PM  Result Value Ref Range   Unit Number [id number]    Blood Component Type PLTP3 PSORALEN TREATED    Unit division 00    Status of Unit ISSUED    Transfusion Status      OK TO TRANSFUSE Performed at [facility], [address] [place], [place] [address]   Glucose, capillary     Status: None   Collection Time: 09/16/23  6:40 PM  Result Value Ref Range   Glucose-Capillary 84 70 - 99 mg/dL    Comment: Glucose reference range applies only to samples taken after fasting for at least 8 hours.  CBC with Differential/Platelet     Status: Abnormal   Collection Time: 09/17/23  2:44 AM  Result Value Ref Range   WBC 13.4 (H) 4.0 - 10.5 K/uL   RBC 2.70 (L) 4.22 - 5.81 MIL/uL   Hemoglobin 8.4 (L) 13.0 - 17.0 g/dL   HCT 85.4 (L) 62.7 - 03.5 %   MCV 103.3 (H) 80.0 -  100.0 fL   MCH 31.1 26.0 - 34.0 pg   MCHC 30.1 30.0 - 36.0 g/dL   RDW 00.9 38.1 - 82.9 %   Platelets 28 (LL) 150 - 400 K/uL    Comment: SPECIMEN CHECKED FOR CLOTS Immature Platelet Fraction may be clinically indicated, consider ordering this additional test [id number] REPEATED TO VERIFY THIS CRITICAL RESULT HAS VERIFIED AND BEEN CALLED TO JANELLE GARLAND, RN BY MEGAN HAYES ON 03 16 2025 AT 0329, AND HAS BEEN READ BACK. CRITICAL RESULT VERIFIED    nRBC 0.9 (H) 0.0 - 0.2 %   Neutrophils Relative % 79 %   Neutro Abs 10.5 (H) 1.7 - 7.7 K/uL   Lymphocytes Relative 2 %   Lymphs Abs 0.3 (L) 0.7 - 4.0 K/uL   Monocytes Relative 11 %   Monocytes Absolute 1.5 (H) 0.1 - 1.0 K/uL   Eosinophils Relative 2 %   Eosinophils Absolute 0.3 0.0 - 0.5 K/uL   Basophils Relative 0 %   Basophils Absolute 0.0 0.0 - 0.1 K/uL   Immature Granulocytes 6 %   Abs Immature Granulocytes 0.80 (H) 0.00 - 0.07 K/uL    Comment: Performed at [facility], [address], [place], [place] [address]  Protime-INR     Status: Abnormal   Collection Time: 09/17/23  2:44 AM  Result Value Ref Range   Prothrombin Time 22.6 (H) 11.4 - 15.2 seconds   INR 2.0 (H) 0.8 - 1.2    Comment: (NOTE) INR goal varies  based on device and disease states. Performed at [facility], [address] [place], [place] [address]   APTT     Status: None   Collection Time: 09/17/23  2:44 AM  Result Value Ref Range   aPTT 34 24 - 36 seconds    Comment: Performed at [facility], [address], [place], [place] [address]  Comprehensive metabolic panel     Status: Abnormal   Collection Time: 09/17/23  2:44 AM  Result Value Ref Range   Sodium 141 135 - 145 mmol/L   Potassium 3.5 3.5 - 5.1 mmol/L   Chloride 109 98 - 111 mmol/L   CO2 26 22 - 32 mmol/L   Glucose, Bld 110 (H) 70 - 99 mg/dL    Comment: Glucose reference range applies only to samples taken after fasting for at least 8 hours.    BUN 26 (H) 8 - 23 mg/dL   Creatinine, Ser 0.98 0.61 - 1.24 mg/dL   Calcium 8.5 (L) 8.9 - 10.3 mg/dL   Total Protein 4.7 (L) 6.5 - 8.1 g/dL   Albumin 2.5 (L) 3.5 - 5.0 g/dL   AST 57 (H) 15 - 41 U/L   ALT 33 0 - 44 U/L   Alkaline Phosphatase 63 38 - 126 U/L   Total Bilirubin 0.7 0.0 - 1.2 mg/dL   GFR, Estimated >11 >91 mL/min    Comment: (NOTE) Calculated using the CKD-EPI Creatinine Equation (2021)    Anion gap 6 5 - 15    Comment: Performed at [facility], [address], [place], [place] [address]  DIC Panel Tomorrow AM 0500     Status: Abnormal   Collection Time: 09/17/23  2:45 AM  Result Value Ref Range   Prothrombin Time 22.4 (H) 11.4 - 15.2 seconds   INR 1.9 (H) 0.8 - 1.2    Comment: (NOTE) INR goal varies based on device and disease states.    aPTT 35 24 - 36 seconds   Fibrinogen 86 (LL) 210 - 475 mg/dL    Comment: CRITICAL RESULT CALLED TO, READ BACK BY AND VERIFIED WITH: Aura Dials, RN AT [phone] 09/17/23 MH (NOTE) Fibrinogen results may be underestimated in patients receiving thrombolytic therapy.    D-Dimer, Quant >20.00 (H) 0.00 - 0.50 ug/mL-FEU    Comment: (NOTE) At the manufacturer cut-off value of 0.5 g/mL FEU, this assay has a negative predictive value of 95-100%.This assay is intended for use in conjunction with a clinical pretest probability (PTP) assessment model to exclude pulmonary embolism (PE) and deep venous thrombosis (DVT) in outpatients suspected of PE or DVT. Results should be correlated with clinical presentation.    Platelets 27 (LL) 150 - 400 K/uL    Comment: SPECIMEN CHECKED FOR CLOTS Immature Platelet Fraction may be clinically indicated, consider ordering this additional test [id number] REPEATED TO VERIFY THIS CRITICAL RESULT HAS VERIFIED AND BEEN CALLED TO JANELLE GARLAND, RN BY MEGAN HAYES ON 03 16 2025 AT 0331, AND HAS BEEN READ BACK. CRITICAL RESULT VERIFIED    Smear Review NO SCHISTOCYTES SEEN     Comment:  Performed at [facility], [address] [place], [place] [address]  Prepare RBC (crossmatch)     Status: None   Collection Time: 09/17/23  6:39 AM  Result Value Ref Range   Order Confirmation      ORDER PROCESSED BY BLOOD BANK Performed at [facility], [address], [place], [place] [address]   Prepare  cryoprecipitate     Status: None (Preliminary result)   Collection Time: 09/17/23  9:56 AM  Result Value Ref Range   Unit Number [id number]    Blood Component Type CRYO,THAWED    Unit division 00    Status of Unit ISSUED    Transfusion Status      OK TO TRANSFUSE Performed at [facility], 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 Black tarry stool. History of esophageal cancer. * Tracking Code: BO * EXAM: CTA ABDOMEN AND PELVIS WITHOUT AND WITH CONTRAST TECHNIQUE: Multidetector CT imaging of the abdomen and pelvis was performed using the standard protocol during bolus administration of intravenous contrast. Multiplanar reconstructed images and MIPs were obtained and reviewed to evaluate the vascular anatomy. RADIATION DOSE REDUCTION: This exam was performed according to the departmental dose-optimization program which includes automated exposure control, adjustment of the mA and/or kV according to patient size and/or use of iterative reconstruction technique. CONTRAST:  OMNIPAQUE IOHEXOL 350 MG/ML SOLN COMPARISON:  CT scan abdomen and pelvis from 04/19/2023. FINDINGS: VASCULAR Aorta: Normal caliber aorta without aneurysm, dissection, vasculitis or significant stenosis. Celiac: Patent without evidence of aneurysm, dissection, vasculitis or significant stenosis. SMA: Patent without evidence of aneurysm, dissection, vasculitis or significant stenosis. Note is made of small area of a fat  stranding in the left upper abdomen mesentery (series 6, images 55-58), which contains several 1-2 mm diameter sized vessels within. The area is supplied by the SMA/SMV branches (jejunal and ileal branches). This is new since the prior CT scan from 04/19/2023. No associated adjacent abnormal bowel wall thickening or focal mass. 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 However, this is most likely not a cause of patient's GI bleeding. Renals: Both renal arteries are patent without evidence of aneurysm, dissection, vasculitis, fibromuscular dysplasia or significant stenosis. IMA: Patent without evidence of aneurysm, dissection, vasculitis or significant stenosis. Inflow: There is ectasia of the left internal iliac artery measuring up to 9 mm in diameter. The inflow arteries are otherwise patent without evidence of aneurysm, dissection, vasculitis or significant stenosis. Proximal Outflow: Bilateral common femoral and visualized portions of the superficial and profunda femoral arteries are patent without evidence of aneurysm, dissection, vasculitis or significant stenosis. Veins: No obvious venous abnormality within the limitations of this arterial phase study. Review of the MIP images confirms the above findings. NON-VASCULAR Lower chest: There is partially imaged at least trace left pleural effusion with associated pleural thickening. The lung bases are otherwise clear. No left pleural effusion. The heart is normal in size. No pericardial effusion. Hepatobiliary: The liver is normal in size. Non-cirrhotic configuration. No suspicious mass. No intrahepatic or extrahepatic bile duct dilation. Redemonstration of an approximately 5 mm calcification along the dependent wall of the gallbladder at the fold, which may represent adherent gallstone versus focal gallbladder wall calcification. No significant interval change since the prior study. No imaging evidence of acute cholecystitis. Otherwise  normal gallbladder wall. No pericholecystic inflammatory changes. Pancreas: Redemonstration of an approximately 1.1 x 1.6 cm nonenhancing hypoattenuating lesion adjacent to the pancreatic head. The lesion is essentially unchanged since the prior study. No main pancreatic duct dilation. 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 Further evaluation with MRI abdomen as per pancreatic mass protocol is recommended, unless recently performed. Otherwise unremarkable pancreas. No peripancreatic fat stranding. Spleen:  Within normal limits. No focal lesion. Adrenals/Urinary Tract: Adrenal glands are unremarkable. No suspicious renal mass. Redemonstration of bilobed predominantly exophytic cyst arising from the left kidney, anteriorly measuring 5.7 x 8.2 cm. No nephroureterolithiasis or obstructive uropathy on either side. Unremarkable urinary bladder. Stomach/Bowel: Note is made of midline gastrostomy tube. No disproportionate dilation of the small or large bowel loops. No evidence of abnormal bowel wall thickening or inflammatory changes. The appendix is unremarkable. There are multiple diverticula mainly in the left hemi colon, without imaging signs of diverticulitis. Vascular/Lymphatic: No ascites or pneumoperitoneum. No abdominal or pelvic lymphadenopathy, by size criteria. No aneurysmal dilation of the major abdominal arteries. There are mild peripheral atherosclerotic vascular calcifications of the aorta and its major branches. Reproductive: Enlarged prostate. Symmetric seminal vesicles. Other: There is a tiny fat containing umbilical hernia. The soft tissues and abdominal wall are otherwise unremarkable. Musculoskeletal: No suspicious osseous lesions. There are mild - moderate multilevel degenerative changes in the visualized spine. IMPRESSION: VASCULAR *There is a small area of fat stranding in the left upper abdomen, which contains several 1-2 mm sized  vessels within. 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 NON-VASCULAR *No acute inflammatory process identified within the abdomen or pelvis. *There is a stable, approximately 1.1 x 1.6 cm nonenhancing hypoattenuating lesion adjacent to the pancreatic head. 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 Further evaluation with MRI abdomen as per pancreatic mass protocol is recommended, unless recently performed. *Both multiple other nonacute observations, as described above. Aortic Atherosclerosis (ICD10-I70.0).  Bone Electronically Signed   By: Jules Schick M.D.   On: 09/15/2023 15:13    Medications: I have reviewed the patient's current medications.  Assessment: Esophageal cancer, status post chemoradiation IR guided percutaneous gastrostomy tube in place DIC  Plan: Although hemoglobin has dropped from 9.4-8.4, patient has not had a bowel movement since admission, there has been no report of coffee-ground emesis or hematochezia. BUN is downtrending at 26 today His platelet count continues to drop and is 27 INR today is 1.9 with PT of 22.4 So far patient has received 3 units of FFP, 3 units of platelet and is receiving second unit of cryoprecipitate. Currently on full liquid diet. Okay to resume feeding via percutaneous gastrostomy tube. Continue pantoprazole 40 mg every 12 hours. Continue sucralfate 1 g / 10 mL suspension every 6 hours. I have sent an epic chat message to patient's hospitalist and Dr. Myna Hidalgo, no plans for endoscopic intervention as with low platelet, EGD can cause mucosal trauma and cause a worsening bleeding. Recommend continuing conservative management. Please recall GI if needed.  Kerin Salen, MD 09/17/2023, 11:10 AM

## 2023-09-17 NOTE — Progress Notes (Signed)
 eLink Physician-Brief Progress Note Patient Name: Dashan Chizmar DOB: 03-21-1946 MRN: [id number]   Date of Service  09/17/2023  HPI/Events of Note  26 beat run of V. tach, K3.5 this morning without repletion received Lasix afterwards  eICU Interventions  Will order Kcl Check magnesium and potassium in morning     Intervention Category Intermediate Interventions: Arrhythmia - evaluation and management  Evon Dejarnett 09/17/2023, 9:33 PM

## 2023-09-17 NOTE — Progress Notes (Signed)
 eLink Physician-Brief Progress Note Patient Name: Samuel [name] DOB: Dec 03, 1945 MRN: [id number]   Date of Service  09/17/2023  HPI/Events of Note  Platelets came back at 27, down from 33 No report of active bleed overnight Fibrinogen 86  eICU Interventions  Ordered 1 unit platelets Will defer cryo transfusion as no active bleed Discussed with BSRN     Intervention Category Intermediate Interventions: Coagulopathy - evaluation and management  Darl Pikes 09/17/2023, 3:45 AM

## 2023-09-17 NOTE — Progress Notes (Signed)
 Platelets have dropped again and 1 unit being transfused. Hemoglobin slowly drifting down from 12-8.4 Fibrinogen is low again and will transfuse 1 more unit of cryo He is transferred to [facility] service, would recommend discussing with GI about endoscopy.  Would treat him like a low-grade DIC  PCCM will remain available as needed  Samuel [name] V. Vassie Loll MD

## 2023-09-17 NOTE — Progress Notes (Signed)
 I still really question whether or not he had a DIC.  His coagulation parameters improved incredibly quickly GIST with some FFP.  Today, his INR is 1.9.  His PTT is 35.  The fibrinogen is down a little bit at 86.  His D-dimer is still high.  His CBC shows white cell count 13.4.  Hemoglobin 8.4.  Platelet count 28,000.  I think we are going to have to give him 1 unit of blood.  This will also help with coagulation.  In my opinion, I really think he needs to have endoscopy.  I cannot just say that he bled from his cancer.  From his last scans, he is in remission.  As such, I would not think that there is a cancer for him to bleed from.  Again I think would be very helpful to know if anything else is going on.  From my perspective, I think it be safe for him to have an endoscopy.  If he needs to have a platelet transfusion beforehand, that would be all right.  His iron is quite low.  Iron saturation was 16%.  I will give him some IV iron.  Apparently, there is no obvious bleeding right now.  He has had no fever.  He has had no cough.  He has had no abdominal pain.  His vital signs show temperature of 98.1.  Pulse 73.  Blood pressure 89/43.  His lungs are clear bilaterally.  Cardiac exam regular rate rhythm.  Abdomen is soft.  Bowel sounds are somewhat decreased.  He does have the feeding tube that is intact.  Extremity shows no clubbing, cyanosis or edema.  Neurological exam is nonfocal.  Samuel [name] has a GI bleeding.  Again is not clear whether this came from.  I had, again, I do not believe this is from his cancer since his last scan shows cancer to be in remission.  I really appreciate the wonderful care that he has been getting.  He is very impressed with the ICU staff.  I am really not surprised.   Samuel Bach, MD  Proverbs 3:5-6

## 2023-09-18 DIAGNOSIS — D689 Coagulation defect, unspecified: Secondary | ICD-10-CM | POA: Diagnosis not present

## 2023-09-18 LAB — COMPREHENSIVE METABOLIC PANEL
ALT: 32 U/L (ref 0–44)
AST: 44 U/L — ABNORMAL HIGH (ref 15–41)
Albumin: 2.6 g/dL — ABNORMAL LOW (ref 3.5–5.0)
Alkaline Phosphatase: 71 U/L (ref 38–126)
Anion gap: 10 (ref 5–15)
BUN: 23 mg/dL (ref 8–23)
CO2: 22 mmol/L (ref 22–32)
Calcium: 8.1 mg/dL — ABNORMAL LOW (ref 8.9–10.3)
Chloride: 109 mmol/L (ref 98–111)
Creatinine, Ser: 0.83 mg/dL (ref 0.61–1.24)
GFR, Estimated: >60 mL/min (ref >60)
Glucose, Bld: 167 mg/dL — ABNORMAL HIGH (ref 70–99)
Potassium: 3.9 mmol/L (ref 3.5–5.1)
Sodium: 141 mmol/L (ref 135–145)
Total Bilirubin: 1.2 mg/dL (ref 0.0–1.2)
Total Protein: 4.9 g/dL — ABNORMAL LOW (ref 6.5–8.1)

## 2023-09-18 LAB — MAGNESIUM
Magnesium: 2.3 mg/dL (ref 1.7–2.4)
Magnesium: 2 mg/dL (ref 1.7–2.4)

## 2023-09-18 LAB — BPAM CRYOPRECIPITATE
Blood Product Expiration Date: 202503152352
Blood Product Expiration Date: 202503161613
ISSUE DATE / TIME: 202503151813
ISSUE DATE / TIME: 202503161023
Unit Type and Rh: 5100
Unit Type and Rh: 5100

## 2023-09-18 LAB — TYPE AND SCREEN
ABO/RH(D): O POS
Antibody Screen: NEGATIVE
Unit division: 0

## 2023-09-18 LAB — CBC WITH DIFFERENTIAL/PLATELET
Abs Immature Granulocytes: 0.6 10*3/uL — ABNORMAL HIGH (ref 0.00–0.07)
Basophils Absolute: 0 10*3/uL (ref 0.0–0.1)
Basophils Relative: 0 %
Eosinophils Absolute: 0.6 10*3/uL — ABNORMAL HIGH (ref 0.0–0.5)
Eosinophils Relative: 3 %
HCT: 37.5 % — ABNORMAL LOW (ref 39.0–52.0)
Hemoglobin: 11.2 g/dL — ABNORMAL LOW (ref 13.0–17.0)
Lymphocytes Relative: 1 %
Lymphs Abs: 0.2 10*3/uL — ABNORMAL LOW (ref 0.7–4.0)
MCH: 30.9 pg (ref 26.0–34.0)
MCHC: 29.9 g/dL — ABNORMAL LOW (ref 30.0–36.0)
MCV: 103.6 fL — ABNORMAL HIGH (ref 80.0–100.0)
Monocytes Absolute: 0.2 10*3/uL (ref 0.1–1.0)
Monocytes Relative: 1 %
Myelocytes: 3 %
Neutro Abs: 18.6 10*3/uL — ABNORMAL HIGH (ref 1.7–7.7)
Neutrophils Relative %: 92 %
Platelets: 34 10*3/uL — ABNORMAL LOW (ref 150–400)
RBC: 3.62 MIL/uL — ABNORMAL LOW (ref 4.22–5.81)
RDW: 16.5 % — ABNORMAL HIGH (ref 11.5–15.5)
WBC: 20.2 10*3/uL — ABNORMAL HIGH (ref 4.0–10.5)
nRBC: 1 % — ABNORMAL HIGH (ref 0.0–0.2)

## 2023-09-18 LAB — PROTIME-INR
INR: 2 — ABNORMAL HIGH (ref 0.8–1.2)
Prothrombin Time: 23.3 s — ABNORMAL HIGH (ref 11.4–15.2)

## 2023-09-18 LAB — PREPARE PLATELET PHERESIS
Unit division: 0
Unit division: 0
Unit division: 0
Unit division: 0

## 2023-09-18 LAB — PREPARE FRESH FROZEN PLASMA

## 2023-09-18 LAB — PREPARE CRYOPRECIPITATE
Unit division: 0
Unit division: 0

## 2023-09-18 LAB — BPAM RBC
Blood Product Expiration Date: 202504122359
ISSUE DATE / TIME: 202503161204
Unit Type and Rh: 5100

## 2023-09-18 LAB — BPAM PLATELET PHERESIS
Blood Product Expiration Date: 202503172359
Blood Product Expiration Date: 202503152359
Blood Product Expiration Date: 202503182359
Blood Product Expiration Date: 202503192359
ISSUE DATE / TIME: 202503141303
ISSUE DATE / TIME: 202503142305
ISSUE DATE / TIME: 202503160718
Unit Type and Rh: 5100
Unit Type and Rh: 7300
Unit Type and Rh: 202503182359
Unit Type and Rh: 5100
Unit Type and Rh: 5100

## 2023-09-18 LAB — BPAM FFP
Blood Product Expiration Date: 202503172359
Blood Product Expiration Date: 202503192359
Blood Product Expiration Date: 202503192359
ISSUE DATE / TIME: 202503142047
ISSUE DATE / TIME: 202503150250
ISSUE DATE / TIME: 202503150435
Unit Type and Rh: 5100
Unit Type and Rh: 5100
Unit Type and Rh: 9500

## 2023-09-18 LAB — PHOSPHORUS: Phosphorus: 1.8 mg/dL — ABNORMAL LOW (ref 2.5–4.6)

## 2023-09-18 LAB — APTT: aPTT: 40 s — ABNORMAL HIGH (ref 24–36)

## 2023-09-18 LAB — GLUCOSE, CAPILLARY
Glucose-Capillary: 116 mg/dL — ABNORMAL HIGH (ref 70–99)
Glucose-Capillary: 121 mg/dL — ABNORMAL HIGH (ref 70–99)

## 2023-09-18 MED ORDER — ORAL CARE MOUTH RINSE: 15.0000 mL | OROMUCOSAL | Status: DC | PRN

## 2023-09-18 MED ORDER — OSMOLITE 1.5 CAL PO LIQD
1000.0000 mL | ORAL | Status: DC
  Administered 2023-09-18 – 2023-09-20 (×2): 1000 mL
  Filled 2023-09-18 (×3): qty 1000

## 2023-09-18 MED ORDER — PROSOURCE TF20 ENFIT COMPATIBL EN LIQD
60.0000 mL | Freq: Every day | ENTERAL | Status: DC
  Administered 2023-09-18 – 2023-09-20 (×3): 60 mL
  Filled 2023-09-18 (×3): qty 60

## 2023-09-18 NOTE — Progress Notes (Addendum)
 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  Patient had been noted to have decline in functional status as an outpatient for the past month.  Patient was started on vancomycin, cefepime and Zosyn and GI was consulted.  CTA abdomen pelvis was negative for source of bleeding.  Of note, patient's Eliquis had been discontinued last week as an outpatient by Dr. Parke Poisson due to worsening thrombocytopenia, platelets 24, with concern for DIC.  He was also started on steroids at the time.  Review of platelets at this time shows mild decline from 41,00 to  34,000.   Subjective:  Patient was seen and examined at bedside today.  Patient complains of some pain in the left foot calcaneum.  Denies any shortness of breath.  Review of labs show mild decline in platelet count   Objective: Vitals:   09/18/23 0800 09/18/23 0900 09/18/23 1000 09/18/23 1200  BP: 123/60 (!) 125/46 (!) 126/51   Pulse: (!) 48 79 93   Resp: 18 18 16    Temp: 98 F (36.7 C)   (!) 97.4 F (36.3 C)  TempSrc: Oral   Oral  SpO2: 95% 95% 93%   Weight:      Height:        Intake/Output Summary (Last 24 hours) at 09/18/2023 1404 Last data filed at 09/18/2023 4259 Gross per 24 hour  Intake 1705.69 ml  Output 900 ml  Net 805.69 ml   Filed Weights   09/16/23 0500 09/17/23 0704 09/18/23 0702  Weight: 70.9 kg 64.3 kg 60.4 kg     Exam:  General: Pale elderly man lying in bed, looks weak, no respiratory distress Eyes: sclera anicteric, conjuctiva mild injection bilaterally CVS: S1-S2, regular  Respiratory:  decreased air entry bilaterally secondary to decreased inspiratory  effort, rales at bases  GI: NABS, soft, NT  LE: Normal muscle mass   Data Reviewed:  Basic Metabolic Panel: Recent Labs  Lab 09/13/23 1336 09/15/23 1030 09/15/23 2055 09/16/23 1432 09/17/23 0244 09/18/23 0416  NA 135 137  --  140 141 141  K 4.7 4.4  --  3.9 3.5 3.9  CL 105 105  --  106 109 109  CO2 20* 24  --  26 26 22   GLUCOSE 173* 106*  --  92 110* 167*  BUN 37* 47*  --  31* 26* 23  CREATININE 0.90 0.84  --  1.00 0.90 0.83  CALCIUM 9.1 9.3  --  8.8* 8.5* 8.1*  MG  --   --  2.2  --   --  2.3    CBC: Recent Labs  Lab 09/15/23 1030 09/15/23 1707 09/15/23 1708 09/15/23 2055 09/16/23 1432 09/17/23 0244 09/17/23 0245 09/17/23 1647 09/18/23 0416  WBC 26.5* 23.3*  --   --  14.4* 13.4*  --  15.3* 20.2*  NEUTROABS 23.1* 18.8*  --   --  13.4* 10.5*  --   --  18.6*  HGB 12.4* 10.1*  --  10.8* 9.4* 8.4*  --  10.5* 11.2*  HCT 39.4 32.7*  --  34.2* 29.7* 27.9*  --  34.4* 37.5*  MCV 99.2 101.6*  --   --  100.7* 103.3*  --  101.5* 103.6*  PLT 12* 55*   < >  --  33* 28* 27* 41* 34*   < > = values in this interval not displayed.     Scheduled Meds:  sodium chloride   Intravenous Once   Chlorhexidine Gluconate Cloth  6 each Topical Daily   nystatin  5 mL Oral QID   pantoprazole (PROTONIX) IV  40 mg Intravenous Q12H   [START ON 09/20/2023] romiPLOStim  2 mcg/kg Subcutaneous Once   sucralfate  1 g Oral Q6H   Continuous Infusions:  cefTRIAXone (ROCEPHIN)  IV Stopped (09/17/23 2249)     Assessment & Plan:   Melena ABLA Had melena for 3 to 4 days prior to admission with multiple stools a day Hemoglobin on admission was 13, it has drifted down slowly to 8.4 today Patient seen by GI who ordered CTA abdomen pelvis which was negative for bleeding source No instrumentation/endoscopy is planned by GI at present Per Dr. Jennet Maduro (see hospital course): - likely spontaneous occult bleeding from underlying DIC (see lab workup) especially with PLTC 12k on admission. -Current  hemoglobin is 11.2. - Oncologist/hematologist following the patient and the still feels that patient might benefit from endoscopic intervention to rule out bleeding. - Patient currently receiving iron transfusion. -GI has cleared the patient to start tube feeds.  Thrombocytopenia Possible DIC Worsening thrombocytopenia had been followed as an outpatient by per Dr. Mosetta Putt,  Of note patient's platelets were 300 K in December 2024 and have dropped steadily since DIC panel was positive as an outpatient on 3/10 and Dr. Marshall Cork discontinued Eliquis and started steroids Since admission fibrinogen had increased to > 8000 but now down to 86 Per Dr. Jennet Maduro (see hospital course): - DIC panel positive per Dr. Mosetta Putt on 3/10 and now panel certainly worse on 3/14 on admission; not sure reasoning behind Dr. Myna Hidalgo not thinking this is DIC, as labs do suggest DIC ongoing/worsening (even with the repeat DIC panel on 3/14) - received 3 FFP and 2 platelets since admission - started on Nplate per oncology as no response to steroid previously tried also - continue trending CBC and will continue with necessary transfusions; monitor for further evidence of bleeding/melena, etc -Platelet count declined from 41,000 -  34,000. - CBC in AM  Leukocytosis No clear source of infection Was empirically started on vancomycin and cefepime Urine and blood cultures are negative to date Now on ceftriaxone from 3/14 Per Dr. Jennet Maduro (see hospital course): - current suspicion is recent decadron use for thrombocytopenia; no obvious source of infection -Cossette count went up from 15,000-20,000.  Culture still continue to be negative - Check procalcitonin  DM2 Blood sugars under good control off of any medications SSI can be added as warranted  Nutrition Tube feeds have been on hold but per GI they can be restarted  Goals for care Patient apparently has no family, his next of kin contact is his friend Samuel [name] Patient is  clearly very ill and goals for care discussion needs to be had although he is rather too ill to undertake that right now.   Copied and pasted from hospital course, Dr. Venetia Maxon: Esophageal adenocarcinoma - diagnosed 04/20/23; s/p chemoradiation - PEG in place for nutrition  - recent PET 08/03/23 showing hypermetabolic lesion distal esophagus; - had tele eval with CTS, Dr. 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  Principal Problem:   Coagulopathy (HCC) Active Problems:   Acute GI bleeding     Samuel [name], [facility]  If 7PM-7AM, please contact night-coverage [email or web address]   LOS: 3 days

## 2023-09-18 NOTE — Progress Notes (Signed)
 Initial Nutrition Assessment  DOCUMENTATION CODES:   Non-severe (moderate) malnutrition in context of chronic illness  INTERVENTION:  - Initiate tube feeding via PEG: Osmolite 1.5 at 55 ml/h (1320 ml per day) *Start at 60mL/hr and advance by 10mL Q12H  Prosource TF20 60 ml daily Provides 2060 kcal, 103 gm protein, 1006 ml free water daily  - Monitor magnesium, potassium, and phosphorus BID for at least 3 days, MD to replete as needed, as pt is at risk for refeeding syndrome.  - Full liquid diet per MD.   - Monitor weight trends.    NUTRITION DIAGNOSIS:   Moderate Malnutrition related to chronic illness as evidenced by moderate fat depletion, moderate muscle depletion.  GOAL:   Patient will meet greater than or equal to 90% of their needs  MONITOR:   PO intake, Diet advancement, Labs, Weight trends, TF tolerance  REASON FOR ASSESSMENT:   Consult Other (Comment) (pt has PEG tube)  ASSESSMENT:   78yo M PMH esophageal cancer s/p chemoradiation, s/p PEG placement October 2024, and cellulitis who presented or CC melena, associated weakness and "hunger pains." Admitted for upper GI bleed and concern for DIC.   Patient in bed at time of visit. Difficult to understand but able to obtain all nutrition history.   UBW reported to be 160-170# and patient endorses weight loss over the past couple months. Per EMR, weight without significant changes from October to patient's last visit with his Oncologist on 3/10. ON that day, he was weighed at 160#. This admission he has been weighed daily and each day his weight has decreased.   Date/Time Weight Weight in lbs  09/18/23 0702 60.4 kg 133.16 lbs  09/17/23 0704 64.3 kg 141.76 lbs  09/16/23 0500 70.9 kg 156.31 lbs  09/15/23 0945 73 kg 160.94 lbs   Question accuracy given such drastic daily changes. Difficult to fully assess weight trends at this time. However, patient believes he is as low as 125#. Will need to monitor weight trends  closely.   Patient noted to be followed by outpatient cancer RD, last visit 2/24. At that time, patient reported giving himself 4 cartons of Osmolite 1.5 as well as eating more foods such as drinkable yogurt, egg salad, cream of wheat, and soups.   Upon talking with patient today, he reports he has not been able to eat well or given full feedings for more than 1 week. Has had almost nothing for <1 week. States he has been having pain and this has affected his intake of food/TF. Also having some trouble swallowing which has limited him.   Had had SLP eval 3/15 and cleared for a DYS 1 diet with thin liquids. He currently remains on full liquids per MD. Patient eager to have tube feeds restarted due to not getting hardly anything for 1 week.   Reached out to MD to discuss restarting tube feeds. MD okay to start tube feeds via PEG today. Will plan to start with continuous feeds and advance slowly due to patient report of inadequate intake >1 week and possible weight loss recently, making patient at risk for refeeding syndrome. Will also plan to meet 100% of estimated needs due to poor oral intake.   Medications reviewed and include: -  Labs reviewed:  -   NUTRITION - FOCUSED PHYSICAL EXAM:  Flowsheet Row Most Recent Value  Orbital Region Moderate depletion  Upper Arm Region Moderate depletion  Thoracic and Lumbar Region Mild depletion  Buccal Region Mild depletion  [place] Region  Moderate depletion  Clavicle Bone Region Mild depletion  Clavicle and Acromion Bone Region Moderate depletion  Scapular Bone Region Unable to assess  Dorsal Hand No depletion  Patellar Region Mild depletion  Anterior Thigh Region Mild depletion  Posterior Calf Region Mild depletion  Edema (RD Assessment) None  Hair Reviewed  Eyes Reviewed  Mouth Reviewed  Skin Reviewed  Nails Reviewed       Diet Order:   Diet Order             Diet full liquid Room service appropriate? Yes; Fluid consistency: Thin   Diet effective now                   EDUCATION NEEDS:  Education needs have been addressed  Skin:  Skin Assessment: Skin Integrity Issues: Skin Integrity Issues:: Stage II Stage II: Mid Sacrum  Last BM:  3/15 - type 5  Height:  Ht Readings from Last 1 Encounters:  09/15/23 5\' 6"  (1.676 m)   Weight:  Wt Readings from Last 1 Encounters:  09/18/23 60.4 kg   Ideal Body Weight:  64.55 kg  BMI:  Body mass index is 21.49 kg/m.  Estimated Nutritional Needs:  Kcal:  2000-2200 kcals Protein:  90-110 grams Fluid:  >/= 2L    Shelle Iron RD, LDN Contact via Secure Chat.

## 2023-09-19 DIAGNOSIS — E440 Moderate protein-calorie malnutrition: Secondary | ICD-10-CM | POA: Insufficient documentation

## 2023-09-19 DIAGNOSIS — D689 Coagulation defect, unspecified: Secondary | ICD-10-CM | POA: Diagnosis not present

## 2023-09-19 LAB — GLUCOSE, CAPILLARY
Glucose-Capillary: 131 mg/dL — ABNORMAL HIGH (ref 70–99)
Glucose-Capillary: 149 mg/dL — ABNORMAL HIGH (ref 70–99)
Glucose-Capillary: 153 mg/dL — ABNORMAL HIGH (ref 70–99)
Glucose-Capillary: 158 mg/dL — ABNORMAL HIGH (ref 70–99)
Glucose-Capillary: 165 mg/dL — ABNORMAL HIGH (ref 70–99)
Glucose-Capillary: 169 mg/dL — ABNORMAL HIGH (ref 70–99)

## 2023-09-19 LAB — COMPREHENSIVE METABOLIC PANEL
ALT: 29 U/L (ref 0–44)
ALT: 25 U/L (ref 0–44)
AST: 36 U/L (ref 15–41)
AST: 32 U/L (ref 15–41)
Albumin: 2.3 g/dL — ABNORMAL LOW (ref 3.5–5.0)
Albumin: 2.1 g/dL — ABNORMAL LOW (ref 3.5–5.0)
Alkaline Phosphatase: 79 U/L (ref 38–126)
Alkaline Phosphatase: 68 U/L (ref 38–126)
Anion gap: 8 (ref 5–15)
Anion gap: 7 (ref 5–15)
BUN: 32 mg/dL — ABNORMAL HIGH (ref 8–23)
BUN: 32 mg/dL — ABNORMAL HIGH (ref 8–23)
CO2: 22 mmol/L (ref 22–32)
CO2: 22 mmol/L (ref 22–32)
Calcium: 8.4 mg/dL — ABNORMAL LOW (ref 8.9–10.3)
Calcium: 8.1 mg/dL — ABNORMAL LOW (ref 8.9–10.3)
Chloride: 112 mmol/L — ABNORMAL HIGH (ref 98–111)
Chloride: 113 mmol/L — ABNORMAL HIGH (ref 98–111)
Creatinine, Ser: 0.79 mg/dL (ref 0.61–1.24)
Creatinine, Ser: 0.75 mg/dL (ref 0.61–1.24)
GFR, Estimated: >60 mL/min (ref >60)
GFR, Estimated: >60 mL/min (ref >60)
Glucose, Bld: 158 mg/dL — ABNORMAL HIGH (ref 70–99)
Glucose, Bld: 164 mg/dL — ABNORMAL HIGH (ref 70–99)
Potassium: 3.6 mmol/L (ref 3.5–5.1)
Potassium: 3.6 mmol/L (ref 3.5–5.1)
Sodium: 142 mmol/L (ref 135–145)
Sodium: 142 mmol/L (ref 135–145)
Total Bilirubin: 1.4 mg/dL — ABNORMAL HIGH (ref 0.0–1.2)
Total Bilirubin: 0.9 mg/dL (ref 0.0–1.2)
Total Protein: 4.6 g/dL — ABNORMAL LOW (ref 6.5–8.1)
Total Protein: 4.2 g/dL — ABNORMAL LOW (ref 6.5–8.1)

## 2023-09-19 LAB — CBC
HCT: 34.4 % — ABNORMAL LOW (ref 39.0–52.0)
Hemoglobin: 10.7 g/dL — ABNORMAL LOW (ref 13.0–17.0)
MCH: 31 pg (ref 26.0–34.0)
MCHC: 31.1 g/dL (ref 30.0–36.0)
MCV: 99.7 fL (ref 80.0–100.0)
Platelets: 23 10*3/uL — CL (ref 150–400)
RBC: 3.45 MIL/uL — ABNORMAL LOW (ref 4.22–5.81)
RDW: 16.6 % — ABNORMAL HIGH (ref 11.5–15.5)
WBC: 20.2 10*3/uL — ABNORMAL HIGH (ref 4.0–10.5)
nRBC: 1 % — ABNORMAL HIGH (ref 0.0–0.2)

## 2023-09-19 LAB — CBC WITH DIFFERENTIAL/PLATELET
Abs Immature Granulocytes: 1.64 10*3/uL — ABNORMAL HIGH (ref 0.00–0.07)
Basophils Absolute: 0.1 10*3/uL (ref 0.0–0.1)
Basophils Relative: 1 %
Eosinophils Absolute: 0.5 10*3/uL (ref 0.0–0.5)
Eosinophils Relative: 3 %
HCT: 33.8 % — ABNORMAL LOW (ref 39.0–52.0)
Hemoglobin: 10.1 g/dL — ABNORMAL LOW (ref 13.0–17.0)
Immature Granulocytes: 8 %
Lymphocytes Relative: 1 %
Lymphs Abs: 0.3 10*3/uL — ABNORMAL LOW (ref 0.7–4.0)
MCH: 30.9 pg (ref 26.0–34.0)
MCHC: 29.9 g/dL — ABNORMAL LOW (ref 30.0–36.0)
MCV: 103.4 fL — ABNORMAL HIGH (ref 80.0–100.0)
Monocytes Absolute: 2.2 10*3/uL — ABNORMAL HIGH (ref 0.1–1.0)
Monocytes Relative: 11 %
Neutro Abs: 15.3 10*3/uL — ABNORMAL HIGH (ref 1.7–7.7)
Neutrophils Relative %: 76 %
Platelets: 23 10*3/uL — CL (ref 150–400)
RBC: 3.27 MIL/uL — ABNORMAL LOW (ref 4.22–5.81)
RDW: 16.6 % — ABNORMAL HIGH (ref 11.5–15.5)
Smear Review: DECREASED
WBC: 20.1 10*3/uL — ABNORMAL HIGH (ref 4.0–10.5)
nRBC: 1.1 % — ABNORMAL HIGH (ref 0.0–0.2)

## 2023-09-19 LAB — MAGNESIUM
Magnesium: 2 mg/dL (ref 1.7–2.4)
Magnesium: 2.3 mg/dL (ref 1.7–2.4)
Magnesium: 2.1 mg/dL (ref 1.7–2.4)

## 2023-09-19 LAB — PHOSPHORUS
Phosphorus: 1.8 mg/dL — ABNORMAL LOW (ref 2.5–4.6)
Phosphorus: 2.7 mg/dL (ref 2.5–4.6)

## 2023-09-19 LAB — PROTIME-INR
INR: 2.4 — ABNORMAL HIGH (ref 0.8–1.2)
Prothrombin Time: 26.6 s — ABNORMAL HIGH (ref 11.4–15.2)

## 2023-09-19 LAB — APTT: aPTT: 58 s — ABNORMAL HIGH (ref 24–36)

## 2023-09-19 MED ORDER — METOPROLOL TARTRATE 5 MG/5ML IV SOLN
INTRAVENOUS | Status: AC
  Administered 2023-09-19: 5 mg
  Filled 2023-09-19: qty 5

## 2023-09-19 MED ORDER — POTASSIUM PHOSPHATES 15 MMOLE/5ML IV SOLN
30.0000 mmol | Freq: Once | INTRAVENOUS | Status: AC
  Administered 2023-09-19: 30 mmol via INTRAVENOUS
  Filled 2023-09-19: qty 10

## 2023-09-19 NOTE — Progress Notes (Addendum)
 SLP Cancellation Note  Patient Details Name: Samuel [name] MRN: [id number] DOB: [birth year]   Cancelled treatment:        RN reports patient is tolerating intake without overtly coughing.  Note plans for palliative consult tomorrow and that patient has made himself DNR.  Depending on results of palliative meeting SLP follow-up may not be indicated.  Recommend small frequent amounts for comfort as long as patient is tolerating.  Rolena Infante, MS [facility] SLP Acute Rehab Services Office [phone]    Chales Abrahams 09/19/2023, 6:27 PM

## 2023-09-19 NOTE — Progress Notes (Signed)
 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  Patient had been noted to have decline in functional status as an outpatient for the Last month.  Patient was started on vancomycin, cefepime and Zosyn for suspected infection and GI was consulted.  CTA abdomen pelvis was negative for source of bleeding.   Of note, patient's Eliquis had been discontinued last week as an outpatient by Dr. Parke Poisson due to worsening thrombocytopenia, Platelets 24K, with concern for DIC.  He was also started on steroids at that time.  Review of platelets at this time shows mild decline from 41,00 to  34,000.   Assessment & Plan:   Principal Problem:   Coagulopathy (HCC) Active Problems:   Acute GI bleeding   Malnutrition of moderate degree   Acute blood loss anemia / Melena: He presented with melena for 3 to 4 days PTA. Hb at admission was 13, it has drifted down slowly to 8.4  Patient seen by GI who ordered CTA A/P which was negative for bleeding source. GI recommended no plan for endoscopy at this time. Oncologist/hematologist still feels that patient might benefit from endoscopic intervention to rule out bleeding. Patient currently receiving iron transfusion. GI has cleared the patient to start tube feeds. Hb remains stable.   Thrombocytopenia: Concern for DIC Patient has been following with Dr. Mosetta Putt,  Of note patient's platelets were 300 K in December 2024 and have dropped steadily since DIC panel was positive as an outpatient on 3/10 and Dr. Mosetta Putt discontinued Eliquis and started steroids Since admission fibrinogen had increased to > 8000 but now down to  86. He has received 3 FFP and 2 platelets since admission He was started on Nplate per oncology as no response to steroids. Continue trending CBC and will continue with necessary transfusions;  Monitor for further evidence of bleeding/melena, etc Platelet count declined from 41 -  34- 23.  Leukocytosis: No clear source of infection. He was empirically started on vancomycin and cefepime. Urine and blood cultures are negative to date. Now on ceftriaxone from 3/14. It could be due to steroids.  DM2: Blood sugars under good control off of any medications Continue SSI    Nutrition Tube feeds have been on hold but per GI they can be restarted.   Goals for care: Patient apparently has no family, his next of kin contact is his friend [name] Patient is clearly very ill and goals for care discussion needs to be done although he is rather too ill to undertake that right now.   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 PEG in place for nutrition. recent PET 08/03/23 showing hypermetabolic lesion distal esophagus; He had tele eval with CTS, Dr. 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  Oncology concerned for cancer dissemination possibly causing DIC and BM biopsy has been recommended to further investigate.   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  DVT prophylaxis: SCDs Code Status: DNR Family Communication: Friend at bed side Disposition Plan:    Status is: Inpatient Remains inpatient appropriate because: Severity of illness.    Consultants:  Oncology Palliative care  Procedures:  Antimicrobials: Anti-infectives  (From admission, onward)    Start     Dose/Rate Route Frequency Ordered Stop   09/16/23 1000  vancomycin (VANCOREADY) IVPB 1500 mg/300 mL  Status:  Discontinued        1,500 mg 150 mL/hr over 120 Minutes Intravenous Every 24 hours 09/15/23 1743 09/16/23 0951   09/15/23 2200  cefTRIAXone (ROCEPHIN) 2 g in sodium chloride 0.9 % 100 mL IVPB        2 g 200 mL/hr over 30 Minutes Intravenous Every 24 hours 09/15/23 1732     09/15/23 1600  ceFEPIme (MAXIPIME) 2 g in sodium chloride 0.9 % 100 mL IVPB        2 g 200 mL/hr over 30 Minutes Intravenous  Once 09/15/23 1546 09/15/23 1702   09/15/23 1600  metroNIDAZOLE (FLAGYL) IVPB 500 mg        500 mg 100 mL/hr over 60 Minutes Intravenous  Once 09/15/23 1546 09/15/23 1744   09/15/23 1600  vancomycin (VANCOCIN) IVPB 1000 mg/200 mL premix        1,000 mg 200 mL/hr over 60 Minutes Intravenous  Once 09/15/23 1546 09/15/23 1755       Subjective: Patient was seen and examined at bedside. Overnight events noted.   Patient reports doing better. Hemoglobin remains stable.  Platelet count is 23K.  Objective: Vitals:   09/19/23 0702 09/19/23 0800 09/19/23 0900 09/19/23 1000  BP:  127/67 103/64 (!) 102/47  Pulse:  (!) 59 (!) 106 (!) 101  Resp:  18 15 16   Temp:  97.6 F (36.4 C)    TempSrc:  Oral    SpO2:  96% 96% 95%  Weight: 72.1 kg     Height:        Intake/Output Summary (Last 24 hours) at 09/19/2023 1056 Last data filed at 09/19/2023 0803 Gross per 24 hour  Intake 758.17 ml  Output 850 ml  Net -91.83 ml   Filed Weights   09/17/23 0704 09/18/23 0702 09/19/23 0702  Weight: 64.3 kg 70.4 kg 72.1 kg    Examination:  General exam: Appears calm and comfortable, deconditioned, not in any acute distress. Respiratory system: CTA bilaterally. Respiratory effort normal.  RR 15 Cardiovascular system: S1 & S2 heard, RRR. No JVD, murmurs, rubs, gallops or clicks. No pedal edema. Gastrointestinal system: Abdomen is non distended, soft and nontender.   Normal bowel sounds heard. Central nervous system: Alert and oriented x 3. No focal neurological deficits. Extremities: Symmetric 5 x 5 power. Skin: No rashes, lesions or ulcers Psychiatry: Judgement and insight appear normal. Mood & affect appropriate.     Data Reviewed: I have personally reviewed following labs and imaging studies  CBC: Recent Labs  Lab 09/15/23 1707 09/15/23 1708 09/16/23 1432 09/17/23 0244 09/17/23 0245 09/17/23 1647 09/18/23 0416 09/19/23 0218 09/19/23 0623  WBC 23.3*  --  14.4* 13.4*  --  15.3* 20.2* 20.2* 20.1*  NEUTROABS 18.8*  --  13.4* 10.5*  --   --  18.6*  --  15.3*  HGB 10.1*   < > 9.4* 8.4*  --  10.5* 11.2* 10.7* 10.1*  HCT 32.7*   < > 29.7* 27.9*  --  34.4* 37.5* 34.4* 33.8*  MCV 101.6*  --  100.7* 103.3*  --  101.5* 103.6* 99.7 103.4*  PLT 55*   < > 33* 28* 27* 41* 34* 23* 23*   < > = values in this interval not displayed.   Basic Metabolic Panel: Recent Labs  Lab 09/15/23 2055 09/16/23 1432 09/17/23 0244 09/18/23 0416 09/18/23 1630 09/19/23 0218 09/19/23 0623  NA  --  140 141 141  --  142 142  K  --  3.9 3.5 3.9  --  3.6 3.6  CL  --  106 109 109  --  112* 113*  CO2  --  26 26 22   --  22 22  GLUCOSE  --  92 110* 167*  --  158* 164*  BUN  --  31* 26* 23  --  32* 32*  CREATININE  --  1.00 0.90 0.83  --  0.79 0.75  CALCIUM  --  8.8* 8.5* 8.1*  --  8.4* 8.1*  MG 2.2  --   --  2.3 2.0 2.0 2.3  PHOS  --   --   --   --  1.8*  --  1.8*   GFR: Estimated Creatinine Clearance: 69.8 mL/min (by C-G formula based on SCr of 0.75 mg/dL). Liver Function Tests: Recent Labs  Lab 09/16/23 1432 09/17/23 0244 09/18/23 0416 09/19/23 0218 09/19/23 0623  AST 73* 57* 44* 36 32  ALT 38 33 32 29 25  ALKPHOS 71 63 71 79 68  BILITOT 0.9 0.7 1.2 1.4* 0.9  PROT 5.2* 4.7* 4.9* 4.6* 4.2*  ALBUMIN 2.9* 2.5* 2.6* 2.3* 2.1*   No results for input(s): "LIPASE", "AMYLASE" in the last 168 hours. No results for input(s): "AMMONIA" in the last 168  hours. Coagulation Profile: Recent Labs  Lab 09/16/23 1432 09/17/23 0244 09/17/23 0245 09/18/23 0416 09/19/23 0623  INR 2.0* 2.0* 1.9* 2.0* 2.4*   Cardiac Enzymes: No results for input(s): "CKTOTAL", "CKMB", "CKMBINDEX", "TROPONINI" in the last 168 hours. BNP (last 3 results) No results for input(s): "PROBNP" in the last 8760 hours. HbA1C: No results for input(s): "HGBA1C" in the last 72 hours. CBG: Recent Labs  Lab 09/16/23 1840 09/18/23 1928 09/18/23 2246 09/19/23 0329 09/19/23 0722  GLUCAP 84 116* 121* 131* 158*   Lipid Profile: No results for input(s): "CHOL", "HDL", "LDLCALC", "TRIG", "CHOLHDL", "LDLDIRECT" in the last 72 hours. Thyroid Function Tests: No results for input(s): "TSH", "T4TOTAL", "FREET4", "T3FREE", "THYROIDAB" in the last 72 hours. Anemia Panel: Recent Labs    09/16/23 1432  TIBC 267  IRON 43*   Sepsis Labs: No results for input(s): "PROCALCITON", "LATICACIDVEN" in the last 168 hours.  Recent Results (from the past 240 hours)  Blood culture (routine x 2)     Status: None (Preliminary result)   Collection Time: 09/15/23  4:30 PM   Specimen: BLOOD RIGHT HAND  Result Value Ref Range Status   Specimen Description   Final    BLOOD RIGHT HAND Performed at [facility], [address] [place], [place] [address]    Special Requests   Final    BOTTLES DRAWN AEROBIC AND ANAEROBIC Blood Culture results may not be optimal due to an inadequate volume of blood received in culture bottles Performed at [facility], [address], [place], [place] [address]    Culture   Final    NO GROWTH 4 DAYS Performed at [facility], [address], [place], [place] [address]    Report Status PENDING  Incomplete  Blood culture (routine  x 2)     Status: None (Preliminary result)   Collection Time: 09/15/23  4:35 PM   Specimen: BLOOD LEFT ARM  Result Value Ref Range Status   Specimen Description   Final    BLOOD LEFT  ARM Performed at [facility], [address] [place], [place] [address]    Special Requests   Final    BOTTLES DRAWN AEROBIC AND ANAEROBIC Blood Culture results may not be optimal due to an inadequate volume of blood received in culture bottles Performed at [facility], [address], [place], [place] [address]    Culture   Final    NO GROWTH 4 DAYS Performed at [facility], [address] [place], [place] [address]    Report Status PENDING  Incomplete  MRSA Next Gen by PCR, Nasal     Status: None   Collection Time: 09/15/23  8:43 PM   Specimen: Nasal Mucosa; Nasal Swab  Result Value Ref Range Status   MRSA by PCR Next Gen NOT DETECTED NOT DETECTED Final    Comment: (NOTE) The GeneXpert MRSA Assay (FDA approved for NASAL specimens only), is one component of a comprehensive MRSA colonization surveillance program. It is not intended to diagnose MRSA infection nor to guide or monitor treatment for MRSA infections. Test performance is not FDA approved in patients less than 26 years old. Performed at [facility], [address], [place], [place] [address]     Radiology Studies: No results found.  Scheduled Meds:  sodium chloride   Intravenous Once   Chlorhexidine Gluconate Cloth  6 each Topical Daily   feeding supplement (PROSource TF20)  60 mL Per Tube Daily   nystatin  5 mL Oral QID   pantoprazole (PROTONIX) IV  40 mg Intravenous Q12H   [START ON 09/20/2023] romiPLOStim  2 mcg/kg Subcutaneous Once   sucralfate  1 g Oral Q6H   Continuous Infusions:  cefTRIAXone (ROCEPHIN)  IV Stopped (09/18/23 2219)   feeding supplement (OSMOLITE 1.5 CAL) 30 mL/hr at 09/19/23 0803   potassium PHOSPHATE IVPB (in mmol) 30 mmol (09/19/23 0922)     LOS: 4 days    Time spent: 50 mins    Willeen Niece, MD [facility]   If 7PM-7AM, please contact night-coverage

## 2023-09-19 NOTE — Plan of Care (Signed)

## 2023-09-19 NOTE — Progress Notes (Addendum)
       Overnight   NAME: Samuel [name] MRN: [id number] DOB : 1946/05/26    Date of Service   09/19/2023   HPI/Events of Note    Witnessed a HR of 200+ on monitoring.  Pt relieved at first by vagal maneuvers. A repeat HR of 180s with patient maintaining mentation and BP. Patient does express anxiety to RN.  Gabapentin and Oxy were administered along with O2 [place] at 2 Lpm Lopressor was given with noticeable improvement with rate now sustained at 100-115 Labs are pending    Interventions/ Plan   Labs Stat- Pending  Lopressor - 2.5 mg given PRN SPO2 maintain > than 92%      Update 0255 hrs.    Latest Reference Range & Units 09/19/23 02:18  COMPREHENSIVE METABOLIC PANEL  Rpt !  Sodium 135 - 145 mmol/L 142  Potassium 3.5 - 5.1 mmol/L 3.6  Chloride 98 - 111 mmol/L 112 (H)  CO2 22 - 32 mmol/L 22  Glucose 70 - 99 mg/dL 409 (H)  BUN 8 - 23 mg/dL 32 (H)  Creatinine 8.11 - 1.24 mg/dL 9.14  Calcium 8.9 - 78.2 mg/dL 8.4 (L)  Anion gap 5 - 15  8  Magnesium 1.7 - 2.4 mg/dL 2.0  Alkaline Phosphatase 38 - 126 U/L 79  Albumin 3.5 - 5.0 g/dL 2.3 (L)  AST 15 - 41 U/L 36  ALT 0 - 44 U/L 29  Total Protein 6.5 - 8.1 g/dL 4.6 (L)  !: Data is abnormal (H): Data is abnormally high (L): Data is abnormally low Rpt: View report in Results Review for more information    Latest Reference Range & Units 09/19/23 02:18  WBC 4.0 - 10.5 K/uL 20.2 (H)  RBC 4.22 - 5.81 MIL/uL 3.45 (L)  Hemoglobin 13.0 - 17.0 g/dL 95.6 (L)  HCT 21.3 - 08.6 % 34.4 (L)  MCV 80.0 - 100.0 fL 99.7  MCH 26.0 - 34.0 pg 31.0  MCHC 30.0 - 36.0 g/dL 57.8  RDW 46.9 - 62.9 % 16.6 (H)  Platelets 150 - 400 K/uL 23 (LL)  nRBC 0.0 - 0.2 % 1.0 (H)  (LL): Data is critically low (H): Data is abnormally high (L): Data is abnormally low     Chinita Greenland BSN MSNA MSN ACNPC-AG Acute Care Nurse Practitioner [facility] [place]

## 2023-09-20 ENCOUNTER — Inpatient Hospital Stay

## 2023-09-20 ENCOUNTER — Telehealth: Payer: Self-pay

## 2023-09-20 ENCOUNTER — Other Ambulatory Visit: Payer: Self-pay

## 2023-09-20 DIAGNOSIS — D689 Coagulation defect, unspecified: Secondary | ICD-10-CM | POA: Diagnosis not present

## 2023-09-20 DIAGNOSIS — D693 Immune thrombocytopenic purpura: Secondary | ICD-10-CM | POA: Diagnosis not present

## 2023-09-20 DIAGNOSIS — D696 Thrombocytopenia, unspecified: Secondary | ICD-10-CM | POA: Diagnosis not present

## 2023-09-20 DIAGNOSIS — Z8501 Personal history of malignant neoplasm of esophagus: Secondary | ICD-10-CM | POA: Diagnosis not present

## 2023-09-20 DIAGNOSIS — K922 Gastrointestinal hemorrhage, unspecified: Secondary | ICD-10-CM

## 2023-09-20 DIAGNOSIS — E440 Moderate protein-calorie malnutrition: Secondary | ICD-10-CM

## 2023-09-20 DIAGNOSIS — Z7189 Other specified counseling: Secondary | ICD-10-CM

## 2023-09-20 LAB — CBC WITH DIFFERENTIAL/PLATELET
Abs Immature Granulocytes: 2.07 10*3/uL — ABNORMAL HIGH (ref 0.00–0.07)
Abs Immature Granulocytes: 2.23 10*3/uL — ABNORMAL HIGH (ref 0.00–0.07)
Basophils Absolute: 0.1 10*3/uL (ref 0.0–0.1)
Basophils Absolute: 0.2 10*3/uL — ABNORMAL HIGH (ref 0.0–0.1)
Basophils Relative: 1 %
Basophils Relative: 1 %
Eosinophils Absolute: 0.7 10*3/uL — ABNORMAL HIGH (ref 0.0–0.5)
Eosinophils Absolute: 0.7 10*3/uL — ABNORMAL HIGH (ref 0.0–0.5)
Eosinophils Relative: 3 %
Eosinophils Relative: 3 %
HCT: 31.9 % — ABNORMAL LOW (ref 39.0–52.0)
HCT: 33.1 % — ABNORMAL LOW (ref 39.0–52.0)
Hemoglobin: 9.8 g/dL — ABNORMAL LOW (ref 13.0–17.0)
Hemoglobin: 10.1 g/dL — ABNORMAL LOW (ref 13.0–17.0)
Immature Granulocytes: 10 %
Immature Granulocytes: 10 %
Lymphocytes Relative: 2 %
Lymphocytes Relative: 2 %
Lymphs Abs: 0.5 10*3/uL — ABNORMAL LOW (ref 0.7–4.0)
Lymphs Abs: 0.5 10*3/uL — ABNORMAL LOW (ref 0.7–4.0)
MCH: 31.1 pg (ref 26.0–34.0)
MCH: 31.3 pg (ref 26.0–34.0)
MCHC: 30.7 g/dL (ref 30.0–36.0)
MCHC: 30.5 g/dL (ref 30.0–36.0)
MCV: 101.3 fL — ABNORMAL HIGH (ref 80.0–100.0)
MCV: 102.5 fL — ABNORMAL HIGH (ref 80.0–100.0)
Monocytes Absolute: 2.3 10*3/uL — ABNORMAL HIGH (ref 0.1–1.0)
Monocytes Absolute: 2.4 10*3/uL — ABNORMAL HIGH (ref 0.1–1.0)
Monocytes Relative: 11 %
Monocytes Relative: 11 %
Neutro Abs: 14.8 10*3/uL — ABNORMAL HIGH (ref 1.7–7.7)
Neutro Abs: 15.5 10*3/uL — ABNORMAL HIGH (ref 1.7–7.7)
Neutrophils Relative %: 73 %
Neutrophils Relative %: 73 %
Platelets: 15 10*3/uL — CL (ref 150–400)
Platelets: 14 10*3/uL — CL (ref 150–400)
RBC: 3.15 MIL/uL — ABNORMAL LOW (ref 4.22–5.81)
RBC: 3.23 MIL/uL — ABNORMAL LOW (ref 4.22–5.81)
RDW: 16.6 % — ABNORMAL HIGH (ref 11.5–15.5)
RDW: 16.5 % — ABNORMAL HIGH (ref 11.5–15.5)
WBC: 20.5 10*3/uL — ABNORMAL HIGH (ref 4.0–10.5)
WBC: 21.5 10*3/uL — ABNORMAL HIGH (ref 4.0–10.5)
nRBC: 0.7 % — ABNORMAL HIGH (ref 0.0–0.2)
nRBC: 0.8 % — ABNORMAL HIGH (ref 0.0–0.2)

## 2023-09-20 LAB — CALCIUM, IONIZED: Calcium, Ionized, Serum: 5.1 mg/dL (ref 4.5–5.6)

## 2023-09-20 LAB — BASIC METABOLIC PANEL
Anion gap: 8 (ref 5–15)
BUN: 31 mg/dL — ABNORMAL HIGH (ref 8–23)
CO2: 24 mmol/L (ref 22–32)
Calcium: 7.9 mg/dL — ABNORMAL LOW (ref 8.9–10.3)
Chloride: 109 mmol/L (ref 98–111)
Creatinine, Ser: 0.76 mg/dL (ref 0.61–1.24)
GFR, Estimated: >60 mL/min (ref >60)
Glucose, Bld: 145 mg/dL — ABNORMAL HIGH (ref 70–99)
Potassium: 3.9 mmol/L (ref 3.5–5.1)
Sodium: 141 mmol/L (ref 135–145)

## 2023-09-20 LAB — COMPREHENSIVE METABOLIC PANEL
ALT: 26 U/L (ref 0–44)
AST: 31 U/L (ref 15–41)
Albumin: 2.1 g/dL — ABNORMAL LOW (ref 3.5–5.0)
Alkaline Phosphatase: 82 U/L (ref 38–126)
Anion gap: 6 (ref 5–15)
BUN: 31 mg/dL — ABNORMAL HIGH (ref 8–23)
CO2: 24 mmol/L (ref 22–32)
Calcium: 7.9 mg/dL — ABNORMAL LOW (ref 8.9–10.3)
Chloride: 110 mmol/L (ref 98–111)
Creatinine, Ser: 0.73 mg/dL (ref 0.61–1.24)
GFR, Estimated: >60 mL/min (ref >60)
Glucose, Bld: 162 mg/dL — ABNORMAL HIGH (ref 70–99)
Potassium: 3.7 mmol/L (ref 3.5–5.1)
Sodium: 140 mmol/L (ref 135–145)
Total Bilirubin: 0.6 mg/dL (ref 0.0–1.2)
Total Protein: 4.3 g/dL — ABNORMAL LOW (ref 6.5–8.1)

## 2023-09-20 LAB — GLUCOSE, CAPILLARY
Glucose-Capillary: 182 mg/dL — ABNORMAL HIGH (ref 70–99)
Glucose-Capillary: 127 mg/dL — ABNORMAL HIGH (ref 70–99)
Glucose-Capillary: 129 mg/dL — ABNORMAL HIGH (ref 70–99)

## 2023-09-20 LAB — PHOSPHORUS: Phosphorus: 2.4 mg/dL — ABNORMAL LOW (ref 2.5–4.6)

## 2023-09-20 LAB — CULTURE, BLOOD (ROUTINE X 2)
Culture: NO GROWTH
Culture: NO GROWTH

## 2023-09-20 LAB — MAGNESIUM: Magnesium: 2.2 mg/dL (ref 1.7–2.4)

## 2023-09-20 LAB — APTT: aPTT: 50 s — ABNORMAL HIGH (ref 24–36)

## 2023-09-20 LAB — PROTIME-INR
INR: 2.3 — ABNORMAL HIGH (ref 0.8–1.2)
Prothrombin Time: 25.2 s — ABNORMAL HIGH (ref 11.4–15.2)

## 2023-09-20 MED ORDER — GLYCOPYRROLATE 1 MG PO TABS: 1.0000 mg | ORAL_TABLET | ORAL | Status: DC | PRN

## 2023-09-20 MED ORDER — LORAZEPAM 2 MG/ML IJ SOLN
1.0000 mg | INTRAMUSCULAR | Status: DC | PRN
  Administered 2023-09-20: 1 mg via INTRAVENOUS
  Filled 2023-09-20: qty 1

## 2023-09-20 MED ORDER — GLYCOPYRROLATE 0.2 MG/ML IJ SOLN: 0.2000 mg | INTRAMUSCULAR | Status: DC | PRN

## 2023-09-20 MED ORDER — POLYVINYL ALCOHOL 1.4 % OP SOLN: 1.0000 [drp] | Freq: Four times a day (QID) | OPHTHALMIC | Status: DC | PRN

## 2023-09-20 MED ORDER — LORAZEPAM 2 MG/ML PO CONC: 1.0000 mg | ORAL | Status: DC | PRN

## 2023-09-20 MED ORDER — OXYCODONE HCL 5 MG PO TABS
5.0000 mg | ORAL_TABLET | Freq: Once | ORAL | Status: AC
  Administered 2023-09-20: 5 mg via ORAL
  Filled 2023-09-20: qty 1

## 2023-09-20 MED ORDER — PANTOPRAZOLE SODIUM 40 MG IV SOLR
40.0000 mg | Freq: Two times a day (BID) | INTRAVENOUS | Status: DC
  Administered 2023-09-20 – 2023-09-21 (×2): 40 mg via INTRAVENOUS
  Filled 2023-09-20 (×2): qty 10

## 2023-09-20 MED ORDER — NYSTATIN 100000 UNIT/ML MT SUSP: 5.0000 mL | Freq: Four times a day (QID) | OROMUCOSAL | Status: DC

## 2023-09-20 MED ORDER — HALOPERIDOL LACTATE 2 MG/ML PO CONC: 0.5000 mg | ORAL | Status: DC | PRN

## 2023-09-20 MED ORDER — SODIUM CHLORIDE 0.9% IV SOLUTION: Freq: Once | INTRAVENOUS | Status: AC

## 2023-09-20 MED ORDER — PROSOURCE TF20 ENFIT COMPATIBL EN LIQD
60.0000 mL | Freq: Every day | ENTERAL | Status: DC
  Administered 2023-09-21: 60 mL
  Filled 2023-09-20: qty 60

## 2023-09-20 MED ORDER — HALOPERIDOL 0.5 MG PO TABS: 0.5000 mg | ORAL_TABLET | ORAL | Status: DC | PRN

## 2023-09-20 MED ORDER — SUCRALFATE 1 GM/10ML PO SUSP: 1.0000 g | Freq: Four times a day (QID) | ORAL | Status: DC

## 2023-09-20 MED ORDER — HALOPERIDOL LACTATE 5 MG/ML IJ SOLN: 0.5000 mg | INTRAMUSCULAR | Status: DC | PRN

## 2023-09-20 MED ORDER — LORAZEPAM 1 MG PO TABS: 1.0000 mg | ORAL_TABLET | ORAL | Status: DC | PRN

## 2023-09-20 MED ORDER — OSMOLITE 1.5 CAL PO LIQD
1000.0000 mL | ORAL | Status: DC
  Administered 2023-09-20: 1000 mL
  Filled 2023-09-20 (×3): qty 1000

## 2023-09-20 NOTE — Consult Note (Cosign Needed Addendum)
 Consultation Note Date: 09/20/2023   Patient Name: Samuel [name]  DOB: June 15, 1946  MRN: [id number]  Age / Sex: 78 y.o., male  PCP: [name], MD Referring Physician: Kathlen Mody, MD  Reason for Consultation: goals of care, pain control  HPI/Patient Profile: 78 y.o. male  with past medical history of esophageal cancer status post chemoradiation - (per review of most recent oncology note-there were concerns of bone marrow involvement of cancer causing DIC, the plan was for repeat PET scan and bone marrow biopsy this had not yet been completed prior to admission), thrombocytopenia, PE/DVT, heart failure with EF of 40 to 45%, admitted on 09/15/2023 with melena an leucocytosis.  He was started on empiric antibiotics, no infection source has been identified.  He has also been anemic.  GI was also consulted and they did not feel this was GI related bleeding.  Palliative medicine consulted for goals of care.  Primary Decision Maker PATIENT on review of chart he also has advanced care planning documents in place with HCPOA listed as Era Bumpers, and Everitt Amber as alternate HCPOA.  He also has a living will indicating his preference to not have artificial life support when he is in a terminal state.  Discussion: I have reviewed medical records including Care Everywhere, progress notes from this and prior admissions, labs and imaging, discussed with RN. Labs reviewed-he has platelets of 14 today, hemoglobin stable at 10.  Per attending provider progress note he has received 3 units of fresh frozen plasma and 2 units of platelets during this admission.  He is complaining of pain in his L foot. On eval his L toes are dusky, pedal pulse is weak. R toes appear normal. RN was able to locate peadal pulses with dopplar- both were irregular and weak.   On evaluation patient is awake and alert.  He is oriented.   His speech is tangential at times but he is overall able to participate in his goals of care as he demonstrates an understanding of his illness process and options for care presented to him.  His friend Floydene Flock is at the bedside.  Patient gives permission to speak in front of his friend and she is also listed under his contacts.  I introduced Palliative Medicine as specialized medical care for people living with serious illness. It focuses on providing relief from the symptoms and stress of a serious illness. The goal is to improve quality of life for both the patient and the family.  We discussed a brief life review of the patient.  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 He is very passionate about his work and impacting the future of the world. Knowledge and love make him happy. He is married and has cared for two disabled refugees from [place] who continue to reside with him. He and his family are cared for by friends in his community.   As far as functional and nutritional status - prior to this admission he has been in great  decline. His ambulation has become limited to bed to chair. He sleeps off and on throughout the day. He has lost weight despite ongoing tube feeding through his PEG. He is unable to take po food or drink. He does enjoy sips for comfort and uses suction to remove what he can't swallow. Especially cold coca cola. He is very very weak and deconditioned.   We discussed patient's current illness and what it means in the larger context of patient's on-going co-morbidities.  Natural disease trajectory and expectations at EOL were discussed.  I attempted to elicit values and goals of care important to the patient. His research and life work are what is most important to him- he has recently completed work that has been passed on for review by his friends in hopes it will contribute to the betterment of the state of the climate.   The difference  between aggressive medical intervention and comfort care was discussed and considered in light of the patient's goals of care.   Discussed transition to comfort measures only which includes stopping IV fluids, antibiotics, labs and providing symptom management for SOB, anxiety, nausea, vomiting, and other symptoms of dying.   Hospice and Palliative Care services outpatient were explained and offered.  Patient expressed his desire to transition to comfort measures only and request assistance from hospice. His friend is going to discuss with other friends about where is best location for him to receive care. It would be difficult for him to be well cared for in his own home. Hospice can eval for possible inpatient placement. His friends are going to discuss if they can care for him in one of their homes with hospice support. He may need to explore placement in SNF with hospice- he has Medicaid so this could be a viable option.    He is on bolus dose tube feedings at home. We discussed continuing these until he is either no longer awake or he decides that they are not contributing to his comfort or quality of life.   Questions and concerns were addressed. The family was encouraged to call with questions or concerns.      SUMMARY OF RECOMMENDATIONS -Transition to full comfort measures only -TOC order for referral to hospice- likely to needs assistance with disposition -Comfort orders placed -Concern for some ischemia or clot in L foot- however, given transition to comfort recommend to treat symptomatically with pain medication  Code Status/Advance Care Planning:   Code Status: Do not attempt resuscitation (DNR) - Comfort care    Prognosis:   < 4 weeks  Discharge Planning: To Be Determined  Primary Diagnoses: Present on Admission:  Coagulopathy (HCC)   Review of Systems  Constitutional:  Positive for activity change, appetite change and fatigue.    Physical Exam Vitals and nursing  note reviewed.  Constitutional:      Comments: frail  Cardiovascular:     Comments: Pedal pulses weak Musculoskeletal:     Comments: Diffuse anasarca, generalized weakness  Skin:    Findings: Bruising present.     Comments: Left toes dusky  Psychiatric:     Comments: Tangential at times     Vital Signs: BP 111/65   Pulse (!) 108   Temp 97.6 F (36.4 C) (Oral)   Resp 16   Ht 5\' 6"  (1.676 m)   Wt 72 kg   SpO2 96%   BMI 25.62 kg/m  Pain Scale: 0-10   Pain Score: 0-No pain   SpO2: SpO2: 96 %  O2 Device:SpO2: 96 % O2 Flow Rate: .O2 Flow Rate (L/min): 2 L/min  IO: Intake/output summary:  Intake/Output Summary (Last 24 hours) at 09/20/2023 1520 Last data filed at 09/20/2023 1431 Gross per 24 hour  Intake 1855.78 ml  Output 700 ml  Net 1155.78 ml    LBM: Last BM Date : 09/15/23 Baseline Weight: Weight: 73 kg Most recent weight: Weight: 72 kg       Thank you for this consult. Palliative medicine will continue to follow and assist as needed.  Time Total: 150 minutes Signed by: Ocie Bob, AGNP-C Palliative Medicine  Time includes:   Preparing to see the patient (e.g., review of tests) Obtaining and/or reviewing separately obtained history Performing a medically necessary appropriate examination and/or evaluation Counseling and educating the patient/family/caregiver Ordering medications, tests, or procedures Referring and communicating with other health care professionals (when not reported separately) Documenting clinical information in the electronic or other health record Independently interpreting results (not reported separately) and communicating results to the patient/family/caregiver Care coordination (not reported separately) Clinical documentation   Please contact Palliative Medicine Team phone at [phone] for questions and concerns.  For individual provider: See Loretha Stapler

## 2023-09-20 NOTE — Progress Notes (Addendum)
 [facility]                                                                               Samuel [name], is a 78 y.o. 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  Patient had been noted to have decline in functional status as an outpatient for the Last month.  Patient was started on vancomycin, cefepime and Zosyn for suspected infection and GI was consulted.  CTA abdomen pelvis was negative for source of bleeding.   Of note, patient's Eliquis had been discontinued last week as an outpatient by Dr. Parke Poisson due to worsening thrombocytopenia.  Patient's thrombocytopenia has been getting worse.  Today's platelets are around 14,000. 1 unit of platelets ordered.   Assessment & Plan    Assessment and Plan:   Acute blood loss anemia /melena prior to admission: Hemoglobin on admission was 13 and slowly trended down to 8.4 improved to 10 after prbc transfusions.  GI  consulted and recommended no plans for endoscopy in view of his thrombocytopenia.  Patient remains on IV PPI BID and hemoglobin remains stable around 10.  Continue with sucralfate 1 g every 6 hours.    H/o PE and DVT Was on eliquis, which is on hold for thrombocytopenia.    Hypophosphatemia:  Replaced.     Acute Thrombocytopenia:  S/p 3 units of FFP and 3 units of platelets so far.  Nplate infusion this morning.  Monitor for bleeding and melena.  Oncology on board.   Leukocytosis: No clear source of infection. He was empirically started on vancomycin and cefepime. Urine and blood cultures are negative to date. Now on  ceftriaxone from 3/14. It could be due to steroids.   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 PEG in place for nutrition. recent PET 08/03/23 showing hypermetabolic lesion distal esophagus; He had tele eval with CTS, Dr. 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  Oncology concerned for cancer dissemination possibly causing DIC and BM biopsy has been recommended to further investigate.   GOC.  Palliative care consulted for goc.    RN Pressure Injury Documentation: Pressure Injury 09/15/23 Sacrum Mid Stage 2 -  Partial thickness loss of dermis presenting as a shallow open injury with a red, pink wound bed without slough. Redness, small open area, (Active)  09/15/23 1800  Location: Sacrum  Location Orientation: Mid  Staging: Stage 2 -  Partial thickness loss of dermis presenting as a shallow open injury with a red, pink wound bed without slough.  Wound Description (Comments): Redness, small open area,  Present on Admission: Yes  Dressing Type Foam - Lift dressing to assess site every shift 09/20/23 0800    Malnutrition Type:  Nutrition Problem: Moderate Malnutrition Etiology: chronic  illness   Malnutrition Characteristics:  Signs/Symptoms: moderate fat depletion, moderate muscle depletion   Nutrition Interventions:  Interventions: Refer to RD note for recommendations, Tube feeding  Estimated body mass index is 25.62 kg/m as calculated from the following:   Height as of this encounter: 5\' 6"  (1.676 m).   Weight as of this encounter: 72 kg.  Code Status:  DVT Prophylaxis:  SCDs Start: 09/15/23 1629   Level of Care: Level of care: ICU Family Communication: none at bedside.   Disposition Plan:     Remains inpatient appropriate:  pending.   Procedures:  None.   Consultants:   Oncology PCCM.   Antimicrobials:   Anti-infectives (From admission, onward)    Start      Dose/Rate Route Frequency Ordered Stop   09/16/23 1000  vancomycin (VANCOREADY) IVPB 1500 mg/300 mL  Status:  Discontinued        1,500 mg 150 mL/hr over 120 Minutes Intravenous Every 24 hours 09/15/23 1743 09/16/23 0951   09/15/23 2200  cefTRIAXone (ROCEPHIN) 2 g in sodium chloride 0.9 % 100 mL IVPB  Status:  Discontinued        2 g 200 mL/hr over 30 Minutes Intravenous Every 24 hours 09/15/23 1732 09/19/23 1318   09/15/23 1600  ceFEPIme (MAXIPIME) 2 g in sodium chloride 0.9 % 100 mL IVPB        2 g 200 mL/hr over 30 Minutes Intravenous  Once 09/15/23 1546 09/15/23 1702   09/15/23 1600  metroNIDAZOLE (FLAGYL) IVPB 500 mg        500 mg 100 mL/hr over 60 Minutes Intravenous  Once 09/15/23 1546 09/15/23 1744   09/15/23 1600  vancomycin (VANCOCIN) IVPB 1000 mg/200 mL premix        1,000 mg 200 mL/hr over 60 Minutes Intravenous  Once 09/15/23 1546 09/15/23 1755        Medications  Scheduled Meds:  sodium chloride   Intravenous Once   sodium chloride   Intravenous Once   Chlorhexidine Gluconate Cloth  6 each Topical Daily   feeding supplement (PROSource TF20)  60 mL Per Tube Daily   nystatin  5 mL Oral QID   pantoprazole (PROTONIX) IV  40 mg Intravenous Q12H   sucralfate  1 g Oral Q6H   Continuous Infusions:  feeding supplement (OSMOLITE 1.5 CAL) 40 mL/hr at 09/20/23 1053   PRN Meds:.gabapentin, Muscle Rub, ondansetron (ZOFRAN) IV, mouth rinse, oxyCODONE, polyethylene glycol, traZODone    Subjective:   Samuel [name] was seen and examined today.  Pt reports not feeling great.   Objective:   Vitals:   09/20/23 0700 09/20/23 0800 09/20/23 0900 09/20/23 1000  BP: 112/83 (!) 149/64 131/64 (!) 119/48  Pulse: 67 (!) 102 (!) 108 84  Resp: 17 14 19 17   Temp:  (!) 97.5 F (36.4 C)    TempSrc:  Oral    SpO2: 96% 97% 96% 96%  Weight:      Height:        Intake/Output Summary (Last 24 hours) at 09/20/2023 1119 Last data filed at 09/20/2023 1053 Gross per 24 hour  Intake 1330.78  ml  Output 700 ml  Net 630.78 ml   Filed Weights   09/18/23 0702 09/19/23 0702 09/20/23 0500  Weight: 70.4 kg 72.1 kg 72 kg     Exam General exam: ill appearing gentleman,  Respiratory system: Clear to auscultation. Respiratory effort normal. Cardiovascular system: S1 & S2 heard, RRR. No JVD, Gastrointestinal system: Abdomen is nondistended, soft and nontender.  G tube in place.  Central nervous system: Alert and oriented. Extremities: no edema.  Skin: No rashes,  Psychiatry:  Mood & affect appropriate.     Data Reviewed:  I have personally reviewed following labs and imaging studies   CBC Lab Results  Component Value Date   WBC 21.5 (H) 09/20/2023   RBC 3.23 (L) 09/20/2023   HGB 10.1 (L) 09/20/2023   HCT 33.1 (L) 09/20/2023   MCV 102.5 (H) 09/20/2023   MCH 31.3 09/20/2023   PLT 14 (LL) 09/20/2023   MCHC 30.5 09/20/2023   RDW 16.5 (H) 09/20/2023   LYMPHSABS 0.5 (L) 09/20/2023   MONOABS 2.4 (H) 09/20/2023   EOSABS 0.7 (H) 09/20/2023   BASOSABS 0.2 (H) 09/20/2023     Last metabolic panel Lab Results  Component Value Date   NA 141 09/20/2023   K 3.9 09/20/2023   CL 109 09/20/2023   CO2 24 09/20/2023   BUN 31 (H) 09/20/2023   CREATININE 0.76 09/20/2023   GLUCOSE 145 (H) 09/20/2023   GFRNONAA >60 09/20/2023   GFRAA 83 06/15/2015   CALCIUM 7.9 (L) 09/20/2023   PHOS 2.4 (L) 09/20/2023   PROT 4.3 (L) 09/20/2023   ALBUMIN 2.1 (L) 09/20/2023   LABGLOB 2.3 08/09/2022   AGRATIO 1.8 08/09/2022   BILITOT 0.6 09/20/2023   ALKPHOS 82 09/20/2023   AST 31 09/20/2023   ALT 26 09/20/2023   ANIONGAP 8 09/20/2023    CBG (last 3)  Recent Labs    09/19/23 2349 09/20/23 0405 09/20/23 0745  GLUCAP 149* 182* 127*      Coagulation Profile: Recent Labs  Lab 09/17/23 0244 09/17/23 0245 09/18/23 0416 09/19/23 0623 09/20/23 0701  INR 2.0* 1.9* 2.0* 2.4* 2.3*     Radiology Studies: No results found.     Kathlen Mody M.D. [facility] 09/20/2023,  11:19 AM  Available via Epic secure chat 7am-7pm After 7 pm, please refer to night coverage provider listed on amion.   Addendum:  Patient was seen by palliative care, after further discussions with the patient and patient's friend, he was transitioned to comfort measures.

## 2023-09-20 NOTE — Progress Notes (Signed)
 Samuel [name]   DOB:May 22, 1946   WJ#:[id number]   AOZ#:[id number]  Medical oncology follow-up note  Subjective: Patient is well-known to me, under my care for esophageal cancer.  He was last seen in my office on September 13, 2023 for worsening thrombocytopenia.  He presented to hospital on September 15, 2023 with melena and leukocytosis.  He was very drowsy, not able to answer my questions when I saw him in the late afternoon.  He received Ativan before I saw him.  Per his notes, he has not been eating or drinking much.    Objective:  Vitals:   09/20/23 1500 09/20/23 1600  BP: 115/78   Pulse: (!) 122 (!) 111  Resp: 17 16  Temp:    SpO2: 92% 100%    Body mass index is 25.62 kg/m.  Intake/Output Summary (Last 24 hours) at 09/20/2023 1738 Last data filed at 09/20/2023 1431 Gross per 24 hour  Intake 1109 ml  Output 700 ml  Net 409 ml     Sclerae unicteric  Extensive ecchymosis on his hands and arms  No peripheral adenopathy  Lungs clear -- no rales or rhonchi  Heart regular rate and rhythm  Abdomen soft   MSK no focal spinal tenderness, no peripheral edema    CBG (last 3)  Recent Labs    09/20/23 0405 09/20/23 0745 09/20/23 1209  GLUCAP 182* 127* 129*     Labs:  Lab Results  Component Value Date   WBC 21.5 (H) 09/20/2023   HGB 10.1 (L) 09/20/2023   HCT 33.1 (L) 09/20/2023   MCV 102.5 (H) 09/20/2023   PLT 14 (LL) 09/20/2023   NEUTROABS 15.5 (H) 09/20/2023     Urine Studies No results for input(s): "UHGB", "CRYS" in the last 72 hours.  Invalid input(s): "UACOL", "UAPR", "USPG", "UPH", "UTP", "UGL", "UKET", "UBIL", "UNIT", "UROB", "ULEU", "UEPI", "UWBC", "URBC", "UBAC", "CAST", "UCOM", "BILUA"  Basic Metabolic Panel: Recent Labs  Lab 09/18/23 0416 09/18/23 1630 09/19/23 0218 09/19/23 0623 09/19/23 1903 09/20/23 0701 09/20/23 0834  NA 141  --  142 142  --  140 141  K 3.9  --  3.6 3.6  --  3.7 3.9  CL 109  --  112* 113*  --  110 109  CO2 22  --  22 22  --   24 24  GLUCOSE 167*  --  158* 164*  --  162* 145*  BUN 23  --  32* 32*  --  31* 31*  CREATININE 0.83  --  0.79 0.75  --  0.73 0.76  CALCIUM 8.1*  --  8.4* 8.1*  --  7.9* 7.9*  MG 2.3 2.0 2.0 2.3 2.1 2.2  --   PHOS  --  1.8*  --  1.8* 2.7 2.4*  --    GFR Estimated Creatinine Clearance: 69.8 mL/min (by C-G formula based on SCr of 0.76 mg/dL). Liver Function Tests: Recent Labs  Lab 09/17/23 0244 09/18/23 0416 09/19/23 0218 09/19/23 0623 09/20/23 0701  AST 57* 44* 36 32 31  ALT 33 32 29 25 26   ALKPHOS 63 71 79 68 82  BILITOT 0.7 1.2 1.4* 0.9 0.6  PROT 4.7* 4.9* 4.6* 4.2* 4.3*  ALBUMIN 2.5* 2.6* 2.3* 2.1* 2.1*   No results for input(s): "LIPASE", "AMYLASE" in the last 168 hours. No results for input(s): "AMMONIA" in the last 168 hours. Coagulation profile Recent Labs  Lab 09/17/23 0244 09/17/23 0245 09/18/23 0416 09/19/23 0623 09/20/23 0701  INR 2.0* 1.9* 2.0* 2.4* 2.3*  CBC: Recent Labs  Lab 09/17/23 0244 09/17/23 0245 09/18/23 0416 09/19/23 0218 09/19/23 0623 09/20/23 0701 09/20/23 0834  WBC 13.4*   < > 20.2* 20.2* 20.1* 20.5* 21.5*  NEUTROABS 10.5*  --  18.6*  --  15.3* 14.8* 15.5*  HGB 8.4*   < > 11.2* 10.7* 10.1* 9.8* 10.1*  HCT 27.9*   < > 37.5* 34.4* 33.8* 31.9* 33.1*  MCV 103.3*   < > 103.6* 99.7 103.4* 101.3* 102.5*  PLT 28*   < > 34* 23* 23* 15* 14*   < > = values in this interval not displayed.   Cardiac Enzymes: No results for input(s): "CKTOTAL", "CKMB", "CKMBINDEX", "TROPONINI" in the last 168 hours. BNP: Invalid input(s): "POCBNP" CBG: Recent Labs  Lab 09/19/23 1944 09/19/23 2349 09/20/23 0405 09/20/23 0745 09/20/23 1209  GLUCAP 169* 149* 182* 127* 129*   D-Dimer No results for input(s): "DDIMER" in the last 72 hours. Hgb A1c No results for input(s): "HGBA1C" in the last 72 hours. Lipid Profile No results for input(s): "CHOL", "HDL", "LDLCALC", "TRIG", "CHOLHDL", "LDLDIRECT" in the last 72 hours. Thyroid function studies No  results for input(s): "TSH", "T4TOTAL", "T3FREE", "THYROIDAB" in the last 72 hours.  Invalid input(s): "FREET3" Anemia work up No results for input(s): "VITAMINB12", "FOLATE", "FERRITIN", "TIBC", "IRON", "RETICCTPCT" in the last 72 hours. Microbiology Recent Results (from the past 240 hours)  Blood culture (routine x 2)     Status: None   Collection Time: 09/15/23  4:30 PM   Specimen: BLOOD RIGHT HAND  Result Value Ref Range Status   Specimen Description   Final    BLOOD RIGHT HAND Performed at [facility], [address] [place], [place] [address]    Special Requests   Final    BOTTLES DRAWN AEROBIC AND ANAEROBIC Blood Culture results may not be optimal due to an inadequate volume of blood received in culture bottles Performed at [facility], [address] [place], [place] [address]    Culture   Final    NO GROWTH 5 DAYS Performed at [facility], [address] [place], [place] [address]    Report Status 09/20/2023 FINAL  Final  Blood culture (routine x 2)     Status: None   Collection Time: 09/15/23  4:35 PM   Specimen: BLOOD LEFT ARM  Result Value Ref Range Status   Specimen Description   Final    BLOOD LEFT ARM Performed at [facility], [address] [place], [place] [address]    Special Requests   Final    BOTTLES DRAWN AEROBIC AND ANAEROBIC Blood Culture results may not be optimal due to an inadequate volume of blood received in culture bottles Performed at [facility], [address] [place], [place] [address]    Culture   Final    NO GROWTH 5 DAYS Performed at [facility], [address] [place], [place] [address]    Report Status 09/20/2023 FINAL  Final  MRSA Next Gen by PCR, Nasal     Status: None   Collection Time: 09/15/23  8:43 PM   Specimen: Nasal Mucosa; Nasal Swab  Result Value Ref Range Status   MRSA by PCR Next Gen NOT DETECTED NOT DETECTED Final    Comment: (NOTE) The  GeneXpert MRSA Assay (FDA approved for NASAL specimens only), is one component of a comprehensive MRSA colonization surveillance program. It is not intended to diagnose MRSA infection nor to guide  or monitor treatment for MRSA infections. Test performance is not FDA approved in patients less than 43 years old. Performed at [facility], [address], [place], [place] [address]       Studies:  No results found.  Assessment: 78 y.o. male   GI bleeding probably secondary to thrombocytopenia Worsening thrombocytopenia, secondary to DIC and probable metastatic cancer Blood loss anemia History of PE and DVT Leukocytosis, possible secondary to steroid last week Failure to thrive    Plan:  -Patient recently developed worsening thrombocytopenia and DIC, I suspect it is related to his metastatic esophageal cancer.  -Due to his rapid declining of functional status, I agree with hospice and comfort care.  If he is clinically stable, we can transfer him to residential hospice. -I spoke with his friend Maralyn Sago and answered her questions. -I will follow-up as needed.   [name], MD 09/20/2023  5:38 PM

## 2023-09-20 NOTE — TOC Progression Note (Addendum)
 Transition of Care [facility]) - Progression Note    Patient Details  Name: Samuel [name] MRN: [id number] Date of Birth: 11-26-1945  Transition of Care [facility]) CM/SW Contact  Howell Rucks, RN Phone Number: 09/20/2023, 3:19 PM  Clinical Narrative:   Teams chat received from Palliative Medicine Team, Ocie Bob, NP -met with patient and a friend of his- he would like to transition to comfort care and seek hospice". Attending responded , agree, will speak with pt today. TOC will await consult to present pt/family with list of Hospice providers.  -4:20 TOC consult for home hospice. NCM met with pt at bedside to introduce role of TOC/NCM and review for dc planning, pt presented with list of home hospice providers, reports he will review with friends to assist with decision. NCM called to Jeanett Schlein), friend/HCPOA on file, introduced self, requested home hospice list be mailed to her at : [email or web address]  to review with group of friends who assist with pt's care. TOC will continue to follow.       Barriers to Discharge: Continued Medical Work up  Expected Discharge Plan and Services                                               Social Determinants of Health (SDOH) Interventions SDOH Screenings   Food Insecurity: No Food Insecurity (09/16/2023)  Housing: Low Risk  (09/16/2023)  Recent Concern: Housing - High Risk (07/24/2023)  Transportation Needs: No Transportation Needs (09/16/2023)  Utilities: Not At Risk (09/16/2023)  Alcohol Screen: Low Risk  (08/19/2022)  Depression (PHQ2-9): Low Risk  (06/02/2023)  Financial Resource Strain: Low Risk  (06/02/2023)  Physical Activity: Sufficiently Active (08/19/2022)  Social Connections: Moderately Integrated (09/16/2023)  Stress: No Stress Concern Present (08/19/2022)  Tobacco Use: Medium Risk (09/15/2023)  Health Literacy: Adequate Health Literacy (06/02/2023)    Readmission Risk Interventions    09/16/2023    3:12 PM  04/25/2023   11:05 AM  Readmission Risk Prevention Plan  Post Dischage Appt  Complete  Medication Screening  Complete  Transportation Screening Complete Complete  Medication Review Oceanographer) Complete   HRI or Home Care Consult Complete   SW Recovery Care/Counseling Consult Complete   Palliative Care Screening Not Applicable   Skilled Nursing Facility Not Applicable

## 2023-09-20 NOTE — Telephone Encounter (Signed)
 Pt's friend Everitt Amber called to speak with Dr. Mosetta Putt regarding a conversation Maralyn Sago had with pt while visiting him in the hospital.  Maralyn Sago stated the pt stated that Dr. Mosetta Putt came by and said something about the pt's HPOA.  Pt wasn't really sure what Dr. Mosetta Putt said so Maralyn Sago was calling just to get clarification of the conversation Dr. Mosetta Putt had with the pt.  Stated that the pt has a HPOA which is Jeanett Schlein which Maralyn Sago said that's correct so she doesn't understand why Dr. Mosetta Putt would ask the pt about HPOA.  Stated that maybe the pt might have misunderstood what Dr. Mosetta Putt was asking.  Sarah agreed d/t pt's current health status.  Sarah asked if Dr. Mosetta Putt could please give she and the pt a call so they can get clarification regarding the pt's POC.  Stated this nurse will make Dr. Mosetta Putt aware of Sarah's call.

## 2023-09-21 DIAGNOSIS — K922 Gastrointestinal hemorrhage, unspecified: Secondary | ICD-10-CM | POA: Diagnosis not present

## 2023-09-21 DIAGNOSIS — D65 Disseminated intravascular coagulation [defibrination syndrome]: Principal | ICD-10-CM

## 2023-09-21 DIAGNOSIS — D696 Thrombocytopenia, unspecified: Secondary | ICD-10-CM | POA: Diagnosis not present

## 2023-09-21 DIAGNOSIS — D693 Immune thrombocytopenic purpura: Secondary | ICD-10-CM | POA: Diagnosis not present

## 2023-09-21 DIAGNOSIS — D689 Coagulation defect, unspecified: Secondary | ICD-10-CM | POA: Diagnosis not present

## 2023-09-21 DIAGNOSIS — Z8501 Personal history of malignant neoplasm of esophagus: Secondary | ICD-10-CM | POA: Diagnosis not present

## 2023-09-21 DIAGNOSIS — E440 Moderate protein-calorie malnutrition: Secondary | ICD-10-CM

## 2023-09-21 LAB — PREPARE PLATELET PHERESIS: Unit division: 0

## 2023-09-21 LAB — BPAM PLATELET PHERESIS
Blood Product Expiration Date: 202503202359
ISSUE DATE / TIME: 202503191141
Unit Type and Rh: 5100

## 2023-09-21 MED ORDER — MORPHINE SULFATE (PF) 4 MG/ML IV SOLN
4.0000 mg | INTRAVENOUS | Status: DC | PRN
  Administered 2023-09-21 (×2): 4 mg via INTRAVENOUS
  Filled 2023-09-21 (×2): qty 1

## 2023-09-21 NOTE — Plan of Care (Signed)
   Problem: Coping: Goal: Level of anxiety will decrease Outcome: Progressing   Problem: Pain Managment: Goal: General experience of comfort will improve and/or be controlled Outcome: Progressing

## 2023-09-21 NOTE — Progress Notes (Signed)
 Daily Progress Note   Patient Name: Samuel [name]       Date: 09/21/2023 DOB: 1945-12-02  Age: 78 y.o. MRN#: [id number] Attending Physician: Kathlen Mody, MD Primary Care Physician: [name], MD Admit Date: 09/15/2023  Reason for Consultation/Follow-up: Establishing goals of care  Patient Profile/HPI: 78 y.o. male  with past medical history of esophageal cancer status post chemoradiation - (per review of most recent oncology note-there were concerns of bone marrow involvement of cancer causing DIC, the plan was for repeat PET scan and bone marrow biopsy this had not yet been completed prior to admission), thrombocytopenia, PE/DVT, heart failure with EF of 40 to 45%, admitted on 09/15/2023 with melena an leucocytosis.  He was started on empiric antibiotics, no infection source has been identified.  He has also been anemic.  GI was also consulted and they did not feel this was GI related bleeding.  Palliative medicine consulted for goals of care.   Subjective: Chart reviewed.  Discussed care with patient's bedside RN.  She reports patient is more lethargic today, he was also coughing/vomiting up blood this morning.  There is bright red blood in the suction can.  RN also reports he is hallucinating. On evaluation Samuel [name] does wake up, his speech is garbled and he is tangential at times.  His friend Trula Ore is at the bedside as well as Efraim Kaufmann, the hospice liaison for [employer]. He was reporting some pain and discomfort.  This was relieved with IV morphine as well as repositioning. We discussed his tube feeding.  Hospice liaison expressed concern that he may not qualify for inpatient hospital with tube feeding going. He notes he would like to continue his tube feeding for now, however he  was on exam unable to express his reasoning and required reorientation as to his current situation that was done by his friend [place].  He does seem a little more confused today than he was yesterday. I met with Trula Ore a little later, patient was asleep.  She agrees that when patient is sleeping more than he is awake it would be appropriate to stop tube feeding which was in line with his advance directives that state no artificial nutrition when he is in a terminal state.  Trula Ore reports his HCPOA is also in agreement with this plan.  Review of Systems  Constitutional:  Positive for malaise/fatigue and weight loss.  Respiratory:  Positive for cough.   Gastrointestinal:  Positive for vomiting.  Psychiatric/Behavioral:  Positive for hallucinations.      Physical Exam Vitals and nursing note reviewed.  Constitutional:      Appearance: He is ill-appearing.  Cardiovascular:     Comments: All toes on left foot are dusky, weak pedal pulses Skin:    Comments: Diffuse anasarca diffuse bruising  Neurological:     Mental Status: He is disoriented.             Vital Signs: BP 97/73 (BP Location: Left Arm)   Pulse (!) 117   Temp 97.6 F (36.4 C) (Oral)   Resp (!) 22   Ht 5\' 6"  (1.676 m)   Wt 72 kg   SpO2 96%   BMI 25.62 kg/m  SpO2: SpO2: 96 % O2 Device: O2 Device: Room Air O2 Flow Rate: O2 Flow Rate (L/min): 2 L/min  Intake/output summary:  Intake/Output Summary (Last 24 hours) at 09/21/2023 1109 Last data filed at 09/21/2023 0400 Gross per 24 hour  Intake 937.75 ml  Output 600 ml  Net 337.75 ml   LBM: Last BM Date : 09/20/23 Baseline Weight: Weight: 73 kg Most recent weight: Weight: 72 kg       Palliative Assessment/Data: PPS 10%      Patient Active Problem List   Diagnosis Date Noted   Malnutrition of moderate degree 09/19/2023   Coagulopathy (HCC) 09/15/2023   Acute GI bleeding 09/15/2023   Thrombocytopenia (HCC) 09/12/2023   Cellulitis 07/19/2023    Impacted cerumen of both ears 07/12/2023   Conductive hearing loss, bilateral 07/12/2023   Port-A-Cath in place 07/03/2023   Acute prerenal azotemia 05/23/2023   Chronic diastolic CHF (congestive heart failure) (HCC) 05/23/2023   History of pulmonary embolism 05/23/2023   Generalized weakness 05/22/2023   Protein-calorie malnutrition, severe 04/22/2023   Malignant neoplasm of esophagus (HCC) 04/20/2023   AV block, Mobitz 2 04/19/2023   Bradycardia 04/18/2023   Dysphagia 04/14/2023   Dehydration 04/14/2023   LFT elevation 04/14/2023   Acute pulmonary embolism (HCC) 04/14/2023   Swelling of clavicular region 04/14/2023   Pulmonary embolism (HCC) 04/14/2023   Troponin I above reference range 04/14/2023   DM2 (diabetes mellitus, type 2) (HCC) 12/19/2022   Primary hypertension 02/01/2022   Preventative health care 06/15/2015   Psoriasis 06/15/2015    Palliative Care Assessment & Plan    Assessment/Recommendations/Plan  Morphine IV 4 mg q. to 15 minutes as needed for any pain or discomfort or air hunger Continue other comfort measures as needed Appreciate [facility] liaison and TOC assistance with hospice and disposition planning   Code Status:   Code Status: Do not attempt resuscitation (DNR) - Comfort care   Prognosis:  < 2 weeks  Discharge Planning: To Be Determined  Care plan was discussed with patient, patient's friend Trula Ore, and care team  Thank you for allowing the Palliative Medicine Team to assist in the care of this patient.  Total time: 80 minutes Prolonged billing:  Time includes:   Preparing to see the patient (e.g., review of tests) Obtaining and/or reviewing separately obtained history Performing a medically necessary appropriate examination and/or evaluation Counseling and educating the patient/family/caregiver Ordering medications, tests, or procedures Referring and communicating with other health care professionals (when not reported  separately) Documenting clinical information in the electronic or other health record Independently interpreting results (not reported separately) and communicating results to the patient/family/caregiver Care coordination (not  reported separately) Clinical documentation  Ocie Bob, AGNP-C Palliative Medicine   Please contact Palliative Medicine Team phone at [phone] for questions and concerns.

## 2023-09-21 NOTE — Progress Notes (Signed)
 Pt transported via [facility] to [facility] at 1615 on stretcher. Report given to the receiving nurse Byrd Hesselbach. Pt was comfortable, no s/s of pain/distress when leaving. Pt accompanied by friend.

## 2023-09-21 NOTE — Progress Notes (Incomplete)
 [facility]                                                                               Samuel [name], is a 78 y.o. male, DOB - 11-22-1945, WUJ:[id number] Admit date - 09/15/2023    Outpatient Primary MD for the patient is [name], MD  LOS - 6  days    Brief summary   78 years old male with PMH significant for esophageal cancer s/p chemoradiation completed in December 2024, thrombocytopenia, H/O PE/DVT, HFrEF with EF 40 to 45% and grade 1 diastolic dysfunction who was admitted on 3/14 when he presented with melena and leukocytosis.  Patient had been noted to have decline in functional status as an outpatient for the Last month.  Patient was started on vancomycin, cefepime and Zosyn for suspected infection and GI was consulted.  CTA abdomen pelvis was negative for source of bleeding.   Of note, patient's Eliquis had been discontinued last week as an outpatient by Dr. Parke Poisson due to worsening thrombocytopenia.  Patient's thrombocytopenia has been getting worse.  Today's platelets are around 14,000. 1 unit of platelets ordered.   Assessment & Plan    Assessment and Plan:   Acute blood loss anemia /melena prior to admission: Hemoglobin on admission was 13 and slowly trended down to 8.4 improved to 10 after prbc transfusions.  GI  consulted and recommended no plans for endoscopy in view of his thrombocytopenia.  Patient remains on IV PPI BID and hemoglobin remains stable around 10.  Continue with sucralfate 1 g every 6 hours.    H/o PE and DVT Was on eliquis, which is on hold for thrombocytopenia.    Hypophosphatemia:  Replaced.     Acute Thrombocytopenia:  S/p 3 units of FFP and 3 units of platelets so far.  Nplate infusion this morning.  Monitor for bleeding and melena.  Oncology on board.   Leukocytosis: No clear source of infection. He was empirically started on vancomycin and cefepime. Urine and blood cultures are negative to date. Now on  ceftriaxone from 3/14. It could be due to steroids.   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 PEG in place for nutrition. recent PET 08/03/23 showing hypermetabolic lesion distal esophagus; He had tele eval with CTS, Dr. 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  Oncology concerned for cancer dissemination possibly causing DIC and BM biopsy has been recommended to further investigate.   GOC.  Palliative care consulted for goc.    RN Pressure Injury Documentation: Pressure Injury 09/15/23 Sacrum Mid Stage 2 -  Partial thickness loss of dermis presenting as a shallow open injury with a red, pink wound bed without slough. Redness, small open area, (Active)  09/15/23 1800  Location: Sacrum  Location Orientation: Mid  Staging: Stage 2 -  Partial thickness loss of dermis presenting as a shallow open injury with a red, pink wound bed without slough.  Wound Description (Comments): Redness, small open area,  Present on Admission: Yes  Dressing Type Foam - Lift dressing to assess site every shift 09/21/23 0800    Malnutrition Type:  Nutrition Problem: Moderate Malnutrition Etiology: chronic  illness   Malnutrition Characteristics:  Signs/Symptoms: moderate fat depletion, moderate muscle depletion   Nutrition Interventions:  Interventions: Refer to RD note for recommendations, Tube feeding  Estimated body mass index is 25.62 kg/m as calculated from the following:   Height as of this encounter: 5\' 6"  (1.676 m).   Weight as of this encounter: 72 kg.  Code Status:  DVT Prophylaxis:     Level of Care: Level of care: Palliative Care Family Communication: none at bedside.   Disposition Plan:     Remains inpatient appropriate:  pending.   Procedures:  None.   Consultants:   Oncology PCCM.   Antimicrobials:   Anti-infectives (From admission, onward)    Start     Dose/Rate Route  Frequency Ordered Stop   09/16/23 1000  vancomycin (VANCOREADY) IVPB 1500 mg/300 mL  Status:  Discontinued        1,500 mg 150 mL/hr over 120 Minutes Intravenous Every 24 hours 09/15/23 1743 09/16/23 0951   09/15/23 2200  cefTRIAXone (ROCEPHIN) 2 g in sodium chloride 0.9 % 100 mL IVPB  Status:  Discontinued        2 g 200 mL/hr over 30 Minutes Intravenous Every 24 hours 09/15/23 1732 09/19/23 1318   09/15/23 1600  ceFEPIme (MAXIPIME) 2 g in sodium chloride 0.9 % 100 mL IVPB        2 g 200 mL/hr over 30 Minutes Intravenous  Once 09/15/23 1546 09/15/23 1702   09/15/23 1600  metroNIDAZOLE (FLAGYL) IVPB 500 mg        500 mg 100 mL/hr over 60 Minutes Intravenous  Once 09/15/23 1546 09/15/23 1744   09/15/23 1600  vancomycin (VANCOCIN) IVPB 1000 mg/200 mL premix        1,000 mg 200 mL/hr over 60 Minutes Intravenous  Once 09/15/23 1546 09/15/23 1755        Medications  Scheduled Meds:  sodium chloride   Intravenous Once   Chlorhexidine Gluconate Cloth  6 each Topical Daily   feeding supplement (PROSource TF20)  60 mL Per Tube Daily   pantoprazole (PROTONIX) IV  40 mg Intravenous Q12H   Continuous Infusions:  feeding supplement (OSMOLITE 1.5 CAL) 55 mL/hr at 09/21/23 0600   PRN Meds:.gabapentin, glycopyrrolate **OR** glycopyrrolate **OR** glycopyrrolate, haloperidol **OR** haloperidol **OR** haloperidol lactate, LORazepam **OR** LORazepam **OR** LORazepam, Muscle Rub, ondansetron (ZOFRAN) IV, oxyCODONE, polyvinyl alcohol, traZODone    Subjective:   Samuel [name] was seen and examined today.  Pt reports not feeling great.   Objective:   Vitals:   09/21/23 0438 09/21/23 0500 09/21/23 0600 09/21/23 0800  BP:      Pulse: 73 (!) 116 (!) 114 (!) 117  Resp: (!) 23 (!) 21 (!) 22 (!) 22  Temp:      TempSrc:      SpO2: 94% 94% 93% 96%  Weight:      Height:        Intake/Output Summary (Last 24 hours) at 09/21/2023 0941 Last data filed at 09/21/2023 0400 Gross per 24 hour  Intake  1010.25 ml  Output 600 ml  Net 410.25 ml   Filed Weights   09/18/23 0702 09/19/23 0702 09/20/23 0500  Weight: 70.4 kg 72.1 kg 72 kg     Exam General exam: ill appearing gentleman,  Respiratory system: Clear to auscultation. Respiratory effort normal. Cardiovascular system: S1 & S2 heard, RRR. No JVD, Gastrointestinal system: Abdomen is nondistended, soft and nontender. G tube in place.  Central nervous system: Alert and oriented. Extremities: no  edema.  Skin: No rashes,  Psychiatry:  Mood & affect appropriate.     Data Reviewed:  I have personally reviewed following labs and imaging studies   CBC Lab Results  Component Value Date   WBC 21.5 (H) 09/20/2023   RBC 3.23 (L) 09/20/2023   HGB 10.1 (L) 09/20/2023   HCT 33.1 (L) 09/20/2023   MCV 102.5 (H) 09/20/2023   MCH 31.3 09/20/2023   PLT 14 (LL) 09/20/2023   MCHC 30.5 09/20/2023   RDW 16.5 (H) 09/20/2023   LYMPHSABS 0.5 (L) 09/20/2023   MONOABS 2.4 (H) 09/20/2023   EOSABS 0.7 (H) 09/20/2023   BASOSABS 0.2 (H) 09/20/2023     Last metabolic panel Lab Results  Component Value Date   NA 141 09/20/2023   K 3.9 09/20/2023   CL 109 09/20/2023   CO2 24 09/20/2023   BUN 31 (H) 09/20/2023   CREATININE 0.76 09/20/2023   GLUCOSE 145 (H) 09/20/2023   GFRNONAA >60 09/20/2023   GFRAA 83 06/15/2015   CALCIUM 7.9 (L) 09/20/2023   PHOS 2.4 (L) 09/20/2023   PROT 4.3 (L) 09/20/2023   ALBUMIN 2.1 (L) 09/20/2023   LABGLOB 2.3 08/09/2022   AGRATIO 1.8 08/09/2022   BILITOT 0.6 09/20/2023   ALKPHOS 82 09/20/2023   AST 31 09/20/2023   ALT 26 09/20/2023   ANIONGAP 8 09/20/2023    CBG (last 3)  Recent Labs    09/20/23 0405 09/20/23 0745 09/20/23 1209  GLUCAP 182* 127* 129*      Coagulation Profile: Recent Labs  Lab 09/17/23 0244 09/17/23 0245 09/18/23 0416 09/19/23 0623 09/20/23 0701  INR 2.0* 1.9* 2.0* 2.4* 2.3*     Radiology Studies: No results found.     Kathlen Mody M.D. [facility] 09/21/2023, 9:41 AM  Available via Epic secure chat 7am-7pm After 7 pm, please refer to night coverage provider listed on amion.   Addendum:  Patient was seen by palliative care, after further discussions with the patient and patient's friend, he was transitioned to comfort measures.

## 2023-09-21 NOTE — Progress Notes (Signed)
 Nutrition Brief Note  Chart reviewed. Pt now transitioning to comfort care.  Per palliative care note yesterday, plan to continue tube feeds until patient either no longer awake or the patient decides they are not contributing to his comfort/quality of life. No other nutrition interventions planned at this time.  Will sign off. Please re-consult as needed.   Shelle Iron RD, LDN Contact via [employer].

## 2023-09-21 NOTE — Plan of Care (Signed)
   Problem: Nutrition: Goal: Adequate nutrition will be maintained Outcome: Progressing   Problem: Coping: Goal: Level of anxiety will decrease Outcome: Progressing   Problem: Pain Managment: Goal: General experience of comfort will improve and/or be controlled Outcome: Progressing

## 2023-09-21 NOTE — Discharge Summary (Signed)
 Physician Discharge Summary   Patient: Samuel [name] MRN: [id number] DOB: [birth year]  Admit date:     09/15/2023  Discharge date: 09/21/23  Discharge Physician: Kathlen Mody   PCP: [name], MD   Recommendations at discharge:  Please follow up with hospice MD as needed.   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  Patient had been noted to have decline in functional status as an outpatient for the Last month.  Patient was started on vancomycin, cefepime and Zosyn for suspected infection and GI was consulted.  CTA abdomen pelvis was negative for source of bleeding.   Of note, patient's Eliquis had been discontinued last week as an outpatient by Dr. Parke Poisson due to worsening thrombocytopenia.  Patient's thrombocytopenia has been getting worse.  Today's platelets are around 14,000. 1 unit of platelets ordered.   Patient was seen by palliative care, after further discussions with the patient and patient's friend, he was transitioned to comfort measures.     Assessment and Plan:    Acute blood loss anemia /melena prior to admission: Hemoglobin on admission was 13 and slowly trended down to 8.4 improved to 10 after prbc transfusions.  GI  consulted and recommended no plans for endoscopy in view of his thrombocytopenia.     H/o PE and DVT Was on eliquis, which is on hold for thrombocytopenia.     Hypophosphatemia:  Replaced.        Acute Thrombocytopenia:  S/p 3 units of FFP and 3 units of platelets so far.  Nplate infusion done, without much response.    Leukocytosis: No clear source of  infection. He was empirically started on vancomycin and cefepime. Urine and blood cultures are negative to date.    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 PEG in place recent PET 08/03/23 showing hypermetabolic lesion distal esophagus; He had tele eval with CTS, Dr. 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       GOC.  Palliative care consulted for goc.  He was transitioned to comfort measures as per his wishes.       Consultants: palliative care Procedures performed: none.   Disposition: Hospice care Diet recommendation:  Discharge Diet Orders (From admission, onward)     Start     Ordered   09/21/23 0000  Diet - low sodium heart healthy        09/21/23 1442            DISCHARGE MEDICATION: Allergies as of 09/21/2023       Reactions   Penicillins Shortness Of Breath      Sulfa Antibiotics    Blisters   Acetaminophen Other (See Comments)   Caused problems with urinary tract   Procaine Other (See Comments)   Caused fatigue        Medication List     STOP taking these medications    acetaminophen 160 MG/5ML solution Commonly known as: TYLENOL   dexamethasone 4 MG tablet Commonly known as: DECADRON   free water Soln   hydrocortisone cream 0.5 %   lactulose 10 GM/15ML solution Commonly known as: CHRONULAC  ondansetron 4 MG disintegrating tablet Commonly known as: ZOFRAN-ODT   ondansetron 4 MG tablet Commonly known as: Zofran   OSMOLITE 1.5 CAL PO   oxyCODONE 5 MG immediate release tablet Commonly known as: Oxy IR/ROXICODONE   pantoprazole sodium 40 mg Commonly known as: Protonix   PRESCRIPTION MEDICATION   PROSOURCE TF PO   Senokot 8.6 MG tablet Generic drug: senna        Discharge Exam: Filed Weights   09/18/23 0702 09/19/23 0702 09/20/23 0500  Weight: 70.4 kg 72.1 kg 72 kg   Ill appearing elderly gentleman,not in distress.    Respiratory system:  Clear to auscultation.  Cardiovascular system: S1 & S2 heard, RRR. Gastrointestinal system: Abdomen is nondistended, soft and nontender. G tube in place.   Condition at discharge: fair  The results of significant diagnostics from this hospitalization (including imaging, microbiology, ancillary and laboratory) are listed below for reference.   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 Black tarry stool. History of esophageal cancer. * Tracking Code: BO * EXAM: CTA ABDOMEN AND PELVIS WITHOUT AND WITH CONTRAST TECHNIQUE: Multidetector CT imaging of the abdomen and pelvis was performed using the standard protocol during bolus administration of intravenous contrast. Multiplanar reconstructed images and MIPs were obtained and reviewed to evaluate the vascular anatomy. RADIATION DOSE REDUCTION: This exam was performed according to the departmental dose-optimization program which includes automated exposure control, adjustment of the mA and/or kV according to patient size and/or use of iterative reconstruction technique. CONTRAST:  OMNIPAQUE IOHEXOL 350 MG/ML SOLN COMPARISON:  CT scan abdomen and pelvis from 04/19/2023. FINDINGS: VASCULAR Aorta: Normal caliber aorta without aneurysm, dissection, vasculitis or significant stenosis. Celiac: Patent without evidence of aneurysm, dissection, vasculitis or significant stenosis. SMA: Patent without evidence of aneurysm, dissection, vasculitis or significant stenosis. Note is made of small area of a fat stranding in the left upper abdomen mesentery (series 6, images 55-58), which contains several 1-2 mm diameter sized vessels within. The area is supplied by the SMA/SMV branches (jejunal and ileal branches). This is new since the prior CT scan from 04/19/2023. No associated adjacent abnormal bowel wall thickening or focal mass. 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 However, this is most likely not a cause of patient's GI bleeding. Renals: Both renal arteries are patent without evidence of aneurysm, dissection, vasculitis, fibromuscular dysplasia or significant stenosis. IMA: Patent without evidence of aneurysm, dissection, vasculitis or significant stenosis. Inflow: There is ectasia of the left internal iliac artery measuring up to 9 mm in diameter. The inflow arteries are otherwise patent without evidence of aneurysm, dissection, vasculitis or significant stenosis. Proximal Outflow: Bilateral common femoral and visualized portions of the superficial and profunda femoral arteries are patent without evidence of aneurysm, dissection, vasculitis or significant stenosis. Veins: No obvious venous abnormality within the limitations of this arterial phase study. Review of the MIP images confirms the above findings. NON-VASCULAR Lower chest: There is partially imaged at least trace left pleural effusion with associated pleural thickening. The lung bases are otherwise clear. No left pleural effusion. The heart is normal in size. No pericardial effusion. Hepatobiliary: The liver is normal in size. Non-cirrhotic configuration. No suspicious mass. No intrahepatic or extrahepatic bile duct dilation. Redemonstration of an approximately 5 mm calcification along the dependent wall of the gallbladder at the fold, which may represent adherent gallstone versus focal gallbladder wall calcification. No significant interval change since the prior study. No imaging evidence of  acute cholecystitis. Otherwise normal gallbladder wall. No pericholecystic inflammatory changes. Pancreas: Redemonstration of an approximately 1.1 x 1.6 cm nonenhancing hypoattenuating lesion adjacent to the pancreatic head. The lesion is essentially unchanged since the prior study. No main pancreatic duct dilation. 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 Further evaluation with MRI abdomen as per pancreatic mass protocol is recommended, unless recently performed. Otherwise unremarkable pancreas. No peripancreatic fat stranding. Spleen: Within normal limits. No focal lesion. Adrenals/Urinary Tract: Adrenal glands are unremarkable. No suspicious renal mass. Redemonstration of bilobed predominantly exophytic cyst arising from the left kidney, anteriorly measuring 5.7 x 8.2 cm. No nephroureterolithiasis or obstructive uropathy on either side. Unremarkable urinary bladder. Stomach/Bowel: Note is made of midline gastrostomy tube. No disproportionate dilation of the small or large bowel loops. No evidence of abnormal bowel wall thickening or inflammatory changes. The appendix is unremarkable. There are multiple diverticula mainly in the left hemi colon, without imaging signs of diverticulitis. Vascular/Lymphatic: No ascites or pneumoperitoneum. No abdominal or pelvic lymphadenopathy, by size criteria. No aneurysmal dilation of the major abdominal arteries. There are mild peripheral atherosclerotic vascular calcifications of the aorta and its major branches. Reproductive: Enlarged prostate. Symmetric seminal vesicles. Other: There is a tiny fat containing umbilical hernia. The soft tissues and abdominal wall are otherwise unremarkable. Musculoskeletal: No suspicious osseous lesions. There are mild - moderate multilevel degenerative changes in the visualized spine. IMPRESSION: VASCULAR *There is a small area of fat stranding in the left upper abdomen, which contains several 1-2 mm sized vessels within. 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 NON-VASCULAR *No acute inflammatory process identified within the abdomen or pelvis. *There is a stable, approximately 1.1 x 1.6 cm nonenhancing hypoattenuating lesion adjacent to the pancreatic head. 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 Further evaluation with MRI abdomen as per pancreatic mass protocol is recommended, unless recently performed. *Both multiple other nonacute observations, as described above. Aortic Atherosclerosis (ICD10-I70.0).  Bone Electronically Signed   By: Jules Schick M.D.   On: 09/15/2023 15:13    Microbiology: Results for orders placed or performed during the hospital encounter of 09/15/23  Blood culture (routine x 2)     Status: None   Collection Time: 09/15/23  4:30 PM   Specimen: BLOOD RIGHT HAND  Result Value Ref Range Status   Specimen Description   Final    BLOOD RIGHT HAND Performed at [facility], [address] [place], [place] [address]    Special Requests   Final    BOTTLES DRAWN AEROBIC AND ANAEROBIC Blood Culture results may not be optimal due to an inadequate volume of blood received in culture bottles Performed at [facility], [address] [place], [place] [address]    Culture   Final    NO GROWTH 5 DAYS Performed at [facility], [address] [place], [place] [address]    Report Status 09/20/2023 FINAL  Final  Blood culture (routine x 2)     Status: None   Collection Time: 09/15/23  4:35 PM   Specimen: BLOOD LEFT ARM  Result Value Ref Range Status   Specimen Description   Final    BLOOD LEFT ARM Performed at [facility], [address] [place], [place] [address]    Special Requests   Final    BOTTLES DRAWN AEROBIC AND ANAEROBIC Blood  Culture results may not be optimal due to an inadequate volume of blood received in culture bottles Performed at [facility], [address] [place], [place] [address]    Culture   Final    NO GROWTH 5 DAYS Performed at [facility], [address] [place], [place] [address]    Report Status 09/20/2023 FINAL  Final  MRSA Next Gen by PCR, Nasal     Status: None   Collection Time: 09/15/23  8:43 PM   Specimen: Nasal  Mucosa; Nasal Swab  Result Value Ref Range Status   MRSA by PCR Next Gen NOT DETECTED NOT DETECTED Final    Comment: (NOTE) The GeneXpert MRSA Assay (FDA approved for NASAL specimens only), is one component of a comprehensive MRSA colonization surveillance program. It is not intended to diagnose MRSA infection nor to guide or monitor treatment for MRSA infections. Test performance is not FDA approved in patients less than 35 years old. Performed at [facility], [address] [place], [place] [address]     Labs: CBC: Recent Labs  Lab 09/17/23 0244 09/17/23 0245 09/18/23 0416 09/19/23 0218 09/19/23 0623 09/20/23 0701 09/20/23 0834  WBC 13.4*   < > 20.2* 20.2* 20.1* 20.5* 21.5*  NEUTROABS 10.5*  --  18.6*  --  15.3* 14.8* 15.5*  HGB 8.4*   < > 11.2* 10.7* 10.1* 9.8* 10.1*  HCT 27.9*   < > 37.5* 34.4* 33.8* 31.9* 33.1*  MCV 103.3*   < > 103.6* 99.7 103.4* 101.3* 102.5*  PLT 28*   < > 34* 23* 23* 15* 14*   < > = values in this interval not displayed.   Basic Metabolic Panel: Recent Labs  Lab 09/18/23 0416 09/18/23 1630 09/19/23 0218 09/19/23 0623 09/19/23 1903 09/20/23 0701 09/20/23 0834  NA 141  --  142 142  --  140 141  K 3.9  --  3.6 3.6  --  3.7 3.9  CL 109  --  112* 113*  --  110 109  CO2 22  --  22 22  --  24 24  GLUCOSE 167*  --  158* 164*  --  162* 145*  BUN 23  --  32* 32*  --  31* 31*  CREATININE 0.83  --  0.79 0.75  --  0.73 0.76  CALCIUM 8.1*  --  8.4* 8.1*  --  7.9* 7.9*  MG 2.3 2.0 2.0 2.3 2.1 2.2  --   PHOS  --  1.8*  --  1.8* 2.7 2.4*  --    Liver Function Tests: Recent Labs  Lab 09/17/23 0244 09/18/23 0416 09/19/23 0218 09/19/23 0623 09/20/23 0701  AST 57* 44* 36 32 31  ALT 33 32 29 25 26   ALKPHOS 63 71 79 68 82  BILITOT 0.7 1.2 1.4* 0.9 0.6  PROT 4.7* 4.9* 4.6* 4.2* 4.3*  ALBUMIN 2.5* 2.6* 2.3* 2.1* 2.1*   CBG: Recent Labs  Lab 09/19/23 1944 09/19/23 2349 09/20/23 0405 09/20/23 0745 09/20/23 1209  GLUCAP  169* 149* 182* 127* 129*    Discharge time spent: 32 minutes.   Signed: Kathlen Mody, MD [facility] 09/21/2023

## 2023-09-21 NOTE — Progress Notes (Signed)
 [id number] Liaison Note  Received request from [facility] manager for family interest in [facility]. Eligibility confirmed. Met with patient and friends to confirm interest and explain services. Patient and friends agreeable to transfer today. TOC aware. RN please call report to [phone] prior to patient leaving the unit. Please send signed DNR with patient at discharge.   Thank you for allowing [place] to participate in this patient's care.  Henderson Newcomer, LPN [facility] Liaison [phone]

## 2023-09-21 NOTE — TOC Progression Note (Addendum)
 Transition of Care [facility]) - Progression Note    Patient Details  Name: Samuel [name] MRN: [id number] Date of Birth: 11-09-1945  Transition of Care [facility]) CM/SW Contact  Howell Rucks, RN Phone Number: 09/21/2023, 10:39 AM  Clinical Narrative:  Teams chat received from pt's bedside nurse and palliative medicine team, pt's medical status has changed, Trula Ore (friend) inquiring about residential hospice. [facility] selected by pt's friends/caregivers. Teams chat sent to Glenna Fellows for residential hospice evaluation. TOC will continue to follow.    -12:37pm Patient has transitioned to [facility].      Barriers to Discharge: Continued Medical Work up  Expected Discharge Plan and Services                                               Social Determinants of Health (SDOH) Interventions SDOH Screenings   Food Insecurity: No Food Insecurity (09/16/2023)  Housing: Low Risk  (09/16/2023)  Recent Concern: Housing - High Risk (07/24/2023)  Transportation Needs: No Transportation Needs (09/16/2023)  Utilities: Not At Risk (09/16/2023)  Alcohol Screen: Low Risk  (08/19/2022)  Depression (PHQ2-9): Low Risk  (06/02/2023)  Financial Resource Strain: Low Risk  (06/02/2023)  Physical Activity: Sufficiently Active (08/19/2022)  Social Connections: Moderately Integrated (09/16/2023)  Stress: No Stress Concern Present (08/19/2022)  Tobacco Use: Medium Risk (09/15/2023)  Health Literacy: Adequate Health Literacy (06/02/2023)    Readmission Risk Interventions    09/16/2023    3:12 PM 04/25/2023   11:05 AM  Readmission Risk Prevention Plan  Post Dischage Appt  Complete  Medication Screening  Complete  Transportation Screening Complete Complete  Medication Review Ocean[employer]) Complete   HRI or Home Care Consult Complete   SW Recovery Care/Counseling Consult Complete   Palliative Care Screening Not Applicable   Skilled Nursing Facility Not Applicable

## 2023-09-25 ENCOUNTER — Ambulatory Visit: Admitting: Radiology

## 2023-09-26 LAB — MOLECULAR PATHOLOGY

## 2023-09-27 ENCOUNTER — Inpatient Hospital Stay

## 2023-10-03 ENCOUNTER — Ambulatory Visit: Payer: 59 | Admitting: Family Medicine

## 2023-10-03 DEATH — deceased

## 2023-10-04 ENCOUNTER — Ambulatory Visit

## 2023-10-04 ENCOUNTER — Ambulatory Visit: Admitting: Hematology

## 2023-10-04 ENCOUNTER — Other Ambulatory Visit
